# Patient Record
Sex: Male | Born: 1970 | Race: White | Hispanic: No | Marital: Married | Smoking: Never smoker
Health system: Southern US, Community
[De-identification: ages and names within clinical notes are randomized; demographics above are authoritative.]

## PROBLEM LIST (undated history)

## (undated) DIAGNOSIS — M1990 Unspecified osteoarthritis, unspecified site: Secondary | ICD-10-CM

## (undated) DIAGNOSIS — C801 Malignant (primary) neoplasm, unspecified: Secondary | ICD-10-CM

## (undated) DIAGNOSIS — I10 Essential (primary) hypertension: Secondary | ICD-10-CM

## (undated) DIAGNOSIS — E119 Type 2 diabetes mellitus without complications: Secondary | ICD-10-CM

## (undated) DIAGNOSIS — E7800 Pure hypercholesterolemia, unspecified: Secondary | ICD-10-CM

## (undated) DIAGNOSIS — Z809 Family history of malignant neoplasm, unspecified: Secondary | ICD-10-CM

## (undated) HISTORY — PX: HERNIA REPAIR: SHX51

## (undated) HISTORY — PX: TONSILLECTOMY: SUR1361

## (undated) HISTORY — PX: EYE SURGERY: SHX253

## (undated) HISTORY — DX: Malignant (primary) neoplasm, unspecified: C80.1

## (undated) HISTORY — DX: Family history of malignant neoplasm, unspecified: Z80.9

## (undated) HISTORY — PX: SMALL INTESTINE SURGERY: SHX150

## (undated) NOTE — *Deleted (*Deleted)
[facility]   [phone] Fax:([phone]   Clinic Follow up Note   Patient Care Team: Cain Saupe, MD as PCP - General (Family Medicine)  Date of Service:  05/19/2020  CHIEF COMPLAINT: F/u neuroendocrine tumor  SUMMARY OF ONCOLOGIC HISTORY: Oncology History Overview Note  Cancer Staging No matching staging information was found for the patient.    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 There is a solid mass in the right lobe of the liver which based on current measurements may have enlarged since the prior study from 2015. Images from the previous MR at that time cannot be retrieved. Given this circumstance, it may be prudent to correlate with pre and serial post-contrast MR or CT of the liver to further evaluate. There is underlying diffuse increase in liver echogenicity, a finding indicative of hepatic steatosis.   2. Multiple loops of fluid-filled bowel. Question a degree of ileus or enteritis.   3. Study otherwise unremarkable. Note that much of the common bile duct is obscured by gas.    06/11/2019 Imaging   CT AP W Contrast 06/11/19   IMPRESSION: 1. High-grade small bowel obstruction secondary to desmoplastic response in the central right abdominal mesentery centered on a 4 cm calcified irregular/spiculated soft tissue lesion. Abrupt small bowel transition zone is identified immediately lead adjacent to this mesenteric lesion and multiple adjacent bowel loops are tethered into this region with mesenteric edema/congestion. The loop of small bowel immediately proximal to the transition zone shows mild circumferential wall thickening but no pneumatosis. 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 2. Small lymph nodes in the abnormal right mesentery suggest additional  metastatic involvement. 3. Heterogeneous liver parenchyma, likely secondary to geographic fatty deposition. There is a focal 14 mm low-density lesion in the right liver. The patient had an MRI in the [facility] system on 08/09/2013 to evaluate a right liver lesion, but those images are not available. Follow-up MRI without and with contrast recommended to exclude metastatic disease. 4. Tiny sclerotic foci in the T12 vertebral body in both femoral heads are likely benign, but close attention on follow-up recommended. 5.  Aortic Atherosclerois (ICD10-170.0)      06/12/2019 Surgery    EXPLORATORY LAPAROTOMY WITH SMALL BOWEL RESECTION by Dr. 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 Interval resection small bowel and mesenteric mass with no evidence residual mesenteric or small bowel neuroendocrine tumor. 2. Large lesion in RIGHT hepatic lobe with intense radiotracer activity consistent with well differentiated neuroendocrine tumor metastasis. Smaller lesion in the anterior LEFT hepatic lobe is concerning for second hepatic metastasis. 3. Pulmonary nodule measuring 6 mm in LEFT upper lobe is not have radiotracer activity. Smaller RIGHT upper lobe nodule. Recommend close attention on follow-up.   08/01/2019 -  Chemotherapy   Lanreotide injection monthly starting 08/01/19    08/04/2019 Imaging  MRI abdomen  IMPRESSION: 1. The previously noted lesion in  the right lobe of the liver has grown slightly compared to 2015. In addition, today's study demonstrates 2 smaller lesions with similar imaging characteristics. Given the activity on the recent PET Dotatate scan, these lesions are all compatible with small neuroendocrine metastatic lesions. 2. Hepatic steatosis.   02/10/2020 Imaging   MRI abdomen  IMPRESSION: 1. The dominant right hepatic lobe mass has mildly increased in size compared to the prior exam. This currently measures 5.4 by 4.5 cm, previously 4.9 by 4.2 cm. 2. Nine additional small T2 hyperintense foci in the liver are stable, and while the small size makes these less specific, there likely small metastatic lesions. Today's exam has the benefit of less motion artifact compared to the 06/03/2020 MRI, and I was able to pick at each of these tiny lesions on the prior exam in retrospect. It is conceivable that 1 or more of these tiny lesions could represent small hemangiomas, although I do not observe classic delayed enhancement pattern. 3. Diffuse hepatic steatosis.      CURRENT THERAPY:  Lanreotide injection monthly starting 08/01/19  INTERVAL HISTORY: *** Dwayne Sawyer is here for a follow up. He presents to the clinic alone.    REVIEW OF SYSTEMS:  *** Constitutional: Denies fevers, chills or abnormal weight loss Eyes: Denies blurriness of vision Ears, nose, mouth, throat, and face: Denies mucositis or sore throat Respiratory: Denies cough, dyspnea or wheezes Cardiovascular: Denies palpitation, chest discomfort or lower extremity swelling Gastrointestinal:  Denies nausea, heartburn or change in bowel habits Skin: Denies abnormal skin rashes Lymphatics: Denies new lymphadenopathy or easy bruising Neurological:Denies numbness, tingling or new weaknesses Behavioral/Psych: Mood is stable, no new changes  All other systems were reviewed with the patient and are negative.  MEDICAL HISTORY:  Past Medical History:   Diagnosis Date  . Arthritis   . Family history of carcinoid tumor   . High cholesterol   . Hypertension     SURGICAL HISTORY: Past Surgical History:  Procedure Laterality Date  . HERNIA REPAIR    . LAPAROTOMY N/A 06/12/2019   Procedure: EXPLORATORY LAPAROTOMY WITH SMALL BOWEL RESECTION;  Surgeon: Luretha Murphy, MD;  Location: [facility];  Service: General;  Laterality: N/A;  . SHOULDER SURGERY      I have reviewed the social history and family history with the patient and they are unchanged from previous note.  ALLERGIES:  has No Known Allergies.  MEDICATIONS:  Current Outpatient Medications  Medication Sig Dispense Refill  . amLODipine (NORVASC) 10 MG tablet TAKE ONE TABLET BY MOUTH DAILY 90 tablet 1  . atorvastatin (LIPITOR) 10 MG tablet TAKE ONE TABLET BY MOUTH DAILY AT 6:00 IN THE EVENING 30 tablet 3  . carvedilol (COREG) 3.125 MG tablet Take 1 tablet (3.125 mg total) by mouth 2 (two) times daily with a meal. 60 tablet 0  . valsartan-hydrochlorothiazide (DIOVAN-HCT) 160-25 MG tablet TAKE ONE TABLET BY MOUTH DAILY 90 tablet 1   No current facility-administered medications for this visit.    PHYSICAL EXAMINATION: ECOG PERFORMANCE STATUS: {CHL ONC ECOG PS:[id number]}  There were no vitals filed for this visit. There were no vitals filed for this visit. *** GENERAL:alert, no distress and comfortable SKIN: skin color, texture, turgor are normal, no rashes or significant lesions EYES: normal, Conjunctiva are pink and non-injected, sclera clear {OROPHARYNX:no exudate, no erythema and lips, buccal mucosa, and tongue normal}  NECK: supple, thyroid normal size, non-tender, without nodularity LYMPH:  no palpable lymphadenopathy  in the cervical, axillary {or inguinal} LUNGS: clear to auscultation and percussion with normal breathing effort HEART: regular rate & rhythm and no murmurs and no lower extremity edema ABDOMEN:abdomen soft, non-tender and normal bowel sounds  Musculoskeletal:no cyanosis of digits and no clubbing  NEURO: alert & oriented x 3 with fluent speech, no focal motor/sensory deficits  LABORATORY DATA:  I have reviewed the data as listed CBC Latest Ref Rng & Units 05/12/2020 02/06/2020 10/24/2019  WBC 4.0 - 10.5 K/uL 7.2 8.1 7.6  Hemoglobin 13.0 - 17.0 g/dL 40.9 81.1 91.4  Hematocrit 39 - 52 % 42.6 43.9 44.8  Platelets 150 - 400 K/uL 284 339 324     CMP Latest Ref Rng & Units 05/12/2020 02/06/2020 11/14/2019  Glucose 70 - 99 mg/dL 782(N) 562(Z) 98  BUN 6 - 20 mg/dL 20 20 17   Creatinine 0.61 - 1.24 mg/dL 3.08 6.57(Q) 4.69(G)  Sodium 135 - 145 mmol/L 140 138 138  Potassium 3.5 - 5.1 mmol/L 4.3 3.8 4.1  Chloride 98 - 111 mmol/L 105 102 99  CO2 22 - 32 mmol/L 27 25 24   Calcium 8.9 - 10.3 mg/dL 9.3 9.9 9.8  Total Protein 6.5 - 8.1 g/dL 7.6 8.1 -  Total Bilirubin 0.3 - 1.2 mg/dL 0.4 0.6 -  Alkaline Phos 38 - 126 U/L 58 77 -  AST 15 - 41 U/L 16 15 -  ALT 0 - 44 U/L 29 26 -      RADIOGRAPHIC STUDIES: I have personally reviewed the radiological images as listed and agreed with the findings in the report. No results found.   ASSESSMENT & PLAN:  Tamas Suen is a 75 y.o. male with   1. 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 Pathology confirmed well differentiated neuroendocrine tumor, with metastasis to 14 of 32 LNs. Margins clear. -07/16/19 PETshowstworight hepatic lesion which areintensely hypermetabolic consistent with metastatic NET. There is also a smaller less avid left hepatic lesion that is concerning for metastasis which was not seen on CT from 06/11/19.No other evidence of mets -His MRI abdomen from 08/04/19 shows 3 liver lesions (0.8-4.4cm) compatible with neuroendocrine metastatic lesions.  -He was seen by Dr. Donell Beers and she did not recommend surgery at this time due to the multiple liver  mets.  -I started him on monthly Lanreotide injections on 08/01/19. Will continue injections to treat and control his disease. If he responds well he may be able to do liver ablation down the road. I briefly discussed option treatment of Lutathera as a later line of treatment.  -I personally reviewed and discussed the MRI abdomen from 02/10/20 which shows mild progression of right liver lobe lesions and stable 9 additional T2 hyperintense foci in the liver some could be benign lesions. I discussed there is still stable disease per radiographic criteria, although this likely represents mild preogression. I recommend repeating DOTATATE PET for further evaluation as the next scan in 3 months.  -With further disease progression, I will recommend next line treatment with monthly Lutathera treatments X4 then proceeding with observation, or afinitor. I discussed Lutathera is not curative, so with the following disease progression I will discuss oral target therapy options at that time.  -Labs reviewed and adequate to proceed with lanreotide injections today. Continue monthly -f/u in 3 months with scan results.   3. RA -previously on methotrexate and enbrel -not currently on therapy   4.  Mild Diarrhea  -Secondary to lanreotide injections  -Irfan has had loose mouse-like stool since surgery and has occasional diarrhea at least 3 times a day. Overall manageable.  -I recommend increased water intake and imodium if needed.   5. Uncontrolled hypertension, Hyperglycemia and CKD -Cr has been increasing, likely related to hyperglycemia and uncontrolled HTN.  -BG is 236 today and BP at 157/95 (02/13/20). I recommend following up with PCP for medication adjustment and to check A1c and Cholesterol as this can indicate Diabetes  6. Transgender and estrogen use -Trejan has started estrogen with Waldron Session, NP with goal of transgender transitioning.  -I checked his neuroendocrine ER and PR which were negative, so  there is no contraindication for estrogen use. Can continue    PLAN: -MRI scanreviewed -Proceed with injection today  -Lab and injection monthly X3 -f/u in 3 months with lab and DOTATATE PET a few days before  -will copy his PCP about his HTN issue    No problem-specific Assessment & Plan notes found for this encounter.   No orders of the defined types were placed in this encounter.  All questions were answered. The patient knows to call the clinic with any problems, questions or concerns. No barriers to learning was detected. The total time spent in the appointment was {CHL ONC TIME VISIT - [id number]}.     Delphina Cahill 05/19/2020   Rogelia Rohrer, am acting as scribe for Malachy Mood, MD.   {Add scribe attestation statement}

## (undated) NOTE — *Deleted (*Deleted)
[facility]   [phone] [phone]   Clinic Follow up Note   Patient Care Team: Cain Saupe, MD as PCP - General (Family Medicine)  Date of Service:  05/12/2020  CHIEF COMPLAINT: F/u neuroendocrine tumor  SUMMARY OF ONCOLOGIC HISTORY: Oncology History Overview Note  Cancer Staging No matching staging information was found for the patient.    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 There is a solid mass in the right lobe of the liver which based on current measurements may have enlarged since the prior study from 2015. Images from the previous MR at that time cannot be retrieved. Given this circumstance, it may be prudent to correlate with pre and serial post-contrast MR or CT of the liver to further evaluate. There is underlying diffuse increase in liver echogenicity, a finding indicative of hepatic steatosis.   2. Multiple loops of fluid-filled bowel. Question a degree of ileus or enteritis.   3. Study otherwise unremarkable. Note that much of the common bile duct is obscured by gas.    06/11/2019 Imaging   CT AP W Contrast 06/11/19   IMPRESSION: 1. High-grade small bowel obstruction secondary to desmoplastic response in the central right abdominal mesentery centered on a 4 cm calcified irregular/spiculated soft tissue lesion. Abrupt small bowel transition zone is identified immediately lead adjacent to this mesenteric lesion and multiple adjacent bowel loops are tethered into this region with mesenteric edema/congestion. The loop of small bowel immediately proximal to the transition zone shows mild circumferential wall thickening but no pneumatosis. 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 2. Small lymph nodes in the abnormal right mesentery suggest additional  metastatic involvement. 3. Heterogeneous liver parenchyma, likely secondary to geographic fatty deposition. There is a focal 14 mm low-density lesion in the right liver. The patient had an MRI in the [facility] system on 08/09/2013 to evaluate a right liver lesion, but those images are not available. Follow-up MRI without and with contrast recommended to exclude metastatic disease. 4. Tiny sclerotic foci in the T12 vertebral body in both femoral heads are likely benign, but close attention on follow-up recommended. 5.  Aortic Atherosclerois (ICD10-170.0)      06/12/2019 Surgery    EXPLORATORY LAPAROTOMY WITH SMALL BOWEL RESECTION by Dr. 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 Interval resection small bowel and mesenteric mass with no evidence residual mesenteric or small bowel neuroendocrine tumor. 2. Large lesion in RIGHT hepatic lobe with intense radiotracer activity consistent with well differentiated neuroendocrine tumor metastasis. Smaller lesion in the anterior LEFT hepatic lobe is concerning for second hepatic metastasis. 3. Pulmonary nodule measuring 6 mm in LEFT upper lobe is not have radiotracer activity. Smaller RIGHT upper lobe nodule. Recommend close attention on follow-up.   08/01/2019 -  Chemotherapy   Lanreotide injection monthly starting 08/01/19    08/04/2019 Imaging  MRI abdomen  IMPRESSION: 1. The previously noted lesion in  the right lobe of the liver has grown slightly compared to 2015. In addition, today's study demonstrates 2 smaller lesions with similar imaging characteristics. Given the activity on the recent PET Dotatate scan, these lesions are all compatible with small neuroendocrine metastatic lesions. 2. Hepatic steatosis.   02/10/2020 Imaging   MRI abdomen  IMPRESSION: 1. The dominant right hepatic lobe mass has mildly increased in size compared to the prior exam. This currently measures 5.4 by 4.5 cm, previously 4.9 by 4.2 cm. 2. Nine additional small T2 hyperintense foci in the liver are stable, and while the small size makes these less specific, there likely small metastatic lesions. Today's exam has the benefit of less motion artifact compared to the 06/03/2020 MRI, and I was able to pick at each of these tiny lesions on the prior exam in retrospect. It is conceivable that 1 or more of these tiny lesions could represent small hemangiomas, although I do not observe classic delayed enhancement pattern. 3. Diffuse hepatic steatosis.      CURRENT THERAPY:  Lanreotide injection monthly starting 08/01/19  INTERVAL HISTORY: *** [name] is here for a follow up. He presents to the clinic alone.    REVIEW OF SYSTEMS:  *** Constitutional: Denies fevers, chills or abnormal weight loss Eyes: Denies blurriness of vision Ears, nose, mouth, throat, and face: Denies mucositis or sore throat Respiratory: Denies cough, dyspnea or wheezes Cardiovascular: Denies palpitation, chest discomfort or lower extremity swelling Gastrointestinal:  Denies nausea, heartburn or change in bowel habits Skin: Denies abnormal skin rashes Lymphatics: Denies new lymphadenopathy or easy bruising Neurological:Denies numbness, tingling or new weaknesses Behavioral/Psych: Mood is stable, no new changes  All other systems were reviewed with the patient and are negative.  MEDICAL HISTORY:  Past Medical History:   Diagnosis Date  . Arthritis   . Family history of carcinoid tumor   . High cholesterol   . Hypertension     SURGICAL HISTORY: Past Surgical History:  Procedure Laterality Date  . HERNIA REPAIR    . LAPAROTOMY N/A 06/12/2019   Procedure: EXPLORATORY LAPAROTOMY WITH SMALL BOWEL RESECTION;  Surgeon: Luretha Murphy, MD;  Location: [facility];  Service: General;  Laterality: N/A;  . SHOULDER SURGERY      I have reviewed the social history and family history with the patient and they are unchanged from previous note.  ALLERGIES:  has No Known Allergies.  MEDICATIONS:  Current Outpatient Medications  Medication Sig Dispense Refill  . amLODipine (NORVASC) 10 MG tablet TAKE ONE TABLET BY MOUTH DAILY 90 tablet 1  . atorvastatin (LIPITOR) 10 MG tablet TAKE ONE TABLET BY MOUTH DAILY AT 6:00 IN THE EVENING 30 tablet 3  . carvedilol (COREG) 3.125 MG tablet Take 1 tablet (3.125 mg total) by mouth 2 (two) times daily with a meal. 60 tablet 0  . valsartan-hydrochlorothiazide (DIOVAN-HCT) 160-25 MG tablet TAKE ONE TABLET BY MOUTH DAILY 90 tablet 1   No current facility-administered medications for this visit.    PHYSICAL EXAMINATION: ECOG PERFORMANCE STATUS: {CHL ONC ECOG PS:[id number]}  There were no vitals filed for this visit. There were no vitals filed for this visit. *** GENERAL:alert, no distress and comfortable SKIN: skin color, texture, turgor are normal, no rashes or significant lesions EYES: normal, Conjunctiva are pink and non-injected, sclera clear {OROPHARYNX:no exudate, no erythema and lips, buccal mucosa, and tongue normal}  NECK: supple, thyroid normal size, non-tender, without nodularity LYMPH:  no palpable lymphadenopathy  in the cervical, axillary {or inguinal} LUNGS: clear to auscultation and percussion with normal breathing effort HEART: regular rate & rhythm and no murmurs and no lower extremity edema ABDOMEN:abdomen soft, non-tender and normal bowel sounds  Musculoskeletal:no cyanosis of digits and no clubbing  NEURO: alert & oriented x 3 with fluent speech, no focal motor/sensory deficits  LABORATORY DATA:  I have reviewed the data as listed CBC Latest Ref Rng & Units 05/12/2020 02/06/2020 10/24/2019  WBC 4.0 - 10.5 K/uL 7.2 8.1 7.6  Hemoglobin 13.0 - 17.0 g/dL 30.8 65.7 84.6  Hematocrit 39 - 52 % 42.6 43.9 44.8  Platelets 150 - 400 K/uL 284 339 324     CMP Latest Ref Rng & Units 05/12/2020 02/06/2020 11/14/2019  Glucose 70 - 99 mg/dL 962(X) 528(U) 98  BUN 6 - 20 mg/dL 20 20 17   Creatinine 0.61 - 1.24 mg/dL 1.32 4.40(N) 0.27(O)  Sodium 135 - 145 mmol/L 140 138 138  Potassium 3.5 - 5.1 mmol/L 4.3 3.8 4.1  Chloride 98 - 111 mmol/L 105 102 99  CO2 22 - 32 mmol/L 27 25 24   Calcium 8.9 - 10.3 mg/dL 9.3 9.9 9.8  Total Protein 6.5 - 8.1 g/dL 7.6 8.1 -  Total Bilirubin 0.3 - 1.2 mg/dL 0.4 0.6 -  Alkaline Phos 38 - 126 U/L 58 77 -  AST 15 - 41 U/L 16 15 -  ALT 0 - 44 U/L 29 26 -      RADIOGRAPHIC STUDIES: I have personally reviewed the radiological images as listed and agreed with the findings in the report. No results found.   ASSESSMENT & PLAN:  Dwayne Sawyer is a 60 y.o. male with   1. 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 Pathology confirmed well differentiated neuroendocrine tumor, with metastasis to 14 of 32 LNs. Margins clear. -07/16/19 PETshowstworight hepatic lesion which areintensely hypermetabolic consistent with metastatic NET. There is also a smaller less avid left hepatic lesion that is concerning for metastasis which was not seen on CT from 06/11/19.No other evidence of mets -His MRI abdomen from 08/04/19 shows 3 liver lesions (0.8-4.4cm) compatible with neuroendocrine metastatic lesions.  -He was seen by Dr. Donell Beers and she did not recommend surgery at this time due to the multiple liver  mets.  -I started him on monthly Lanreotide injections on 08/01/19. Will continue injections to treat and control his disease. If he responds well he may be able to do liver ablation down the road. I briefly discussed option treatment of Lutathera as a later line of treatment.  -I personally reviewed and discussed the MRI abdomen from 02/10/20 which shows mild progression of right liver lobe lesions and stable 9 additional T2 hyperintense foci in the liver some could be benign lesions. I discussed there is still stable disease per radiographic criteria, although this likely represents mild preogression. I recommend repeating DOTATATE PET for further evaluation as the next scan in 3 months.  -With further disease progression, I will recommend next line treatment with monthly Lutathera treatments X4 then proceeding with observation, or afinitor. I discussed Lutathera is not curative, so with the following disease progression I will discuss oral target therapy options at that time.  -Labs reviewed and adequate to proceed with lanreotide injections today. Continue monthly -f/u in 3 months with scan results.   3. RA -previously on methotrexate and enbrel -not currently on therapy   4.  Mild Diarrhea  -Secondary to lanreotide injections  -Dedric has had loose mouse-like stool since surgery and has occasional diarrhea at least 3 times a day. Overall manageable.  -I recommend increased water intake and imodium if needed.   5. Uncontrolled hypertension, Hyperglycemia and CKD -Cr has been increasing, likely related to hyperglycemia and uncontrolled HTN.  -BG is 236 today and BP at 157/95 (02/13/20). I recommend following up with PCP for medication adjustment and to check A1c and Cholesterol as this can indicate Diabetes  6. Transgender and estrogen use -Altan has started estrogen with Waldron Session, NP with goal of transgender transitioning.  -I checked his neuroendocrine ER and PR which were negative, so  there is no contraindication for estrogen use. Can continue    PLAN: -MRI scanreviewed -Proceed with injection today  -Lab and injection monthly X3 -f/u in 3 months with lab and DOTATATE PET a few days before  -will copy his PCP about his HTN issue     No problem-specific Assessment & Plan notes found for this encounter.   No orders of the defined types were placed in this encounter.  All questions were answered. The patient knows to call the clinic with any problems, questions or concerns. No barriers to learning was detected. The total time spent in the appointment was {CHL ONC TIME VISIT - YNWGN:[id number]}.     Delphina Cahill 05/12/2020   Rogelia Rohrer, am acting as scribe for Malachy Mood, MD.   {Add scribe attestation statement}

---
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
## 1999-07-25 HISTORY — PX: SHOULDER SURGERY: SHX246

## 2002-03-20 ENCOUNTER — Encounter: Payer: Self-pay | Admitting: Emergency Medicine

## 2002-03-20 ENCOUNTER — Emergency Department (HOSPITAL_COMMUNITY): Admission: EM | Admit: 2002-03-20 | Discharge: 2002-03-20 | Payer: Self-pay | Admitting: Emergency Medicine

## 2015-10-13 DIAGNOSIS — M0579 Rheumatoid arthritis with rheumatoid factor of multiple sites without organ or systems involvement: Secondary | ICD-10-CM | POA: Insufficient documentation

## 2015-10-13 DIAGNOSIS — K769 Liver disease, unspecified: Secondary | ICD-10-CM | POA: Insufficient documentation

## 2018-07-24 DIAGNOSIS — C801 Malignant (primary) neoplasm, unspecified: Secondary | ICD-10-CM

## 2018-07-24 HISTORY — DX: Malignant (primary) neoplasm, unspecified: C80.1

## 2019-06-11 ENCOUNTER — Inpatient Hospital Stay (HOSPITAL_COMMUNITY): Payer: Self-pay

## 2019-06-11 ENCOUNTER — Emergency Department (HOSPITAL_BASED_OUTPATIENT_CLINIC_OR_DEPARTMENT_OTHER): Payer: Self-pay

## 2019-06-11 ENCOUNTER — Other Ambulatory Visit: Payer: Self-pay

## 2019-06-11 ENCOUNTER — Encounter (HOSPITAL_BASED_OUTPATIENT_CLINIC_OR_DEPARTMENT_OTHER): Payer: Self-pay | Admitting: Emergency Medicine

## 2019-06-11 ENCOUNTER — Inpatient Hospital Stay (HOSPITAL_BASED_OUTPATIENT_CLINIC_OR_DEPARTMENT_OTHER)
Admission: EM | Admit: 2019-06-11 | Discharge: 2019-06-15 | DRG: 330 | Disposition: A | Discharge status: Home / Self Care | Payer: Self-pay | Attending: Family Medicine | Admitting: Family Medicine

## 2019-06-11 DIAGNOSIS — Z9114 Patient's other noncompliance with medication regimen: Secondary | ICD-10-CM

## 2019-06-11 DIAGNOSIS — K565 Intestinal adhesions [bands], unspecified as to partial versus complete obstruction: Secondary | ICD-10-CM | POA: Diagnosis present

## 2019-06-11 DIAGNOSIS — C800 Disseminated malignant neoplasm, unspecified: Secondary | ICD-10-CM | POA: Diagnosis present

## 2019-06-11 DIAGNOSIS — D72825 Bandemia: Secondary | ICD-10-CM | POA: Diagnosis not present

## 2019-06-11 DIAGNOSIS — Z20828 Contact with and (suspected) exposure to other viral communicable diseases: Secondary | ICD-10-CM | POA: Diagnosis present

## 2019-06-11 DIAGNOSIS — E785 Hyperlipidemia, unspecified: Secondary | ICD-10-CM | POA: Diagnosis present

## 2019-06-11 DIAGNOSIS — M1990 Unspecified osteoarthritis, unspecified site: Secondary | ICD-10-CM | POA: Diagnosis present

## 2019-06-11 DIAGNOSIS — E860 Dehydration: Secondary | ICD-10-CM | POA: Diagnosis present

## 2019-06-11 DIAGNOSIS — K56699 Other intestinal obstruction unspecified as to partial versus complete obstruction: Secondary | ICD-10-CM | POA: Diagnosis present

## 2019-06-11 DIAGNOSIS — Z79899 Other long term (current) drug therapy: Secondary | ICD-10-CM

## 2019-06-11 DIAGNOSIS — K4090 Unilateral inguinal hernia, without obstruction or gangrene, not specified as recurrent: Secondary | ICD-10-CM | POA: Diagnosis present

## 2019-06-11 DIAGNOSIS — M899 Disorder of bone, unspecified: Secondary | ICD-10-CM

## 2019-06-11 DIAGNOSIS — E876 Hypokalemia: Secondary | ICD-10-CM | POA: Diagnosis present

## 2019-06-11 DIAGNOSIS — Z0189 Encounter for other specified special examinations: Secondary | ICD-10-CM

## 2019-06-11 DIAGNOSIS — R7989 Other specified abnormal findings of blood chemistry: Secondary | ICD-10-CM

## 2019-06-11 DIAGNOSIS — M069 Rheumatoid arthritis, unspecified: Secondary | ICD-10-CM | POA: Diagnosis present

## 2019-06-11 DIAGNOSIS — D3A098 Benign carcinoid tumors of other sites: Secondary | ICD-10-CM

## 2019-06-11 DIAGNOSIS — C787 Secondary malignant neoplasm of liver and intrahepatic bile duct: Secondary | ICD-10-CM | POA: Diagnosis present

## 2019-06-11 DIAGNOSIS — Z720 Tobacco use: Secondary | ICD-10-CM

## 2019-06-11 DIAGNOSIS — I10 Essential (primary) hypertension: Secondary | ICD-10-CM | POA: Diagnosis present

## 2019-06-11 DIAGNOSIS — E861 Hypovolemia: Secondary | ICD-10-CM | POA: Diagnosis not present

## 2019-06-11 DIAGNOSIS — E7800 Pure hypercholesterolemia, unspecified: Secondary | ICD-10-CM | POA: Diagnosis present

## 2019-06-11 DIAGNOSIS — K760 Fatty (change of) liver, not elsewhere classified: Secondary | ICD-10-CM | POA: Diagnosis present

## 2019-06-11 DIAGNOSIS — Z8739 Personal history of other diseases of the musculoskeletal system and connective tissue: Secondary | ICD-10-CM

## 2019-06-11 DIAGNOSIS — C772 Secondary and unspecified malignant neoplasm of intra-abdominal lymph nodes: Secondary | ICD-10-CM | POA: Diagnosis present

## 2019-06-11 DIAGNOSIS — K5652 Intestinal adhesions [bands] with complete obstruction: Secondary | ICD-10-CM

## 2019-06-11 DIAGNOSIS — C7A011 Malignant carcinoid tumor of the jejunum: Principal | ICD-10-CM | POA: Diagnosis present

## 2019-06-11 DIAGNOSIS — R824 Acetonuria: Secondary | ICD-10-CM | POA: Diagnosis present

## 2019-06-11 DIAGNOSIS — D751 Secondary polycythemia: Secondary | ICD-10-CM | POA: Diagnosis present

## 2019-06-11 DIAGNOSIS — R609 Edema, unspecified: Secondary | ICD-10-CM | POA: Diagnosis present

## 2019-06-11 DIAGNOSIS — N179 Acute kidney failure, unspecified: Secondary | ICD-10-CM | POA: Diagnosis present

## 2019-06-11 DIAGNOSIS — D3A019 Benign carcinoid tumor of the small intestine, unspecified portion: Secondary | ICD-10-CM

## 2019-06-11 DIAGNOSIS — R402350 Coma scale, best motor response, localizes pain, unspecified time: Secondary | ICD-10-CM | POA: Diagnosis present

## 2019-06-11 DIAGNOSIS — R197 Diarrhea, unspecified: Secondary | ICD-10-CM | POA: Diagnosis present

## 2019-06-11 DIAGNOSIS — K56609 Unspecified intestinal obstruction, unspecified as to partial versus complete obstruction: Secondary | ICD-10-CM | POA: Diagnosis present

## 2019-06-11 DIAGNOSIS — R160 Hepatomegaly, not elsewhere classified: Secondary | ICD-10-CM

## 2019-06-11 DIAGNOSIS — E871 Hypo-osmolality and hyponatremia: Secondary | ICD-10-CM | POA: Diagnosis present

## 2019-06-11 DIAGNOSIS — I160 Hypertensive urgency: Secondary | ICD-10-CM | POA: Diagnosis present

## 2019-06-11 DIAGNOSIS — K6389 Other specified diseases of intestine: Secondary | ICD-10-CM

## 2019-06-11 DIAGNOSIS — R402140 Coma scale, eyes open, spontaneous, unspecified time: Secondary | ICD-10-CM | POA: Diagnosis present

## 2019-06-11 DIAGNOSIS — R402250 Coma scale, best verbal response, oriented, unspecified time: Secondary | ICD-10-CM | POA: Diagnosis present

## 2019-06-11 HISTORY — DX: Unspecified osteoarthritis, unspecified site: M19.90

## 2019-06-11 HISTORY — DX: Pure hypercholesterolemia, unspecified: E78.00

## 2019-06-11 HISTORY — DX: Essential (primary) hypertension: I10

## 2019-06-11 LAB — COMPREHENSIVE METABOLIC PANEL
ALT: 25 U/L (ref 0–44)
AST: 19 U/L (ref 15–41)
Albumin: 4.2 g/dL (ref 3.5–5.0)
Alkaline Phosphatase: 123 U/L (ref 38–126)
Anion gap: 13 (ref 5–15)
BUN: 16 mg/dL (ref 6–20)
CO2: 27 mmol/L (ref 22–32)
Calcium: 9 mg/dL (ref 8.9–10.3)
Chloride: 97 mmol/L — ABNORMAL LOW (ref 98–111)
Creatinine, Ser: 1.62 mg/dL — ABNORMAL HIGH (ref 0.61–1.24)
GFR calc Af Amer: 57 mL/min — ABNORMAL LOW (ref >60)
GFR calc non Af Amer: 49 mL/min — ABNORMAL LOW (ref >60)
Glucose, Bld: 109 mg/dL — ABNORMAL HIGH (ref 70–99)
Potassium: 3 mmol/L — ABNORMAL LOW (ref 3.5–5.1)
Sodium: 137 mmol/L (ref 135–145)
Total Bilirubin: 1.1 mg/dL (ref 0.3–1.2)
Total Protein: 7.9 g/dL (ref 6.5–8.1)

## 2019-06-11 LAB — URINALYSIS, MICROSCOPIC (REFLEX)

## 2019-06-11 LAB — URINALYSIS, ROUTINE W REFLEX MICROSCOPIC
Glucose, UA: NEGATIVE mg/dL
Hgb urine dipstick: NEGATIVE
Ketones, ur: 40 mg/dL — AB
Leukocytes,Ua: NEGATIVE
Nitrite: NEGATIVE
Protein, ur: 30 mg/dL — AB
Specific Gravity, Urine: 1.025 (ref 1.005–1.030)
pH: 6 (ref 5.0–8.0)

## 2019-06-11 LAB — CBC
HCT: 50.7 % (ref 39.0–52.0)
Hemoglobin: 16.4 g/dL (ref 13.0–17.0)
MCH: 27.2 pg (ref 26.0–34.0)
MCHC: 32.3 g/dL (ref 30.0–36.0)
MCV: 84.1 fL (ref 80.0–100.0)
Platelets: 402 10*3/uL — ABNORMAL HIGH (ref 150–400)
RBC: 6.03 MIL/uL — ABNORMAL HIGH (ref 4.22–5.81)
RDW: 13.2 % (ref 11.5–15.5)
WBC: 8 10*3/uL (ref 4.0–10.5)
nRBC: 0 % (ref 0.0–0.2)

## 2019-06-11 LAB — CBC WITH DIFFERENTIAL/PLATELET
Abs Immature Granulocytes: 0.02 10*3/uL (ref 0.00–0.07)
Basophils Absolute: 0.1 10*3/uL (ref 0.0–0.1)
Basophils Relative: 1 %
Eosinophils Absolute: 0.1 10*3/uL (ref 0.0–0.5)
Eosinophils Relative: 1 %
HCT: 52.2 % — ABNORMAL HIGH (ref 39.0–52.0)
Hemoglobin: 17 g/dL (ref 13.0–17.0)
Immature Granulocytes: 0 %
Lymphocytes Relative: 14 %
Lymphs Abs: 1.1 10*3/uL (ref 0.7–4.0)
MCH: 27.3 pg (ref 26.0–34.0)
MCHC: 32.6 g/dL (ref 30.0–36.0)
MCV: 83.8 fL (ref 80.0–100.0)
Monocytes Absolute: 0.7 10*3/uL (ref 0.1–1.0)
Monocytes Relative: 9 %
Neutro Abs: 5.9 10*3/uL (ref 1.7–7.7)
Neutrophils Relative %: 75 %
Platelets: 396 10*3/uL (ref 150–400)
RBC: 6.23 MIL/uL — ABNORMAL HIGH (ref 4.22–5.81)
RDW: 13.2 % (ref 11.5–15.5)
WBC: 7.8 10*3/uL (ref 4.0–10.5)
nRBC: 0 % (ref 0.0–0.2)

## 2019-06-11 LAB — CREATININE, SERUM
Creatinine, Ser: 1.45 mg/dL — ABNORMAL HIGH (ref 0.61–1.24)
GFR calc Af Amer: >60 mL/min (ref >60)
GFR calc non Af Amer: 56 mL/min — ABNORMAL LOW (ref >60)

## 2019-06-11 LAB — SARS CORONAVIRUS 2 (TAT 6-24 HRS): SARS Coronavirus 2: NEGATIVE

## 2019-06-11 LAB — HIV ANTIBODY (ROUTINE TESTING W REFLEX): HIV Screen 4th Generation wRfx: NONREACTIVE

## 2019-06-11 LAB — LIPASE, BLOOD: Lipase: 31 U/L (ref 11–51)

## 2019-06-11 IMAGING — CT CT ABD-PELV W/ CM
2 of 5 series · 14 of 46 positions shown, 16 images · IV contrast (Omnipaque)
Comparison: None.

CLINICAL DATA: Right upper quadrant pain and vomiting.

EXAM:
CT ABDOMEN AND PELVIS WITH CONTRAST
TECHNIQUE: Multidetector CT imaging of the abdomen and pelvis was performed
using the standard protocol following bolus administration of
intravenous contrast.
CONTRAST:  100mL OMNIPAQUE IOHEXOL 300 MG/ML  SOLN

[Series 2: axial st · axial · 0.91mm/px · z∈[+553,+983]mm · 11 of 98 slices shown, 13 images]
[im 6/98  soft-tissue]
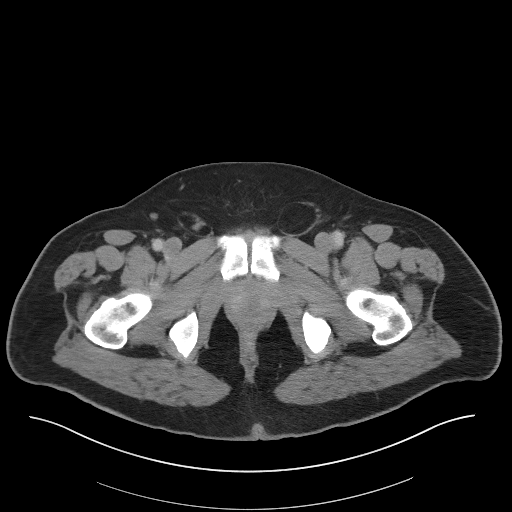
[im 6/98  bone]
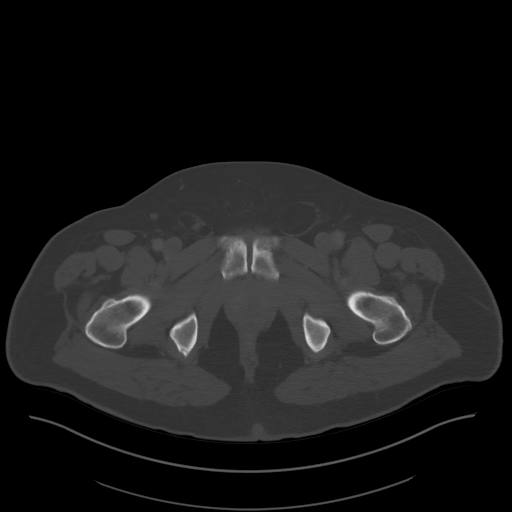
[im 17/98  soft-tissue]
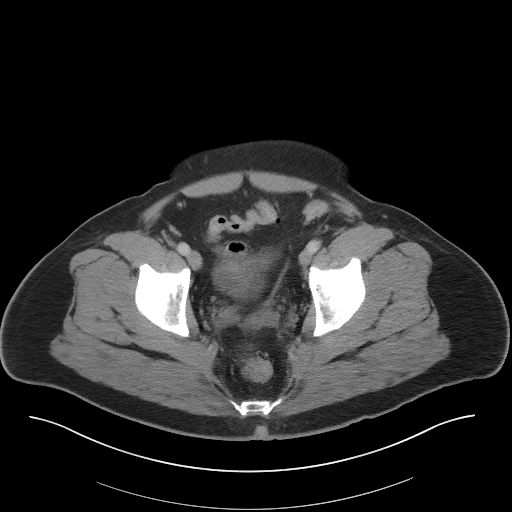
[im 22/98  soft-tissue]
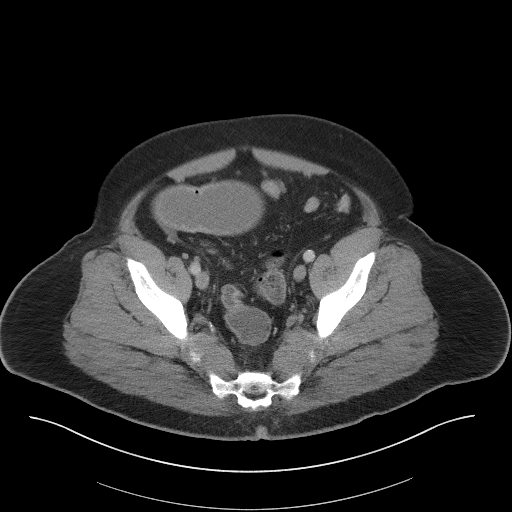
[im 33/98  soft-tissue]
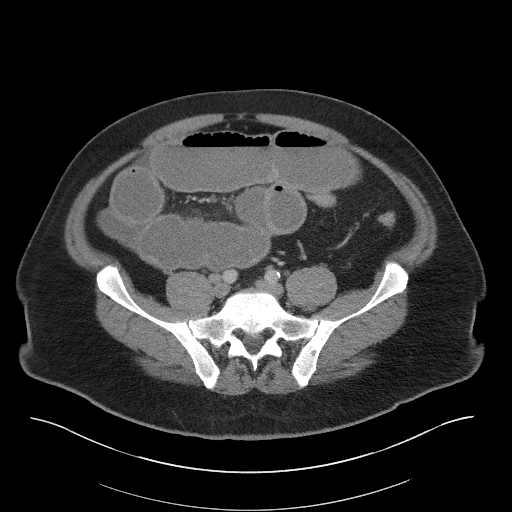
[im 38/98  soft-tissue]
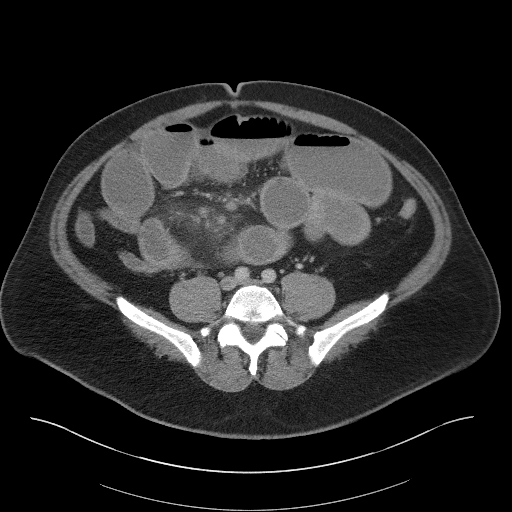
[im 49/98  soft-tissue]
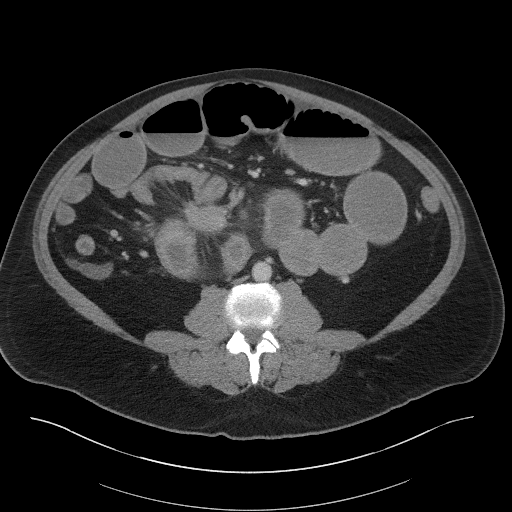
[im 60/98  soft-tissue]
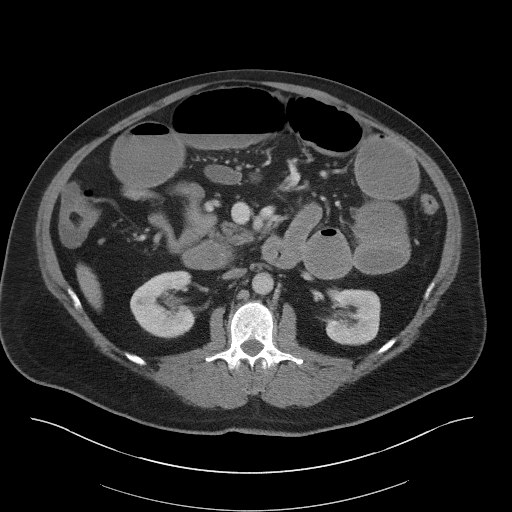
[im 65/98  soft-tissue]
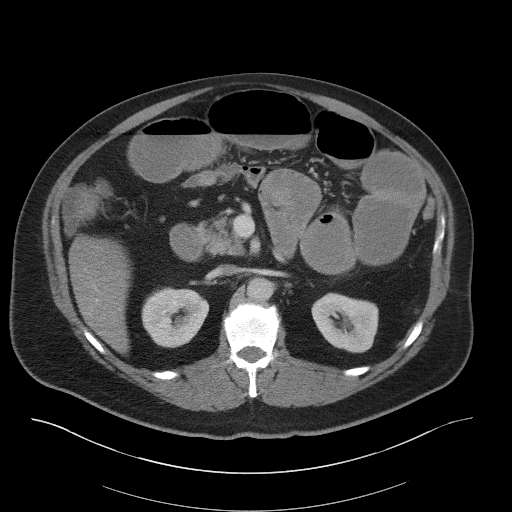
[im 76/98  soft-tissue]
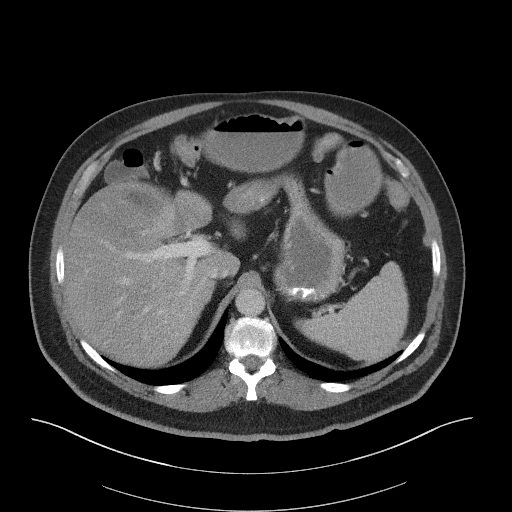
[im 76/98  bone]
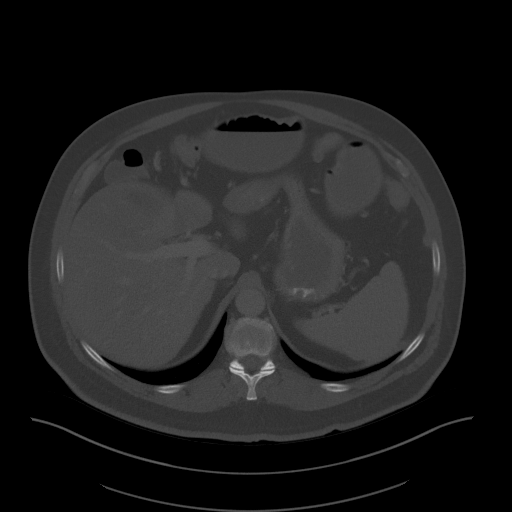
[im 81/98  soft-tissue]
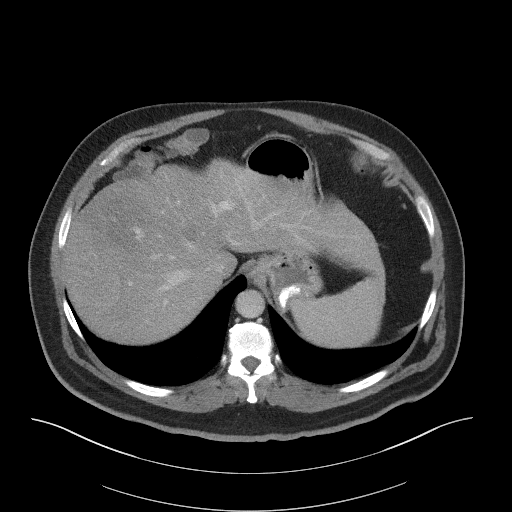
[im 92/98  soft-tissue]
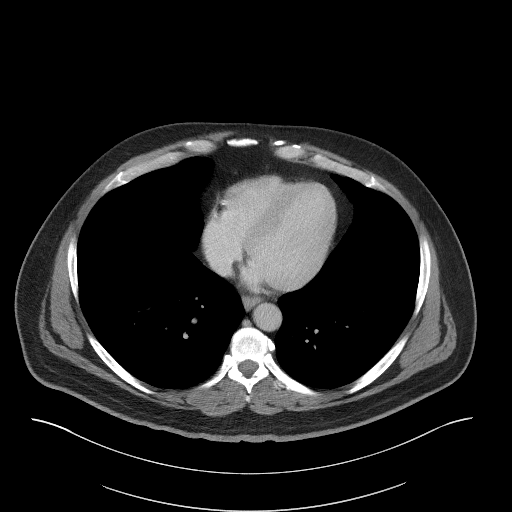

[Series 5: coronal st · coronal · 0.85mm/px · 3 of 115 slices shown]
[im 39/115  soft-tissue]
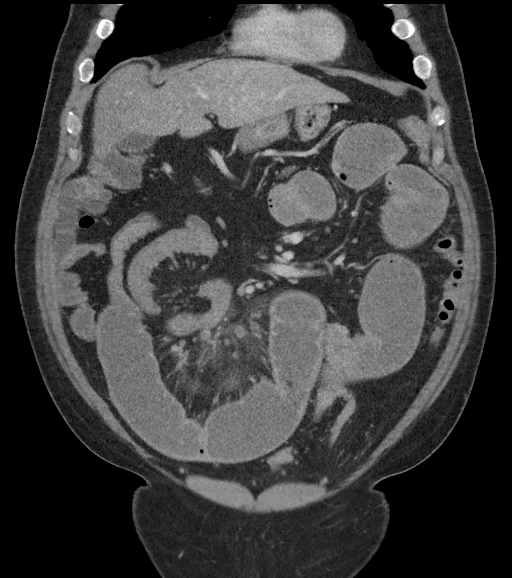
[im 51/115  soft-tissue]
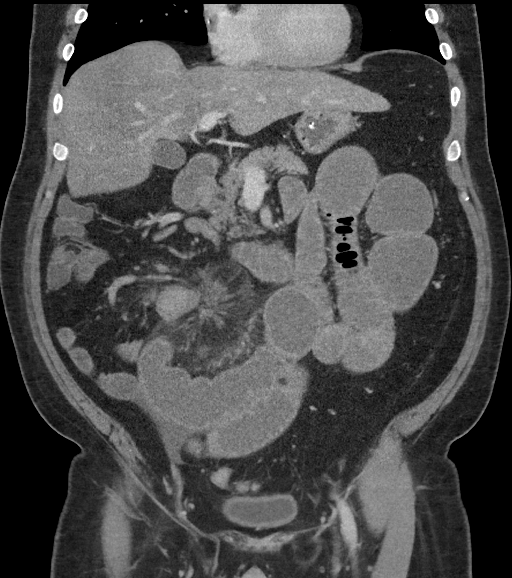
[im 64/115  soft-tissue]
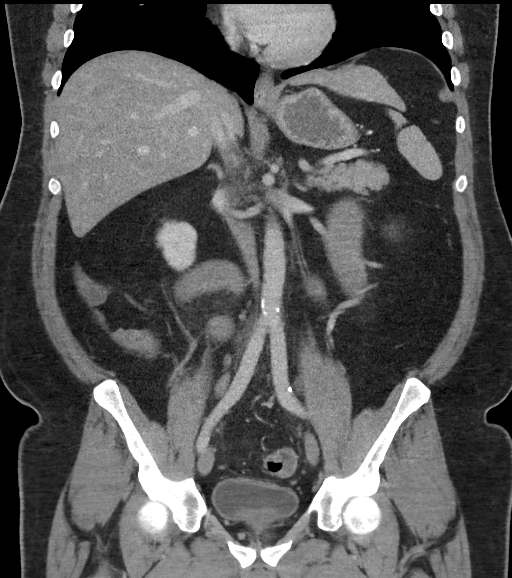

[14 of 46 positions shown; findings below may reference images not displayed]

FINDINGS: Lower chest:

Unremarkable.

Hepatobiliary: Geographic low attenuation within the liver
parenchyma is probably fatty deposition. Focal 14 mm low-density
lesion identified in the right liver on [DATE]. There is no evidence
for gallstones, gallbladder wall thickening, or pericholecystic
fluid. No intrahepatic or extrahepatic biliary dilation.

Pancreas: No focal mass lesion. No dilatation of the main duct. No
intraparenchymal cyst. No peripancreatic edema.

Spleen: No splenomegaly. No focal mass lesion.

Adrenals/Urinary Tract: No adrenal nodule or mass. 4 mm hypodensity
in the interpolar right kidney is too small to characterize 10 mm
low-density cortical lesion noted upper pole left kidney with
another 6 mm hypodensity in the interpolar region of the left
kidney. No evidence for hydroureter. The urinary bladder appears
normal for the degree of distention.

Stomach/Bowel: Stomach is unremarkable. No gastric wall thickening.
No evidence of outlet obstruction. Duodenum is normally positioned
as is the ligament of Treitz.

There is marked small-bowel obstruction with mid to distal small
bowel measuring up to 5.8 cm diameter. These dilated bowel loops are
fluid-filled. In the right abdomen, the abnormal bowel gradually
tapers and begins demonstrating circumferential wall thickening. The
abnormal small bowel demonstrates an abrupt transition (visible on
axial 53/2 and coronal 41/5) immediately adjacent to central
mesenteric desmoplastic response. This desmoplasia is centered on a
3.8 x 2.3 x 2.8 cm mesenteric soft tissue lesion with several
associated punctate calcifications. Small bowel in this region is
tethered to the mesenteric nodule in there are numerous adjacent
small mesenteric lymph nodes.

The distal and terminal ileum is decompressed. Colon is diffusely
decompressed.

Vascular/Lymphatic: There is abdominal aortic atherosclerosis
without aneurysm. No gastrohepatic or hepato duodenal ligament
lymphadenopathy. As above, numerous small nodes are seen in the
mesentery of the right abdomen. No pelvic sidewall lymphadenopathy.

Reproductive: Prostate gland is mildly enlarged.

Other: Small volume interloop mesenteric fluid noted without
substantial free fluid in the pelvis.

Musculoskeletal: 6 mm sclerotic focus noted in the T12 vertebral
body. Tiny sclerotic foci seen in each femoral head. Small bilateral
groin hernias contain only fat.
IMPRESSION: 1. High-grade small bowel obstruction secondary to desmoplastic
response in the central right abdominal mesentery centered on a 4 cm
calcified irregular/spiculated soft tissue lesion. Abrupt small
bowel transition zone is identified immediately lead adjacent to
this mesenteric lesion and multiple adjacent bowel loops are
tethered into this region with mesenteric edema/congestion. The loop
of small bowel immediately proximal to the transition zone shows
mild circumferential wall thickening but no pneumatosis. Imaging
features are highly suggestive of metastatic small-bowel carcinoid
tumor with small bowel obstruction.
2. Small lymph nodes in the abnormal right mesentery suggest
additional metastatic involvement.
3. Heterogeneous liver parenchyma, likely secondary to geographic
fatty deposition. There is a focal 14 mm low-density lesion in the
right liver. The patient had an MRI in the [REDACTED]
system on [DATE] to evaluate a right liver lesion, but those
images are not available. Follow-up MRI without and with contrast
recommended to exclude metastatic disease.
4. Tiny sclerotic foci in the T12 vertebral body in both femoral
heads are likely benign, but close attention on follow-up
recommended.
5.  Aortic Atherosclerois ([NB]-170.0)

## 2019-06-11 IMAGING — US US ABDOMEN LIMITED
1 series · 13 of 25 positions shown · non-contrast
Comparison: Report of prior abdominal MRI [DATE]
available; images from that study cannot be retrieved.

CLINICAL DATA: Upper abdominal pain

EXAM:
ULTRASOUND ABDOMEN LIMITED RIGHT UPPER QUADRANT

[Series 1: us abdomen limited · 13 of 86 slices shown]
[im 1/86]
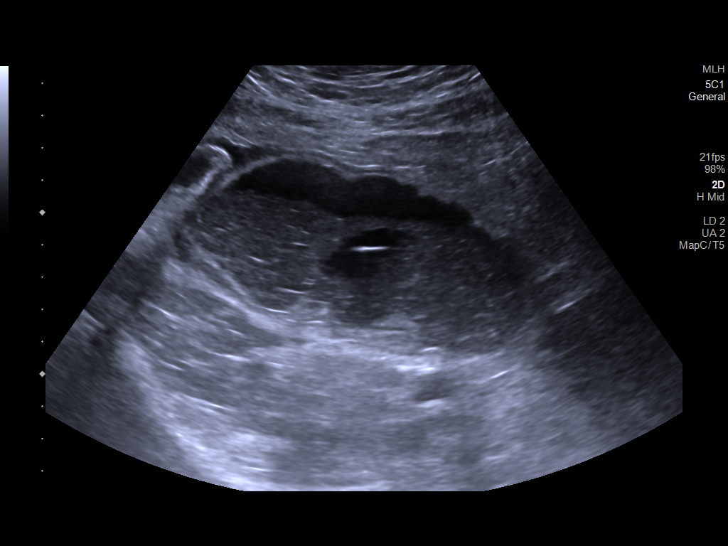
[im 8/86]
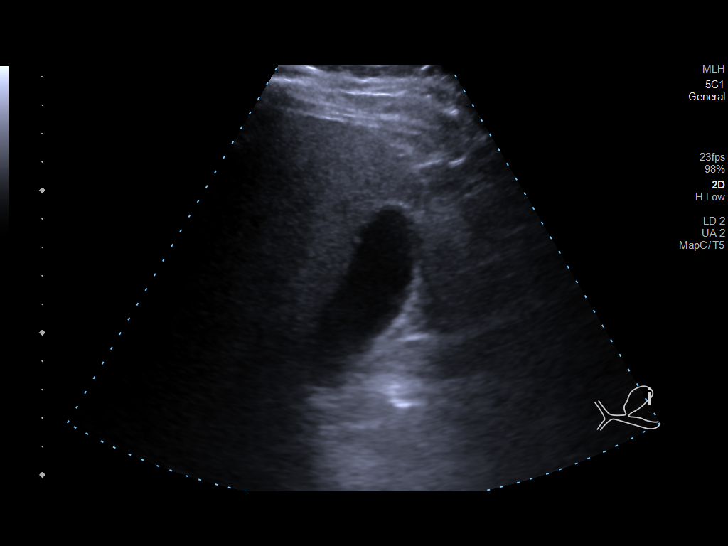
[im 15/86]
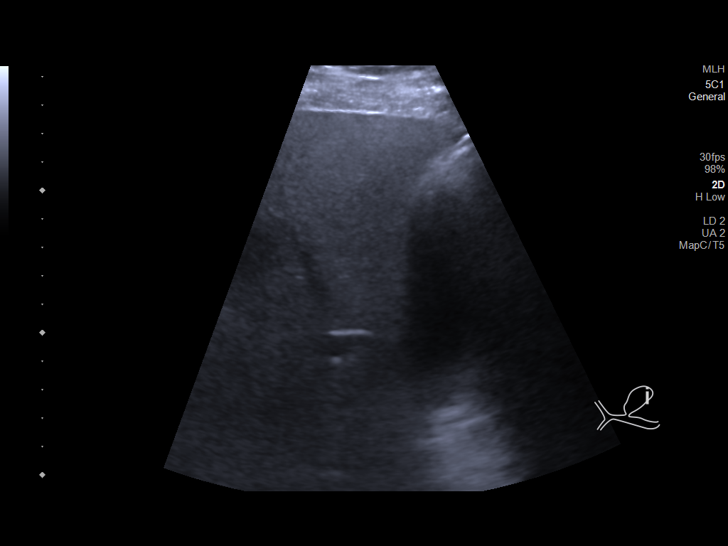
[im 22/86]
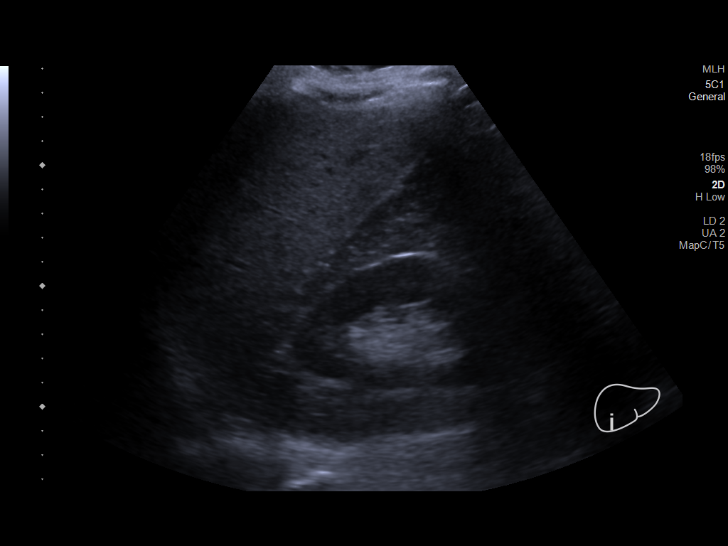
[im 29/86]
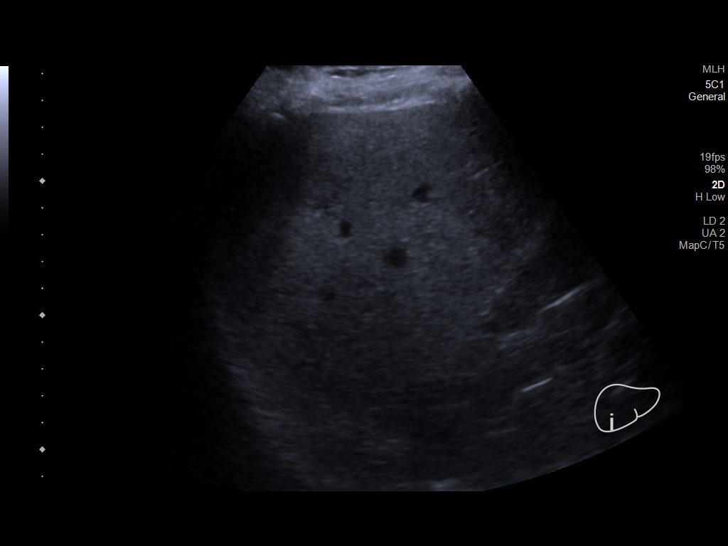
[im 36/86]
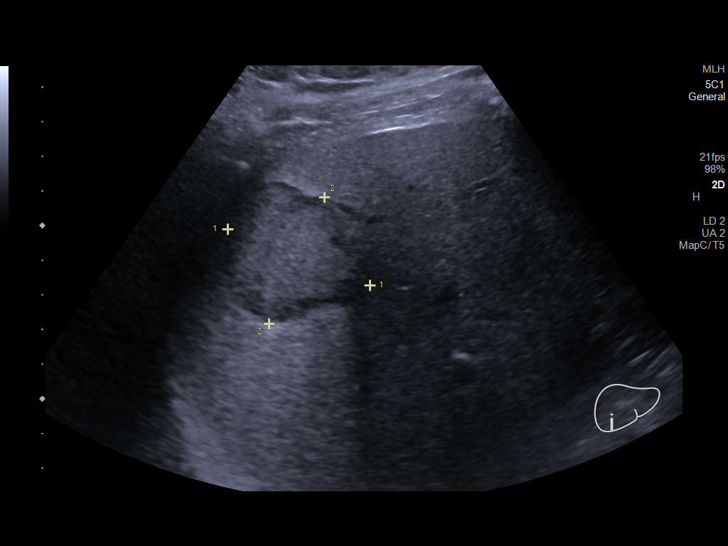
[im 43/86]
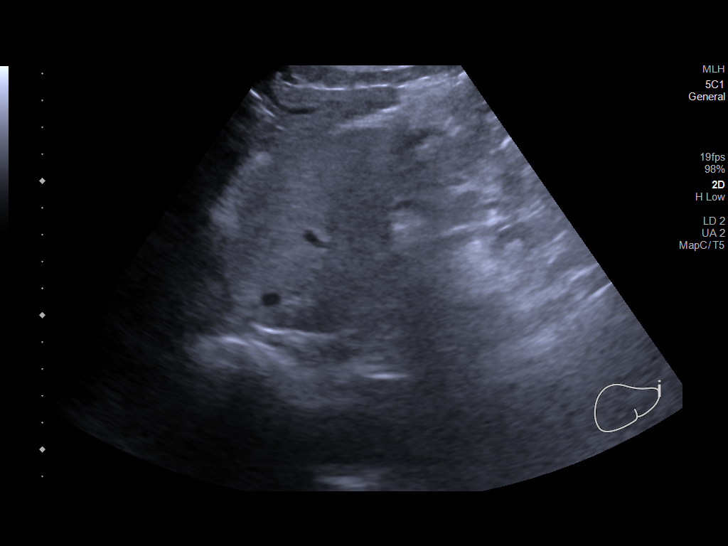
[im 50/86]
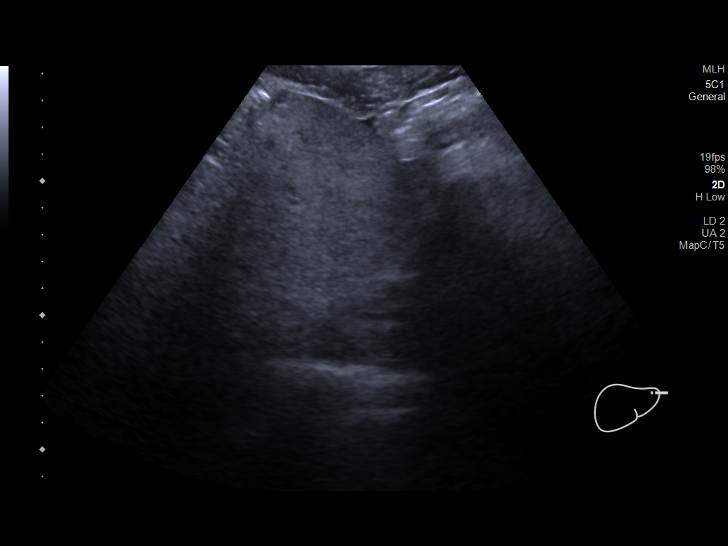
[im 57/86]
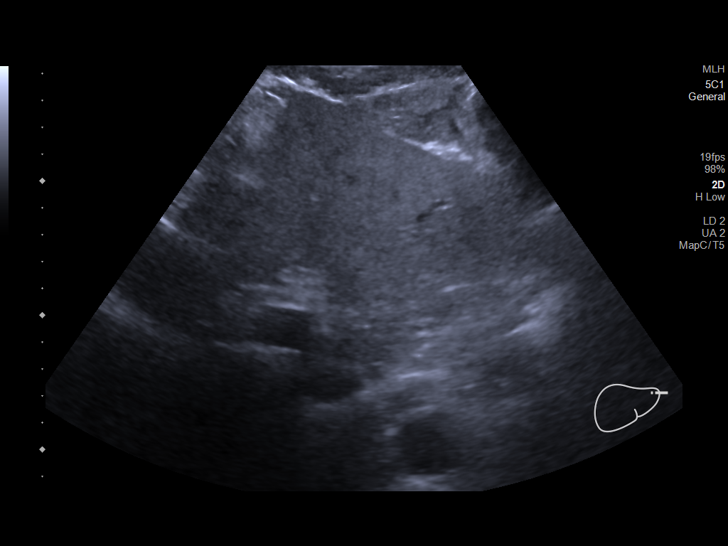
[im 64/86]
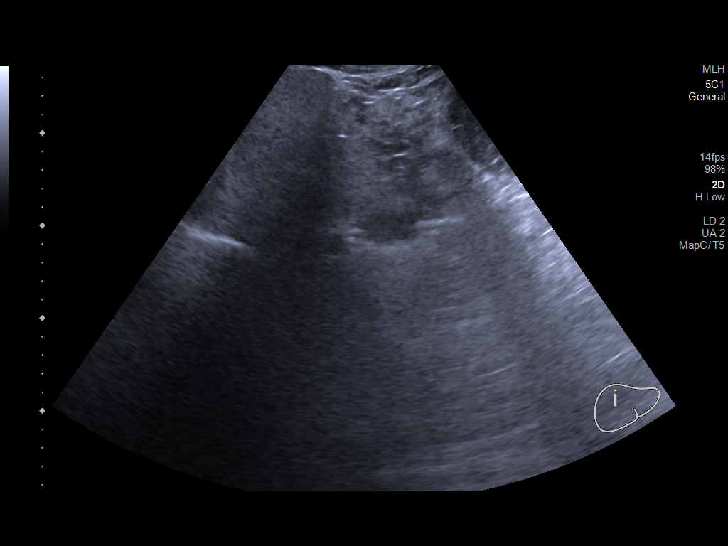
[im 71/86]
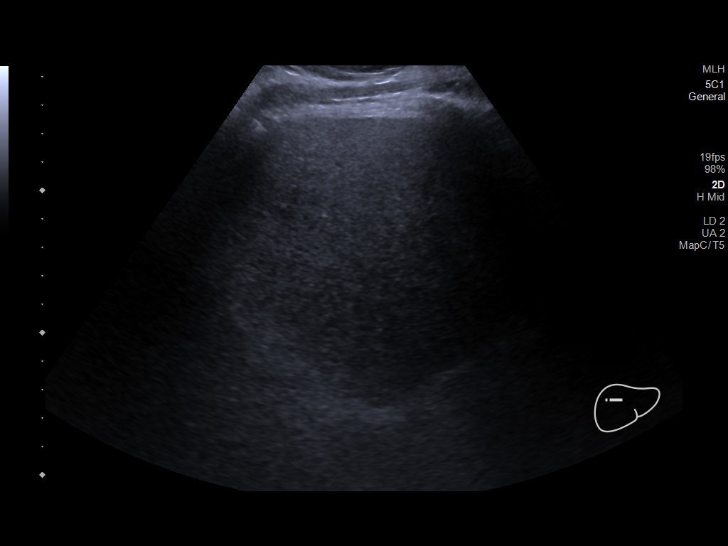
[im 78/86]
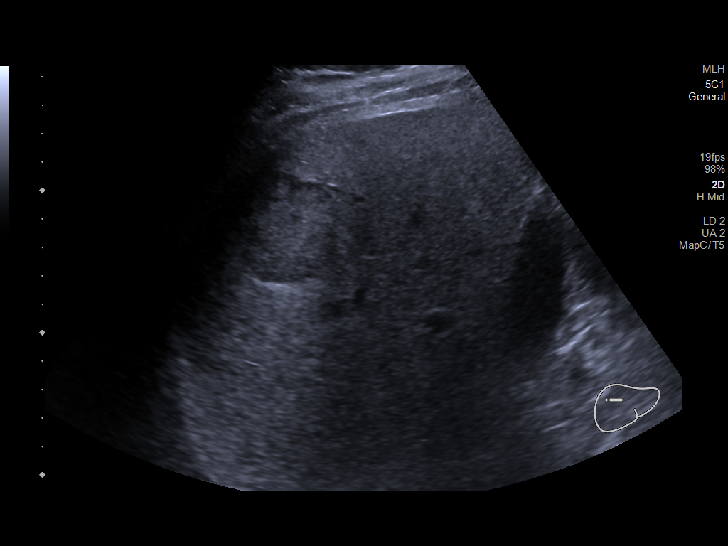
[im 86/86]
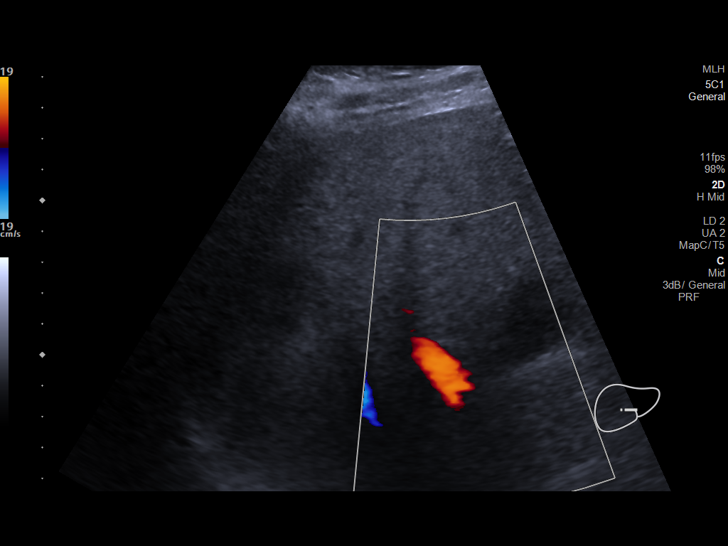

[13 of 25 positions shown; findings below may reference images not displayed]

FINDINGS: Gallbladder:

No gallstones or wall thickening visualized. No pericholecystic
fluid evident. No sonographic Murphy sign noted by sonographer.

Common bile duct:

Diameter: 3 mm. No intrahepatic or extrahepatic biliary duct
dilatation evident. Note that much of the common bile duct is
obscured by gas.

Liver:

There is a solid mass in the right lobe of the liver measuring 4.4 x
4.9 x 4.0 cm which does not have a typical hemangioma appearance. No
other focal liver lesion is evident on this study. There is diffuse
increase in liver echogenicity, a finding indicative of hepatic
steatosis. Portal vein is patent on color Doppler imaging with
normal direction of blood flow towards the liver.

Other: Multiple fluid-filled loops of bowel are noted throughout the
upper abdomen.
IMPRESSION: 1. There is a solid mass in the right lobe of the liver which based
on current measurements may have enlarged since the prior study from
[3Z]. Images from the previous MR at that time cannot be retrieved.
Given this circumstance, it may be prudent to correlate with pre and
serial post-contrast MR or CT of the liver to further evaluate.
There is underlying diffuse increase in liver echogenicity, a
finding indicative of hepatic steatosis.

2. Multiple loops of fluid-filled bowel. Question a degree of ileus
or enteritis.

3. Study otherwise unremarkable. Note that much of the common bile
duct is obscured by gas.

## 2019-06-11 IMAGING — DX DG ABDOMEN 1V
1 series · 1 of 1 positions shown · non-contrast
Comparison: CT earlier today

CLINICAL DATA: NG tube placement

EXAM:
ABDOMEN - 1 VIEW

[abdomen kub]
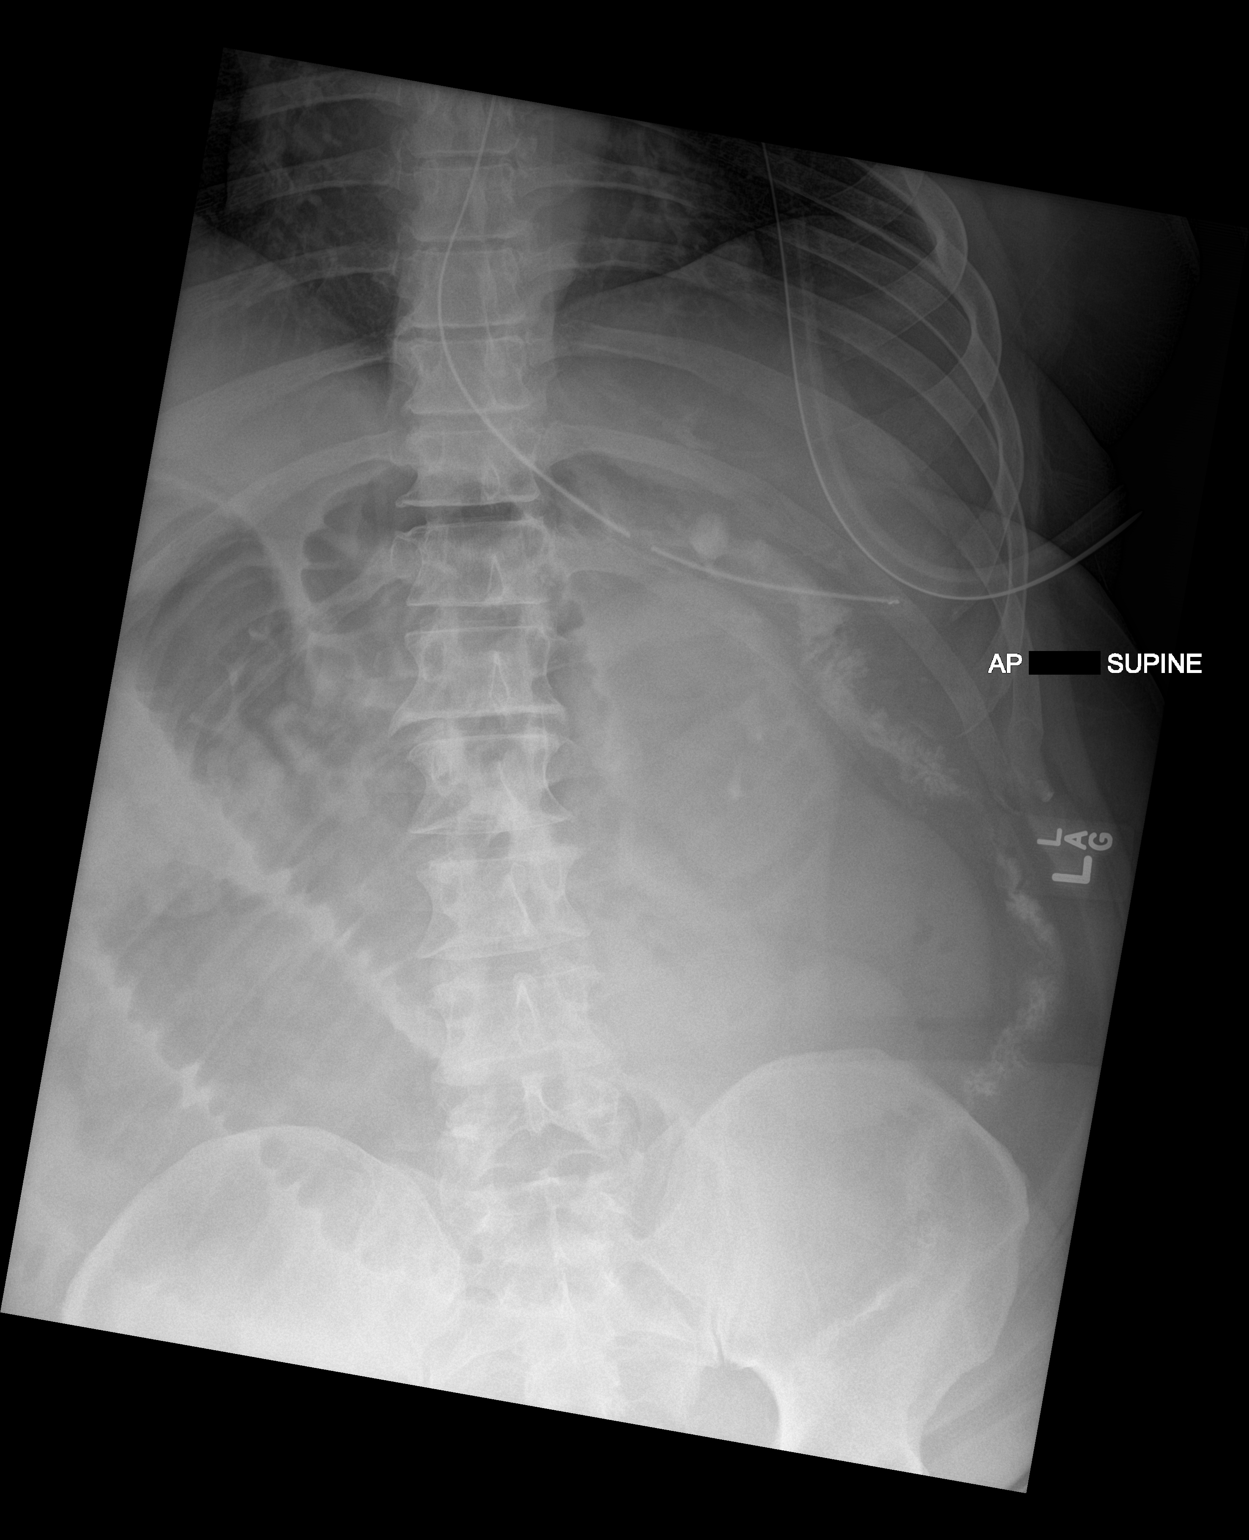

[1 of 1 positions shown; findings below may reference images not displayed]

FINDINGS: NG tube is in the stomach. Dilated small bowel loops compatible with
small bowel obstruction.
IMPRESSION: NG tube tip in the stomach. Continued small bowel obstruction
pattern.

## 2019-06-11 MED ORDER — ONDANSETRON HCL 4 MG PO TABS: 4.0000 mg | ORAL_TABLET | Freq: Four times a day (QID) | ORAL | Status: DC | PRN

## 2019-06-11 MED ORDER — MORPHINE SULFATE (PF) 4 MG/ML IV SOLN
3.0000 mg | INTRAVENOUS | Status: DC | PRN
  Administered 2019-06-11: 3 mg via INTRAVENOUS
  Filled 2019-06-11: qty 1

## 2019-06-11 MED ORDER — SODIUM CHLORIDE 0.9 % IV BOLUS
1000.0000 mL | Freq: Once | INTRAVENOUS | Status: AC
  Administered 2019-06-11: 1000 mL via INTRAVENOUS

## 2019-06-11 MED ORDER — IOHEXOL 300 MG/ML  SOLN
100.0000 mL | Freq: Once | INTRAMUSCULAR | Status: AC | PRN
  Administered 2019-06-11: 100 mL via INTRAVENOUS

## 2019-06-11 MED ORDER — ALBUTEROL SULFATE (2.5 MG/3ML) 0.083% IN NEBU: 2.5000 mg | INHALATION_SOLUTION | RESPIRATORY_TRACT | Status: DC | PRN

## 2019-06-11 MED ORDER — POTASSIUM CHLORIDE CRYS ER 20 MEQ PO TBCR
40.0000 meq | EXTENDED_RELEASE_TABLET | Freq: Once | ORAL | Status: AC
  Administered 2019-06-11: 40 meq via ORAL
  Filled 2019-06-11: qty 2

## 2019-06-11 MED ORDER — HEPARIN SODIUM (PORCINE) 5000 UNIT/ML IJ SOLN
5000.0000 [IU] | Freq: Three times a day (TID) | INTRAMUSCULAR | Status: DC
  Administered 2019-06-11 – 2019-06-12 (×2): 5000 [IU] via SUBCUTANEOUS
  Filled 2019-06-11 (×2): qty 1

## 2019-06-11 MED ORDER — KCL IN DEXTROSE-NACL 20-5-0.45 MEQ/L-%-% IV SOLN
INTRAVENOUS | Status: DC
  Administered 2019-06-11: 18:00:00 via INTRAVENOUS
  Filled 2019-06-11 (×3): qty 1000

## 2019-06-11 MED ORDER — ONDANSETRON HCL 4 MG/2ML IJ SOLN
4.0000 mg | Freq: Four times a day (QID) | INTRAMUSCULAR | Status: DC | PRN
  Administered 2019-06-12: 4 mg via INTRAVENOUS

## 2019-06-11 MED ORDER — LABETALOL HCL 5 MG/ML IV SOLN
10.0000 mg | INTRAVENOUS | Status: DC | PRN
  Administered 2019-06-11 – 2019-06-12 (×5): 10 mg via INTRAVENOUS
  Filled 2019-06-11 (×6): qty 4

## 2019-06-11 MED ORDER — POTASSIUM CHLORIDE 10 MEQ/100ML IV SOLN
10.0000 meq | INTRAVENOUS | Status: AC
  Administered 2019-06-11 – 2019-06-12 (×4): 10 meq via INTRAVENOUS
  Filled 2019-06-11 (×4): qty 100

## 2019-06-11 MED ORDER — HYDRALAZINE HCL 20 MG/ML IJ SOLN
10.0000 mg | Freq: Once | INTRAMUSCULAR | Status: AC
  Administered 2019-06-12: 10 mg via INTRAVENOUS
  Filled 2019-06-11: qty 1

## 2019-06-11 NOTE — ED Notes (Signed)
ED TO INPATIENT HANDOFF REPORT  ED Nurse Name and Phone #: [phone]  S Name/Age/Gender Dwayne Sawyer 48 y.o. male Room/Bed: MH09/MH09  Code Status   Code Status: Not on file  Home/SNF/Other Home Patient oriented to: self, place, time and situation Is this baseline? Yes   Triage Complete: Triage complete  Chief Complaint ABD PAIN  Triage Note RUQ Pain and vomiting x5 weeks.  Pain is worse after eating.   Allergies No Known Allergies  Level of Care/Admitting Diagnosis ED Disposition    ED Disposition Condition [facility]: [place] [[id number]]  Level of Care: Med-Surg [16]  Covid Evaluation: Asymptomatic Screening Protocol (No Symptoms)  Diagnosis: Bowel obstruction [facility]) [id number]  Admitting Physician: [name] [id number]  Attending Physician: [name] [id number]  Estimated length of stay: past midnight tomorrow  Certification:: I certify this patient will need inpatient services for at least 2 midnights  PT Class (Do Not Modify): Inpatient [101]  PT Acc Code (Do Not Modify): Private [1]       B Medical/Surgery History Past Medical History:  Diagnosis Date  . Arthritis   . High cholesterol   . Hypertension    Past Surgical History:  Procedure Laterality Date  . HERNIA REPAIR    . SHOULDER SURGERY       A IV Location/Drains/Wounds Patient Lines/Drains/Airways Status   Active Line/Drains/Airways    Name:   Placement date:   Placement time:   Site:   Days:   Peripheral IV 06/11/19 Left Antecubital   06/11/19    0946    Antecubital   less than 1          Intake/Output Last 24 hours No intake or output data in the 24 hours ending 06/11/19 1458  Labs/Imaging Results for orders placed or performed during the hospital encounter of 06/11/19 (from the past 48 hour(s))  Lipase, blood     Status: None   Collection Time: 06/11/19  9:47 AM  Result Value Ref Range   Lipase 31 11 - 51 U/L    Comment:  Performed at [facility], [address] [place], [name] [address]  CBC with Differential     Status: Abnormal   Collection Time: 06/11/19  9:47 AM  Result Value Ref Range   WBC 7.8 4.0 - 10.5 K/uL   RBC 6.23 (H) 4.22 - 5.81 MIL/uL   Hemoglobin 17.0 13.0 - 17.0 g/dL   HCT 52.2 (H) 39.0 - 52.0 %   MCV 83.8 80.0 - 100.0 fL   MCH 27.3 26.0 - 34.0 pg   MCHC 32.6 30.0 - 36.0 g/dL   RDW 13.2 11.5 - 15.5 %   Platelets 396 150 - 400 K/uL   nRBC 0.0 0.0 - 0.2 %   Neutrophils Relative % 75 %   Neutro Abs 5.9 1.7 - 7.7 K/uL   Lymphocytes Relative 14 %   Lymphs Abs 1.1 0.7 - 4.0 K/uL   Monocytes Relative 9 %   Monocytes Absolute 0.7 0.1 - 1.0 K/uL   Eosinophils Relative 1 %   Eosinophils Absolute 0.1 0.0 - 0.5 K/uL   Basophils Relative 1 %   Basophils Absolute 0.1 0.0 - 0.1 K/uL   Immature Granulocytes 0 %   Abs Immature Granulocytes 0.02 0.00 - 0.07 K/uL    Comment: Performed at [facility], [address] [place], [name] [address]  Comprehensive metabolic panel     Status: Abnormal  Collection Time: 06/11/19  9:47 AM  Result Value Ref Range   Sodium 137 135 - 145 mmol/L   Potassium 3.0 (L) 3.5 - 5.1 mmol/L   Chloride 97 (L) 98 - 111 mmol/L   CO2 27 22 - 32 mmol/L   Glucose, Bld 109 (H) 70 - 99 mg/dL   BUN 16 6 - 20 mg/dL   Creatinine, Ser 1.62 (H) 0.61 - 1.24 mg/dL   Calcium 9.0 8.9 - 10.3 mg/dL   Total Protein 7.9 6.5 - 8.1 g/dL   Albumin 4.2 3.5 - 5.0 g/dL   AST 19 15 - 41 U/L   ALT 25 0 - 44 U/L   Alkaline Phosphatase 123 38 - 126 U/L   Total Bilirubin 1.1 0.3 - 1.2 mg/dL   GFR calc non Af Amer 49 (L) >60 mL/min   GFR calc Af Amer 57 (L) >60 mL/min   Anion gap 13 5 - 15    Comment: Performed at [facility], [address] [place], [name] [address]  Urinalysis, Routine w reflex microscopic     Status: Abnormal   Collection Time: 06/11/19  1:14 PM  Result Value Ref Range   Color, Urine YELLOW YELLOW   APPearance CLEAR CLEAR    Specific Gravity, Urine 1.025 1.005 - 1.030   pH 6.0 5.0 - 8.0   Glucose, UA NEGATIVE NEGATIVE mg/dL   Hgb urine dipstick NEGATIVE NEGATIVE   Bilirubin Urine SMALL (A) NEGATIVE   Ketones, ur 40 (A) NEGATIVE mg/dL   Protein, ur 30 (A) NEGATIVE mg/dL   Nitrite NEGATIVE NEGATIVE   Leukocytes,Ua NEGATIVE NEGATIVE    Comment: Performed at [facility], [address] [place], [name] [address]  Urinalysis, Microscopic (reflex)     Status: Abnormal   Collection Time: 06/11/19  1:14 PM  Result Value Ref Range   RBC / HPF 0-5 0 - 5 RBC/hpf   WBC, UA 0-5 0 - 5 WBC/hpf   Bacteria, UA RARE (A) NONE SEEN   Squamous Epithelial / LPF 0-5 0 - 5   Mucus PRESENT    Hyaline Casts, UA PRESENT     Comment: Performed at [facility], [address] [place], [name] [address]   Ct Abdomen Pelvis W Contrast  Result Date: 06/11/2019 CLINICAL DATA:  Right upper quadrant pain and vomiting. EXAM: CT ABDOMEN AND PELVIS WITH CONTRAST TECHNIQUE: Multidetector CT imaging of the abdomen and pelvis was performed using the standard protocol following bolus administration of intravenous contrast. CONTRAST:  179mL OMNIPAQUE IOHEXOL 300 MG/ML  SOLN COMPARISON:  None. FINDINGS: Lower chest: Unremarkable. Hepatobiliary: Geographic low attenuation within the liver parenchyma is probably fatty deposition. Focal 14 mm low-density lesion identified in the right liver on 16/2. There is no evidence for gallstones, gallbladder wall thickening, or pericholecystic fluid. No intrahepatic or extrahepatic biliary dilation. Pancreas: No focal mass lesion. No dilatation of the main duct. No intraparenchymal cyst. No peripancreatic edema. Spleen: No splenomegaly. No focal mass lesion. Adrenals/Urinary Tract: No adrenal nodule or mass. 4 mm hypodensity in the interpolar right kidney is too small to characterize 10 mm low-density cortical lesion noted upper pole left kidney with another 6 mm hypodensity in the interpolar  region of the left kidney. No evidence for hydroureter. The urinary bladder appears normal for the degree of distention. Stomach/Bowel: Stomach is unremarkable. No gastric wall thickening. No evidence of outlet obstruction. Duodenum is normally positioned as is the ligament of Treitz. There is  marked small-bowel obstruction with mid to distal small bowel measuring up to 5.8 cm diameter. These dilated bowel loops are fluid-filled. In the right abdomen, the abnormal bowel gradually tapers and begins demonstrating circumferential wall thickening. The abnormal small bowel demonstrates an abrupt transition (visible on axial 53/2 and coronal 41/5) immediately adjacent to central mesenteric desmoplastic response. This desmoplasia is centered on a 3.8 x 2.3 x 2.8 cm mesenteric soft tissue lesion with several associated punctate calcifications. Small bowel in this region is tethered to the mesenteric nodule in there are numerous adjacent small mesenteric lymph nodes. The distal and terminal ileum is decompressed. Colon is diffusely decompressed. Vascular/Lymphatic: There is abdominal aortic atherosclerosis without aneurysm. No gastrohepatic or hepato duodenal ligament lymphadenopathy. As above, numerous small nodes are seen in the mesentery of the right abdomen. No pelvic sidewall lymphadenopathy. Reproductive: Prostate gland is mildly enlarged. Other: Small volume interloop mesenteric fluid noted without substantial free fluid in the pelvis. Musculoskeletal: 6 mm sclerotic focus noted in the T12 vertebral body. Tiny sclerotic foci seen in each femoral head. Small bilateral groin hernias contain only fat. IMPRESSION: 1. High-grade small bowel obstruction secondary to desmoplastic response in the central right abdominal mesentery centered on a 4 cm calcified irregular/spiculated soft tissue lesion. Abrupt small bowel transition zone is identified immediately lead adjacent to this mesenteric lesion and multiple adjacent bowel  loops are tethered into this region with mesenteric edema/congestion. The loop of small bowel immediately proximal to the transition zone shows mild circumferential wall thickening but no pneumatosis. 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 2. Small lymph nodes in the abnormal right mesentery suggest additional metastatic involvement. 3. Heterogeneous liver parenchyma, likely secondary to geographic fatty deposition. There is a focal 14 mm low-density lesion in the right liver. The patient had an MRI in the [facility] system on 08/09/2013 to evaluate a right liver lesion, but those images are not available. Follow-up MRI without and with contrast recommended to exclude metastatic disease. 4. Tiny sclerotic foci in the T12 vertebral body in both femoral heads are likely benign, but close attention on follow-up recommended. 5.  Aortic Atherosclerois (ICD10-170.0) Electronically Signed   By: Misty Stanley M.D.   On: 06/11/2019 13:34   US Abdomen Limited  Result Date: 06/11/2019 CLINICAL DATA:  Upper abdominal pain EXAM: ULTRASOUND ABDOMEN LIMITED RIGHT UPPER QUADRANT COMPARISON:  Report of prior abdominal MRI August 09, 2013 available; images from that study cannot be retrieved. FINDINGS: Gallbladder: No gallstones or wall thickening visualized. No pericholecystic fluid evident. No sonographic Murphy sign noted by sonographer. Common bile duct: Diameter: 3 mm. No intrahepatic or extrahepatic biliary duct dilatation evident. Note that much of the common bile duct is obscured by gas. Liver: There is a solid mass in the right lobe of the liver measuring 4.4 x 4.9 x 4.0 cm which does not have a typical hemangioma appearance. No other focal liver lesion is evident on this study. There is diffuse increase in liver echogenicity, a finding indicative of hepatic steatosis. Portal vein is patent on color Doppler imaging with normal direction  of blood flow towards the liver. Other: Multiple fluid-filled loops of bowel are noted throughout the upper abdomen. IMPRESSION: 1. There is a solid mass in the right lobe of the liver which based on current measurements may have enlarged since the prior study from 2015. Images from the previous MR at that time cannot be retrieved. Given this circumstance, it may be prudent  to correlate with pre and serial post-contrast MR or CT of the liver to further evaluate. There is underlying diffuse increase in liver echogenicity, a finding indicative of hepatic steatosis. 2. Multiple loops of fluid-filled bowel. Question a degree of ileus or enteritis. 3. Study otherwise unremarkable. Note that much of the common bile duct is obscured by gas. Electronically Signed   By: Lowella Grip III M.D.   On: 06/11/2019 11:09    Pending Labs Unresulted Labs (From admission, onward)    Start     Ordered   06/11/19 1410  SARS CORONAVIRUS 2 (TAT 6-24 HRS) Nasopharyngeal Nasopharyngeal Swab  (Symptomatic/High Risk of Exposure/Tier 1 Patients Labs with Precautions)  Once,   STAT    Question Answer Comment  Is this test for diagnosis or screening Screening   Symptomatic for COVID-19 as defined by CDC No   Hospitalized for COVID-19 No   Admitted to ICU for COVID-19 No   Previously tested for COVID-19 No   Resident in a congregate (group) care setting No   Employed in healthcare setting No      06/11/19 1410          Vitals/Pain Today's Vitals   06/11/19 0927 06/11/19 0928 06/11/19 0929  BP: (!) 222/143 (!) 205/145 (S) (!) 194/140  Pulse: (!) 114    Resp: 16    Temp: 98.7 F (37.1 C)    TempSrc: Oral    SpO2: 100%    Weight: 105.8 kg    Height: 5\' 9"  (1.753 m)    PainSc:  7      Isolation Precautions No active isolations  Medications Medications  sodium chloride 0.9 % bolus 1,000 mL (0 mLs Intravenous Stopped 06/11/19 1314)  potassium chloride SA (KLOR-CON) CR tablet 40 mEq (40 mEq Oral Given  06/11/19 1139)  iohexol (OMNIPAQUE) 300 MG/ML solution 100 mL (100 mLs Intravenous Contrast Given 06/11/19 1302)    Mobility manual wheelchair Low fall risk   Focused Assessments .   R Recommendations: See Admitting Provider Note  Report given to:   Additional Notes:

## 2019-06-11 NOTE — Progress Notes (Signed)
Pt arrived from [facility] med center with a blood pressure of 168/132. MD paged new orders placed.

## 2019-06-11 NOTE — Consult Note (Signed)
[facility] Consult/Admission Note --11 June 2019  Dwayne Sawyer Nov 26, 1970  [id number].    Requesting MD: [name] Chief Complaint:RUQ pain worse with eating x 5 weeks Reason for Consult:    HPI: Pt is a 48 y/o  male with a hx of hypertension, rheumatoid arthritis, and hyperlipidemia who presents to [facility] with the above complaints.  He has lost his insurance and has not been on his medicines for some time.  His RA however is not bothering him.  He previously worked for a [employer] resources [employer].  His PCP is [name] in [place].    He states that the symptoms have appeared gradually over the last month and his abdomen has swollen considerably.  He mainly has watery BMs and doesn't eat.  He can watch peristalsis on his abdomen and there is audible borborygumus.      Work up in the ED:  He is afebrile,and tachycardic.  K+ 3.0, creatinine 1.62 LFT's are normal, GFR 49.  WBC 7.8, H/H 17/52, platelets, 396K. UA is unremarkable.    Abdominal ultrasound shows a solid mass in the right lobe of the liver which based on current  measurements  May have enlarged since study 2015.  Multiple loops of fluid filled bowel, with possible ileus vs enteritis.  CT scan:no evidence for gallstones, gallbladder wall thickening, or pericholecystic fluid. No intrahepatic or extrahepatic biliary dilation.  High-grade small bowel obstruction secondary to desmoplastic response in the central right abdominal mesentery centered on a 4 cm calcified irregular/spiculated soft tissue lesion. Abrupt small bowel transition zone is identified immediately lead adjacent to this mesenteric lesion and multiple adjacent bowel loops are tethered into this region with mesenteric edema/congestion. The loop of small bowel immediately proximal to the transition zone shows mild circumferential wall thickening but no pneumatosis. 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 2. Small lymph nodes in the abnormal right mesentery suggest additional metastatic involvement. 3. Heterogeneous liver parenchyma, likely secondary to geographic fatty deposition. There is a focal 14 mm low-density lesion in the right liver. Tiny sclerotic foci in the T12 vertebral body in both femoral heads are likely benign, but close attention on follow-up recommended.  ROS:  Hypertension and nontreated RA secondary to losing his job and insurance ROS  No family history on file.  Past Medical History:  Diagnosis Date  . Arthritis   . High cholesterol   . Hypertension     Past Surgical History:  Procedure Laterality Date  . HERNIA REPAIR    . SHOULDER [facility]      Social History:  reports that he has never smoked. His smokeless tobacco use includes chew. He reports current alcohol use. He reports that he does not use drugs.  Allergies: No Known Allergies  Prior to Admission medications   Medication Sig Start Date End Date Taking? Authorizing Provider  amLODipine (NORVASC) 10 MG tablet TAKE 1 TABLET DAILY AS DIRECTED. 01/21/16  Yes [provider]  atorvastatin (LIPITOR) 10 MG tablet TAKE 1 TABLET AT BEDTIME 01/21/16  Yes [provider]  etanercept (ENBREL) 50 MG/ML injection INJECT 50 MG SUBCUTANEOUSLY EVERY WEEK 02/28/16  Yes [provider]  lisinopril-hydrochlorothiazide (ZESTORETIC) 20-25 MG tablet TAKE ONE TABLET BY MOUTH ONE TIME DAILY 01/21/16  Yes [provider]  methotrexate (RHEUMATREX) 2.5 MG tablet TAKE 7 TABLETS BY MOUTH ONCE A WEEK 02/28/16  Yes [provider]     Blood pressure (S) (!) 194/140, pulse (!) 114, temperature  98.7 F (37.1 C), temperature source Oral, resp. rate 16, height 5\' 9"  (1.753 m), weight 105.8 kg, SpO2 100 %. Physical Exam: WM NAD but with markedly distended abdomen.    Abdomen is soft and nontender to palpation.  Audible bowel sound occasionally at the bedside.   Physical  Exam  Results for orders placed or performed during the hospital encounter of 06/11/19 (from the past 48 hour(s))  Lipase, blood     Status: None   Collection Time: 06/11/19  9:47 AM  Result Value Ref Range   Lipase 31 11 - 51 U/L    Comment: Performed at [facility], [address] [place], [name] [address]  CBC with Differential     Status: Abnormal   Collection Time: 06/11/19  9:47 AM  Result Value Ref Range   WBC 7.8 4.0 - 10.5 K/uL   RBC 6.23 (H) 4.22 - 5.81 MIL/uL   Hemoglobin 17.0 13.0 - 17.0 g/dL   HCT 52.2 (H) 39.0 - 52.0 %   MCV 83.8 80.0 - 100.0 fL   MCH 27.3 26.0 - 34.0 pg   MCHC 32.6 30.0 - 36.0 g/dL   RDW 13.2 11.5 - 15.5 %   Platelets 396 150 - 400 K/uL   nRBC 0.0 0.0 - 0.2 %   Neutrophils Relative % 75 %   Neutro Abs 5.9 1.7 - 7.7 K/uL   Lymphocytes Relative 14 %   Lymphs Abs 1.1 0.7 - 4.0 K/uL   Monocytes Relative 9 %   Monocytes Absolute 0.7 0.1 - 1.0 K/uL   Eosinophils Relative 1 %   Eosinophils Absolute 0.1 0.0 - 0.5 K/uL   Basophils Relative 1 %   Basophils Absolute 0.1 0.0 - 0.1 K/uL   Immature Granulocytes 0 %   Abs Immature Granulocytes 0.02 0.00 - 0.07 K/uL    Comment: Performed at [facility], [address] [place], [name] [address]  Comprehensive metabolic panel     Status: Abnormal   Collection Time: 06/11/19  9:47 AM  Result Value Ref Range   Sodium 137 135 - 145 mmol/L   Potassium 3.0 (L) 3.5 - 5.1 mmol/L   Chloride 97 (L) 98 - 111 mmol/L   CO2 27 22 - 32 mmol/L   Glucose, Bld 109 (H) 70 - 99 mg/dL   BUN 16 6 - 20 mg/dL   Creatinine, Ser 1.62 (H) 0.61 - 1.24 mg/dL   Calcium 9.0 8.9 - 10.3 mg/dL   Total Protein 7.9 6.5 - 8.1 g/dL   Albumin 4.2 3.5 - 5.0 g/dL   AST 19 15 - 41 U/L   ALT 25 0 - 44 U/L   Alkaline Phosphatase 123 38 - 126 U/L   Total Bilirubin 1.1 0.3 - 1.2 mg/dL   GFR calc non Af Amer 49 (L) >60 mL/min   GFR calc Af Amer 57 (L) >60 mL/min   Anion gap 13 5 - 15    Comment: Performed at [facility], [address] [place], [name] [address]  Urinalysis, Routine w reflex microscopic     Status: Abnormal   Collection Time: 06/11/19  1:14 PM  Result Value Ref Range   Color, Urine YELLOW YELLOW   APPearance CLEAR CLEAR   Specific Gravity, Urine 1.025 1.005 - 1.030   pH 6.0 5.0 - 8.0   Glucose, UA NEGATIVE NEGATIVE mg/dL   Hgb urine dipstick NEGATIVE NEGATIVE   Bilirubin Urine SMALL (A) NEGATIVE  Ketones, ur 40 (A) NEGATIVE mg/dL   Protein, ur 30 (A) NEGATIVE mg/dL   Nitrite NEGATIVE NEGATIVE   Leukocytes,Ua NEGATIVE NEGATIVE    Comment: Performed at [facility], [address] [place], [name] [address]  Urinalysis, Microscopic (reflex)     Status: Abnormal   Collection Time: 06/11/19  1:14 PM  Result Value Ref Range   RBC / HPF 0-5 0 - 5 RBC/hpf   WBC, UA 0-5 0 - 5 WBC/hpf   Bacteria, UA RARE (A) NONE SEEN   Squamous Epithelial / LPF 0-5 0 - 5   Mucus PRESENT    Hyaline Casts, UA PRESENT     Comment: Performed at [facility], [address] [place], [name] [address]   Ct Abdomen Pelvis W Contrast  Result Date: 06/11/2019 CLINICAL DATA:  Right upper quadrant pain and vomiting. EXAM: CT ABDOMEN AND PELVIS WITH CONTRAST TECHNIQUE: Multidetector CT imaging of the abdomen and pelvis was performed using the standard protocol following bolus administration of intravenous contrast. CONTRAST:  18mL OMNIPAQUE IOHEXOL 300 MG/ML  SOLN COMPARISON:  None. FINDINGS: Lower chest: Unremarkable. Hepatobiliary: Geographic low attenuation within the liver parenchyma is probably fatty deposition. Focal 14 mm low-density lesion identified in the right liver on 16/2. There is no evidence for gallstones, gallbladder wall thickening, or pericholecystic fluid. No intrahepatic or extrahepatic biliary dilation. Pancreas: No focal mass lesion. No dilatation of the main duct. No intraparenchymal cyst. No peripancreatic edema. Spleen: No splenomegaly. No focal mass  lesion. Adrenals/Urinary Tract: No adrenal nodule or mass. 4 mm hypodensity in the interpolar right kidney is too small to characterize 10 mm low-density cortical lesion noted upper pole left kidney with another 6 mm hypodensity in the interpolar region of the left kidney. No evidence for hydroureter. The urinary bladder appears normal for the degree of distention. Stomach/Bowel: Stomach is unremarkable. No gastric wall thickening. No evidence of outlet obstruction. Duodenum is normally positioned as is the ligament of Treitz. There is marked small-bowel obstruction with mid to distal small bowel measuring up to 5.8 cm diameter. These dilated bowel loops are fluid-filled. In the right abdomen, the abnormal bowel gradually tapers and begins demonstrating circumferential wall thickening. The abnormal small bowel demonstrates an abrupt transition (visible on axial 53/2 and coronal 41/5) immediately adjacent to central mesenteric desmoplastic response. This desmoplasia is centered on a 3.8 x 2.3 x 2.8 cm mesenteric soft tissue lesion with several associated punctate calcifications. Small bowel in this region is tethered to the mesenteric nodule in there are numerous adjacent small mesenteric lymph nodes. The distal and terminal ileum is decompressed. Colon is diffusely decompressed. Vascular/Lymphatic: There is abdominal aortic atherosclerosis without aneurysm. No gastrohepatic or hepato duodenal ligament lymphadenopathy. As above, numerous small nodes are seen in the mesentery of the right abdomen. No pelvic sidewall lymphadenopathy. Reproductive: Prostate gland is mildly enlarged. Other: Small volume interloop mesenteric fluid noted without substantial free fluid in the pelvis. Musculoskeletal: 6 mm sclerotic focus noted in the T12 vertebral body. Tiny sclerotic foci seen in each femoral head. Small bilateral groin hernias contain only fat. IMPRESSION: 1. High-grade small bowel obstruction secondary to desmoplastic  response in the central right abdominal mesentery centered on a 4 cm calcified irregular/spiculated soft tissue lesion. Abrupt small bowel transition zone is identified immediately lead adjacent to this mesenteric lesion and multiple adjacent bowel loops are tethered into this region with mesenteric edema/congestion. The loop of small bowel immediately proximal to the transition zone shows  mild circumferential wall thickening but no pneumatosis. 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 2. Small lymph nodes in the abnormal right mesentery suggest additional metastatic involvement. 3. Heterogeneous liver parenchyma, likely secondary to geographic fatty deposition. There is a focal 14 mm low-density lesion in the right liver. The patient had an MRI in the [facility] system on 08/09/2013 to evaluate a right liver lesion, but those images are not available. Follow-up MRI without and with contrast recommended to exclude metastatic disease. 4. Tiny sclerotic foci in the T12 vertebral body in both femoral heads are likely benign, but close attention on follow-up recommended. 5.  Aortic Atherosclerois (ICD10-170.0) Electronically Signed   By: Misty Stanley M.D.   On: 06/11/2019 13:34   US Abdomen Limited  Result Date: 06/11/2019 CLINICAL DATA:  Upper abdominal pain EXAM: ULTRASOUND ABDOMEN LIMITED RIGHT UPPER QUADRANT COMPARISON:  Report of prior abdominal MRI August 09, 2013 available; images from that study cannot be retrieved. FINDINGS: Gallbladder: No gallstones or wall thickening visualized. No pericholecystic fluid evident. No sonographic Murphy sign noted by sonographer. Common bile duct: Diameter: 3 mm. No intrahepatic or extrahepatic biliary duct dilatation evident. Note that much of the common bile duct is obscured by gas. Liver: There is a solid mass in the right lobe of the liver measuring 4.4 x 4.9 x 4.0 cm which does not  have a typical hemangioma appearance. No other focal liver lesion is evident on this study. There is diffuse increase in liver echogenicity, a finding indicative of hepatic steatosis. Portal vein is patent on color Doppler imaging with normal direction of blood flow towards the liver. Other: Multiple fluid-filled loops of bowel are noted throughout the upper abdomen. IMPRESSION: 1. There is a solid mass in the right lobe of the liver which based on current measurements may have enlarged since the prior study from 2015. Images from the previous MR at that time cannot be retrieved. Given this circumstance, it may be prudent to correlate with pre and serial post-contrast MR or CT of the liver to further evaluate. There is underlying diffuse increase in liver echogenicity, a finding indicative of hepatic steatosis. 2. Multiple loops of fluid-filled bowel. Question a degree of ileus or enteritis. 3. Study otherwise unremarkable. Note that much of the common bile duct is obscured by gas. Electronically Signed   By: Lowella Grip III M.D.   On: 06/11/2019 11:09      Assessment/Plan  Bowel obstruction probably due to neoplasm with adhesions;  This obstruction has occurred over the last month.  Will place NG tube and assess for laparotomy and resection/bypass.     Matt B. Hassell Done, MD, [facility].     Earnstine [facility] 06/11/2019, 2:41 PM Please see Amion for pager number during day hours 7:00am-4:30pm

## 2019-06-11 NOTE — H&P (View-Only) (Signed)
[facility] Consult/Admission Note --11 June 2019  Dwayne Sawyer [birth year]  TA:[id number].    Requesting MD: [name] Chief Complaint:RUQ pain worse with eating x 5 weeks Reason for Consult:    HPI: Pt is a 48 y/o  male with a hx of hypertension, rheumatoid arthritis, and hyperlipidemia who presents to [facility] with the above complaints.  He has lost his insurance and has not been on his medicines for some time.  His RA however is not bothering him.  He previously worked for a [employer] resources [employer].  His PCP is [name] in [place].    He states that the symptoms have appeared gradually over the last month and his abdomen has swollen considerably.  He mainly has watery BMs and doesn't eat.  He can watch peristalsis on his abdomen and there is audible borborygumus.      Work up in the ED:  He is afebrile,and tachycardic.  K+ 3.0, creatinine 1.62 LFT's are normal, GFR 49.  WBC 7.8, H/H 17/52, platelets, 396K. UA is unremarkable.    Abdominal ultrasound shows a solid mass in the right lobe of the liver which based on current  measurements  May have enlarged since study 2015.  Multiple loops of fluid filled bowel, with possible ileus vs enteritis.  CT scan:no evidence for gallstones, gallbladder wall thickening, or pericholecystic fluid. No intrahepatic or extrahepatic biliary dilation.  High-grade small bowel obstruction secondary to desmoplastic response in the central right abdominal mesentery centered on a 4 cm calcified irregular/spiculated soft tissue lesion. Abrupt small bowel transition zone is identified immediately lead adjacent to this mesenteric lesion and multiple adjacent bowel loops are tethered into this region with mesenteric edema/congestion. The loop of small bowel immediately proximal to the transition zone shows mild circumferential wall thickening but no pneumatosis. 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 2. Small lymph nodes in the abnormal right mesentery suggest additional metastatic involvement. 3. Heterogeneous liver parenchyma, likely secondary to geographic fatty deposition. There is a focal 14 mm low-density lesion in the right liver. Tiny sclerotic foci in the T12 vertebral body in both femoral heads are likely benign, but close attention on follow-up recommended.  ROS:  Hypertension and nontreated RA secondary to losing his job and insurance ROS  No family history on file.  Past Medical History:  Diagnosis Date  . Arthritis   . High cholesterol   . Hypertension     Past Surgical History:  Procedure Laterality Date  . HERNIA REPAIR    . SHOULDER SURGERY      Social History:  reports that he has never smoked. His smokeless tobacco use includes chew. He reports current alcohol use. He reports that he does not use drugs.  Allergies: No Known Allergies  Prior to Admission medications   Medication Sig Start Date End Date Taking? Authorizing Provider  amLODipine (NORVASC) 10 MG tablet TAKE 1 TABLET DAILY AS DIRECTED. 01/21/16  Yes [provider]  atorvastatin (LIPITOR) 10 MG tablet TAKE 1 TABLET AT BEDTIME 01/21/16  Yes [provider]  etanercept (ENBREL) 50 MG/ML injection INJECT 50 MG SUBCUTANEOUSLY EVERY WEEK 02/28/16  Yes [provider]  lisinopril-hydrochlorothiazide (ZESTORETIC) 20-25 MG tablet TAKE ONE TABLET BY MOUTH ONE TIME DAILY 01/21/16  Yes [provider]  methotrexate (RHEUMATREX) 2.5 MG tablet TAKE 7 TABLETS BY MOUTH ONCE A WEEK 02/28/16  Yes [provider]     Blood pressure (S) (!) 194/140, pulse (!) 114, temperature  98.7 F (37.1 C), temperature source Oral, resp. rate 16, height 5\' 9"  (1.753 m), weight 105.8 kg, SpO2 100 %. Physical Exam: WM NAD but with markedly distended abdomen.    Abdomen is soft and nontender to palpation.  Audible bowel sound occasionally at the bedside.   Physical  Exam  Results for orders placed or performed during the hospital encounter of 06/11/19 (from the past 48 hour(s))  Lipase, blood     Status: None   Collection Time: 06/11/19  9:47 AM  Result Value Ref Range   Lipase 31 11 - 51 U/L    Comment: Performed at [facility], [address] [place], [name] [address]  CBC with Differential     Status: Abnormal   Collection Time: 06/11/19  9:47 AM  Result Value Ref Range   WBC 7.8 4.0 - 10.5 K/uL   RBC 6.23 (H) 4.22 - 5.81 MIL/uL   Hemoglobin 17.0 13.0 - 17.0 g/dL   HCT 52.2 (H) 39.0 - 52.0 %   MCV 83.8 80.0 - 100.0 fL   MCH 27.3 26.0 - 34.0 pg   MCHC 32.6 30.0 - 36.0 g/dL   RDW 13.2 11.5 - 15.5 %   Platelets 396 150 - 400 K/uL   nRBC 0.0 0.0 - 0.2 %   Neutrophils Relative % 75 %   Neutro Abs 5.9 1.7 - 7.7 K/uL   Lymphocytes Relative 14 %   Lymphs Abs 1.1 0.7 - 4.0 K/uL   Monocytes Relative 9 %   Monocytes Absolute 0.7 0.1 - 1.0 K/uL   Eosinophils Relative 1 %   Eosinophils Absolute 0.1 0.0 - 0.5 K/uL   Basophils Relative 1 %   Basophils Absolute 0.1 0.0 - 0.1 K/uL   Immature Granulocytes 0 %   Abs Immature Granulocytes 0.02 0.00 - 0.07 K/uL    Comment: Performed at [facility], [address] [place], [name] [address]  Comprehensive metabolic panel     Status: Abnormal   Collection Time: 06/11/19  9:47 AM  Result Value Ref Range   Sodium 137 135 - 145 mmol/L   Potassium 3.0 (L) 3.5 - 5.1 mmol/L   Chloride 97 (L) 98 - 111 mmol/L   CO2 27 22 - 32 mmol/L   Glucose, Bld 109 (H) 70 - 99 mg/dL   BUN 16 6 - 20 mg/dL   Creatinine, Ser 1.62 (H) 0.61 - 1.24 mg/dL   Calcium 9.0 8.9 - 10.3 mg/dL   Total Protein 7.9 6.5 - 8.1 g/dL   Albumin 4.2 3.5 - 5.0 g/dL   AST 19 15 - 41 U/L   ALT 25 0 - 44 U/L   Alkaline Phosphatase 123 38 - 126 U/L   Total Bilirubin 1.1 0.3 - 1.2 mg/dL   GFR calc non Af Amer 49 (L) >60 mL/min   GFR calc Af Amer 57 (L) >60 mL/min   Anion gap 13 5 - 15    Comment: Performed at [facility], [address] [place], [name] [address]  Urinalysis, Routine w reflex microscopic     Status: Abnormal   Collection Time: 06/11/19  1:14 PM  Result Value Ref Range   Color, Urine YELLOW YELLOW   APPearance CLEAR CLEAR   Specific Gravity, Urine 1.025 1.005 - 1.030   pH 6.0 5.0 - 8.0   Glucose, UA NEGATIVE NEGATIVE mg/dL   Hgb urine dipstick NEGATIVE NEGATIVE   Bilirubin Urine SMALL (A) NEGATIVE  Ketones, ur 40 (A) NEGATIVE mg/dL   Protein, ur 30 (A) NEGATIVE mg/dL   Nitrite NEGATIVE NEGATIVE   Leukocytes,Ua NEGATIVE NEGATIVE    Comment: Performed at [facility], [address] [place], [name] [address]  Urinalysis, Microscopic (reflex)     Status: Abnormal   Collection Time: 06/11/19  1:14 PM  Result Value Ref Range   RBC / HPF 0-5 0 - 5 RBC/hpf   WBC, UA 0-5 0 - 5 WBC/hpf   Bacteria, UA RARE (A) NONE SEEN   Squamous Epithelial / LPF 0-5 0 - 5   Mucus PRESENT    Hyaline Casts, UA PRESENT     Comment: Performed at [facility], [address] [place], [name] [address]   Ct Abdomen Pelvis W Contrast  Result Date: 06/11/2019 CLINICAL DATA:  Right upper quadrant pain and vomiting. EXAM: CT ABDOMEN AND PELVIS WITH CONTRAST TECHNIQUE: Multidetector CT imaging of the abdomen and pelvis was performed using the standard protocol following bolus administration of intravenous contrast. CONTRAST:  153mL OMNIPAQUE IOHEXOL 300 MG/ML  SOLN COMPARISON:  None. FINDINGS: Lower chest: Unremarkable. Hepatobiliary: Geographic low attenuation within the liver parenchyma is probably fatty deposition. Focal 14 mm low-density lesion identified in the right liver on 16/2. There is no evidence for gallstones, gallbladder wall thickening, or pericholecystic fluid. No intrahepatic or extrahepatic biliary dilation. Pancreas: No focal mass lesion. No dilatation of the main duct. No intraparenchymal cyst. No peripancreatic edema. Spleen: No splenomegaly. No focal mass  lesion. Adrenals/Urinary Tract: No adrenal nodule or mass. 4 mm hypodensity in the interpolar right kidney is too small to characterize 10 mm low-density cortical lesion noted upper pole left kidney with another 6 mm hypodensity in the interpolar region of the left kidney. No evidence for hydroureter. The urinary bladder appears normal for the degree of distention. Stomach/Bowel: Stomach is unremarkable. No gastric wall thickening. No evidence of outlet obstruction. Duodenum is normally positioned as is the ligament of Treitz. There is marked small-bowel obstruction with mid to distal small bowel measuring up to 5.8 cm diameter. These dilated bowel loops are fluid-filled. In the right abdomen, the abnormal bowel gradually tapers and begins demonstrating circumferential wall thickening. The abnormal small bowel demonstrates an abrupt transition (visible on axial 53/2 and coronal 41/5) immediately adjacent to central mesenteric desmoplastic response. This desmoplasia is centered on a 3.8 x 2.3 x 2.8 cm mesenteric soft tissue lesion with several associated punctate calcifications. Small bowel in this region is tethered to the mesenteric nodule in there are numerous adjacent small mesenteric lymph nodes. The distal and terminal ileum is decompressed. Colon is diffusely decompressed. Vascular/Lymphatic: There is abdominal aortic atherosclerosis without aneurysm. No gastrohepatic or hepato duodenal ligament lymphadenopathy. As above, numerous small nodes are seen in the mesentery of the right abdomen. No pelvic sidewall lymphadenopathy. Reproductive: Prostate gland is mildly enlarged. Other: Small volume interloop mesenteric fluid noted without substantial free fluid in the pelvis. Musculoskeletal: 6 mm sclerotic focus noted in the T12 vertebral body. Tiny sclerotic foci seen in each femoral head. Small bilateral groin hernias contain only fat. IMPRESSION: 1. High-grade small bowel obstruction secondary to desmoplastic  response in the central right abdominal mesentery centered on a 4 cm calcified irregular/spiculated soft tissue lesion. Abrupt small bowel transition zone is identified immediately lead adjacent to this mesenteric lesion and multiple adjacent bowel loops are tethered into this region with mesenteric edema/congestion. The loop of small bowel immediately proximal to the transition zone shows  mild circumferential wall thickening but no pneumatosis. 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 2. Small lymph nodes in the abnormal right mesentery suggest additional metastatic involvement. 3. Heterogeneous liver parenchyma, likely secondary to geographic fatty deposition. There is a focal 14 mm low-density lesion in the right liver. The patient had an MRI in the [facility] system on 08/09/2013 to evaluate a right liver lesion, but those images are not available. Follow-up MRI without and with contrast recommended to exclude metastatic disease. 4. Tiny sclerotic foci in the T12 vertebral body in both femoral heads are likely benign, but close attention on follow-up recommended. 5.  Aortic Atherosclerois (ICD10-170.0) Electronically Signed   By: Misty Stanley M.D.   On: 06/11/2019 13:34   US Abdomen Limited  Result Date: 06/11/2019 CLINICAL DATA:  Upper abdominal pain EXAM: ULTRASOUND ABDOMEN LIMITED RIGHT UPPER QUADRANT COMPARISON:  Report of prior abdominal MRI August 09, 2013 available; images from that study cannot be retrieved. FINDINGS: Gallbladder: No gallstones or wall thickening visualized. No pericholecystic fluid evident. No sonographic Murphy sign noted by sonographer. Common bile duct: Diameter: 3 mm. No intrahepatic or extrahepatic biliary duct dilatation evident. Note that much of the common bile duct is obscured by gas. Liver: There is a solid mass in the right lobe of the liver measuring 4.4 x 4.9 x 4.0 cm which does not  have a typical hemangioma appearance. No other focal liver lesion is evident on this study. There is diffuse increase in liver echogenicity, a finding indicative of hepatic steatosis. Portal vein is patent on color Doppler imaging with normal direction of blood flow towards the liver. Other: Multiple fluid-filled loops of bowel are noted throughout the upper abdomen. IMPRESSION: 1. There is a solid mass in the right lobe of the liver which based on current measurements may have enlarged since the prior study from 2015. Images from the previous MR at that time cannot be retrieved. Given this circumstance, it may be prudent to correlate with pre and serial post-contrast MR or CT of the liver to further evaluate. There is underlying diffuse increase in liver echogenicity, a finding indicative of hepatic steatosis. 2. Multiple loops of fluid-filled bowel. Question a degree of ileus or enteritis. 3. Study otherwise unremarkable. Note that much of the common bile duct is obscured by gas. Electronically Signed   By: Lowella Grip III M.D.   On: 06/11/2019 11:09      Assessment/Plan  Bowel obstruction probably due to neoplasm with adhesions;  This obstruction has occurred over the last month.  Will place NG tube and assess for laparotomy and resection/bypass.     Matt B. Hassell Done, MD, [facility].     Earnstine Regal [facility] 06/11/2019, 2:41 PM Please see Amion for pager number during day hours 7:00am-4:30pm

## 2019-06-11 NOTE — ED Notes (Signed)
Pt unable to urinate at this time.  

## 2019-06-11 NOTE — ED Provider Notes (Signed)
[place] EMERGENCY DEPARTMENT Provider Note   CSN: [id number] Arrival date & time: 06/11/19  0913     History   Chief Complaint Chief Complaint  Patient presents with   Abdominal Pain    HPI Dwayne Sawyer is a 48 y.o. male with PMH significant for HTN, HLD, and RA who presents to the ED with a 5-week history of RUQ abdominal pain with nausea and loose stools.  Patient reports that he first noticed his abdominal discomfort after eating out at a steak house and that has since persisted.  Aggravating factors include eating meals and lying back flat.  He has had insurance issues and for that reason is no longer taking his medications for his HTN, HLD, and RA.  He denies any fevers or chills, headache or dizziness, shortness of breath, cough, or urinary symptoms.     HPI  Past Medical History:  Diagnosis Date   Arthritis    High cholesterol    Hypertension     Patient Active Problem List   Diagnosis Date Noted   Bowel obstruction ([place]) 06/11/2019    Past Surgical History:  Procedure Laterality Date   HERNIA REPAIR     SHOULDER SURGERY          Home Medications    Prior to Admission medications   Medication Sig Start Date End Date Taking? Authorizing Provider  amLODipine (NORVASC) 10 MG tablet TAKE 1 TABLET DAILY AS DIRECTED. 01/21/16  Yes [provider]  atorvastatin (LIPITOR) 10 MG tablet TAKE 1 TABLET AT BEDTIME 01/21/16  Yes [provider]  etanercept (ENBREL) 50 MG/ML injection INJECT 50 MG SUBCUTANEOUSLY EVERY WEEK 02/28/16  Yes [provider]  lisinopril-hydrochlorothiazide (ZESTORETIC) 20-25 MG tablet TAKE ONE TABLET BY MOUTH ONE TIME DAILY 01/21/16  Yes [provider]  methotrexate (RHEUMATREX) 2.5 MG tablet TAKE 7 TABLETS BY MOUTH ONCE A WEEK 02/28/16  Yes [provider]    Family History No family history on file.  Social History Social History   Tobacco Use   Smoking status: Never  Smoker   Smokeless tobacco: Current User    Types: Chew  Substance Use Topics   Alcohol use: Yes    Comment: occ   Drug use: Never     Allergies   Patient has no known allergies.   Review of Systems Review of Systems  All other systems reviewed and are negative.    Physical Exam Updated Vital Signs BP (S) (!) 194/140 (BP Location: Right Arm) Comment: HAS NOT HAD BP MED IN MONTHS   Pulse (!) 114    Temp 98.7 F (37.1 C) (Oral)    Resp 16    Ht 5\' 9"  (1.753 m)    Wt 105.8 kg    SpO2 100%    BMI 34.44 kg/m   Physical Exam Vitals signs and nursing note reviewed. Exam conducted with a chaperone present.  Constitutional:      Appearance: Normal appearance.  HENT:     Head: Normocephalic and atraumatic.  Eyes:     General: No scleral icterus.    Conjunctiva/sclera: Conjunctivae normal.  Cardiovascular:     Rate and Rhythm: Normal rate and regular rhythm.  Pulmonary:     Effort: Pulmonary effort is normal.     Breath sounds: Normal breath sounds.  Abdominal:     Comments: Soft, nondistended.  RUQ and epigastric TTP.  No significant TTP elsewhere.  No overlying skin changes.  No guarding.  Questionable Murphy sign.  Skin:    General: Skin is dry.  Neurological:     Mental Status: He is alert.     GCS: GCS eye subscore is 4. GCS verbal subscore is 5. GCS motor subscore is 6.  Psychiatric:        Mood and Affect: Mood normal.        Behavior: Behavior normal.        Thought Content: Thought content normal.      ED Treatments / Results  Labs (all labs ordered are listed, but only abnormal results are displayed) Labs Reviewed  CBC WITH DIFFERENTIAL/PLATELET - Abnormal; Notable for the following components:      Result Value   RBC 6.23 (*)    HCT 52.2 (*)    All other components within normal limits  COMPREHENSIVE METABOLIC PANEL - Abnormal; Notable for the following components:   Potassium 3.0 (*)    Chloride 97 (*)    Glucose, Bld 109 (*)    Creatinine, Ser  1.62 (*)    GFR calc non Af Amer 49 (*)    GFR calc Af Amer 57 (*)    All other components within normal limits  URINALYSIS, ROUTINE W REFLEX MICROSCOPIC - Abnormal; Notable for the following components:   Bilirubin Urine SMALL (*)    Ketones, ur 40 (*)    Protein, ur 30 (*)    All other components within normal limits  URINALYSIS, MICROSCOPIC (REFLEX) - Abnormal; Notable for the following components:   Bacteria, UA RARE (*)    All other components within normal limits  SARS CORONAVIRUS 2 (TAT 6-24 HRS)  LIPASE, BLOOD    EKG None  Radiology Ct Abdomen Pelvis W Contrast  Result Date: 06/11/2019 CLINICAL DATA:  Right upper quadrant pain and vomiting. EXAM: CT ABDOMEN AND PELVIS WITH CONTRAST TECHNIQUE: Multidetector CT imaging of the abdomen and pelvis was performed using the standard protocol following bolus administration of intravenous contrast. CONTRAST:  179mL OMNIPAQUE IOHEXOL 300 MG/ML  SOLN COMPARISON:  None. FINDINGS: Lower chest: Unremarkable. Hepatobiliary: Geographic low attenuation within the liver parenchyma is probably fatty deposition. Focal 14 mm low-density lesion identified in the right liver on 16/2. There is no evidence for gallstones, gallbladder wall thickening, or pericholecystic fluid. No intrahepatic or extrahepatic biliary dilation. Pancreas: No focal mass lesion. No dilatation of the main duct. No intraparenchymal cyst. No peripancreatic edema. Spleen: No splenomegaly. No focal mass lesion. Adrenals/Urinary Tract: No adrenal nodule or mass. 4 mm hypodensity in the interpolar right kidney is too small to characterize 10 mm low-density cortical lesion noted upper pole left kidney with another 6 mm hypodensity in the interpolar region of the left kidney. No evidence for hydroureter. The urinary bladder appears normal for the degree of distention. Stomach/Bowel: Stomach is unremarkable. No gastric wall thickening. No evidence of outlet obstruction. Duodenum is normally  positioned as is the ligament of Treitz. There is marked small-bowel obstruction with mid to distal small bowel measuring up to 5.8 cm diameter. These dilated bowel loops are fluid-filled. In the right abdomen, the abnormal bowel gradually tapers and begins demonstrating circumferential wall thickening. The abnormal small bowel demonstrates an abrupt transition (visible on axial 53/2 and coronal 41/5) immediately adjacent to central mesenteric desmoplastic response. This desmoplasia is centered on a 3.8 x 2.3 x 2.8 cm mesenteric soft tissue lesion with several associated punctate calcifications. Small bowel in this region is tethered to the mesenteric nodule in there are numerous adjacent small mesenteric lymph nodes. The distal and  terminal ileum is decompressed. Colon is diffusely decompressed. Vascular/Lymphatic: There is abdominal aortic atherosclerosis without aneurysm. No gastrohepatic or hepato duodenal ligament lymphadenopathy. As above, numerous small nodes are seen in the mesentery of the right abdomen. No pelvic sidewall lymphadenopathy. Reproductive: Prostate gland is mildly enlarged. Other: Small volume interloop mesenteric fluid noted without substantial free fluid in the pelvis. Musculoskeletal: 6 mm sclerotic focus noted in the T12 vertebral body. Tiny sclerotic foci seen in each femoral head. Small bilateral groin hernias contain only fat. IMPRESSION: 1. High-grade small bowel obstruction secondary to desmoplastic response in the central right abdominal mesentery centered on a 4 cm calcified irregular/spiculated soft tissue lesion. Abrupt small bowel transition zone is identified immediately lead adjacent to this mesenteric lesion and multiple adjacent bowel loops are tethered into this region with mesenteric edema/congestion. The loop of small bowel immediately proximal to the transition zone shows mild circumferential wall thickening but no pneumatosis. 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 2. Small lymph nodes in the abnormal right mesentery suggest additional metastatic involvement. 3. Heterogeneous liver parenchyma, likely secondary to geographic fatty deposition. There is a focal 14 mm low-density lesion in the right liver. The patient had an MRI in the [facility] on 08/09/2013 to evaluate a right liver lesion, but those images are not available. Follow-up MRI without and with contrast recommended to exclude metastatic disease. 4. Tiny sclerotic foci in the T12 vertebral body in both femoral heads are likely benign, but close attention on follow-up recommended. 5.  Aortic Atherosclerois (ICD10-170.0) Electronically Signed   By: Misty Stanley M.D.   On: 06/11/2019 13:34   US Abdomen Limited  Result Date: 06/11/2019 CLINICAL DATA:  Upper abdominal pain EXAM: ULTRASOUND ABDOMEN LIMITED RIGHT UPPER QUADRANT COMPARISON:  Report of prior abdominal MRI August 09, 2013 available; images from that study cannot be retrieved. FINDINGS: Gallbladder: No gallstones or wall thickening visualized. No pericholecystic fluid evident. No sonographic Murphy sign noted by sonographer. Common bile duct: Diameter: 3 mm. No intrahepatic or extrahepatic biliary duct dilatation evident. Note that much of the common bile duct is obscured by gas. Liver: There is a solid mass in the right lobe of the liver measuring 4.4 x 4.9 x 4.0 cm which does not have a typical hemangioma appearance. No other focal liver lesion is evident on this study. There is diffuse increase in liver echogenicity, a finding indicative of hepatic steatosis. Portal vein is patent on color Doppler imaging with normal direction of blood flow towards the liver. Other: Multiple fluid-filled loops of bowel are noted throughout the upper abdomen. IMPRESSION: 1. There is a solid mass in the right lobe of the liver which based on current measurements may have enlarged  since the prior study from 2015. Images from the previous MR at that time cannot be retrieved. Given this circumstance, it may be prudent to correlate with pre and serial post-contrast MR or CT of the liver to further evaluate. There is underlying diffuse increase in liver echogenicity, a finding indicative of hepatic steatosis. 2. Multiple loops of fluid-filled bowel. Question a degree of ileus or enteritis. 3. Study otherwise unremarkable. Note that much of the common bile duct is obscured by gas. Electronically Signed   By: Lowella Grip III M.D.   On: 06/11/2019 11:09    Procedures .Critical Care Performed by: Corena Herter, PA-C Authorized by: Corena Herter, PA-C   Critical care provider statement:    Critical  care time (minutes):  45   Critical care was necessary to treat or prevent imminent or life-threatening deterioration of the following conditions: High grade small bowel obstruction requiring emergent intervention.   Critical care was time spent personally by me on the following activities:  Discussions with consultants, evaluation of patient's response to treatment, examination of patient, ordering and performing treatments and interventions, ordering and review of laboratory studies, ordering and review of radiographic studies, re-evaluation of patient's condition, obtaining history from patient or surrogate and review of old charts   (including critical care time)  Medications Ordered in ED Medications  sodium chloride 0.9 % bolus 1,000 mL (0 mLs Intravenous Stopped 06/11/19 1314)  potassium chloride SA (KLOR-CON) CR tablet 40 mEq (40 mEq Oral Given 06/11/19 1139)  iohexol (OMNIPAQUE) 300 MG/ML solution 100 mL (100 mLs Intravenous Contrast Given 06/11/19 1302)     Initial Impression / Assessment and Plan / ED Course  I have reviewed the triage vital signs and the nursing notes.  Pertinent labs & imaging results that were available during my care of the patient were  reviewed by me and considered in my medical decision making (see chart for details).        CMP was interpreted and is notable for hypokalemia at 3.0 as well as impaired renal function.  Reviewed patient's past medical record and he had a creatinine of 1.19 and normal GFR > 60 on 02/28/2016.  Given the significant length of time between chemistry panels, unsure as to whether or not this is an acute kidney injury.  Right upper quadrant ultrasound demonstrated no evidence of gallstones or cholecystitis, but noted a 4 x 5 cm mass in the right lobe of the liver as well as evidence of hepatic steatosis.  Will obtain CT abdomen and pelvis with contrast for further evaluation.     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  Will consult general surgery and then have patient admitted.  Patient denies any current nausea or pain.   2:40 PM Spoke with Dr. Barbee Cough and he advised that we get him admitted to [facility] for further work-up and evaluation.  Will consult hospitalist group.   2:40 PM Spoke with [facility] hospitalists who will have patient admitted.    Final Clinical Impressions(s) / ED Diagnoses   Final diagnoses:  Small bowel obstruction [facility])    ED Discharge Orders    None       Corena Herter, PA-C 06/11/19 1511    Maudie Flakes, MD 06/12/19 [phone]

## 2019-06-11 NOTE — H&P (Addendum)
History and Physical    Dwayne Sawyer [id number] DOB: Mar 16, 1971 DOA: 06/11/2019  PCP: [facility] [place] [facility] Patient coming from: Home.  Very functional and independent  Chief Complaint: Abdominal pain  HPI: Dwayne Sawyer is a 48 y.o. male with history of RA on MTX and Enbrel in the past, HTN and HLD not on medication, and inguinal hernia repair presenting with abdominal pain for 6 weeks.  History provided by patient and patient's wife at bedside.  Patient first noticed abdominal pain after eating out at a steak house about 6 weeks ago.  Pain persisted since that time and gotten worse that prompted him to come to ED.  Pain is mainly on the right side of his abdomen but radiates across upper abdomen to the left.  He describes the pain as cramping, sometimes sharp and burning.  Pain is intermittent.  Severity 9/10 at its worst.  Currently about 4-5/10.  Pain is aggravated by eating and lying flat.  No alleviating factor.  He reports associated nausea but no emesis.  He also reports watery bowel movements almost every day.  He said his stool looked pale.  But denies melena or hematochezia.  Last bowel movement this morning which was watery.  He said the color of his stool improved this morning.  He denies recent illness, fever, chills, excessive night sweats, chest pain, dyspnea, cough, dysuria or hematuria.  He denies headache, vision change, focal numbness, tingling or weakness.  He reports about 70 pound weight loss in the last one and half year but intentionally up until 6 weeks ago.  Reports history of GI bleed about 10 years ago.  Had colonoscopy at that time that was negative.  He said he had repeat colonoscopy in [place] about 5 years ago and was told to be normal.   He has history of rheumatoid arthritis but has not taken medications in the last 1 year.  He also have history of HTN and HLD but has not taken medication in almost 8 months.  He denies history of heart  disease, stroke, COPD, diabetes or kidney disease.  Denies family history of colon cancer but reports carcinoid tumor in his father about mid 5s.  He says he does not know much about maternal history.  He lives with his wife.  Denies smoking cigarettes but uses dips.  Denies recreational drug use.  Admits to occasional alcohol.  In [facility] ED, hypertensive to 212/143 but improved to 168/132 without medication.  Tachycardic to 110s.  K3.0.  Creatinine 1.62.  Hgb 17.  HCT 52.  UA with 40 ketones and rare bacteria.  RUQ ultrasound with right lobe liver mass measuring 4.4 x 4.9 x 4.0 cm, multiple loops of fluid-filled bowels but not able to visualize common bile duct obscured by gas.  CT abdomen and pelvis revealed high-grade SBO secondary to desmoplastic response in the central right abdominal mesentery centered on a 4 cm calcified irregular/spiculated soft tissue lesion with abrupt transition zone, small lymph nodes in the abnormal right mesentery suggesting additional metastatic involvement and heterogeneous liver parenchyma likely due to geographic fatty deposition and a focal 14 mm low-density lesion in the right liver, tiny sclerotic foci in T12 vertebral body and both femoral heads.  General surgery consulted and hospitalist service called for admission.  ROS All review of system negative except for pertinent positives and negatives as history of present illness above. PMH Past Medical History:  Diagnosis Date  . Rheumatoid arthritis   . High cholesterol   .  Hypertension    PSH Past Surgical History:  Procedure Laterality Date  . HERNIA REPAIR    . SHOULDER SURGERY     Fam HX Carcinoid in his father in mid 47s  Social Hx As in HPI  Allergy No Known Allergies to medications  Home Meds Prior to Admission medications   Medication Sig Start Date End Date Taking? Authorizing Provider  amLODipine (NORVASC) 10 MG tablet TAKE 1 TABLET DAILY AS DIRECTED. 01/21/16  Yes [provider]  atorvastatin (LIPITOR) 10 MG tablet TAKE 1 TABLET AT BEDTIME 01/21/16  Yes [provider]  etanercept (ENBREL) 50 MG/ML injection INJECT 50 MG SUBCUTANEOUSLY EVERY WEEK 02/28/16  Yes [provider]  lisinopril-hydrochlorothiazide (ZESTORETIC) 20-25 MG tablet TAKE ONE TABLET BY MOUTH ONE TIME DAILY 01/21/16  Yes [provider]  methotrexate (RHEUMATREX) 2.5 MG tablet TAKE 7 TABLETS BY MOUTH ONCE A WEEK 02/28/16  Yes [provider]    Physical Exam: Vitals:   06/11/19 0928 06/11/19 0929 06/11/19 1500 06/11/19 1647  BP: (!) 205/145 (S) (!) 194/140 (!) 178/134 (!) 168/132  Pulse:   (!) 103 (!) 101  Resp:   14 16  Temp:    99.2 F (37.3 C)  TempSrc:    Oral  SpO2:   97% 100%  Weight:      Height:        GENERAL: No acute distress.  Appears well.  HEENT: MMM.  Vision and hearing grossly intact.  NECK: Supple.  No apparent JVD.  RESP:  No IWOB. Good air movement bilaterally. CVS: Tachycardic. Heart sounds normal.  ABD/GI/GU: Hyperactive bowel sounds.  Some distention and diffuse tenderness MSK/EXT:  Moves extremities. No apparent deformity or edema.  SKIN: no apparent skin lesion or wound NEURO: Awake, alert and oriented appropriately.  No gross deficit.  PSYCH: Calm. Normal affect.   Personally Reviewed Radiological Exams Ct Abdomen Pelvis W Contrast  Result Date: 06/11/2019 CLINICAL DATA:  Right upper quadrant pain and vomiting. EXAM: CT ABDOMEN AND PELVIS WITH CONTRAST TECHNIQUE: Multidetector CT imaging of the abdomen and pelvis was performed using the standard protocol following bolus administration of intravenous contrast. CONTRAST:  131mL OMNIPAQUE IOHEXOL 300 MG/ML  SOLN COMPARISON:  None. FINDINGS: Lower chest: Unremarkable. Hepatobiliary: Geographic low attenuation within the liver parenchyma is probably fatty deposition. Focal 14 mm low-density lesion identified in the right liver on 16/2. There is no evidence for gallstones, gallbladder  wall thickening, or pericholecystic fluid. No intrahepatic or extrahepatic biliary dilation. Pancreas: No focal mass lesion. No dilatation of the main duct. No intraparenchymal cyst. No peripancreatic edema. Spleen: No splenomegaly. No focal mass lesion. Adrenals/Urinary Tract: No adrenal nodule or mass. 4 mm hypodensity in the interpolar right kidney is too small to characterize 10 mm low-density cortical lesion noted upper pole left kidney with another 6 mm hypodensity in the interpolar region of the left kidney. No evidence for hydroureter. The urinary bladder appears normal for the degree of distention. Stomach/Bowel: Stomach is unremarkable. No gastric wall thickening. No evidence of outlet obstruction. Duodenum is normally positioned as is the ligament of Treitz. There is marked small-bowel obstruction with mid to distal small bowel measuring up to 5.8 cm diameter. These dilated bowel loops are fluid-filled. In the right abdomen, the abnormal bowel gradually tapers and begins demonstrating circumferential wall thickening. The abnormal small bowel demonstrates an abrupt transition (visible on axial 53/2 and coronal 41/5) immediately adjacent to central mesenteric desmoplastic response. This desmoplasia is centered on a 3.8 x  2.3 x 2.8 cm mesenteric soft tissue lesion with several associated punctate calcifications. Small bowel in this region is tethered to the mesenteric nodule in there are numerous adjacent small mesenteric lymph nodes. The distal and terminal ileum is decompressed. Colon is diffusely decompressed. Vascular/Lymphatic: There is abdominal aortic atherosclerosis without aneurysm. No gastrohepatic or hepato duodenal ligament lymphadenopathy. As above, numerous small nodes are seen in the mesentery of the right abdomen. No pelvic sidewall lymphadenopathy. Reproductive: Prostate gland is mildly enlarged. Other: Small volume interloop mesenteric fluid noted without substantial free fluid in the  pelvis. Musculoskeletal: 6 mm sclerotic focus noted in the T12 vertebral body. Tiny sclerotic foci seen in each femoral head. Small bilateral groin hernias contain only fat. IMPRESSION: 1. High-grade small bowel obstruction secondary to desmoplastic response in the central right abdominal mesentery centered on a 4 cm calcified irregular/spiculated soft tissue lesion. Abrupt small bowel transition zone is identified immediately lead adjacent to this mesenteric lesion and multiple adjacent bowel loops are tethered into this region with mesenteric edema/congestion. The loop of small bowel immediately proximal to the transition zone shows mild circumferential wall thickening but no pneumatosis. 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 2. Small lymph nodes in the abnormal right mesentery suggest additional metastatic involvement. 3. Heterogeneous liver parenchyma, likely secondary to geographic fatty deposition. There is a focal 14 mm low-density lesion in the right liver. The patient had an MRI in the [facility] system on 08/09/2013 to evaluate a right liver lesion, but those images are not available. Follow-up MRI without and with contrast recommended to exclude metastatic disease. 4. Tiny sclerotic foci in the T12 vertebral body in both femoral heads are likely benign, but close attention on follow-up recommended. 5.  Aortic Atherosclerois (ICD10-170.0) Electronically Signed   By: Misty Stanley M.D.   On: 06/11/2019 13:34   US Abdomen Limited  Result Date: 06/11/2019 CLINICAL DATA:  Upper abdominal pain EXAM: ULTRASOUND ABDOMEN LIMITED RIGHT UPPER QUADRANT COMPARISON:  Report of prior abdominal MRI August 09, 2013 available; images from that study cannot be retrieved. FINDINGS: Gallbladder: No gallstones or wall thickening visualized. No pericholecystic fluid evident. No sonographic Murphy sign noted by sonographer. Common bile  duct: Diameter: 3 mm. No intrahepatic or extrahepatic biliary duct dilatation evident. Note that much of the common bile duct is obscured by gas. Liver: There is a solid mass in the right lobe of the liver measuring 4.4 x 4.9 x 4.0 cm which does not have a typical hemangioma appearance. No other focal liver lesion is evident on this study. There is diffuse increase in liver echogenicity, a finding indicative of hepatic steatosis. Portal vein is patent on color Doppler imaging with normal direction of blood flow towards the liver. Other: Multiple fluid-filled loops of bowel are noted throughout the upper abdomen. IMPRESSION: 1. There is a solid mass in the right lobe of the liver which based on current measurements may have enlarged since the prior study from 2015. Images from the previous MR at that time cannot be retrieved. Given this circumstance, it may be prudent to correlate with pre and serial post-contrast MR or CT of the liver to further evaluate. There is underlying diffuse increase in liver echogenicity, a finding indicative of hepatic steatosis. 2. Multiple loops of fluid-filled bowel. Question a degree of ileus or enteritis. 3. Study otherwise unremarkable. Note that much of the common bile duct is obscured by gas. Electronically Signed   By: Gwyndolyn Saxon  Jasmine December III M.D.   On: 06/11/2019 11:09     Personally Reviewed Labs: CBC: Recent Labs  Lab 06/11/19 0947  WBC 7.8  NEUTROABS 5.9  HGB 17.0  HCT 52.2*  MCV 83.8  PLT [id number]   Basic Metabolic Panel: Recent Labs  Lab 06/11/19 0947  NA 137  K 3.0*  CL 97*  CO2 27  GLUCOSE 109*  BUN 16  CREATININE 1.62*  CALCIUM 9.0   GFR: Estimated Creatinine Clearance: 65.4 mL/min (A) (by C-G formula based on SCr of 1.62 mg/dL (H)). Liver Function Tests: Recent Labs  Lab 06/11/19 0947  AST 19  ALT 25  ALKPHOS 123  BILITOT 1.1  PROT 7.9  ALBUMIN 4.2   Recent Labs  Lab 06/11/19 0947  LIPASE 31   No results for input(s): AMMONIA in  the last 168 hours. Coagulation Profile: No results for input(s): INR, PROTIME in the last 168 hours. Cardiac Enzymes: No results for input(s): CKTOTAL, CKMB, CKMBINDEX, TROPONINI in the last 168 hours. BNP (last 3 results) No results for input(s): PROBNP in the last 8760 hours. HbA1C: No results for input(s): HGBA1C in the last 72 hours. CBG: No results for input(s): GLUCAP in the last 168 hours. Lipid Profile: No results for input(s): CHOL, HDL, LDLCALC, TRIG, CHOLHDL, LDLDIRECT in the last 72 hours. Thyroid Function Tests: No results for input(s): TSH, T4TOTAL, FREET4, T3FREE, THYROIDAB in the last 72 hours. Anemia Panel: No results for input(s): VITAMINB12, FOLATE, FERRITIN, TIBC, IRON, RETICCTPCT in the last 72 hours. Urine analysis:    Component Value Date/Time   COLORURINE YELLOW 06/11/2019 [place] 06/11/2019 1314   LABSPEC 1.025 06/11/2019 1314   PHURINE 6.0 06/11/2019 1314   GLUCOSEU NEGATIVE 06/11/2019 1314   HGBUR NEGATIVE 06/11/2019 1314   BILIRUBINUR SMALL (A) 06/11/2019 1314   KETONESUR 40 (A) 06/11/2019 1314   PROTEINUR 30 (A) 06/11/2019 1314   NITRITE NEGATIVE 06/11/2019 1314   LEUKOCYTESUR NEGATIVE 06/11/2019 1314    Sepsis Labs:  None  Personally Reviewed EKG:  EKG not obtained.  Imaging RUQ ultrasound 06/11/2019 Right lobe liver mass measuring 4.4 x 4.9 x 4.0 cm, multiple loops of fluid-filled bowels but not able to visualize common bile duct obscured by gas.  Previously reported as  stable 3.2 x 2.5 cm indeterminate lesion on OSH MRI in 2015 thought to be benign due to lack of interval growth from MRI in 2014.  CT abdomen and pelvis 06/11/2019 Revealed high-grade SBO secondary to desmoplastic response in the central right abdominal mesentery centered on a 4 cm calcified irregular/spiculated soft tissue lesion with abrupt transition zone, small lymph nodes in the abnormal right mesentery suggesting additional metastatic involvement and  heterogeneous liver parenchyma likely due to geographic fatty deposition and a focal 14 mm low-density lesion in the right liver, tiny sclerotic foci in T12 vertebral body and both femoral heads.  MRI abdomen on 07/18/2016 [facility] Stable 3.2 x 2.5 cm indeterminate lesion on OSH MRI in 2015 thought to be benign due to lack of interval growth from MRI in 2014  Assessment/Plan High-grade SBO/nausea/abdominal pain: noted on CT abdomen and pelvis as above.  Reportedly had watery diarrhea up until earlier this morning. -General surgery consulted-started with conservative care-NGT, pain control, IV fluid and antiemetics -Monitor electrolytes and replenish as appropriate -Patient has history of rheumatoid arthritis-caution should be taken during intubation in case of surgery.  Mesenteric mass/mesenteric LAD/liver mass:  CT A/P and RUQ ultrasound as above.  Reportedly had normal  colonoscopy 5 years ago in [facility].  No constitutional symptoms other than intentional weight loss.  No personal or family history other than carcinoid tumor in his father in 55s. -Check CEA and CA 19-19 -Will need MRI for better characterization. -We will consult GI in the morning  Hypertensive urgency/tachycardia: BP elevated to 222/143 but improved to 116/132 without medication.  Has not taken antihypertensive medications in 8 months -As needed labetalol for SBP above 160 or DBP above 110  Elevated creatinine: Cr 1.19 (in 02/2016)> 1.62 (admit)-could be AKI or progression of hypertensive kidney disease.  Normal BUN favors progression of CKD. -IV fluid as above -Recheck in the morning  Polycythemia: Likely from hemoconcentration.  History of OSA? -Recheck CBC in the morning after hydration  Hypokalemia: Likely due to diarrhea -Replenish and recheck  Ketonuria: Likely due to dehydration -IV fluid as above  Sclerotic T12 and a lateral femoral head lesions -Malignancy work-up as above  History of rheumatoid  arthritis: Off medication for about a year.  No flareup. -Outpatient follow-up  DVT prophylaxis: Subcu heparin  Code Status: Full code-confirmed on admission. Family Communication: Updated patient's wife at bedside.  Disposition Plan: Admit to MedSurg Consults called: General surgery called by EDP Admission status: Inpatient   Mercy Riding MD [facility]  If 7PM-7AM, please contact night-coverage [email or web address] Password [facility]  06/11/2019, 5:45 PM

## 2019-06-11 NOTE — ED Triage Notes (Signed)
RUQ Pain and vomiting x5 weeks.  Pain is worse after eating.

## 2019-06-11 NOTE — Progress Notes (Signed)
MD paged regarding BP of 165/111 despite attempts to lower BP with PRN labetalol. Awaiting new orders.

## 2019-06-11 NOTE — Progress Notes (Signed)
Attempted to place NG tube pt ripped it out of nose. RN attempted to place again but pt refused and stated he would do it later. Pt educated on the importance of having NG tube pt continued to state he would do it later.

## 2019-06-11 NOTE — ED Notes (Signed)
Report given [facility]link by Ubaldo Glassing RN

## 2019-06-12 ENCOUNTER — Inpatient Hospital Stay (HOSPITAL_COMMUNITY): Payer: Self-pay | Admitting: Anesthesiology

## 2019-06-12 ENCOUNTER — Other Ambulatory Visit: Payer: Self-pay

## 2019-06-12 ENCOUNTER — Inpatient Hospital Stay (HOSPITAL_COMMUNITY): Payer: Self-pay

## 2019-06-12 ENCOUNTER — Encounter (HOSPITAL_COMMUNITY): Payer: Self-pay | Admitting: *Deleted

## 2019-06-12 ENCOUNTER — Encounter (HOSPITAL_COMMUNITY)
Admission: EM | Disposition: A | Discharge status: Home / Self Care | Payer: Self-pay | Source: Home / Self Care | Attending: Student

## 2019-06-12 DIAGNOSIS — E871 Hypo-osmolality and hyponatremia: Secondary | ICD-10-CM

## 2019-06-12 DIAGNOSIS — N179 Acute kidney failure, unspecified: Secondary | ICD-10-CM

## 2019-06-12 DIAGNOSIS — R000 Tachycardia, unspecified: Secondary | ICD-10-CM

## 2019-06-12 HISTORY — PX: SMALL INTESTINE SURGERY: SHX150

## 2019-06-12 HISTORY — PX: LAPAROTOMY: SHX154

## 2019-06-12 LAB — COMPREHENSIVE METABOLIC PANEL
ALT: 21 U/L (ref 0–44)
AST: 14 U/L — ABNORMAL LOW (ref 15–41)
Albumin: 3.4 g/dL — ABNORMAL LOW (ref 3.5–5.0)
Alkaline Phosphatase: 100 U/L (ref 38–126)
Anion gap: 10 (ref 5–15)
BUN: 15 mg/dL (ref 6–20)
CO2: 24 mmol/L (ref 22–32)
Calcium: 8.3 mg/dL — ABNORMAL LOW (ref 8.9–10.3)
Chloride: 99 mmol/L (ref 98–111)
Creatinine, Ser: 1.27 mg/dL — ABNORMAL HIGH (ref 0.61–1.24)
GFR calc Af Amer: >60 mL/min (ref >60)
GFR calc non Af Amer: >60 mL/min (ref >60)
Glucose, Bld: 111 mg/dL — ABNORMAL HIGH (ref 70–99)
Potassium: 3.7 mmol/L (ref 3.5–5.1)
Sodium: 133 mmol/L — ABNORMAL LOW (ref 135–145)
Total Bilirubin: 1.3 mg/dL — ABNORMAL HIGH (ref 0.3–1.2)
Total Protein: 6.3 g/dL — ABNORMAL LOW (ref 6.5–8.1)

## 2019-06-12 LAB — CBC
HCT: 47.1 % (ref 39.0–52.0)
Hemoglobin: 15.1 g/dL (ref 13.0–17.0)
MCH: 27.3 pg (ref 26.0–34.0)
MCHC: 32.1 g/dL (ref 30.0–36.0)
MCV: 85.2 fL (ref 80.0–100.0)
Platelets: 349 10*3/uL (ref 150–400)
RBC: 5.53 MIL/uL (ref 4.22–5.81)
RDW: 13.4 % (ref 11.5–15.5)
WBC: 7.3 10*3/uL (ref 4.0–10.5)
nRBC: 0 % (ref 0.0–0.2)

## 2019-06-12 LAB — CANCER ANTIGEN 19-9: CA 19-9: 38 U/mL — ABNORMAL HIGH (ref 0–35)

## 2019-06-12 LAB — HEMOGLOBIN A1C
Hgb A1c MFr Bld: 5.8 % — ABNORMAL HIGH (ref 4.8–5.6)
Mean Plasma Glucose: 119.76 mg/dL

## 2019-06-12 LAB — SURGICAL PCR SCREEN
MRSA, PCR: NEGATIVE
Staphylococcus aureus: NEGATIVE

## 2019-06-12 LAB — CEA: CEA: 2 ng/mL (ref 0.0–4.7)

## 2019-06-12 IMAGING — DX DG ABDOMEN 1V
3 series · 3 of 3 positions shown · non-contrast
Comparison: [DATE].

CLINICAL DATA: Abdominal pain.

EXAM:
ABDOMEN - 1 VIEW

[abdomen kub (1 of 3)]
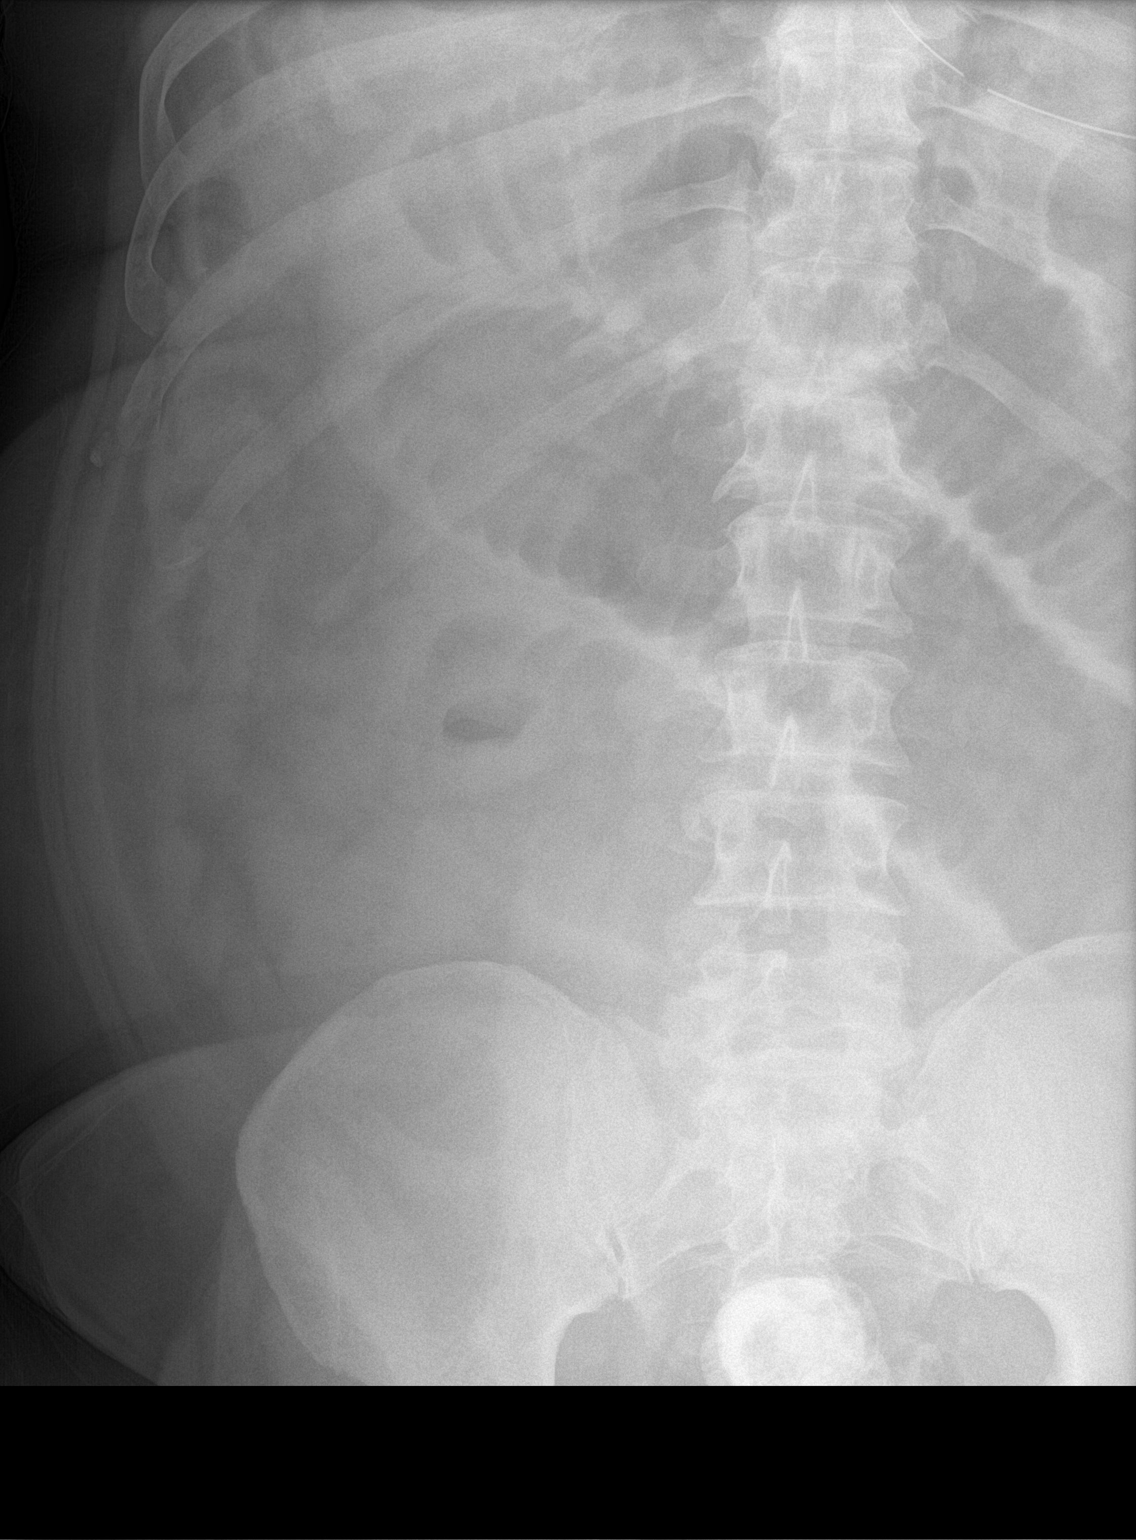

[abdomen kub (2 of 3)]
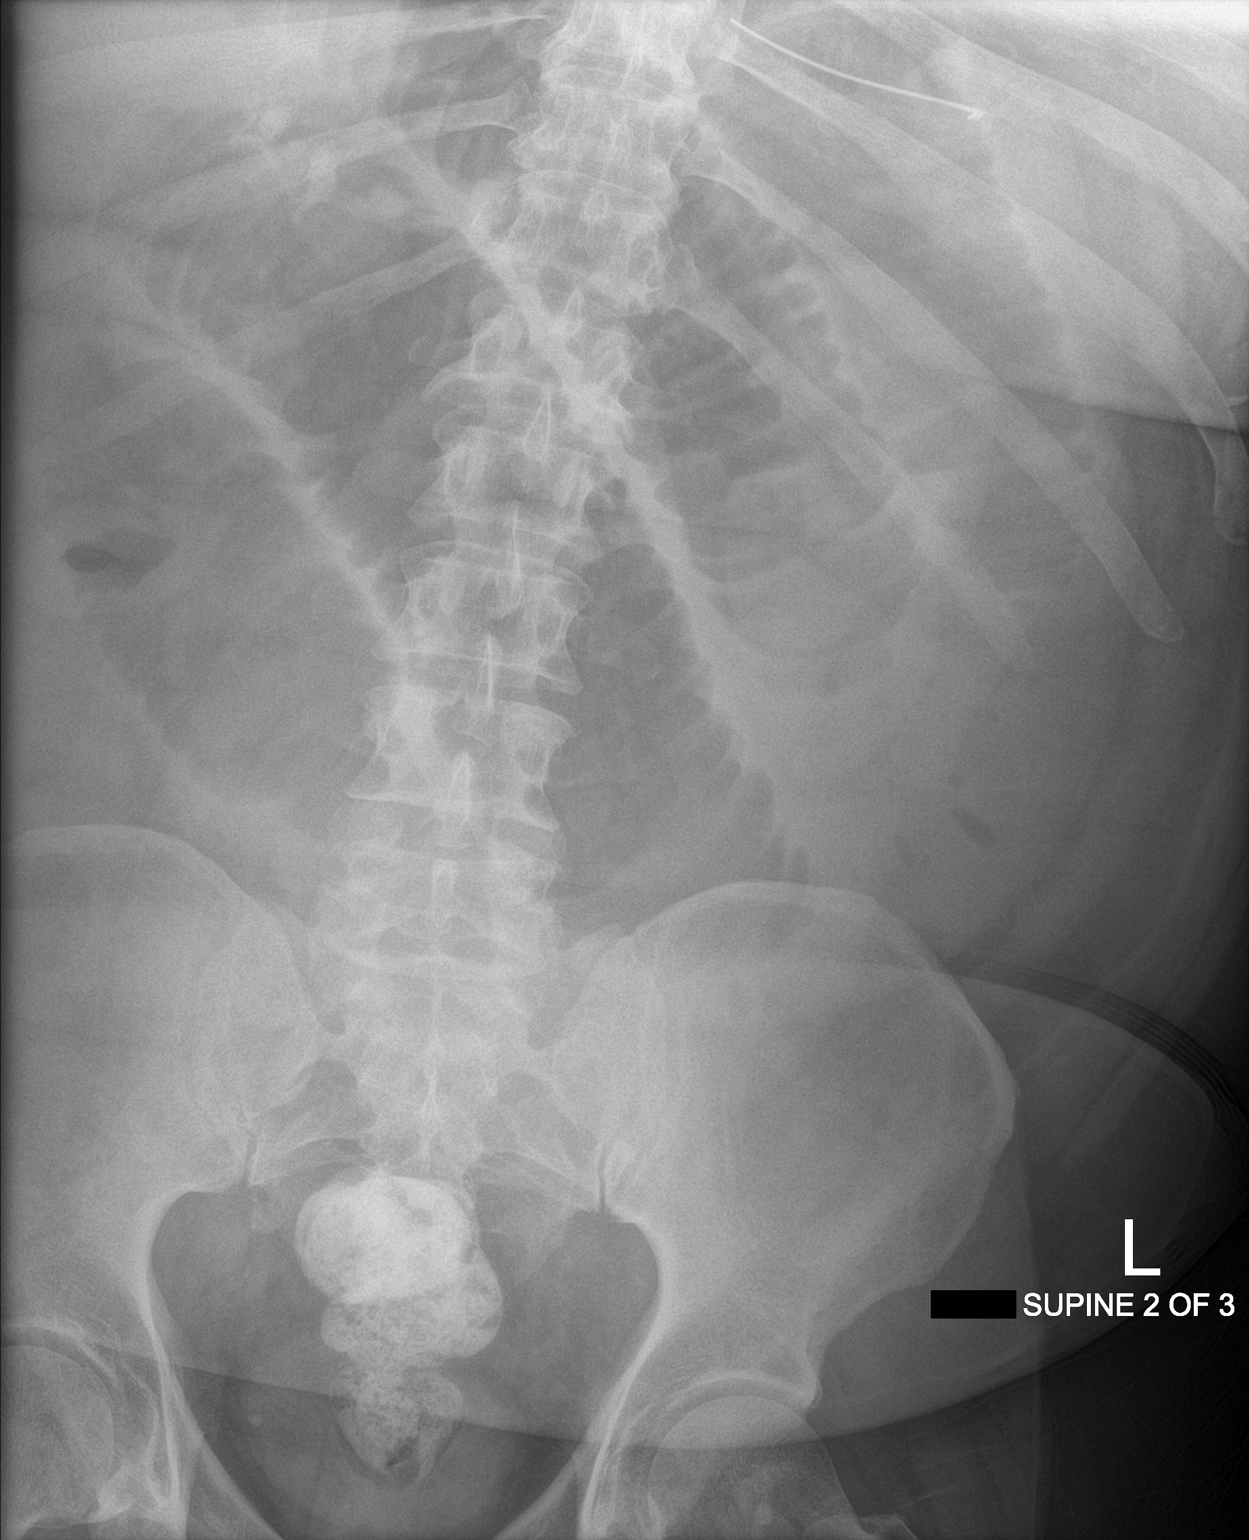

[abdomen kub (3 of 3)]
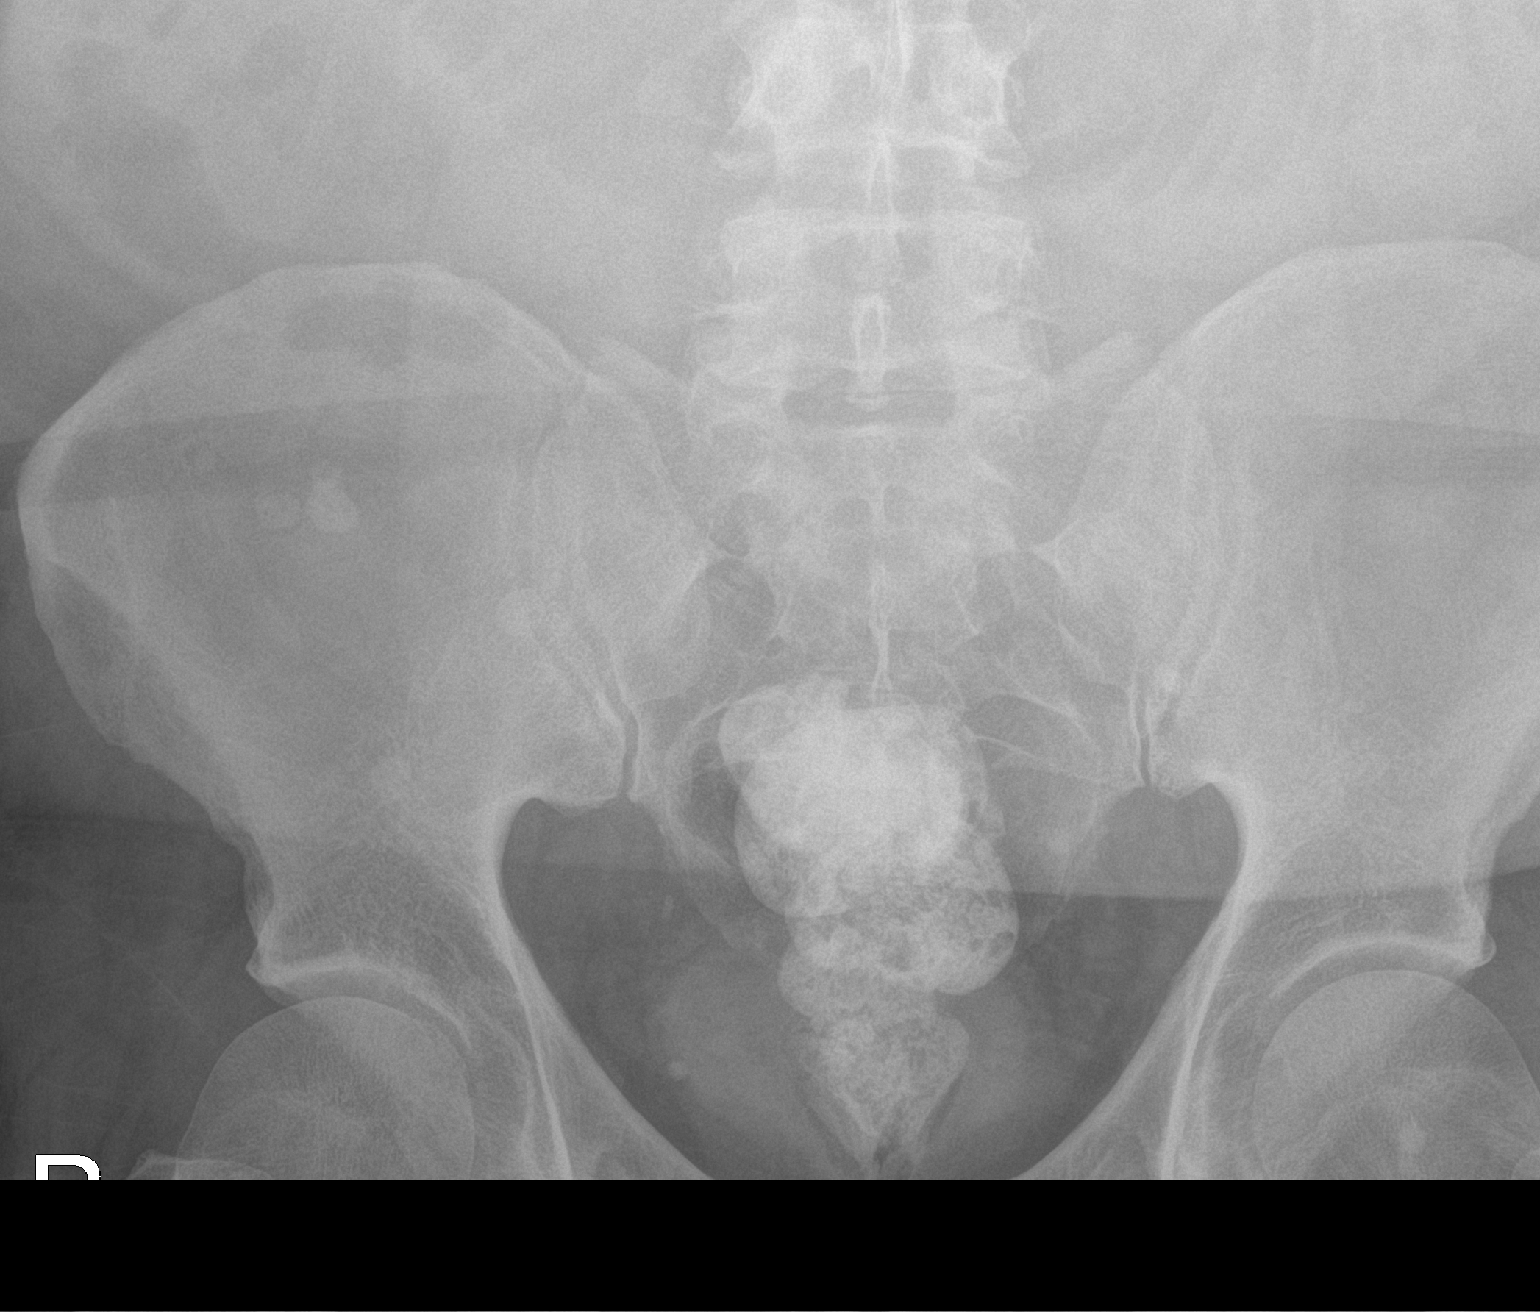

[3 of 3 positions shown; findings below may reference images not displayed]

FINDINGS: Stable dilated small bowel loops are noted concerning for distal
small bowel obstruction. Residual contrast and stool is noted in the
rectum. Nasogastric tube tip is seen in proximal stomach.
IMPRESSION: Stable dilated small bowel loops concerning for distal small bowel
obstruction.

## 2019-06-12 SURGERY — LAPAROTOMY, EXPLORATORY
Anesthesia: General | Site: Abdomen

## 2019-06-12 MED ORDER — FENTANYL CITRATE (PF) 250 MCG/5ML IJ SOLN
INTRAMUSCULAR | Status: DC | PRN
  Administered 2019-06-12 (×2): 50 ug via INTRAVENOUS
  Administered 2019-06-12: 100 ug via INTRAVENOUS

## 2019-06-12 MED ORDER — DEXAMETHASONE SODIUM PHOSPHATE 10 MG/ML IJ SOLN
INTRAMUSCULAR | Status: AC
  Filled 2019-06-12: qty 1

## 2019-06-12 MED ORDER — MIDAZOLAM HCL 5 MG/5ML IJ SOLN
INTRAMUSCULAR | Status: DC | PRN
  Administered 2019-06-12: 2 mg via INTRAVENOUS

## 2019-06-12 MED ORDER — SUGAMMADEX SODIUM 500 MG/5ML IV SOLN
INTRAVENOUS | Status: DC | PRN
  Administered 2019-06-12: 250 mg via INTRAVENOUS

## 2019-06-12 MED ORDER — LABETALOL HCL 5 MG/ML IV SOLN: 5.0000 mg | INTRAVENOUS | Status: DC | PRN

## 2019-06-12 MED ORDER — SODIUM CHLORIDE 0.9 % IV SOLN
2.0000 g | INTRAVENOUS | Status: AC
  Administered 2019-06-12: 2 g via INTRAVENOUS
  Filled 2019-06-12: qty 2

## 2019-06-12 MED ORDER — ONDANSETRON HCL 4 MG/2ML IJ SOLN: 4.0000 mg | Freq: Four times a day (QID) | INTRAMUSCULAR | Status: DC | PRN

## 2019-06-12 MED ORDER — SODIUM CHLORIDE 0.9 % IV SOLN
2.0000 g | Freq: Two times a day (BID) | INTRAVENOUS | Status: AC
  Administered 2019-06-13: 2 g via INTRAVENOUS
  Filled 2019-06-12: qty 2

## 2019-06-12 MED ORDER — HYDROMORPHONE HCL 1 MG/ML IJ SOLN
INTRAMUSCULAR | Status: AC
  Administered 2019-06-12: 0.5 mg via INTRAVENOUS
  Filled 2019-06-12: qty 1

## 2019-06-12 MED ORDER — HEPARIN SODIUM (PORCINE) 5000 UNIT/ML IJ SOLN
5000.0000 [IU] | Freq: Three times a day (TID) | INTRAMUSCULAR | Status: DC
  Administered 2019-06-12 – 2019-06-15 (×8): 5000 [IU] via SUBCUTANEOUS
  Filled 2019-06-12 (×9): qty 1

## 2019-06-12 MED ORDER — MIDAZOLAM HCL 2 MG/2ML IJ SOLN
INTRAMUSCULAR | Status: AC
  Filled 2019-06-12: qty 2

## 2019-06-12 MED ORDER — HYDROMORPHONE HCL 1 MG/ML IJ SOLN
0.2500 mg | INTRAMUSCULAR | Status: DC | PRN
  Administered 2019-06-12 (×3): 0.5 mg via INTRAVENOUS

## 2019-06-12 MED ORDER — MORPHINE SULFATE (PF) 2 MG/ML IV SOLN
1.0000 mg | INTRAVENOUS | Status: DC | PRN
  Administered 2019-06-12 – 2019-06-13 (×2): 1 mg via INTRAVENOUS
  Filled 2019-06-12 (×3): qty 1

## 2019-06-12 MED ORDER — LACTATED RINGERS IV SOLN
INTRAVENOUS | Status: DC
  Administered 2019-06-12 (×3): via INTRAVENOUS

## 2019-06-12 MED ORDER — LABETALOL HCL 5 MG/ML IV SOLN
INTRAVENOUS | Status: DC | PRN
  Administered 2019-06-12 (×2): 5 mg via INTRAVENOUS
  Administered 2019-06-12: 10 mg via INTRAVENOUS

## 2019-06-12 MED ORDER — 0.9 % SODIUM CHLORIDE (POUR BTL) OPTIME
TOPICAL | Status: DC | PRN
  Administered 2019-06-12: 2000 mL

## 2019-06-12 MED ORDER — SUCCINYLCHOLINE CHLORIDE 200 MG/10ML IV SOSY
PREFILLED_SYRINGE | INTRAVENOUS | Status: AC
  Filled 2019-06-12: qty 10

## 2019-06-12 MED ORDER — LIDOCAINE 2% (20 MG/ML) 5 ML SYRINGE
INTRAMUSCULAR | Status: AC
  Filled 2019-06-12: qty 5

## 2019-06-12 MED ORDER — ROCURONIUM BROMIDE 100 MG/10ML IV SOLN
INTRAVENOUS | Status: DC | PRN
  Administered 2019-06-12: 50 mg via INTRAVENOUS
  Administered 2019-06-12: 20 mg via INTRAVENOUS

## 2019-06-12 MED ORDER — SUCCINYLCHOLINE CHLORIDE 20 MG/ML IJ SOLN
INTRAMUSCULAR | Status: DC | PRN
  Administered 2019-06-12: 120 mg via INTRAVENOUS

## 2019-06-12 MED ORDER — KCL IN DEXTROSE-NACL 20-5-0.45 MEQ/L-%-% IV SOLN
INTRAVENOUS | Status: DC
  Administered 2019-06-12 – 2019-06-14 (×6): via INTRAVENOUS
  Filled 2019-06-12 (×5): qty 1000

## 2019-06-12 MED ORDER — ROCURONIUM BROMIDE 10 MG/ML (PF) SYRINGE
PREFILLED_SYRINGE | INTRAVENOUS | Status: AC
  Filled 2019-06-12: qty 10

## 2019-06-12 MED ORDER — ONDANSETRON 4 MG PO TBDP: 4.0000 mg | ORAL_TABLET | Freq: Four times a day (QID) | ORAL | Status: DC | PRN

## 2019-06-12 MED ORDER — HYDROMORPHONE HCL 1 MG/ML IJ SOLN
INTRAMUSCULAR | Status: AC
  Filled 2019-06-12: qty 1

## 2019-06-12 MED ORDER — LABETALOL HCL 5 MG/ML IV SOLN
5.0000 mg | INTRAVENOUS | Status: AC | PRN
  Administered 2019-06-12 (×3): 5 mg via INTRAVENOUS

## 2019-06-12 MED ORDER — PROPOFOL 10 MG/ML IV BOLUS
INTRAVENOUS | Status: AC
  Filled 2019-06-12: qty 20

## 2019-06-12 MED ORDER — PANTOPRAZOLE SODIUM 40 MG IV SOLR
40.0000 mg | Freq: Every day | INTRAVENOUS | Status: DC
  Administered 2019-06-12 – 2019-06-14 (×3): 40 mg via INTRAVENOUS
  Filled 2019-06-12 (×3): qty 40

## 2019-06-12 MED ORDER — EPHEDRINE SULFATE 50 MG/ML IJ SOLN
INTRAMUSCULAR | Status: DC | PRN
  Administered 2019-06-12: 10 mg via INTRAVENOUS

## 2019-06-12 MED ORDER — DEXTROSE 5 % IV SOLN
INTRAVENOUS | Status: DC | PRN
  Administered 2019-06-12 (×2): via INTRAVENOUS

## 2019-06-12 MED ORDER — ACETAMINOPHEN 10 MG/ML IV SOLN: 1000.0000 mg | Freq: Once | INTRAVENOUS | Status: DC | PRN

## 2019-06-12 MED ORDER — PROMETHAZINE HCL 25 MG/ML IJ SOLN: 6.2500 mg | INTRAMUSCULAR | Status: DC | PRN

## 2019-06-12 MED ORDER — DEXAMETHASONE SODIUM PHOSPHATE 10 MG/ML IJ SOLN
INTRAMUSCULAR | Status: DC | PRN
  Administered 2019-06-12: 10 mg via INTRAVENOUS

## 2019-06-12 MED ORDER — PANTOPRAZOLE SODIUM 40 MG IV SOLR
40.0000 mg | Freq: Two times a day (BID) | INTRAVENOUS | Status: DC
  Administered 2019-06-12: 40 mg via INTRAVENOUS
  Filled 2019-06-12: qty 40

## 2019-06-12 MED ORDER — LIDOCAINE HCL (CARDIAC) PF 100 MG/5ML IV SOSY
PREFILLED_SYRINGE | INTRAVENOUS | Status: DC | PRN
  Administered 2019-06-12: 60 mg via INTRAVENOUS
  Administered 2019-06-12: 40 mg via INTRAVENOUS

## 2019-06-12 MED ORDER — PHENOL 1.4 % MT LIQD
1.0000 | OROMUCOSAL | Status: DC | PRN
  Filled 2019-06-12: qty 177

## 2019-06-12 MED ORDER — FENTANYL CITRATE (PF) 250 MCG/5ML IJ SOLN
INTRAMUSCULAR | Status: AC
  Filled 2019-06-12: qty 5

## 2019-06-12 MED ORDER — ONDANSETRON HCL 4 MG/2ML IJ SOLN
INTRAMUSCULAR | Status: AC
  Filled 2019-06-12: qty 2

## 2019-06-12 MED ORDER — PHENYLEPHRINE HCL (PRESSORS) 10 MG/ML IV SOLN
INTRAVENOUS | Status: DC | PRN
  Administered 2019-06-12: 120 ug via INTRAVENOUS
  Administered 2019-06-12 (×3): 80 ug via INTRAVENOUS

## 2019-06-12 MED ORDER — LABETALOL HCL 5 MG/ML IV SOLN
INTRAVENOUS | Status: AC
  Administered 2019-06-12: 5 mg via INTRAVENOUS
  Filled 2019-06-12: qty 4

## 2019-06-12 MED ORDER — METOPROLOL TARTRATE 5 MG/5ML IV SOLN
2.5000 mg | Freq: Four times a day (QID) | INTRAVENOUS | Status: DC
  Administered 2019-06-12: 2.5 mg via INTRAVENOUS
  Filled 2019-06-12: qty 5

## 2019-06-12 MED ORDER — METOPROLOL TARTRATE 5 MG/5ML IV SOLN
5.0000 mg | Freq: Four times a day (QID) | INTRAVENOUS | Status: DC | PRN
  Administered 2019-06-12: 5 mg via INTRAVENOUS
  Filled 2019-06-12: qty 5

## 2019-06-12 MED ORDER — PROPOFOL 10 MG/ML IV BOLUS
INTRAVENOUS | Status: DC | PRN
  Administered 2019-06-12: 200 mg via INTRAVENOUS

## 2019-06-12 SURGICAL SUPPLY — 49 items
APPLICATOR COTTON TIP 6 STRL (MISCELLANEOUS) ×1 IMPLANT
APPLICATOR COTTON TIP 6IN STRL (MISCELLANEOUS) ×3 IMPLANT
BLADE EXTENDED COATED 6.5IN (ELECTRODE) IMPLANT
BLADE HEX COATED 2.75 (ELECTRODE) ×3 IMPLANT
COVER MAYO STAND STRL (DRAPES) ×3 IMPLANT
COVER SURGICAL LIGHT HANDLE (MISCELLANEOUS) ×3 IMPLANT
COVER WAND RF STERILE (DRAPES) IMPLANT
DERMABOND ADVANCED (GAUZE/BANDAGES/DRESSINGS) ×2
DERMABOND ADVANCED .7 DNX12 (GAUZE/BANDAGES/DRESSINGS) IMPLANT
DRAIN CHANNEL 19F RND (DRAIN) IMPLANT
DRAPE LAPAROSCOPIC ABDOMINAL (DRAPES) ×3 IMPLANT
DRAPE WARM FLUID 44X44 (DRAPES) ×3 IMPLANT
DRSG OPSITE POSTOP 4X10 (GAUZE/BANDAGES/DRESSINGS) ×2 IMPLANT
ELECT REM PT RETURN 15FT ADLT (MISCELLANEOUS) ×3 IMPLANT
EVACUATOR SILICONE 100CC (DRAIN) IMPLANT
GAUZE SPONGE 4X4 12PLY STRL (GAUZE/BANDAGES/DRESSINGS) ×3 IMPLANT
GLOVE BIOGEL M 8.0 STRL (GLOVE) ×3 IMPLANT
GOWN SPEC L4 XLG W/TWL (GOWN DISPOSABLE) ×3 IMPLANT
GOWN STRL REUS W/TWL XL LVL3 (GOWN DISPOSABLE) ×9 IMPLANT
HANDLE STAPLE EGIA 4 XL (STAPLE) ×2 IMPLANT
HANDLE SUCTION POOLE (INSTRUMENTS) IMPLANT
KIT BASIN OR (CUSTOM PROCEDURE TRAY) ×3 IMPLANT
KIT TURNOVER KIT A (KITS) IMPLANT
LIGASURE IMPACT 36 18CM CVD LR (INSTRUMENTS) ×2 IMPLANT
NS IRRIG 1000ML POUR BTL (IV SOLUTION) ×6 IMPLANT
PACK GENERAL/GYN (CUSTOM PROCEDURE TRAY) ×3 IMPLANT
PENCIL SMOKE EVACUATOR (MISCELLANEOUS) ×2 IMPLANT
RELOAD EGIA 60 MED/THCK PURPLE (STAPLE) ×9 IMPLANT
RELOAD STAPLE 60 MED/THCK ART (STAPLE) IMPLANT
SPONGE LAP 18X18 RF (DISPOSABLE) ×6 IMPLANT
STAPLER VISISTAT 35W (STAPLE) ×3 IMPLANT
SUCTION POOLE HANDLE (INSTRUMENTS) ×3
SUT PDS AB 1 CTX 36 (SUTURE) ×2 IMPLANT
SUT PDS AB 1 TP1 96 (SUTURE) ×4 IMPLANT
SUT PDS AB 4-0 SH 27 (SUTURE) ×4 IMPLANT
SUT SILK 2 0 (SUTURE) ×2
SUT SILK 2 0 SH CR/8 (SUTURE) ×3 IMPLANT
SUT SILK 2-0 18XBRD TIE 12 (SUTURE) ×1 IMPLANT
SUT SILK 3 0 (SUTURE) ×2
SUT SILK 3 0 SH CR/8 (SUTURE) ×5 IMPLANT
SUT SILK 3-0 18XBRD TIE 12 (SUTURE) ×1 IMPLANT
SUT VIC AB 2-0 SH 27 (SUTURE) ×2
SUT VIC AB 2-0 SH 27X BRD (SUTURE) IMPLANT
SUT VIC AB 3-0 SH 18 (SUTURE) IMPLANT
SUT VICRYL 2 0 18  UND BR (SUTURE)
SUT VICRYL 2 0 18 UND BR (SUTURE) IMPLANT
TOWEL OR 17X26 10 PK STRL BLUE (TOWEL DISPOSABLE) ×3 IMPLANT
TOWEL OR NON WOVEN STRL DISP B (DISPOSABLE) ×3 IMPLANT
TRAY FOLEY MTR SLVR 16FR STAT (SET/KITS/TRAYS/PACK) ×3 IMPLANT

## 2019-06-12 NOTE — Progress Notes (Signed)
PROGRESS NOTE  Dwayne Sawyer [id number] DOB: 1971-06-28   PCP: [facility]  Patient is from: Home  DOA: 06/11/2019 LOS: 1  Brief Narrative / Interim history: 48 year old male with history of RA, HTN, HLD, inguinal hernia and noncompliance with medication reportedly due to lack of his insurance presenting with 6 weeks of gradually worsening abdominal pain, nausea and diarrhea, and admitted 06/11/19 with high-grade SBO as noted on CT abdomen and pelvis, hypertensive urgency and AKI.  General surgery consulted.  NG tube placed.  Subjective: No major events overnight of this morning.  About 600 cc output from NG tube.  Continues to endorse abdominal pain.  Very angry and uncomfortable with NGT.  He says he would rather have surgery done..  Reports small watery bowel movements last night.  Denies chest pain, dyspnea, UTI or focal neuro symptoms.  Objective: Vitals:   06/12/19 0242 06/12/19 0534 06/12/19 0729 06/12/19 0949  BP: (!) 168/102 (!) 146/112 (!) 180/116 (!) 178/119  Pulse: 79 83 82 90  Resp: 15 18    Temp: 98.4 F (36.9 C) 97.7 F (36.5 C)    TempSrc: Oral Oral    SpO2: 95% 96%    Weight:      Height:        Intake/Output Summary (Last 24 hours) at 06/12/2019 1110 Last data filed at 06/12/2019 1019 Gross per 24 hour  Intake 1580.34 ml  Output 1225 ml  Net 355.34 ml   Filed Weights   06/11/19 0927  Weight: 105.8 kg    Examination:  GENERAL: No acute distress.  Appears well.  HEENT: MMM.  NG tube with greenish output NECK: Supple.  No apparent JVD.  RESP:  No IWOB. Good air movement bilaterally. CVS:  RRR. Heart sounds normal.  ABD/GI/GU: Bowel sounds present.  Distended but nontender. MSK/EXT:  No apparent deformity or edema. Moves extremities. SKIN: no apparent skin lesion or wound NEURO: Awake, alert and oriented appropriately.  No gross deficit.  PSYCH: Calm. Normal affect.   Imaging RUQ ultrasound 06/11/2019 Right lobe  liver mass measuring 4.4 x 4.9 x 4.0 cm, multiple loops of fluid-filled bowels but not able to visualize common bile duct obscured by gas.  Previously reported as  stable 3.2 x 2.5 cm indeterminate lesion on OSH MRI in 2015 thought to be benign due to lack of interval growth from MRI in 2014.  CT abdomen and pelvis 06/11/2019 Revealed high-grade SBO secondary to desmoplastic response in the central right abdominal mesentery centered on a 4 cm calcified irregular/spiculated soft tissue lesion with abrupt transition zone, small lymph nodes in the abnormal right mesentery suggesting additional metastatic involvement and heterogeneous liver parenchyma likely due to geographic fatty deposition and a focal 14 mm low-density lesion in the right liver, tiny sclerotic foci in T12 vertebral body and both femoral heads.  MRI abdomen on 07/18/2016 at [facility] Stable 3.2 x 2.5 cm indeterminate lesion on OSH MRI in 2015 thought to be benign due to lack of interval growth from MRI in 2014  Assessment & Plan: High-grade SBO/nausea/abdominal pain: noted on CT abdomen and pelvis as above.  Reportedly had watery diarrhea up until the morning of admission.  Repeat KUB this morning with stable SBO. -General surgery following. -NG tube placed 06/11/2019-about 650 cc output so far.  Very angry and uncomfortable with NG tube. -Continue pain control, IV fluid and antiemetics -Monitor electrolytes and replenish as appropriate  Mesenteric mass/mesenteric LAD/liver mass:  CT A/P and RUQ ultrasound as  above.  Reportedly had normal colonoscopy 5 years ago in [facility]. No personal or family history other than carcinoid tumor in his father in 62s.  CEA within normal range.  CA 19-9 on upper side of normal at 38. -Outpatient follow-up with GI-unless he undergoes surgery for SBO in which case it would be reasonable to obtain biopsy for pathology  Hypertensive urgency/tachycardia: BP 222/143 on presentation.  Has not taken  meds for 8 months.  Improved.  -We will add IV metoprolol 2.5 mg every 6 hours -Continue as needed labetalol  AKI: Cr 1.19 (in 02/2016)> 1.62 (admit)> 1.27.  Likely prerenal from GI loss -IV fluid as above -Recheck in the morning  Polycythemia: Likely from hemoconcentration.   Resolving -Monitor as needed  Hypokalemia: Resolved. -Continue NS-KCl.  Hyponatremia: Likely hypovolemic from GI loss -Continue NS-KCl.  Ketonuria: Likely due to dehydration -IV fluid as above  Sclerotic T12 and a lateral femoral head lesions -Malignancy work-up as above  History of rheumatoid arthritis: Off medication for about a year.  No flareup. -Outpatient follow-up  Hyperbilirubinemia: LFT and ALP within normal.  RUQ ultrasound without significant finding but poorly visualized bile duct. -We will continue monitoring               DVT prophylaxis: Subcu heparin Code Status: Full code Family Communication: Patient and/or RN. Available if any question.  Disposition Plan: Remains inpatient for SBO Consultants: General surgery  Procedures:  11/18-NG tube placed.  Microbiology summarized: 11/18-COVID-19 screen negative.  Sch Meds:  Scheduled Meds:  heparin  5,000 Units Subcutaneous Q8H   metoprolol tartrate  2.5 mg Intravenous Q6H   pantoprazole (PROTONIX) IV  40 mg Intravenous Q12H   Continuous Infusions:  dextrose 5 % and 0.45 % NaCl with KCl 20 mEq/L 100 mL/hr at 06/11/19 1817   PRN Meds:.albuterol, labetalol, morphine injection, ondansetron **OR** ondansetron (ZOFRAN) IV, phenol  Antimicrobials: Anti-infectives (From admission, onward)   None       I have personally reviewed the following labs and images: CBC: Recent Labs  Lab 06/11/19 0947 06/11/19 1814 06/12/19 0407  WBC 7.8 8.0 7.3  NEUTROABS 5.9  --   --   HGB 17.0 16.4 15.1  HCT 52.2* 50.7 47.1  MCV 83.8 84.1 85.2  PLT 396 402* 349   BMP &GFR Recent Labs  Lab 06/11/19 0947 06/11/19 1814  06/12/19 0407  NA 137  --  133*  K 3.0*  --  3.7  CL 97*  --  99  CO2 27  --  24  GLUCOSE 109*  --  111*  BUN 16  --  15  CREATININE 1.62* 1.45* 1.27*  CALCIUM 9.0  --  8.3*   Estimated Creatinine Clearance: 83.4 mL/min (A) (by C-G formula based on SCr of 1.27 mg/dL (H)). Liver & Pancreas: Recent Labs  Lab 06/11/19 0947 06/12/19 0407  AST 19 14*  ALT 25 21  ALKPHOS 123 100  BILITOT 1.1 1.3*  PROT 7.9 6.3*  ALBUMIN 4.2 3.4*   Recent Labs  Lab 06/11/19 0947  LIPASE 31   No results for input(s): AMMONIA in the last 168 hours. Diabetic: Recent Labs    06/12/19 0407  HGBA1C 5.8*   No results for input(s): GLUCAP in the last 168 hours. Cardiac Enzymes: No results for input(s): CKTOTAL, CKMB, CKMBINDEX, TROPONINI in the last 168 hours. No results for input(s): PROBNP in the last 8760 hours. Coagulation Profile: No results for input(s): INR, PROTIME in the last 168 hours. Thyroid Function  Tests: No results for input(s): TSH, T4TOTAL, FREET4, T3FREE, THYROIDAB in the last 72 hours. Lipid Profile: No results for input(s): CHOL, HDL, LDLCALC, TRIG, CHOLHDL, LDLDIRECT in the last 72 hours. Anemia Panel: No results for input(s): VITAMINB12, FOLATE, FERRITIN, TIBC, IRON, RETICCTPCT in the last 72 hours. Urine analysis:    Component Value Date/Time   COLORURINE YELLOW 06/11/2019 [place] 06/11/2019 1314   LABSPEC 1.025 06/11/2019 1314   PHURINE 6.0 06/11/2019 1314   GLUCOSEU NEGATIVE 06/11/2019 1314   HGBUR NEGATIVE 06/11/2019 1314   BILIRUBINUR SMALL (A) 06/11/2019 1314   KETONESUR 40 (A) 06/11/2019 1314   PROTEINUR 30 (A) 06/11/2019 1314   NITRITE NEGATIVE 06/11/2019 1314   LEUKOCYTESUR NEGATIVE 06/11/2019 1314   Sepsis Labs: Invalid input(s): PROCALCITONIN, [place]  Microbiology: Recent Results (from the past 240 hour(s))  SARS CORONAVIRUS 2 (TAT 6-24 HRS) Nasopharyngeal Nasopharyngeal Swab     Status: None   Collection Time: 06/11/19   2:18 PM   Specimen: Nasopharyngeal Swab  Result Value Ref Range Status   SARS Coronavirus 2 NEGATIVE NEGATIVE Final    Comment: (NOTE) SARS-CoV-2 target nucleic acids are NOT DETECTED. The SARS-CoV-2 RNA is generally detectable in upper and lower respiratory specimens during the acute phase of infection. Negative results do not preclude SARS-CoV-2 infection, do not rule out co-infections with other pathogens, and should not be used as the sole basis for treatment or other patient management decisions. Negative results must be combined with clinical observations, patient history, and epidemiological information. The expected result is Negative. Fact Sheet for Patients: SugarRoll.be Fact Sheet for Healthcare Providers: https://www.woods-mathews.com/ This test is not yet approved or cleared by the [place] FDA and  has been authorized for detection and/or diagnosis of SARS-CoV-2 by FDA under an Emergency Use Authorization (EUA). This EUA will remain  in effect (meaning this test can be used) for the duration of the COVID-19 declaration under Section 56 4(b)(1) of the Act, 21 U.S.C. section 360bbb-3(b)(1), unless the authorization is terminated or revoked sooner. Performed at [facility], [place] [address], [place], [place] [address]     Radiology Studies: Dg Abd 1 View  Result Date: 06/12/2019 CLINICAL DATA:  Abdominal pain. EXAM: ABDOMEN - 1 VIEW COMPARISON:  June 11, 2019. FINDINGS: Stable dilated small bowel loops are noted concerning for distal small bowel obstruction. Residual contrast and stool is noted in the rectum. Nasogastric tube tip is seen in proximal stomach. IMPRESSION: Stable dilated small bowel loops concerning for distal small bowel obstruction. Electronically Signed   By: Marijo Conception M.D.   On: 06/12/2019 10:17   Dg Abd 1 View  Result Date: 06/11/2019 CLINICAL DATA:  NG tube placement EXAM: ABDOMEN - 1  VIEW COMPARISON:  CT earlier today FINDINGS: NG tube is in the stomach. Dilated small bowel loops compatible with small bowel obstruction. IMPRESSION: NG tube tip in the stomach. Continued small bowel obstruction pattern. Electronically Signed   By: Rolm Baptise M.D.   On: 06/11/2019 23:59   Ct Abdomen Pelvis W Contrast  Result Date: 06/11/2019 CLINICAL DATA:  Right upper quadrant pain and vomiting. EXAM: CT ABDOMEN AND PELVIS WITH CONTRAST TECHNIQUE: Multidetector CT imaging of the abdomen and pelvis was performed using the standard protocol following bolus administration of intravenous contrast. CONTRAST:  159mL OMNIPAQUE IOHEXOL 300 MG/ML  SOLN COMPARISON:  None. FINDINGS: Lower chest: Unremarkable. Hepatobiliary: Geographic low attenuation within the liver parenchyma is probably fatty deposition. Focal 14 mm low-density lesion identified in  the right liver on 16/2. There is no evidence for gallstones, gallbladder wall thickening, or pericholecystic fluid. No intrahepatic or extrahepatic biliary dilation. Pancreas: No focal mass lesion. No dilatation of the main duct. No intraparenchymal cyst. No peripancreatic edema. Spleen: No splenomegaly. No focal mass lesion. Adrenals/Urinary Tract: No adrenal nodule or mass. 4 mm hypodensity in the interpolar right kidney is too small to characterize 10 mm low-density cortical lesion noted upper pole left kidney with another 6 mm hypodensity in the interpolar region of the left kidney. No evidence for hydroureter. The urinary bladder appears normal for the degree of distention. Stomach/Bowel: Stomach is unremarkable. No gastric wall thickening. No evidence of outlet obstruction. Duodenum is normally positioned as is the ligament of Treitz. There is marked small-bowel obstruction with mid to distal small bowel measuring up to 5.8 cm diameter. These dilated bowel loops are fluid-filled. In the right abdomen, the abnormal bowel gradually tapers and begins demonstrating  circumferential wall thickening. The abnormal small bowel demonstrates an abrupt transition (visible on axial 53/2 and coronal 41/5) immediately adjacent to central mesenteric desmoplastic response. This desmoplasia is centered on a 3.8 x 2.3 x 2.8 cm mesenteric soft tissue lesion with several associated punctate calcifications. Small bowel in this region is tethered to the mesenteric nodule in there are numerous adjacent small mesenteric lymph nodes. The distal and terminal ileum is decompressed. Colon is diffusely decompressed. Vascular/Lymphatic: There is abdominal aortic atherosclerosis without aneurysm. No gastrohepatic or hepato duodenal ligament lymphadenopathy. As above, numerous small nodes are seen in the mesentery of the right abdomen. No pelvic sidewall lymphadenopathy. Reproductive: Prostate gland is mildly enlarged. Other: Small volume interloop mesenteric fluid noted without substantial free fluid in the pelvis. Musculoskeletal: 6 mm sclerotic focus noted in the T12 vertebral body. Tiny sclerotic foci seen in each femoral head. Small bilateral groin hernias contain only fat. IMPRESSION: 1. High-grade small bowel obstruction secondary to desmoplastic response in the central right abdominal mesentery centered on a 4 cm calcified irregular/spiculated soft tissue lesion. Abrupt small bowel transition zone is identified immediately lead adjacent to this mesenteric lesion and multiple adjacent bowel loops are tethered into this region with mesenteric edema/congestion. The loop of small bowel immediately proximal to the transition zone shows mild circumferential wall thickening but no pneumatosis. 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 2. Small lymph nodes in the abnormal right mesentery suggest additional metastatic involvement. 3. Heterogeneous liver parenchyma, likely secondary to geographic fatty deposition. There is a focal 14 mm  low-density lesion in the right liver. The patient had an MRI in the [facility] system on 08/09/2013 to evaluate a right liver lesion, but those images are not available. Follow-up MRI without and with contrast recommended to exclude metastatic disease. 4. Tiny sclerotic foci in the T12 vertebral body in both femoral heads are likely benign, but close attention on follow-up recommended. 5.  Aortic Atherosclerois (ICD10-170.0) Electronically Signed   By: Misty Stanley M.D.   On: 06/11/2019 13:34     Nioka Thorington T. [place]  If 7PM-7AM, please contact night-coverage [email or web address] Password [email or web address] 06/12/2019, 11:10 AM

## 2019-06-12 NOTE — Progress Notes (Signed)
NGT has been placed.

## 2019-06-12 NOTE — Anesthesia Preprocedure Evaluation (Signed)
Anesthesia Evaluation  Patient identified by MRN, date of birth, ID band Patient awake    Reviewed: Allergy & Precautions, NPO status , Patient's Chart, lab work & pertinent test results  History of Anesthesia Complications Negative for: history of anesthetic complications  Airway Mallampati: II  TM Distance: >3 FB Neck ROM: Full    Dental  (+) Chipped,    Pulmonary neg pulmonary ROS,    Pulmonary exam normal        Cardiovascular hypertension, Pt. on medications Normal cardiovascular exam     Neuro/Psych negative neurological ROS  negative psych ROS   GI/Hepatic Neg liver ROS, Small bowel obstruction   Endo/Other  negative endocrine ROS  Renal/GU ARFRenal disease (Cr 1.27)  negative genitourinary   Musculoskeletal  (+) Arthritis , Rheumatoid disorders,    Abdominal   Peds  Hematology negative hematology ROS (+)   Anesthesia Other Findings Day of surgery medications reviewed with patient.  Reproductive/Obstetrics negative OB ROS                             Anesthesia Physical Anesthesia Plan  ASA: III  Anesthesia Plan: General   Post-op Pain Management:    Induction: Intravenous, Rapid sequence and Cricoid pressure planned  PONV Risk Score and Plan: 4 or greater and Treatment may vary due to age or medical condition, Ondansetron, Midazolam and Dexamethasone  Airway Management Planned: Oral ETT and Video Laryngoscope Planned  Additional Equipment: None  Intra-op Plan:   Post-operative Plan: Extubation in OR  Informed Consent: I have reviewed the patients History and Physical, chart, labs and discussed the procedure including the risks, benefits and alternatives for the proposed anesthesia with the patient or authorized representative who has indicated his/her understanding and acceptance.     Dental advisory given  Plan Discussed with: CRNA  Anesthesia Plan Comments:          Anesthesia Quick Evaluation

## 2019-06-12 NOTE — Progress Notes (Signed)
CC: Abdominal pain  Subjective: Dwayne Sawyer is angry and distressed over the NG tube.  Dwayne Sawyer does not feel any better, and the NG tube is extremely uncomfortable.  The canister is full of dark brown saliva looks coffee-ground in color. Dwayne Sawyer remains very distended but his abdomen is soft.  No flatus.  His last BM was yesterday but has been liquidy.  Dwayne Sawyer has not been able to eat a regular meal in some time.  The NG is in; its draining; it is in the stomach but I do not think it is working properly.  I cannot flush the sump port.  When I irrigated with water Dwayne Sawyer can taste in the back of his throat.  Objective: Vital signs in last 24 hours: Temp:  [97.7 F (36.5 C)-99.2 F (37.3 C)] 97.7 F (36.5 C) (11/19 0534) Pulse Rate:  [78-114] 82 (11/19 0729) Resp:  [14-20] 18 (11/19 0534) BP: (146-222)/(102-145) 180/116 (11/19 0729) SpO2:  [94 %-100 %] 96 % (11/19 0534) Weight:  [105.8 kg] 105.8 kg (11/18 0927) Last BM Date: 06/12/19 50 p.o. 1500 IV No urine recorded 575 NG Afebrile, hypertensive, last blood pressure 180/116 Dwayne Sawyer is not tachycardic. Sodium 133 glucose 111 Creatinine improving 1.27 WBC 7.3 H/H 15/47 Platelets 349K CT scan 11/18: High-grade small bowel obstruction secondary to 3.8 x 2.3 x 2.8 cm mesenteric soft tissue desmoplastic lesion.  Small bowel measured up to 5.8 cm.  Numerous adjacent small mesenteric lymph nodes  Intake/Output from previous day: 11/18 0701 - 11/19 0700 In: 1580.3 [P.O.:50; I.V.:1107.3; IV Piggyback:423] Out: 575 [Emesis/NG output:575] Intake/Output this shift: No intake/output data recorded.  General appearance: alert, cooperative and mild distress Resp: clear to auscultation bilaterally GI: Dwayne Sawyer is fairly distended.  Bowel sounds are hypoactive.  NG is in place with dark-colored drainage coming from it.  No flatus.  Dwayne Sawyer does not have peritonitis.  Last BM was yesterday.  Lab Results:  Recent Labs    06/11/19 1814 06/12/19 0407  WBC 8.0 7.3  HGB 16.4  15.1  HCT 50.7 47.1  PLT 402* 349    BMET Recent Labs    06/11/19 0947 06/11/19 1814 06/12/19 0407  NA 137  --  133*  K 3.0*  --  3.7  CL 97*  --  99  CO2 27  --  24  GLUCOSE 109*  --  111*  BUN 16  --  15  CREATININE 1.62* 1.45* 1.27*  CALCIUM 9.0  --  8.3*   PT/INR No results for input(s): LABPROT, INR in the last 72 hours.  Recent Labs  Lab 06/11/19 0947 06/12/19 0407  AST 19 14*  ALT 25 21  ALKPHOS 123 100  BILITOT 1.1 1.3*  PROT 7.9 6.3*  ALBUMIN 4.2 3.4*     Lipase     Component Value Date/Time   LIPASE 31 06/11/2019 0947     Medications: . heparin  5,000 Units Subcutaneous Q8H  . metoprolol tartrate  2.5 mg Intravenous Q6H   . dextrose 5 % and 0.45 % NaCl with KCl 20 mEq/L 100 mL/hr at 06/11/19 1817   Assessment/Plan Hx rheumatoid arthritis Hypertension Hypercholesterolemia Dehydration AKI -1.62>> 1.45>> 1.27(11/19)   Small bowel obstruction secondary to a neoplasm with adhesions  -NG placed  -CA 19-9   38  - CEA 2.0  FEN: IV fluids/n.p.o. ID: None DVT: Heparin Follow-up: To be determined  Plan: Continue NG.  I have ordered a plain film.  Continue to rehydrate.  I will update his labs  in the morning, type and screen.  Dr. Hassell Done will discuss options later today, including surgery tomorrow.  Change IV fluids to D5 NS +40 KCl, I do not know how much of the oral potassium supplement Dwayne Sawyer is going to absorb.  Add PPI.      LOS: 1 day    Dwayne Sawyer 06/12/2019 Please see Amion

## 2019-06-12 NOTE — Op Note (Signed)
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  After anesthesia was administered and the patient was prepped  with chloroprep and a timeout was performed.  A midline laparotomy was used to enter the abdomen.  The small bowel was obstructed by a mass involving the small intestine and mesentery.  There were dilated vessels indicating chronic obstruction.  Proximal and distal the bowel was transected with the Covidien 6 cm purple load Tristar.  The mesentery was divided with the LigaSure.  The specimen was sent for frozen section which showed carcinoid.  The mesentery was closed with running 2-0 vicryl.  The anastomosis was created as a side to side purple Covidien tristar and the common defect was closed with two layers of PDS and 3-0 silk.  No spillage.    Fascia was closed with running #1 PDS;  Skin closed with stapler.     The patient tolerated the procedure well and was taken to the PACU in stable condition.     Matt B. Hassell Done, [place], [facility], [place]

## 2019-06-12 NOTE — Anesthesia Procedure Notes (Signed)
Procedure Name: Intubation Date/Time: 06/12/2019 2:25 PM Performed by: Glory Buff, CRNA Pre-anesthesia Checklist: Patient identified, Emergency Drugs available, Suction available and Patient being monitored Patient Re-evaluated:Patient Re-evaluated prior to induction Oxygen Delivery Method: Circle system utilized Preoxygenation: Pre-oxygenation with 100% oxygen Induction Type: IV induction Ventilation: Mask ventilation without difficulty Laryngoscope Size: Glidescope and 4 Grade View: Grade I Tube type: Oral Tube size: 7.5 mm Number of attempts: 1 Airway Equipment and Method: Stylet and Oral airway Placement Confirmation: ETT inserted through vocal cords under direct vision,  positive ETCO2 and breath sounds checked- equal and bilateral Secured at: 21 cm Tube secured with: Tape Dental Injury: Teeth and Oropharynx as per pre-operative assessment

## 2019-06-12 NOTE — Interval H&P Note (Signed)
History and Physical Interval Note:  06/12/2019 2:09 PM  Dwayne Sawyer  has presented today for surgery, with the diagnosis of small bowel obstruction.  The various methods of treatment have been discussed with the patient and family. After consideration of risks, benefits and other options for treatment, the patient has consented to  Procedure(s): EXPLORATORY LAPAROTOMY (N/A) as a surgical intervention.  The patient's history has been reviewed, patient examined, no change in status, stable for surgery.  I have reviewed the patient's chart and labs.  Questions were answered to the patient's satisfaction.     [name]

## 2019-06-12 NOTE — Anesthesia Postprocedure Evaluation (Signed)
Anesthesia Post Note  Patient: Dwayne Sawyer  Procedure(s) Performed: EXPLORATORY LAPAROTOMY WITH SMALL BOWEL RESECTION (N/A Abdomen)     Patient location during evaluation: PACU Anesthesia Type: General Level of consciousness: awake and alert Pain management: pain level controlled Vital Signs Assessment: post-procedure vital signs reviewed and stable Respiratory status: spontaneous breathing, nonlabored ventilation, respiratory function stable and patient connected to nasal cannula oxygen Cardiovascular status: blood pressure returned to baseline and stable Postop Assessment: no apparent nausea or vomiting Anesthetic complications: no    Last Vitals:  Vitals:   06/12/19 1749 06/12/19 1841  BP: (!) 142/111 (!) 167/111  Pulse: 76 84  Resp: 12 16  Temp: 36.4 C 36.8 C  SpO2: 96% 96%               Effie Berkshire

## 2019-06-12 NOTE — Transfer of Care (Signed)
Immediate Anesthesia Transfer of Care Note  Patient: Dwayne Sawyer  Procedure(s) Performed: EXPLORATORY LAPAROTOMY WITH SMALL BOWEL RESECTION (N/A Abdomen)  Patient Location: PACU  Anesthesia Type:General  Level of Consciousness: drowsy, patient cooperative and responds to stimulation  Airway & Oxygen Therapy: Patient Spontanous Breathing and Patient connected to face mask oxygen  Post-op Assessment: Report given to RN and Post -op Vital signs reviewed and stable  Post vital signs: Reviewed and stable  Last Vitals:  Vitals Value Taken Time  BP 187/119 06/12/19 1621  Temp    Pulse 83 06/12/19 1624  Resp 22 06/12/19 1624  SpO2 91 % 06/12/19 1624  Vitals shown include unvalidated device data.  Last Pain:  Vitals:   06/12/19 1347  TempSrc: Oral  PainSc: 3       Patients Stated Pain Goal: 2 ([id number])  Complications: No apparent anesthesia complications

## 2019-06-13 ENCOUNTER — Encounter (HOSPITAL_COMMUNITY): Payer: Self-pay | Admitting: Surgery

## 2019-06-13 DIAGNOSIS — D3A019 Benign carcinoid tumor of the small intestine, unspecified portion: Secondary | ICD-10-CM

## 2019-06-13 DIAGNOSIS — D3A011 Benign carcinoid tumor of the jejunum: Secondary | ICD-10-CM

## 2019-06-13 DIAGNOSIS — D3A098 Benign carcinoid tumors of other sites: Secondary | ICD-10-CM

## 2019-06-13 LAB — CBC
HCT: 46.6 % (ref 39.0–52.0)
Hemoglobin: 14.6 g/dL (ref 13.0–17.0)
MCH: 27.4 pg (ref 26.0–34.0)
MCHC: 31.3 g/dL (ref 30.0–36.0)
MCV: 87.6 fL (ref 80.0–100.0)
Platelets: 342 10*3/uL (ref 150–400)
RBC: 5.32 MIL/uL (ref 4.22–5.81)
RDW: 13.6 % (ref 11.5–15.5)
WBC: 11.5 10*3/uL — ABNORMAL HIGH (ref 4.0–10.5)
nRBC: 0 % (ref 0.0–0.2)

## 2019-06-13 LAB — BASIC METABOLIC PANEL
Anion gap: 10 (ref 5–15)
BUN: 12 mg/dL (ref 6–20)
CO2: 24 mmol/L (ref 22–32)
Calcium: 8.3 mg/dL — ABNORMAL LOW (ref 8.9–10.3)
Chloride: 103 mmol/L (ref 98–111)
Creatinine, Ser: 1.23 mg/dL (ref 0.61–1.24)
GFR calc Af Amer: >60 mL/min (ref >60)
GFR calc non Af Amer: >60 mL/min (ref >60)
Glucose, Bld: 145 mg/dL — ABNORMAL HIGH (ref 70–99)
Potassium: 3.9 mmol/L (ref 3.5–5.1)
Sodium: 137 mmol/L (ref 135–145)

## 2019-06-13 LAB — MAGNESIUM: Magnesium: 2.1 mg/dL (ref 1.7–2.4)

## 2019-06-13 MED ORDER — LABETALOL HCL 5 MG/ML IV SOLN
10.0000 mg | INTRAVENOUS | Status: DC | PRN
  Administered 2019-06-13 – 2019-06-15 (×3): 10 mg via INTRAVENOUS
  Filled 2019-06-13 (×4): qty 4

## 2019-06-13 MED ORDER — AMLODIPINE BESYLATE 10 MG PO TABS
10.0000 mg | ORAL_TABLET | Freq: Every day | ORAL | Status: DC
  Administered 2019-06-13 – 2019-06-15 (×3): 10 mg via ORAL
  Filled 2019-06-13 (×3): qty 1

## 2019-06-13 NOTE — Progress Notes (Addendum)
1 Day Post-Op    CC: Small bowel obstruction  Subjective: Patient is feeling much better this a.m.  His NG is not working.  I tried flushing both ports and neither one worked.  I removed the tube and found there is a kink in it.  He is not passing any flatus that he is aware of so far.  Midline incision looks fine.  Objective: Vital signs in last 24 hours: Temp:  [97.6 F (36.4 C)-99.1 F (37.3 C)] 99.1 F (37.3 C) (11/20 0635) Pulse Rate:  [75-90] 80 (11/20 0635) Resp:  [12-22] 20 (11/20 0635) BP: (142-187)/(95-127) 173/107 (11/20 0635) SpO2:  [93 %-99 %] 99 % (11/20 0635) Last BM Date: 06/12/19 N.p.o. 5000 IV Urine 1370 NG 775 Afebrile blood pressure still markedly elevated Electrolytes are normal.  WBC 11.1 H/H 14.6/46.6 Intake/Output from previous day: 11/19 0701 - 11/20 0700 In: 5082.3 [I.V.:4982.3; IV Piggyback:100] Out: [id number] [Urine:1370; Emesis/NG output:775; Blood:25] Intake/Output this shift: No intake/output data recorded.  General appearance: alert, cooperative and no distress Resp: clear to auscultation bilaterally GI: Soft feels much better he is not taking much at all for pain.  He does have bowel sounds no flatus so far.  Lab Results:  Recent Labs    06/12/19 0407 06/13/19 0308  WBC 7.3 11.5*  HGB 15.1 14.6  HCT 47.1 46.6  PLT 349 342    BMET Recent Labs    06/12/19 0407 06/13/19 0308  NA 133* 137  K 3.7 3.9  CL 99 103  CO2 24 24  GLUCOSE 111* 145*  BUN 15 12  CREATININE 1.27* 1.23  CALCIUM 8.3* 8.3*   PT/INR No results for input(s): LABPROT, INR in the last 72 hours.  Recent Labs  Lab 06/11/19 0947 06/12/19 0407  AST 19 14*  ALT 25 21  ALKPHOS 123 100  BILITOT 1.1 1.3*  PROT 7.9 6.3*  ALBUMIN 4.2 3.4*     Lipase     Component Value Date/Time   LIPASE 31 06/11/2019 0947     Medications: . heparin  5,000 Units Subcutaneous Q8H  . pantoprazole (PROTONIX) IV  40 mg Intravenous QHS   . 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 Johnathan Hausen  FEN: IV fluids/n.p.o. ID: Cefotetan pre op DVT: Heparin Follow-up: Dr. Hassell Done  Plan: I remove the NG tube because it was not working.  We will keep him on ice chips and sips for now.  Mobilize.  Advance diet as tolerated and bowel function returns.  Defer BP to Dr. Cyndia Skeeters,  I am going to give him some ice chips and sips, you can start an oral BP med and we can see how he does.    LOS: 2 days    Tyrae Alcoser 06/13/2019 Please see Amion

## 2019-06-13 NOTE — Progress Notes (Signed)
A resistance was felt while trying to flush patients NG tube, Patient was encouraged to ambulate in the hallway, instructed to sit at the bedside after walking to flush NG tube again but was unsuccessful resistant still felt and was un bale to flush. We will continue to monitor.

## 2019-06-13 NOTE — Progress Notes (Signed)
PROGRESS NOTE  Dwayne Sawyer [id number] DOB: 04-Apr-1971   PCP: [facility]  Patient is from: Home  DOA: 06/11/2019 LOS: 2  Brief Narrative / Interim history: 48 year old male with history of RA, HTN, HLD, inguinal hernia and noncompliance with medication reportedly due to lack of his insurance presenting with 6 weeks of gradually worsening abdominal pain, nausea and diarrhea, and admitted 06/11/19 with high-grade SBO as noted on CT abdomen and pelvis, hypertensive urgency and AKI.  General surgery consulted.  NG tube placed 11/18.  Patient underwent laparotomy with small bowel resection on 06/12/2019.  Biopsy obtained and revealed carcinoid.   Subjective: No major events overnight of this morning.  Reports improvement in his pain.  Denies chest pain, dyspnea and nausea.  Has not passed gas yet.  Objective: Vitals:   06/12/19 2112 06/13/19 0132 06/13/19 0635 06/13/19 0938  BP: (!) 179/105 (!) 169/115 (!) 173/107 (!) 171/104  Pulse: 75 84 80 85  Resp: 17 (!) 22 20 16   Temp: 98 F (36.7 C) 98.4 F (36.9 C) 99.1 F (37.3 C) 98.2 F (36.8 C)  TempSrc: Oral Oral Oral Oral  SpO2: 97% 99% 99% 98%  Weight:      Height:        Intake/Output Summary (Last 24 hours) at 06/13/2019 1217 Last data filed at 06/13/2019 1041 Gross per 24 hour  Intake 5312.83 ml  Output 3145 ml  Net 2167.83 ml   Filed Weights   06/11/19 0927  Weight: 105.8 kg    Examination:  GENERAL: No acute distress.  Appears well.  HEENT: MMM.  Vision and hearing grossly intact.  NECK: Supple.  No apparent JVD.  RESP:  No IWOB. Good air movement bilaterally. CVS:  RRR. Heart sounds normal.  ABD/GI/GU: Bowel sounds present. Soft. Non tender.  Honeycomb dressing over abdomen DCI. MSK/EXT:  Moves extremities. No apparent deformity or edema.  SKIN: Honeycomb dressing over laparotomy wound DCI. NEURO: Awake, alert and oriented appropriately.  No apparent focal neuro deficit. PSYCH:  Calm. Normal affect.  Imaging RUQ ultrasound 06/11/2019 Right lobe liver mass measuring 4.4 x 4.9 x 4.0 cm, multiple loops of fluid-filled bowels but not able to visualize common bile duct obscured by gas.  Previously reported as  stable 3.2 x 2.5 cm indeterminate lesion on OSH MRI in 2015 thought to be benign due to lack of interval growth from MRI in 2014.  CT abdomen and pelvis 06/11/2019 Revealed high-grade SBO secondary to desmoplastic response in the central right abdominal mesentery centered on a 4 cm calcified irregular/spiculated soft tissue lesion with abrupt transition zone, small lymph nodes in the abnormal right mesentery suggesting additional metastatic involvement and heterogeneous liver parenchyma likely due to geographic fatty deposition and a focal 14 mm low-density lesion in the right liver, tiny sclerotic foci in T12 vertebral body and both femoral heads.  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 Johnathan Hausen -Pathology showed carcinoid -NG tube in place this morning but removed by surgery later -Continue pain control, IV fluid and antiemetics -Surgery started ice chips and sips  Mesenteric carcinoid/mesenteric LAD/liver mass:  CT A/P and RUQ ultrasound as above.  -CEA within normal range.  CA 19-9  on upper side of normal at 38. -Surgical pathology confirmed this. -Check serum serotonin and urinary 5-HIAA  Hypertensive urgency/tachycardia: BP 222/143 on presentation.  Has not taken meds for 8 months.  Improved.  -Start amlodipine 10 mg daily -As needed labetalol -Decrease IV fluid -Serum serotonin and urinary 5 HIAA as above.  AKI: Cr 1.19 (in 02/2016)> 1.62  (admit)> 1.23.  Likely prerenal from GI loss -IV fluid as above -Recheck in the morning  Polycythemia: Likely from hemoconcentration.   Resolving -Monitor as needed  Hypokalemia: Resolved. -Continue NS-KCl.  Hyponatremia: Likely hypovolemic from GI loss.  Resolved. -Continue NS-KCl.  Ketonuria: Likely due to dehydration -IV fluid as above  Sclerotic T12 and a lateral femoral head lesions -Malignancy work-up as above  History of rheumatoid arthritis: Off medication for about a year.  No flareup. -Outpatient follow-up  Hyperbilirubinemia: LFT and ALP within normal.  RUQ ultrasound without significant finding but poorly visualized bile duct. -We will continue monitoring  Leukocytosis/bandemia: Likely demargination.               DVT prophylaxis: Subcu heparin Code Status: Full code Family Communication: Patient and/or RN. Available if any question.  Disposition Plan: Remains inpatient for SBO Consultants: General surgery  Procedures:  11/18-NG tube placed.  Microbiology summarized: 11/18-COVID-19 screen negative.  Sch Meds:  Scheduled Meds: . heparin  5,000 Units Subcutaneous Q8H  . pantoprazole (PROTONIX) IV  40 mg Intravenous QHS   Continuous Infusions: . dextrose 5 % and 0.45 % NaCl with KCl 20 mEq/L 125 mL/hr at 06/13/19 1038   PRN Meds:.labetalol, morphine injection, ondansetron **OR** ondansetron (ZOFRAN) IV  Antimicrobials: Anti-infectives (From admission, onward)   Start     Dose/Rate Route Frequency Ordered Stop   06/13/19 0600  cefoTEtan (CEFOTAN) 2 g in sodium chloride 0.9 % 100 mL IVPB     2 g 200 mL/hr over 30 Minutes Intravenous On call to O.R. 06/12/19 1204 06/12/19 1458   06/13/19 0230  cefoTEtan (CEFOTAN) 2 g in sodium chloride 0.9 % 100 mL IVPB     2 g 200 mL/hr over 30 Minutes Intravenous Every 12 hours 06/12/19 1752 06/13/19 0237       I have personally reviewed the following labs and images: CBC: Recent Labs  Lab 06/11/19  0947 06/11/19 1814 06/12/19 0407 06/13/19 0308  WBC 7.8 8.0 7.3 11.5*  NEUTROABS 5.9  --   --   --   HGB 17.0 16.4 15.1 14.6  HCT 52.2* 50.7 47.1 46.6  MCV 83.8 84.1 85.2 87.6  PLT 396 402* 349 342   BMP &GFR Recent Labs  Lab 06/11/19 0947 06/11/19 1814 06/12/19 0407 06/13/19 0308  NA 137  --  133* 137  K 3.0*  --  3.7 3.9  CL 97*  --  99 103  CO2 27  --  24 24  GLUCOSE 109*  --  111* 145*  BUN 16  --  15 12  CREATININE 1.62* 1.45* 1.27* 1.23  CALCIUM 9.0  --  8.3* 8.3*  MG  --   --   --  2.1   Estimated Creatinine Clearance: 86.1 mL/min (by C-G formula based on SCr of 1.23 mg/dL). Liver & Pancreas: Recent Labs  Lab 06/11/19 0947 06/12/19 0407  AST 19 14*  ALT 25 21  ALKPHOS 123 100  BILITOT 1.1 1.3*  PROT 7.9 6.3*  ALBUMIN 4.2 3.4*   Recent Labs  Lab 06/11/19 0947  LIPASE 31   No results for input(s): AMMONIA in  the last 168 hours. Diabetic: Recent Labs    06/12/19 0407  HGBA1C 5.8*   No results for input(s): GLUCAP in the last 168 hours. Cardiac Enzymes: No results for input(s): CKTOTAL, CKMB, CKMBINDEX, TROPONINI in the last 168 hours. No results for input(s): PROBNP in the last 8760 hours. Coagulation Profile: No results for input(s): INR, PROTIME in the last 168 hours. Thyroid Function Tests: No results for input(s): TSH, T4TOTAL, FREET4, T3FREE, THYROIDAB in the last 72 hours. Lipid Profile: No results for input(s): CHOL, HDL, LDLCALC, TRIG, CHOLHDL, LDLDIRECT in the last 72 hours. Anemia Panel: No results for input(s): VITAMINB12, FOLATE, FERRITIN, TIBC, IRON, RETICCTPCT in the last 72 hours. Urine analysis:    Component Value Date/Time   COLORURINE YELLOW 06/11/2019 [place] 06/11/2019 1314   LABSPEC 1.025 06/11/2019 1314   PHURINE 6.0 06/11/2019 1314   GLUCOSEU NEGATIVE 06/11/2019 1314   HGBUR NEGATIVE 06/11/2019 1314   BILIRUBINUR SMALL (A) 06/11/2019 1314   KETONESUR 40 (A) 06/11/2019 1314   PROTEINUR 30 (A)  06/11/2019 1314   NITRITE NEGATIVE 06/11/2019 1314   LEUKOCYTESUR NEGATIVE 06/11/2019 1314   Sepsis Labs: Invalid input(s): PROCALCITONIN, [place]  Microbiology: Recent Results (from the past 240 hour(s))  SARS CORONAVIRUS 2 (TAT 6-24 HRS) Nasopharyngeal Nasopharyngeal Swab     Status: None   Collection Time: 06/11/19  2:18 PM   Specimen: Nasopharyngeal Swab  Result Value Ref Range Status   SARS Coronavirus 2 NEGATIVE NEGATIVE Final    Comment: (NOTE) SARS-CoV-2 target nucleic acids are NOT DETECTED. The SARS-CoV-2 RNA is generally detectable in upper and lower respiratory specimens during the acute phase of infection. Negative results do not preclude SARS-CoV-2 infection, do not rule out co-infections with other pathogens, and should not be used as the sole basis for treatment or other patient management decisions. Negative results must be combined with clinical observations, patient history, and epidemiological information. The expected result is Negative. Fact Sheet for Patients: SugarRoll.be Fact Sheet for Healthcare Providers: https://www.woods-mathews.com/ This test is not yet approved or cleared by the [place] FDA and  has been authorized for detection and/or diagnosis of SARS-CoV-2 by FDA under an Emergency Use Authorization (EUA). This EUA will remain  in effect (meaning this test can be used) for the duration of the COVID-19 declaration under Section 56 4(b)(1) of the Act, 21 U.S.C. section 360bbb-3(b)(1), unless the authorization is terminated or revoked sooner. Performed at [facility], [place] [address], [place], [place] [address]   Surgical pcr screen     Status: None   Collection Time: 06/12/19 12:36 PM   Specimen: Nasal Mucosa; Nasal Swab  Result Value Ref Range Status   MRSA, PCR NEGATIVE NEGATIVE Final   Staphylococcus aureus NEGATIVE NEGATIVE Final    Comment: (NOTE) The Xpert SA Assay (FDA approved  for NASAL specimens in patients 41 years of age and older), is one component of a comprehensive surveillance program. It is not intended to diagnose infection nor to guide or monitor treatment. Performed at [facility], [place] [address] [place], [place] [address]     Radiology Studies: No results found.   Taye T. [place]  If 7PM-7AM, please contact night-coverage [email or web address] Password [facility] 06/13/2019, 12:17 PM

## 2019-06-14 DIAGNOSIS — D3A098 Benign carcinoid tumors of other sites: Secondary | ICD-10-CM

## 2019-06-14 DIAGNOSIS — D3A019 Benign carcinoid tumor of the small intestine, unspecified portion: Secondary | ICD-10-CM

## 2019-06-14 LAB — MAGNESIUM: Magnesium: 2.1 mg/dL (ref 1.7–2.4)

## 2019-06-14 LAB — BASIC METABOLIC PANEL
Anion gap: 11 (ref 5–15)
BUN: 7 mg/dL (ref 6–20)
CO2: 24 mmol/L (ref 22–32)
Calcium: 8.8 mg/dL — ABNORMAL LOW (ref 8.9–10.3)
Chloride: 106 mmol/L (ref 98–111)
Creatinine, Ser: 1.02 mg/dL (ref 0.61–1.24)
GFR calc Af Amer: >60 mL/min (ref >60)
GFR calc non Af Amer: >60 mL/min (ref >60)
Glucose, Bld: 101 mg/dL — ABNORMAL HIGH (ref 70–99)
Potassium: 4 mmol/L (ref 3.5–5.1)
Sodium: 141 mmol/L (ref 135–145)

## 2019-06-14 LAB — CBC
HCT: 49.4 % (ref 39.0–52.0)
Hemoglobin: 15.1 g/dL (ref 13.0–17.0)
MCH: 27.1 pg (ref 26.0–34.0)
MCHC: 30.6 g/dL (ref 30.0–36.0)
MCV: 88.7 fL (ref 80.0–100.0)
Platelets: 280 10*3/uL (ref 150–400)
RBC: 5.57 MIL/uL (ref 4.22–5.81)
RDW: 13.8 % (ref 11.5–15.5)
WBC: 9.6 10*3/uL (ref 4.0–10.5)
nRBC: 0 % (ref 0.0–0.2)

## 2019-06-14 MED ORDER — LOSARTAN POTASSIUM 50 MG PO TABS
50.0000 mg | ORAL_TABLET | Freq: Every day | ORAL | Status: DC
  Administered 2019-06-14 – 2019-06-15 (×2): 50 mg via ORAL
  Filled 2019-06-14 (×2): qty 1

## 2019-06-14 NOTE — Progress Notes (Signed)
2 Days Post-Op    CC: Small bowel obstruction  Subjective: Patient is feeling much better this a.m.  His NG is out. He is passing flatus that he is aware of so far and tolerating clears.  Min pain  Objective: Vital signs in last 24 hours: Temp:  [98.2 F (36.8 C)-99.2 F (37.3 C)] 98.9 F (37.2 C) (11/21 0552) Pulse Rate:  [83-85] 83 (11/21 0552) Resp:  [16-18] 18 (11/21 0552) BP: (152-179)/(99-115) 173/115 (11/21 0552) SpO2:  [94 %-98 %] 94 % (11/21 0552) Last BM Date: 06/12/19  Intake/Output from previous day: 11/20 0701 - 11/21 0700 In: 2215 [P.O.:540; I.V.:1675] Out: 3050 [Urine:3050] Intake/Output this shift: Total I/O In: 240 [P.O.:240] Out: 300 [Urine:300]  General appearance: alert, cooperative and no distress Resp: no distress GI: Soft  Inc: clean, mild drainage noted.  Lab Results:  Recent Labs    06/13/19 0308 06/14/19 0353  WBC 11.5* 9.6  HGB 14.6 15.1  HCT 46.6 49.4  PLT 342 280    BMET Recent Labs    06/13/19 0308 06/14/19 0353  NA 137 141  K 3.9 4.0  CL 103 106  CO2 24 24  GLUCOSE 145* 101*  BUN 12 7  CREATININE 1.23 1.02  CALCIUM 8.3* 8.8*   PT/INR No results for input(s): LABPROT, INR in the last 72 hours.  Recent Labs  Lab 06/11/19 0947 06/12/19 0407  AST 19 14*  ALT 25 21  ALKPHOS 123 100  BILITOT 1.1 1.3*  PROT 7.9 6.3*  ALBUMIN 4.2 3.4*     Lipase     Component Value Date/Time   LIPASE 31 06/11/2019 0947     Medications: . amLODipine  10 mg Oral Daily  . heparin  5,000 Units Subcutaneous Q8H  . losartan  50 mg Oral Daily  . pantoprazole (PROTONIX) IV  40 mg Intravenous QHS   . 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 Johnathan Hausen  FEN: fulls ID: Cefotetan pre op DVT: Heparin Follow-up: Dr. Hassell Done  Plan: Advance diet as tolerated.  Will start fulls.      LOS: 3 days    Rosario Adie [id number] Please see Amion

## 2019-06-14 NOTE — Progress Notes (Signed)
PROGRESS NOTE  Dwayne Sawyer [id number] DOB: 12-Dec-1970   PCP: [facility]  Patient is from: Home  DOA: 06/11/2019 LOS: 3  Brief Narrative / Interim history: 48 year old male with history of RA, HTN, HLD, inguinal hernia and noncompliance with medication reportedly due to lack of his insurance presenting with 6 weeks of gradually worsening abdominal pain, nausea and diarrhea, and admitted 06/11/19 with high-grade SBO as noted on CT abdomen and pelvis, hypertensive urgency and AKI.  General surgery consulted.  NG tube placed 11/18.  Patient underwent laparotomy with small bowel resection on 06/12/2019.  Biopsy revealed carcinoid tumor.   Subjective: No major events overnight of this morning.  Pain almost resolved.  Tolerating clear liquid diet.  No complaints.  Objective: Vitals:   06/13/19 1350 06/13/19 1602 06/13/19 2129 06/14/19 0552  BP: (!) 179/107 (!) 158/106 (!) 152/99 (!) 173/115  Pulse: 83  85 83  Resp: 18  16 18   Temp: 99.2 F (37.3 C)  98.9 F (37.2 C) 98.9 F (37.2 C)  TempSrc: Oral  Oral Oral  SpO2: 96%  94% 94%  Weight:      Height:        Intake/Output Summary (Last 24 hours) at 06/14/2019 1155 Last data filed at 06/14/2019 1024 Gross per 24 hour  Intake 2809.42 ml  Output 3100 ml  Net -290.58 ml   Filed Weights   06/11/19 0927  Weight: 105.8 kg    Examination:  GENERAL: No acute distress.  Appears well.  HEENT: MMM.  Vision and hearing grossly intact.  NECK: Supple.  No apparent JVD.  RESP:  No IWOB. Good air movement bilaterally. CVS:  RRR. Heart sounds normal.  ABD/GI/GU: Bowel sounds present. Soft. Non tender.  Honeycomb dressing over laparotomy MSK/EXT:  Moves extremities. No apparent deformity or edema.  SKIN: Honeycomb dressing over laparotomy with small dry blood staining over lower end.  NEURO: Awake, alert and oriented appropriately.  No apparent focal neuro deficit. PSYCH: Calm. Normal affect.  Imaging  RUQ ultrasound 06/11/2019 Right lobe liver mass measuring 4.4 x 4.9 x 4.0 cm, multiple loops of fluid-filled bowels but not able to visualize common bile duct obscured by gas.  Previously reported as  stable 3.2 x 2.5 cm indeterminate lesion on OSH MRI in 2015 thought to be benign due to lack of interval growth from MRI in 2014.  CT abdomen and pelvis 06/11/2019 Revealed high-grade SBO secondary to desmoplastic response in the central right abdominal mesentery centered on a 4 cm calcified irregular/spiculated soft tissue lesion with abrupt transition zone, small lymph nodes in the abnormal right mesentery suggesting additional metastatic involvement and heterogeneous liver parenchyma likely due to geographic fatty deposition and a focal 14 mm low-density lesion in the right liver, tiny sclerotic foci in T12 vertebral body and both femoral heads.  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 Johnathan Hausen -Pathology showed carcinoid tumor.  -Advance diet as tolerated per surgery  Mesenteric carcinoid/mesenteric LAD/liver mass:  CT A/P and RUQ ultrasound as above.  -CEA within normal range.  CA 19-9 on upper side of normal at 38. -Surgical pathology of the mesentery carcinoid tumor confirmed this. -Follow serum serotonin and urinary 5-HIAA -  Discussed with oncology, Dr. Burr Medico who will arrange outpatient follow-up which is patient preference as well  Hypertensive urgency/tachycardia: BP 222/143 on presentation.  BP improved but still elevated.  Tachycardia resolved. -Continue amlodipine 10 mg daily -Added losartan at 50 mg daily -Continue labetalol as needed -Stop IV fluid -Serum serotonin and  urinary 5 HIAA as above.  AKI: Cr 1.19 (in 02/2016)> 1.62 (admit)> 1.23> 1.02.  Likely prerenal from GI loss.  Resolved. -Discontinue IV fluid -Recheck in the morning  Polycythemia: Likely from hemoconcentration.   Resolving -Monitor as needed  Hypokalemia/hyponatremia: Resolved. -Discontinue IV fluid.  Ketonuria: Likely due to dehydration  Sclerotic T12 and a lateral femoral head lesions -Malignancy work-up as above  History of rheumatoid arthritis: Off medication for about a year.  No flareup. -Outpatient follow-up  Hyperbilirubinemia: LFT and ALP within normal.  RUQ ultrasound without significant finding but poorly visualized bile duct. -We will recheck in the morning to ensure resolution  Leukocytosis/bandemia: Likely demargination.  Resolved.               DVT prophylaxis: Subcu heparin Code Status: Full code Family Communication: Patient and/or RN. Available if any question.  Disposition Plan: Anticipate discharge home once blood pressure under fair control Consultants: General surgery, oncology  Procedures:  11/18-11/20-NGT 06/12/2019-ex lap with small bowel resection and primary anastomosis by Dr. Johnathan Hausen  Microbiology summarized: 11/18-COVID-19 screen negative.  Sch Meds:  Scheduled Meds: . amLODipine  10 mg Oral Daily  . heparin  5,000 Units Subcutaneous Q8H  . losartan  50 mg Oral Daily  . pantoprazole (PROTONIX) IV  40 mg Intravenous QHS   Continuous Infusions: . dextrose 5 % and 0.45 % NaCl with KCl 20 mEq/L 75 mL/hr at 06/14/19 1125   PRN Meds:.labetalol, morphine injection, ondansetron **OR** ondansetron (ZOFRAN) IV  Antimicrobials: Anti-infectives (From admission, onward)   Start     Dose/Rate Route Frequency Ordered Stop   06/13/19 0600  cefoTEtan (CEFOTAN) 2 g in sodium chloride 0.9 % 100 mL IVPB     2 g 200 mL/hr over 30 Minutes Intravenous On call to O.R. 06/12/19 1204 06/12/19 1458   06/13/19 0230  cefoTEtan (CEFOTAN) 2  g in sodium chloride 0.9 % 100 mL IVPB     2 g 200 mL/hr over 30 Minutes Intravenous Every 12 hours 06/12/19 1752 06/13/19 0237       I have personally reviewed the following labs and images: CBC: Recent Labs  Lab 06/11/19 0947 06/11/19 1814 06/12/19 0407 06/13/19 0308 06/14/19 0353  WBC 7.8 8.0 7.3 11.5* 9.6  NEUTROABS 5.9  --   --   --   --   HGB 17.0 16.4 15.1 14.6 15.1  HCT 52.2* 50.7 47.1 46.6 49.4  MCV 83.8 84.1 85.2 87.6 88.7  PLT 396 402* 349 342 280   BMP &GFR Recent Labs  Lab 06/11/19 0947 06/11/19 1814 06/12/19 0407 06/13/19 0308 06/14/19 0353  NA 137  --  133* 137 141  K 3.0*  --  3.7 3.9 4.0  CL 97*  --  99 103 106  CO2 27  --  24 24 24   GLUCOSE 109*  --  111* 145* 101*  BUN 16  --  15 12 7   CREATININE 1.62* 1.45* 1.27* 1.23 1.02  CALCIUM 9.0  --  8.3* 8.3* 8.8*  MG  --   --   --  2.1 2.1   Estimated Creatinine Clearance: 103.8 mL/min (by C-G formula based on SCr of 1.02 mg/dL). Liver &  Pancreas: Recent Labs  Lab 06/11/19 0947 06/12/19 0407  AST 19 14*  ALT 25 21  ALKPHOS 123 100  BILITOT 1.1 1.3*  PROT 7.9 6.3*  ALBUMIN 4.2 3.4*   Recent Labs  Lab 06/11/19 0947  LIPASE 31   No results for input(s): AMMONIA in the last 168 hours. Diabetic: Recent Labs    06/12/19 0407  HGBA1C 5.8*   No results for input(s): GLUCAP in the last 168 hours. Cardiac Enzymes: No results for input(s): CKTOTAL, CKMB, CKMBINDEX, TROPONINI in the last 168 hours. No results for input(s): PROBNP in the last 8760 hours. Coagulation Profile: No results for input(s): INR, PROTIME in the last 168 hours. Thyroid Function Tests: No results for input(s): TSH, T4TOTAL, FREET4, T3FREE, THYROIDAB in the last 72 hours. Lipid Profile: No results for input(s): CHOL, HDL, LDLCALC, TRIG, CHOLHDL, LDLDIRECT in the last 72 hours. Anemia Panel: No results for input(s): VITAMINB12, FOLATE, FERRITIN, TIBC, IRON, RETICCTPCT in the last 72 hours. Urine analysis:    Component  Value Date/Time   COLORURINE YELLOW 06/11/2019 [place] 06/11/2019 1314   LABSPEC 1.025 06/11/2019 1314   PHURINE 6.0 06/11/2019 1314   GLUCOSEU NEGATIVE 06/11/2019 1314   HGBUR NEGATIVE 06/11/2019 1314   BILIRUBINUR SMALL (A) 06/11/2019 1314   KETONESUR 40 (A) 06/11/2019 1314   PROTEINUR 30 (A) 06/11/2019 1314   NITRITE NEGATIVE 06/11/2019 1314   LEUKOCYTESUR NEGATIVE 06/11/2019 1314   Sepsis Labs: Invalid input(s): PROCALCITONIN, [place]  Microbiology: Recent Results (from the past 240 hour(s))  SARS CORONAVIRUS 2 (TAT 6-24 HRS) Nasopharyngeal Nasopharyngeal Swab     Status: None   Collection Time: 06/11/19  2:18 PM   Specimen: Nasopharyngeal Swab  Result Value Ref Range Status   SARS Coronavirus 2 NEGATIVE NEGATIVE Final    Comment: (NOTE) SARS-CoV-2 target nucleic acids are NOT DETECTED. The SARS-CoV-2 RNA is generally detectable in upper and lower respiratory specimens during the acute phase of infection. Negative results do not preclude SARS-CoV-2 infection, do not rule out co-infections with other pathogens, and should not be used as the sole basis for treatment or other patient management decisions. Negative results must be combined with clinical observations, patient history, and epidemiological information. The expected result is Negative. Fact Sheet for Patients: SugarRoll.be Fact Sheet for Healthcare Providers: https://www.woods-mathews.com/ This test is not yet approved or cleared by the [place] FDA and  has been authorized for detection and/or diagnosis of SARS-CoV-2 by FDA under an Emergency Use Authorization (EUA). This EUA will remain  in effect (meaning this test can be used) for the duration of the COVID-19 declaration under Section 56 4(b)(1) of the Act, 21 U.S.C. section 360bbb-3(b)(1), unless the authorization is terminated or revoked sooner. Performed at [facility], [place] [address] [place], [place] [address]   Surgical pcr screen     Status: None   Collection Time: 06/12/19 12:36 PM   Specimen: Nasal Mucosa; Nasal Swab  Result Value Ref Range Status   MRSA, PCR NEGATIVE NEGATIVE Final   Staphylococcus aureus NEGATIVE NEGATIVE Final    Comment: (NOTE) The Xpert SA Assay (FDA approved for NASAL specimens in patients 65 years of age and older), is one component of a comprehensive surveillance program. It is not intended to diagnose infection nor to guide or monitor treatment. Performed at [facility], [place] [address], [place], [place] [address]     Radiology Studies: No results found.   Taye T. [place]  If  7PM-7AM, please contact night-coverage [email or web address] Password [facility] 06/14/2019, 11:55 AM

## 2019-06-15 LAB — BASIC METABOLIC PANEL
Anion gap: 15 (ref 5–15)
BUN: 5 mg/dL — ABNORMAL LOW (ref 6–20)
CO2: 22 mmol/L (ref 22–32)
Calcium: 9.1 mg/dL (ref 8.9–10.3)
Chloride: 104 mmol/L (ref 98–111)
Creatinine, Ser: 1.18 mg/dL (ref 0.61–1.24)
GFR calc Af Amer: >60 mL/min (ref >60)
GFR calc non Af Amer: >60 mL/min (ref >60)
Glucose, Bld: 86 mg/dL (ref 70–99)
Potassium: 3.5 mmol/L (ref 3.5–5.1)
Sodium: 141 mmol/L (ref 135–145)

## 2019-06-15 LAB — CBC
HCT: 45.8 % (ref 39.0–52.0)
Hemoglobin: 14.7 g/dL (ref 13.0–17.0)
MCH: 27.4 pg (ref 26.0–34.0)
MCHC: 32.1 g/dL (ref 30.0–36.0)
MCV: 85.4 fL (ref 80.0–100.0)
Platelets: 147 10*3/uL — ABNORMAL LOW (ref 150–400)
RBC: 5.36 MIL/uL (ref 4.22–5.81)
RDW: 13.9 % (ref 11.5–15.5)
WBC: 4.5 10*3/uL (ref 4.0–10.5)
nRBC: 0 % (ref 0.0–0.2)

## 2019-06-15 LAB — MAGNESIUM: Magnesium: 1.8 mg/dL (ref 1.7–2.4)

## 2019-06-15 MED ORDER — LISINOPRIL-HYDROCHLOROTHIAZIDE 20-12.5 MG PO TABS: 2.0000 | ORAL_TABLET | Freq: Every day | ORAL | 0 refills | Status: DC

## 2019-06-15 MED ORDER — LISINOPRIL 20 MG PO TABS: 40.0000 mg | ORAL_TABLET | Freq: Every day | ORAL | Status: DC

## 2019-06-15 MED ORDER — LISINOPRIL 20 MG PO TABS: 20.0000 mg | ORAL_TABLET | Freq: Every day | ORAL | Status: DC

## 2019-06-15 NOTE — Progress Notes (Addendum)
Dr Doristine Bosworth aware via phone Losartan and Amilodipine given prior to his new orders adjusting doses. See MD orders.

## 2019-06-15 NOTE — Discharge Summary (Signed)
Physician Discharge Summary  Dwayne Sawyer [id number] DOB: Nov 20, 1970 DOA: 06/11/2019  PCP: [facility] [place] date: 06/11/2019 Discharge date: 06/15/2019  Admitted From: Home Disposition: Home  Recommendations for Outpatient Follow-up:  1. Follow up with PCP in 1-2 weeks 2. Follow with general surgery as a scheduled by them 3. Please obtain BMP/CBC in one week 4. Please follow up on the following pending results:  Home Health: None Equipment/Devices: None  Discharge Condition: Stable CODE STATUS: Full code Diet recommendation: Regular  Subjective: Seen and examined.  No complaints.  No abdominal pain.  Tolerating regular diet.  Brief/Interim Summary: 48 year old male with history of RA, HTN, HLD, inguinal hernia and noncompliance with medication reportedly due to lack of his insurance presenting with 6 weeks of gradually worsening abdominal pain, nausea and diarrhea, and admitted 06/11/19 with high-grade SBO as noted on CT abdomen and pelvis, hypertensive urgency and AKI.  General surgery consulted.  NG tube placed 11/18 which did not help so subsequently Patient underwent laparotomy with small bowel resection on 06/12/2019.  Biopsy revealed carcinoid tumor.  Postoperatively, he was started on clear liquids which was advanced to regular diet which he has been tolerating without any symptoms. CEA within normal range.  CA 19-9 on upper side of normal at 38. Discussed with oncology, Dr. Burr Medico who will arrange outpatient follow-up which is patient preference as well.  They are able to see him on 06/17/2019 or 06/25/19/2020.  Patient's hypertensive urgency was treated by resuming his home medications that he stopped taking for several months due to lack of insurance.  He was on 10 mg amlodipine and 20 mg of lisinopril and 25 mg of hydrochlorothiazide.  Despite of this, his blood pressure still slightly elevated so he is going to be discharged on amlodipine 10 mg,  lisinopril/hydrochlorothiazide 12.5 mg combination to take 2 tablets daily.  He is a stable and comfortable going home so he is going to be discharged today.  He understands the instructions that I discussed with him in the room today.  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 Active Problems:   Bowel obstruction [facility])    Discharge Instructions  Discharge Instructions    Discharge patient   Complete by: As directed    Discharge disposition: 01-Home or Self Care   Discharge patient date: 06/15/2019     Allergies as of 06/15/2019   No Known Allergies     Medication List    STOP taking these medications   lisinopril-hydrochlorothiazide 20-25 MG tablet Commonly known as: ZESTORETIC Replaced by: lisinopril-hydrochlorothiazide 20-12.5 MG tablet     TAKE these medications   amLODipine 10 MG tablet Commonly known as: NORVASC Take 10 mg by mouth daily.   atorvastatin 10 MG tablet Commonly known as: LIPITOR Take 10 mg by mouth daily at 6 PM.   Enbrel 50 MG/ML injection Generic drug: etanercept Inject 50 mg into the skin once a week.   lisinopril-hydrochlorothiazide 20-12.5 MG tablet Commonly known as: Zestoretic Take 2 tablets by mouth daily. Replaces: lisinopril-hydrochlorothiazide 20-25 MG tablet   methotrexate 2.5 MG tablet Commonly known as: RHEUMATREX Take 17.5 mg by mouth once a week.      Follow-up Information    Johnathan Hausen, MD. Schedule an appointment as soon as possible for a visit in 2 week(s).   Specialty: General Surgery Contact information: [place] [address] [phone]        [facility] Follow up on 06/23/2019.   Specialty: General Surgery Why: for staple removal and wound check.  Our office will call you with a time Contact information: [address] [place]  [place], [place] Follow up in 1 week(s).   Specialty: Family Medicine Contact information: [place] [address] [phone]          No Known Allergies  Consultations: General surgery and oncology   Procedures/Studies: Dg Abd 1 View  Result Date: 06/12/2019 CLINICAL DATA:  Abdominal pain. EXAM: ABDOMEN - 1 VIEW COMPARISON:  June 11, 2019. FINDINGS: Stable dilated small bowel loops are noted concerning for distal small bowel obstruction. Residual contrast and stool is noted in the rectum. Nasogastric tube tip is seen in proximal stomach. IMPRESSION: Stable dilated small bowel loops concerning for distal small bowel obstruction. Electronically Signed   By: Marijo Conception M.D.   On: 06/12/2019 10:17   Dg Abd 1 View  Result Date: 06/11/2019 CLINICAL DATA:  NG tube placement EXAM: ABDOMEN - 1 VIEW COMPARISON:  CT earlier today FINDINGS: NG tube is in the stomach. Dilated small bowel loops compatible with small bowel obstruction. IMPRESSION: NG tube tip in the stomach. Continued small bowel obstruction pattern. Electronically Signed   By: Rolm Baptise M.D.   On: 06/11/2019 23:59   Ct Abdomen Pelvis W Contrast  Result Date: 06/11/2019 CLINICAL DATA:  Right upper quadrant pain and vomiting. EXAM: CT ABDOMEN AND PELVIS WITH CONTRAST TECHNIQUE: Multidetector CT imaging of the abdomen and pelvis was performed using the standard protocol following bolus administration of intravenous contrast. CONTRAST:  119mL OMNIPAQUE IOHEXOL 300 MG/ML  SOLN COMPARISON:  None. FINDINGS: Lower chest: Unremarkable. Hepatobiliary: Geographic low attenuation within the liver parenchyma is probably fatty deposition. Focal 14 mm low-density lesion identified in the right liver on 16/2. There is no evidence for gallstones, gallbladder wall thickening, or pericholecystic fluid. No intrahepatic or extrahepatic biliary dilation. Pancreas:  No focal mass lesion. No dilatation of the main duct. No intraparenchymal cyst. No peripancreatic edema. Spleen: No splenomegaly. No focal mass lesion. Adrenals/Urinary Tract: No adrenal nodule or mass. 4 mm hypodensity in the interpolar right kidney is too small to characterize 10 mm low-density cortical lesion noted upper pole left kidney with another 6 mm hypodensity in the interpolar region of the left kidney. No evidence for hydroureter. The urinary bladder appears normal for the degree of distention. Stomach/Bowel: Stomach is unremarkable. No gastric wall thickening. No evidence of outlet obstruction. Duodenum is normally positioned as is the ligament of Treitz. There is marked small-bowel obstruction with mid to distal small bowel measuring up to 5.8 cm diameter. These dilated bowel loops are fluid-filled. In the right abdomen, the abnormal bowel gradually tapers and begins demonstrating circumferential wall thickening. The abnormal small bowel demonstrates an abrupt transition (visible on axial 53/2 and coronal 41/5) immediately adjacent to central mesenteric desmoplastic response. This desmoplasia is centered on a 3.8 x 2.3 x 2.8 cm mesenteric soft tissue lesion with several associated punctate calcifications. Small bowel in this region is tethered to the mesenteric nodule in there are numerous adjacent small mesenteric lymph nodes. The distal and terminal ileum is decompressed. Colon is diffusely decompressed. Vascular/Lymphatic: There is abdominal aortic atherosclerosis without aneurysm. No gastrohepatic or hepato duodenal ligament lymphadenopathy. As above, numerous small nodes are seen in the mesentery of the right abdomen. No pelvic sidewall  lymphadenopathy. Reproductive: Prostate gland is mildly enlarged. Other: Small volume interloop mesenteric fluid noted without substantial free fluid in the pelvis. Musculoskeletal: 6 mm sclerotic focus noted in the T12 vertebral body. Tiny sclerotic foci seen in  each femoral head. Small bilateral groin hernias contain only fat. IMPRESSION: 1. High-grade small bowel obstruction secondary to desmoplastic response in the central right abdominal mesentery centered on a 4 cm calcified irregular/spiculated soft tissue lesion. Abrupt small bowel transition zone is identified immediately lead adjacent to this mesenteric lesion and multiple adjacent bowel loops are tethered into this region with mesenteric edema/congestion. The loop of small bowel immediately proximal to the transition zone shows mild circumferential wall thickening but no pneumatosis. 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 2. Small lymph nodes in the abnormal right mesentery suggest additional metastatic involvement. 3. Heterogeneous liver parenchyma, likely secondary to geographic fatty deposition. There is a focal 14 mm low-density lesion in the right liver. The patient had an MRI in the [facility] system on 08/09/2013 to evaluate a right liver lesion, but those images are not available. Follow-up MRI without and with contrast recommended to exclude metastatic disease. 4. Tiny sclerotic foci in the T12 vertebral body in both femoral heads are likely benign, but close attention on follow-up recommended. 5.  Aortic Atherosclerois (ICD10-170.0) Electronically Signed   By: Misty Stanley M.D.   On: 06/11/2019 13:34   US Abdomen Limited  Result Date: 06/11/2019 CLINICAL DATA:  Upper abdominal pain EXAM: ULTRASOUND ABDOMEN LIMITED RIGHT UPPER QUADRANT COMPARISON:  Report of prior abdominal MRI August 09, 2013 available; images from that study cannot be retrieved. FINDINGS: Gallbladder: No gallstones or wall thickening visualized. No pericholecystic fluid evident. No sonographic Murphy sign noted by sonographer. Common bile duct: Diameter: 3 mm. No intrahepatic or extrahepatic biliary duct dilatation evident. Note that much of the  common bile duct is obscured by gas. Liver: There is a solid mass in the right lobe of the liver measuring 4.4 x 4.9 x 4.0 cm which does not have a typical hemangioma appearance. No other focal liver lesion is evident on this study. There is diffuse increase in liver echogenicity, a finding indicative of hepatic steatosis. Portal vein is patent on color Doppler imaging with normal direction of blood flow towards the liver. Other: Multiple fluid-filled loops of bowel are noted throughout the upper abdomen. IMPRESSION: 1. There is a solid mass in the right lobe of the liver which based on current measurements may have enlarged since the prior study from 2015. Images from the previous MR at that time cannot be retrieved. Given this circumstance, it may be prudent to correlate with pre and serial post-contrast MR or CT of the liver to further evaluate. There is underlying diffuse increase in liver echogenicity, a finding indicative of hepatic steatosis. 2. Multiple loops of fluid-filled bowel. Question a degree of ileus or enteritis. 3. Study otherwise unremarkable. Note that much of the common bile duct is obscured by gas. Electronically Signed   By: Lowella Grip III M.D.   On: 06/11/2019 11:09     Discharge Exam: Vitals:   06/15/19 0919 06/15/19 1024  BP: (!) 155/105 (!) 157/92  Pulse: 82 98  Resp:    Temp:    SpO2:     Vitals:   06/15/19 0650 06/15/19 0902 06/15/19 0919 06/15/19 1024  BP: (!) 156/107 (!) 179/108 (!) 155/105 (!) 157/92  Pulse: 79 86 82 98  Resp: 20  Temp: 99.4 F (37.4 C)     TempSrc: Oral     SpO2: 95%     Weight:      Height:        General: Pt is alert, awake, not in acute distress Cardiovascular: RRR, S1/S2 +, no rubs, no gallops Respiratory: CTA bilaterally, no wheezing, no rhonchi Abdominal: Soft, NT, ND, bowel sounds + Staples noted. Extremities: no edema, no cyanosis    The results of significant diagnostics from this hospitalization (including  imaging, microbiology, ancillary and laboratory) are listed below for reference.     Microbiology: Recent Results (from the past 240 hour(s))  SARS CORONAVIRUS 2 (TAT 6-24 HRS) Nasopharyngeal Nasopharyngeal Swab     Status: None   Collection Time: 06/11/19  2:18 PM   Specimen: Nasopharyngeal Swab  Result Value Ref Range Status   SARS Coronavirus 2 NEGATIVE NEGATIVE Final    Comment: (NOTE) SARS-CoV-2 target nucleic acids are NOT DETECTED. The SARS-CoV-2 RNA is generally detectable in upper and lower respiratory specimens during the acute phase of infection. Negative results do not preclude SARS-CoV-2 infection, do not rule out co-infections with other pathogens, and should not be used as the sole basis for treatment or other patient management decisions. Negative results must be combined with clinical observations, patient history, and epidemiological information. The expected result is Negative. Fact Sheet for Patients: SugarRoll.be Fact Sheet for Healthcare Providers: https://www.woods-mathews.com/ This test is not yet approved or cleared by the [place] FDA and  has been authorized for detection and/or diagnosis of SARS-CoV-2 by FDA under an Emergency Use Authorization (EUA). This EUA will remain  in effect (meaning this test can be used) for the duration of the COVID-19 declaration under Section 56 4(b)(1) of the Act, 21 U.S.C. section 360bbb-3(b)(1), unless the authorization is terminated or revoked sooner. Performed at [facility], [place] [address], [place], [place] [address]   Surgical pcr screen     Status: None   Collection Time: 06/12/19 12:36 PM   Specimen: Nasal Mucosa; Nasal Swab  Result Value Ref Range Status   MRSA, PCR NEGATIVE NEGATIVE Final   Staphylococcus aureus NEGATIVE NEGATIVE Final    Comment: (NOTE) The Xpert SA Assay (FDA approved for NASAL specimens in patients 33 years of age and older), is one  component of a comprehensive surveillance program. It is not intended to diagnose infection nor to guide or monitor treatment. Performed at [facility], [place] [address], [place], [place] [address]      Labs: BNP (last 3 results) No results for input(s): BNP in the last 8760 hours. Basic Metabolic Panel: Recent Labs  Lab 06/11/19 0947 06/11/19 1814 06/12/19 0407 06/13/19 0308 06/14/19 0353  NA 137  --  133* 137 141  K 3.0*  --  3.7 3.9 4.0  CL 97*  --  99 103 106  CO2 27  --  24 24 24   GLUCOSE 109*  --  111* 145* 101*  BUN 16  --  15 12 7   CREATININE 1.62* 1.45* 1.27* 1.23 1.02  CALCIUM 9.0  --  8.3* 8.3* 8.8*  MG  --   --   --  2.1 2.1   Liver Function Tests: Recent Labs  Lab 06/11/19 0947 06/12/19 0407  AST 19 14*  ALT 25 21  ALKPHOS 123 100  BILITOT 1.1 1.3*  PROT 7.9 6.3*  ALBUMIN 4.2 3.4*   Recent Labs  Lab 06/11/19 0947  LIPASE 31   No results for  input(s): AMMONIA in the last 168 hours. CBC: Recent Labs  Lab 06/11/19 0947 06/11/19 1814 06/12/19 0407 06/13/19 0308 06/14/19 0353 06/15/19 0504  WBC 7.8 8.0 7.3 11.5* 9.6 4.5  NEUTROABS 5.9  --   --   --   --   --   HGB 17.0 16.4 15.1 14.6 15.1 14.7  HCT 52.2* 50.7 47.1 46.6 49.4 45.8  MCV 83.8 84.1 85.2 87.6 88.7 85.4  PLT 396 402* 349 342 280 147*   Cardiac Enzymes: No results for input(s): CKTOTAL, CKMB, CKMBINDEX, TROPONINI in the last 168 hours. BNP: Invalid input(s): POCBNP CBG: No results for input(s): GLUCAP in the last 168 hours. D-Dimer No results for input(s): DDIMER in the last 72 hours. Hgb A1c No results for input(s): HGBA1C in the last 72 hours. Lipid Profile No results for input(s): CHOL, HDL, LDLCALC, TRIG, CHOLHDL, LDLDIRECT in the last 72 hours. Thyroid function studies No results for input(s): TSH, T4TOTAL, T3FREE, THYROIDAB in the last 72 hours.  Invalid input(s): FREET3 Anemia work up No results for input(s): VITAMINB12, FOLATE, FERRITIN, TIBC,  IRON, RETICCTPCT in the last 72 hours. Urinalysis    Component Value Date/Time   COLORURINE YELLOW 06/11/2019 1314   APPEARANCEUR CLEAR 06/11/2019 1314   LABSPEC 1.025 06/11/2019 1314   PHURINE 6.0 06/11/2019 1314   GLUCOSEU NEGATIVE 06/11/2019 1314   HGBUR NEGATIVE 06/11/2019 1314   BILIRUBINUR SMALL (A) 06/11/2019 1314   KETONESUR 40 (A) 06/11/2019 1314   PROTEINUR 30 (A) 06/11/2019 1314   NITRITE NEGATIVE 06/11/2019 1314   LEUKOCYTESUR NEGATIVE 06/11/2019 1314   Sepsis Labs Invalid input(s): PROCALCITONIN,  WBC,  LACTICIDVEN Microbiology Recent Results (from the past 240 hour(s))  SARS CORONAVIRUS 2 (TAT 6-24 HRS) Nasopharyngeal Nasopharyngeal Swab     Status: None   Collection Time: 06/11/19  2:18 PM   Specimen: Nasopharyngeal Swab  Result Value Ref Range Status   SARS Coronavirus 2 NEGATIVE NEGATIVE Final    Comment: (NOTE) SARS-CoV-2 target nucleic acids are NOT DETECTED. The SARS-CoV-2 RNA is generally detectable in upper and lower respiratory specimens during the acute phase of infection. Negative results do not preclude SARS-CoV-2 infection, do not rule out co-infections with other pathogens, and should not be used as the sole basis for treatment or other patient management decisions. Negative results must be combined with clinical observations, patient history, and epidemiological information. The expected result is Negative. Fact Sheet for Patients: SugarRoll.be Fact Sheet for Healthcare Providers: https://www.woods-mathews.com/ This test is not yet approved or cleared by the [place] FDA and  has been authorized for detection and/or diagnosis of SARS-CoV-2 by FDA under an Emergency Use Authorization (EUA). This EUA will remain  in effect (meaning this test can be used) for the duration of the COVID-19 declaration under Section 56 4(b)(1) of the Act, 21 U.S.C. section 360bbb-3(b)(1), unless the authorization is  terminated or revoked sooner. Performed at [facility], [place] [address], [place], [place] [address]   Surgical pcr screen     Status: None   Collection Time: 06/12/19 12:36 PM   Specimen: Nasal Mucosa; Nasal Swab  Result Value Ref Range Status   MRSA, PCR NEGATIVE NEGATIVE Final   Staphylococcus aureus NEGATIVE NEGATIVE Final    Comment: (NOTE) The Xpert SA Assay (FDA approved for NASAL specimens in patients 56 years of age and older), is one component of a comprehensive surveillance program. It is not intended to diagnose infection nor to guide or monitor treatment. Performed at [facility],  [place] [address], [place], [place] [address]      Time coordinating discharge: Over 30 minutes  SIGNED:   Darliss Cheney, MD  [facility] 06/15/2019, 11:14 AM  If 7PM-7AM, please contact night-coverage [email or web address] Password [email or web address]

## 2019-06-15 NOTE — Discharge Instructions (Signed)

## 2019-06-15 NOTE — Progress Notes (Signed)
Assessment unchanged. Pt verbalized understanding of dc instructions through teach back. IV's removed. Discharged via wc to front entrance accompanied by nurse.

## 2019-06-15 NOTE — Progress Notes (Signed)
3 Days Post-Op    CC: Small bowel obstruction  Subjective: Patient is feeling much better this a.m. Min pain.  Having bowel function.  Tolerating a diet  Objective: Vital signs in last 24 hours: Temp:  [98.5 F (36.9 C)-99.4 F (37.4 C)] 99.4 F (37.4 C) (11/22 0650) Pulse Rate:  [79-91] 79 (11/22 0650) Resp:  [16-20] 20 (11/22 0650) BP: (155-168)/(93-107) 156/107 (11/22 0650) SpO2:  [95 %-98 %] 95 % (11/22 0650) Last BM Date: 06/14/19  Intake/Output from previous day: 11/21 0701 - 11/22 0700 In: 2008.8 [P.O.:720; I.V.:1288.8] Out: 2525 [Urine:2525] Intake/Output this shift: No intake/output data recorded.  General appearance: alert, cooperative and no distress Resp: no distress GI: Soft  Inc: clean, no drainage noted.  Lab Results:  Recent Labs    06/14/19 0353 06/15/19 0504  WBC 9.6 4.5  HGB 15.1 14.7  HCT 49.4 45.8  PLT 280 147*    BMET Recent Labs    06/13/19 0308 06/14/19 0353  NA 137 141  K 3.9 4.0  CL 103 106  CO2 24 24  GLUCOSE 145* 101*  BUN 12 7  CREATININE 1.23 1.02  CALCIUM 8.3* 8.8*   PT/INR No results for input(s): LABPROT, INR in the last 72 hours.  Recent Labs  Lab 06/11/19 0947 06/12/19 0407  AST 19 14*  ALT 25 21  ALKPHOS 123 100  BILITOT 1.1 1.3*  PROT 7.9 6.3*  ALBUMIN 4.2 3.4*     Lipase     Component Value Date/Time   LIPASE 31 06/11/2019 0947     Medications: . amLODipine  10 mg Oral Daily  . heparin  5,000 Units Subcutaneous Q8H  . losartan  50 mg Oral Daily  . 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 Johnathan Hausen  FEN: reg diet ID: [email or web address] pre  op DVT: Heparin Follow-up: Dr. Hassell Done  Plan: Ok to d/c from our standpoint    LOS: 4 days    Rosario Adie [id number] Please see Amion

## 2019-06-22 LAB — 5 HIAA, QUANTITATIVE, URINE, 24 HOUR
5-HIAA, Ur: 4.1 mg/L
5-HIAA,Quant.,24 Hr Urine: 19.7 mg/24 hr — ABNORMAL HIGH (ref 0.0–14.9)
Total Volume: 4800

## 2019-06-23 LAB — SEROTONIN SERUM: Serotonin, Serum: 1565 ng/mL — ABNORMAL HIGH (ref 21–321)

## 2019-06-24 NOTE — Progress Notes (Addendum)
[name]  [phone] [phone]  Clinic New Consult Note   Patient Care Team: [place] as PCP - General (Family Medicine) Date of service: 06/25/2019   CHIEF COMPLAINTS/PURPOSE OF CONSULTATION:  Neuroendocrine tumor, referred by hospitalist Dr. Wendee Beavers   HISTORY OF PRESENTING ILLNESS:  Dwayne Sawyer 48 y.o. male is here because of newly diagnosed neuroendocrine tumor of the small bowel. He presented to the ED on 06/11/19 with 5 week h/o RUQ pain, diarrhea, and 50 lbs weight loss.  Pain worsened by eating and lying flat. He had previous endoscopies in 2010 for GI bleeding that patient reports showed only a polyp. Work up in the ED showed K 3.0 and Cr. 1.62. Abdominal US showed a 4.4 x4.9 x4.0 cm solid mass in the right lobe liver as well as multiple loops of fluid-filled bowel, possible ileus versus enteritis.  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  Additionally there were small lymph nodes in the right mesentery concerning for metastasis.  There was tiny sclerotic foci in the T12 vertebral body and both femoral heads, which were felt likely benign. Notably a historical MRI in 08/09/2013 at [facility] showed a 3.2 x2.5 cm liver lesion in the right hepatic lobe (segment 7). Baseline CEA is normal, CA 19-9 mildly elevated at 38. SBO did not resolve with NG tube placement. He was taken to the OR on 06/12/2019 by Dr. Johnathan Hausen and underwent laparotomy with small bowel resection and primary anastomosis.  Pathology confirmed a well-differentiated neuroendocrine tumor in the jejunum measuring 5.0 cm, tumor invades adjacent loops of small bowel.   There is perineural and extensive lymphovascular space invasion.  There is metastatic neuroendocrine tumor involving 14 of 32 lymph nodes, margins are clear. Staged pT4pN2. Mitotic rate <2 mitoses/mm2 and KI-67 < 3%.  The neoplastic cells are positive for synaptophysin and chromogranin.  AKI was felt to be related to hypertensive crisis that resolved after restarting home meds which had expired. He is in the process of changing PCP. Postop course was uneventful and he was discharged on 06/15/2019.  PMH is significant for RA previously on methotrexate and Enbrel, HTN, HLD.  Socially, he is married with 3 children. He works in Sales executive at [employer]. He drives and does all ADLs independently. Drinks alcohol rarely, uses smokeless tobacco "dip" for 40 years. Denies other drug use. Family history is significant for metastatic neuroendocrine tumor in his father who ultimately died of a blood clot.   Today, Mr. Word presents to clinic alone. He feels well. He feels mostly recovered from surgery, denies abdominal pain. Bowels normal now, diarrhea resolved. No major flushing. Denies fever, chills, cough, chest pain, dyspnea, leg swelling, or other concerns.   MEDICAL HISTORY:  Past Medical History:  Diagnosis Date   Arthritis    High cholesterol    Hypertension     SURGICAL HISTORY: Past Surgical History:  Procedure Laterality Date   HERNIA REPAIR     LAPAROTOMY N/A 06/12/2019   Procedure: EXPLORATORY LAPAROTOMY WITH SMALL BOWEL RESECTION;  Surgeon: Johnathan Hausen, MD;  Location: [facility];  Service: General;  Laterality: N/A;   SHOULDER SURGERY      SOCIAL HISTORY: Social History   Socioeconomic History   Marital status:  Married    Spouse name: Not on file   Number of children: Not on file   Years of education: Not on file   Highest education level: Not on file  Occupational History   Not on file  Social Needs   Financial resource strain: Not on file   Food  insecurity    Worry: Not on file    Inability: Not on file   Transportation needs    Medical: Not on file    Non-medical: Not on file  Tobacco Use   Smoking status: Never Smoker   Smokeless tobacco: Current User    Types: Chew  Substance and Sexual Activity   Alcohol use: Yes    Comment: occ   Drug use: Never   Sexual activity: Not on file  Lifestyle   Physical activity    Days per week: Not on file    Minutes per session: Not on file   Stress: Not on file  Relationships   Social connections    Talks on phone: Not on file    Gets together: Not on file    Attends religious service: Not on file    Active member of club or organization: Not on file    Attends meetings of clubs or organizations: Not on file    Relationship status: Not on file   Intimate partner violence    Fear of current or ex partner: Not on file    Emotionally abused: Not on file    Physically abused: Not on file    Forced sexual activity: Not on file  Other Topics Concern   Not on file  Social History Narrative   Not on file    FAMILY HISTORY: No family history on file.  ALLERGIES:  has No Known Allergies.  MEDICATIONS:  Current Outpatient Medications  Medication Sig Dispense Refill   amLODipine (NORVASC) 10 MG tablet Take 1 tablet (10 mg total) by mouth daily. 30 tablet 0   atorvastatin (LIPITOR) 10 MG tablet Take 10 mg by mouth daily at 6 PM.      lisinopril-hydrochlorothiazide (ZESTORETIC) 20-12.5 MG tablet Take 2 tablets by mouth daily. 60 tablet 0   etanercept (ENBREL) 50 MG/ML injection Inject 50 mg into the skin once a week.      methotrexate (RHEUMATREX) 2.5 MG tablet Take 17.5 mg by mouth once a week.      No current facility-administered medications for this visit.     REVIEW OF SYSTEMS:   Constitutional: Denies fevers, chills or abnormal night sweats (+) mild fatigue (+) weight loss 50 lbs   Eyes: Denies blurriness of vision, double vision or watery eyes Ears,  nose, mouth, throat, and face: Denies mucositis or sore throat Respiratory: Denies cough, dyspnea or wheezes Cardiovascular: Denies palpitation, chest discomfort or lower extremity swelling Gastrointestinal:  Denies nausea, heartburn  (+) diarrhea at presentation (+) RUQ pain at presentation Skin: Denies abnormal skin rashes (+) mild flushing  Lymphatics: Denies new lymphadenopathy or easy bruising Neurological:Denies numbness, tingling or new weaknesses MSK: denies back or bone pain  Behavioral/Psych: Mood is stable, no new changes  All other systems were reviewed with the patient and are negative.  PHYSICAL EXAMINATION: ECOG PERFORMANCE STATUS: 0 - Asymptomatic  Vitals:   06/25/19 1355  BP: (!) 165/112  Pulse: 100  Resp: 20  Temp: 98 F (36.7 C)  SpO2: 100%   Filed Weights   06/25/19 1355  Weight: 225 lb 6.4 oz (102.2 kg)    GENERAL:alert, no  distress and comfortable SKIN: no rash. Mild facial flushing behind mask  EYES: sclera clear NECK: without mass LUNGS: clear with normal breathing effort HEART: regular rate & rhythm, no lower extremity edema ABDOMEN: abdomen soft and round, non-tender and normal bowel sounds. Midline incision healing well, no erythema or drainage  Musculoskeletal: no cyanosis of digits and no clubbing  PSYCH: alert & oriented x 3 with fluent speech NEURO: no focal motor/sensory deficits  LABORATORY DATA:  I have reviewed the data as listed CBC Latest Ref Rng & Units 06/15/2019 06/14/2019 06/13/2019  WBC 4.0 - 10.5 K/uL 4.5 9.6 11.5(H)  Hemoglobin 13.0 - 17.0 g/dL 14.7 15.1 14.6  Hematocrit 39.0 - 52.0 % 45.8 49.4 46.6  Platelets 150 - 400 K/uL 147(L) 280 342   CMP Latest Ref Rng & Units 06/15/2019 06/14/2019 06/13/2019  Glucose 70 - 99 mg/dL 86 101(H) 145(H)  BUN 6 - 20 mg/dL 5(L) 7 12  Creatinine 0.61 - 1.24 mg/dL 1.18 1.02 1.23  Sodium 135 - 145 mmol/L 141 141 137  Potassium 3.5 - 5.1 mmol/L 3.5 4.0 3.9  Chloride 98 - 111 mmol/L 104 106  103  CO2 22 - 32 mmol/L '22 24 24  ' Calcium 8.9 - 10.3 mg/dL 9.1 8.8(L) 8.3(L)  Total Protein 6.5 - 8.1 g/dL - - -  Total Bilirubin 0.3 - 1.2 mg/dL - - -  Alkaline Phos 38 - 126 U/L - - -  AST 15 - 41 U/L - - -  ALT 0 - 44 U/L - - -    RADIOGRAPHIC STUDIES: I have personally reviewed the radiological images as listed and agreed with the findings in the report. Dg Abd 1 View  Result Date: 06/12/2019 CLINICAL DATA:  Abdominal pain. EXAM: ABDOMEN - 1 VIEW COMPARISON:  June 11, 2019. FINDINGS: Stable dilated small bowel loops are noted concerning for distal small bowel obstruction. Residual contrast and stool is noted in the rectum. Nasogastric tube tip is seen in proximal stomach. IMPRESSION: Stable dilated small bowel loops concerning for distal small bowel obstruction. Electronically Signed   By: Marijo Conception M.D.   On: 06/12/2019 10:17   Dg Abd 1 View  Result Date: 06/11/2019 CLINICAL DATA:  NG tube placement EXAM: ABDOMEN - 1 VIEW COMPARISON:  CT earlier today FINDINGS: NG tube is in the stomach. Dilated small bowel loops compatible with small bowel obstruction. IMPRESSION: NG tube tip in the stomach. Continued small bowel obstruction pattern. Electronically Signed   By: Rolm Baptise M.D.   On: 06/11/2019 23:59   Ct Abdomen Pelvis W Contrast  Result Date: 06/11/2019 CLINICAL DATA:  Right upper quadrant pain and vomiting. EXAM: CT ABDOMEN AND PELVIS WITH CONTRAST TECHNIQUE: Multidetector CT imaging of the abdomen and pelvis was performed using the standard protocol following bolus administration of intravenous contrast. CONTRAST:  112m OMNIPAQUE IOHEXOL 300 MG/ML  SOLN COMPARISON:  None. FINDINGS: Lower chest: Unremarkable. Hepatobiliary: Geographic low attenuation within the liver parenchyma is probably fatty deposition. Focal 14 mm low-density lesion identified in the right liver on 16/2. There is no evidence for gallstones, gallbladder wall thickening, or pericholecystic fluid. No  intrahepatic or extrahepatic biliary dilation. Pancreas: No focal mass lesion. No dilatation of the main duct. No intraparenchymal cyst. No peripancreatic edema. Spleen: No splenomegaly. No focal mass lesion. Adrenals/Urinary Tract: No adrenal nodule or mass. 4 mm hypodensity in the interpolar right kidney is too small to characterize 10 mm low-density cortical lesion noted upper pole left kidney with another 6 mm hypodensity  in the interpolar region of the left kidney. No evidence for hydroureter. The urinary bladder appears normal for the degree of distention. Stomach/Bowel: Stomach is unremarkable. No gastric wall thickening. No evidence of outlet obstruction. Duodenum is normally positioned as is the ligament of Treitz. There is marked small-bowel obstruction with mid to distal small bowel measuring up to 5.8 cm diameter. These dilated bowel loops are fluid-filled. In the right abdomen, the abnormal bowel gradually tapers and begins demonstrating circumferential wall thickening. The abnormal small bowel demonstrates an abrupt transition (visible on axial 53/2 and coronal 41/5) immediately adjacent to central mesenteric desmoplastic response. This desmoplasia is centered on a 3.8 x 2.3 x 2.8 cm mesenteric soft tissue lesion with several associated punctate calcifications. Small bowel in this region is tethered to the mesenteric nodule in there are numerous adjacent small mesenteric lymph nodes. The distal and terminal ileum is decompressed. Colon is diffusely decompressed. Vascular/Lymphatic: There is abdominal aortic atherosclerosis without aneurysm. No gastrohepatic or hepato duodenal ligament lymphadenopathy. As above, numerous small nodes are seen in the mesentery of the right abdomen. No pelvic sidewall lymphadenopathy. Reproductive: Prostate gland is mildly enlarged. Other: Small volume interloop mesenteric fluid noted without substantial free fluid in the pelvis. Musculoskeletal: 6 mm sclerotic focus noted  in the T12 vertebral body. Tiny sclerotic foci seen in each femoral head. Small bilateral groin hernias contain only fat. IMPRESSION: 1. High-grade small bowel obstruction secondary to desmoplastic response in the central right abdominal mesentery centered on a 4 cm calcified irregular/spiculated soft tissue lesion. Abrupt small bowel transition zone is identified immediately lead adjacent to this mesenteric lesion and multiple adjacent bowel loops are tethered into this region with mesenteric edema/congestion. The loop of small bowel immediately proximal to the transition zone shows mild circumferential wall thickening but no pneumatosis. 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 2. Small lymph nodes in the abnormal right mesentery suggest additional metastatic involvement. 3. Heterogeneous liver parenchyma, likely secondary to geographic fatty deposition. There is a focal 14 mm low-density lesion in the right liver. The patient had an MRI in the [facility] system on 08/09/2013 to evaluate a right liver lesion, but those images are not available. Follow-up MRI without and with contrast recommended to exclude metastatic disease. 4. Tiny sclerotic foci in the T12 vertebral body in both femoral heads are likely benign, but close attention on follow-up recommended. 5.  Aortic Atherosclerois (ICD10-170.0) Electronically Signed   By: Misty Stanley M.D.   On: 06/11/2019 13:34   US Abdomen Limited  Result Date: 06/11/2019 CLINICAL DATA:  Upper abdominal pain EXAM: ULTRASOUND ABDOMEN LIMITED RIGHT UPPER QUADRANT COMPARISON:  Report of prior abdominal MRI August 09, 2013 available; images from that study cannot be retrieved. FINDINGS: Gallbladder: No gallstones or wall thickening visualized. No pericholecystic fluid evident. No sonographic Murphy sign noted by sonographer. Common bile duct: Diameter: 3 mm. No intrahepatic or extrahepatic  biliary duct dilatation evident. Note that much of the common bile duct is obscured by gas. Liver: There is a solid mass in the right lobe of the liver measuring 4.4 x 4.9 x 4.0 cm which does not have a typical hemangioma appearance. No other focal liver lesion is evident on this study. There is diffuse increase in liver echogenicity, a finding indicative of hepatic steatosis. Portal vein is patent on color Doppler imaging with normal direction of blood flow towards the liver. Other: Multiple fluid-filled loops of bowel are noted throughout the upper  abdomen. IMPRESSION: 1. There is a solid mass in the right lobe of the liver which based on current measurements may have enlarged since the prior study from 2015. Images from the previous MR at that time cannot be retrieved. Given this circumstance, it may be prudent to correlate with pre and serial post-contrast MR or CT of the liver to further evaluate. There is underlying diffuse increase in liver echogenicity, a finding indicative of hepatic steatosis. 2. Multiple loops of fluid-filled bowel. Question a degree of ileus or enteritis. 3. Study otherwise unremarkable. Note that much of the common bile duct is obscured by gas. Electronically Signed   By: Lowella Grip III M.D.   On: 06/11/2019 11:09    ASSESSMENT & PLAN: 47 yo male with  1. 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 He presented with abdominal pain, diarrhea, and weight loss and was found to have SBO -He underwent resection on 11/19, pathology confirmed well differentiated neuroendocrine tumor, measuring 5 cm with metastasis to 14 of 32 LNs. Margins clear. It was completely resected based on path findings. Mitotic rate <2, however he does have certain high risk features such as PN and LV space invasion.  -We reviewed that his cancer was at least locally advanced,  and has high risk for recurrence. However Dr. Burr Medico discussed there is no role for adjuvant therapy.  -We discussed the liver lesion in the right hepatic lobe, which was seen on MRI from [facility] in 2015 and was larger then. We are not able to see the images for direct comparison. If this is the same lesion, and smaller now, it may be benign.  -Dr. Burr Medico recommends to proceed with PET dotatate to evaluate for residual or metastatic disease.  -If PET is negative, then he will begin cancer surveillance; if liver lesion is positive we would recommend biopsy. If no other metastasis he might be a candidate for surgical resection. The patient agrees with the plan.  -Mr. Perkey has recovered well from surgery, no pain. Diarrhea resolved after surgery. Labs stable  -Ur 24 hour 5HIAA was elevated on 11/20 to 19.4, will repeat and check chromogranin A at next visit  -f/u after PET scan   2. HTN and recent AKI -likely related to being off meds for several months  -he is in transition from PCP, needs new provider. I referred him to [facility] community health [facility] wellness  -refilled amlodipine and lisinopril-HCTZ today  3. RA -previously on methotrexate and enbrel -not currently on therapy   PLAN: -Medical record reviewed -Proceed with PET dotatate and possibly liver biopsy if PET positive -F/u after PET to review recommendations  -Refilled anti-hypertensive meds -Referral to internal med to establish with PCP  Orders Placed This Encounter  Procedures   NM PET (NETSPOT GA 77 DOTATATE) SKULL BASE TO MID THIGH    Standing Status:   Future    Standing Expiration Date:   08/25/2020    Order Specific Question:   If indicated for the ordered procedure, I authorize the administration of a radiopharmaceutical per Radiology protocol    Answer:   Yes    Order Specific Question:   Preferred imaging location?    Answer:   [facility]    Order Specific Question:   Radiology Contrast Protocol - do NOT remove file  path    Answer:   [email or web address]   Chromogranin A  Standing Status:   Standing    Number of Occurrences:   20    Standing Expiration Date:   06/24/2020   24 Hr Ur 5 HIAA Qnt    Standing Status:   Standing    Number of Occurrences:   20    Standing Expiration Date:   06/24/2020   CBC with Differential ([facility] Only)    Standing Status:   Standing    Number of Occurrences:   20    Standing Expiration Date:   06/24/2020   CMP ([facility] only)    Standing Status:   Standing    Number of Occurrences:   20    Standing Expiration Date:   06/24/2020   Ambulatory referral to Internal Medicine    Referral Priority:   Routine    Referral Type:   Consultation    Referral Reason:   Specialty Services Required    Requested Specialty:   Internal Medicine    Number of Visits Requested:   1    All questions were answered. The patient knows to call the clinic with any problems, questions or concerns. I spent 50 minutes counseling the patient face to face. The total time spent in the appointment was 60 minutes and more than 50% was on review of test results and counseling.     Alla Feeling, NP 06/27/2019 10:12 AM  Addendum  I have seen the patient, examined him. I agree with the assessment and and plan and have edited the notes.   I have reviewed his images in person and discussed the surgical path and images findings with pt in detail. He had a 3.2X2.5cm liver lesion in segment 7 of right lobe in 07/2017 MRI which was done at [facility], so the 1.4cm lesion in right lobe of liver on recent CT 06/11/2019 may be benign. I recommend a Dotatate-PET scan for further evaluation. If PET is negative for residual or metastatic disease, we will watch him.  Although he had at least locally advanced NET of small bowel, and high risk for recurrence, there is no role for adjuvant therapy. If the liver lesion is positive on PET, will obtain liver biopsy and may refer him for  surgical resection if no other metastatic disease.   Will call him or see him back after PET scan.   All questions were answered, he knows to call us if he has any further questions.  Truitt Merle  06/25/2019

## 2019-06-25 ENCOUNTER — Other Ambulatory Visit: Payer: Self-pay

## 2019-06-25 ENCOUNTER — Inpatient Hospital Stay: Payer: Self-pay | Attending: Nurse Practitioner | Admitting: Nurse Practitioner

## 2019-06-25 VITALS — BP 165/112 | HR 100 | Temp 98.0°F | Resp 20 | Ht 69.0 in | Wt 225.4 lb

## 2019-06-25 DIAGNOSIS — C7B01 Secondary carcinoid tumors of distant lymph nodes: Secondary | ICD-10-CM | POA: Insufficient documentation

## 2019-06-25 DIAGNOSIS — C7A8 Other malignant neuroendocrine tumors: Secondary | ICD-10-CM | POA: Insufficient documentation

## 2019-06-25 DIAGNOSIS — Z79899 Other long term (current) drug therapy: Secondary | ICD-10-CM | POA: Insufficient documentation

## 2019-06-25 DIAGNOSIS — D3A098 Benign carcinoid tumors of other sites: Secondary | ICD-10-CM

## 2019-06-25 DIAGNOSIS — D3A011 Benign carcinoid tumor of the jejunum: Secondary | ICD-10-CM | POA: Insufficient documentation

## 2019-06-25 DIAGNOSIS — I10 Essential (primary) hypertension: Secondary | ICD-10-CM | POA: Insufficient documentation

## 2019-06-25 DIAGNOSIS — R911 Solitary pulmonary nodule: Secondary | ICD-10-CM | POA: Insufficient documentation

## 2019-06-25 DIAGNOSIS — M069 Rheumatoid arthritis, unspecified: Secondary | ICD-10-CM | POA: Insufficient documentation

## 2019-06-25 MED ORDER — LISINOPRIL-HYDROCHLOROTHIAZIDE 20-12.5 MG PO TABS: 2.0000 | ORAL_TABLET | Freq: Every day | ORAL | 0 refills | Status: DC

## 2019-06-25 MED ORDER — AMLODIPINE BESYLATE 10 MG PO TABS: 10.0000 mg | ORAL_TABLET | Freq: Every day | ORAL | 0 refills | Status: DC

## 2019-06-26 ENCOUNTER — Telehealth: Payer: Self-pay | Admitting: Nurse Practitioner

## 2019-06-26 NOTE — Telephone Encounter (Signed)
Scheduled appt per 12/2 los.  Spoke with pt and they are aware of the appt date and time.

## 2019-06-27 ENCOUNTER — Encounter: Payer: Self-pay | Admitting: Nurse Practitioner

## 2019-07-01 ENCOUNTER — Telehealth: Payer: Self-pay | Admitting: Hematology

## 2019-07-01 NOTE — Telephone Encounter (Signed)
R/s apt per 12/8 sch message - pt is aware of new appt date and time

## 2019-07-02 ENCOUNTER — Inpatient Hospital Stay: Payer: Self-pay | Admitting: Hematology

## 2019-07-14 ENCOUNTER — Other Ambulatory Visit: Payer: Self-pay

## 2019-07-14 ENCOUNTER — Telehealth: Payer: Self-pay

## 2019-07-14 DIAGNOSIS — I10 Essential (primary) hypertension: Secondary | ICD-10-CM

## 2019-07-14 MED ORDER — LISINOPRIL-HYDROCHLOROTHIAZIDE 20-12.5 MG PO TABS: 2.0000 | ORAL_TABLET | Freq: Every day | ORAL | 0 refills | Status: DC

## 2019-07-14 MED ORDER — AMLODIPINE BESYLATE 10 MG PO TABS: 10.0000 mg | ORAL_TABLET | Freq: Every day | ORAL | 0 refills | Status: DC

## 2019-07-14 MED ORDER — ATORVASTATIN CALCIUM 10 MG PO TABS: 10.0000 mg | ORAL_TABLET | Freq: Every day | ORAL | 0 refills | Status: DC

## 2019-07-14 NOTE — Telephone Encounter (Signed)
Patient calls stating he has an appointment with new PCP but it is not until 08/15/2019.  He is requesting refills on Atorvastatin, Lisinopril and Amlodipine because he is almost out.  These were sent into [facility]	[place].  Patient was notified this was done.

## 2019-07-15 NOTE — Progress Notes (Addendum)
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 There is a solid mass in the right lobe of the liver which based on current measurements may have enlarged since the prior study from 2015. Images from the previous MR at that time cannot be retrieved. Given this circumstance, it may be prudent to correlate with pre and serial post-contrast MR or CT of the liver to further evaluate. There is underlying diffuse increase in liver echogenicity, a finding indicative of hepatic steatosis.   2. Multiple loops of fluid-filled bowel. Question a degree of ileus or enteritis.   3. Study otherwise unremarkable. Note that much of the common bile duct is obscured by gas.    06/11/2019 Imaging   CT AP W Contrast 06/11/19   IMPRESSION: 1. High-grade small bowel obstruction secondary to desmoplastic response in the central right abdominal mesentery centered on a 4 cm calcified irregular/spiculated soft tissue lesion. Abrupt small bowel transition zone is identified immediately lead adjacent to this mesenteric lesion and multiple adjacent bowel loops are tethered into this region with mesenteric edema/congestion. The loop of small bowel immediately proximal to the transition zone shows mild circumferential wall thickening but no pneumatosis. 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 2. Small lymph nodes in the abnormal right mesentery suggest additional metastatic involvement. 3. Heterogeneous liver parenchyma, likely secondary to geographic fatty deposition. There is a  focal 14 mm low-density lesion in the right liver. The patient had an MRI in the [facility] system on 08/09/2013 to evaluate a right liver lesion, but those images are not available. Follow-up MRI without and with contrast recommended to exclude metastatic disease. 4. Tiny sclerotic foci in the T12 vertebral body in both femoral heads are likely benign, but close attention on follow-up recommended. 5.  Aortic Atherosclerois (ICD10-170.0)      06/12/2019 Surgery    EXPLORATORY LAPAROTOMY WITH SMALL BOWEL RESECTION by Dr. 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 Interval resection small bowel and mesenteric mass with no evidence residual mesenteric or small bowel neuroendocrine tumor. 2. Large lesion in RIGHT hepatic lobe with intense radiotracer activity consistent with well differentiated neuroendocrine tumor metastasis. Smaller lesion in the anterior LEFT hepatic lobe is concerning for second hepatic metastasis. 3. Pulmonary nodule measuring 6 mm in LEFT upper lobe is not have radiotracer activity. Smaller RIGHT upper lobe nodule. Recommend close attention on follow-up.     INTERVAL HISTORY: Mr. Winkeler returns for f/u as scheduled following PET scan. He feels well. Appetite and energy level are back to normal. He had a recent follow up with Dr. Hassell Done,  next f/u 1/15. Denies pain, diarrhea, n/v. He has periodic flushing, worse in stressful  situations such as difficulty wtih IV access during with PET scan. Denies fever, chills, cough, chest pain, dyspnea, or leg swelling.    MEDICAL HISTORY:  Past Medical History:  Diagnosis Date  . Arthritis   . High cholesterol   . Hypertension     SURGICAL HISTORY: Past Surgical History:  Procedure Laterality Date  . HERNIA REPAIR    . LAPAROTOMY N/A 06/12/2019   Procedure: EXPLORATORY LAPAROTOMY WITH SMALL BOWEL RESECTION;  Surgeon: Johnathan Hausen, MD;  Location: [facility];  Service: General;  Laterality: N/A;  . SHOULDER SURGERY      I have reviewed the social history and family history with the patient and they are unchanged from previous note.  ALLERGIES:  has No Known Allergies.  MEDICATIONS:  Current Outpatient Medications  Medication Sig Dispense Refill  . amLODipine (NORVASC) 10 MG tablet Take 1 tablet (10 mg total) by mouth daily. 30 tablet 0  . atorvastatin (LIPITOR) 10 MG tablet Take 1 tablet (10 mg total) by mouth daily at 6 PM. 30 tablet 0  . lisinopril-hydrochlorothiazide (ZESTORETIC) 20-12.5 MG tablet Take 2 tablets by mouth daily. 60 tablet 0   No current facility-administered medications for this visit.    PHYSICAL EXAMINATION: ECOG PERFORMANCE STATUS: 0 - Asymptomatic  Vitals:   07/17/19 0829 07/17/19 0830  BP: (!) 165/116 (!) 163/102  Pulse: 94   Resp: 19   Temp: 97.8 F (36.6 C)   SpO2: 100%    Filed Weights   07/17/19 0829  Weight: 234 lb 9.6 oz (106.4 kg)    GENERAL:alert, no distress and comfortable SKIN: no rash  EYES: sclera clear NECK: without mass LUNGS: clear with normal breathing effort HEART: regular rate & rhythm, no lower extremity edema ABDOMEN: abdomen soft, non-tender and normal bowel sounds. No hepatomegaly or RUQ tenderness. Surgical incision healed  NEURO: alert & oriented x 3 with fluent speech, no focal motor/sensory deficits  LABORATORY DATA:  I have reviewed the data as listed CBC Latest Ref Rng & Units 06/15/2019  06/14/2019 06/13/2019  WBC 4.0 - 10.5 K/uL 4.5 9.6 11.5(H)  Hemoglobin 13.0 - 17.0 g/dL 14.7 15.1 14.6  Hematocrit 39.0 - 52.0 % 45.8 49.4 46.6  Platelets 150 - 400 K/uL 147(L) 280 342     CMP Latest Ref Rng & Units 06/15/2019 06/14/2019 06/13/2019  Glucose 70 - 99 mg/dL 86 101(H) 145(H)  BUN 6 - 20 mg/dL 5(L) 7 12  Creatinine 0.61 - 1.24 mg/dL 1.18 1.02 1.23  Sodium 135 - 145 mmol/L 141 141 137  Potassium 3.5 - 5.1 mmol/L 3.5 4.0 3.9  Chloride 98 - 111 mmol/L 104 106 103  CO2 22 - 32 mmol/L 22 24 24   Calcium 8.9 - 10.3 mg/dL 9.1 8.8(L) 8.3(L)  Total Protein 6.5 - 8.1 g/dL - - -  Total Bilirubin 0.3 - 1.2 mg/dL - - -  Alkaline Phos 38 - 126 U/L - - -  AST 15 - 41 U/L - - -  ALT 0 - 44 U/L - - -      RADIOGRAPHIC STUDIES: I have personally reviewed the radiological images as listed and agreed with the findings in the report. No results found.   ASSESSMENT & PLAN:  48 yo male with  1. 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  CT scan showed mesenteric mass and right hepatic lesion concerning for metastasis. He underwent resection on 11/19, pathology confirmed well differentiated neuroendocrine tumor, measuring 5 cm with metastasis to 14 of 32 LNs. Margins clear. It was completely resected based on path findings. Mitotic rate <2, however he does have certain high risk features such as PN and LV space invasion. -Mr. Muellner appears well today, he has completely recovered from surgery. Denies abdominal pain. 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 The right hepatic lesion is intensely hypermetabolic consistent with metastatic NET.  There is also a smaller less avid left hepatic lesion that is concerning for metastasis which was not seen on CT from 06/11/19.  -The patient was seen with Dr. Benay Spice who reviewed PET scan with the patient. Given the findings, a biopsy would confirm metastasis but is not being recommended at this point -We discussed findings are consistent with metastatic disease which is incurable at this stage, but treatable. There is no role or survival benefit of surgical resection -Given the usually indolent nature of carcinoid tumors in general, Dr. Benay Spice discussed reasonable options include observation vs starting first line treatment with somatostatin analogue such as lanreotide or octreotide to potentially slow tumor growth.  -The patient is interested in treatment. Dr. Benay Spice recommends lanreotide injection once monthly  -Chemo consent: We reviewed potential side effects including but not limited to headache, chest pain, HTN, constipation/diarrhea. He agrees to proceed   -he will return for lab including chromogranin A with f/u and first injection in 2-3 weeks after he gets insurance starting 07/25/19  -The above plan will be reviewed with Dr. Burr Medico upon her return and potentially discussed in our GI tumor board as well  2. HTN and recent AKI -likely related to being off meds for several months, these were refilled at his initial visit -has first appointment with new PCP later in 07/2019  -continue amlodipine and lisinopril-HCTZ  3. RA -previously on methotrexate and enbrel -not currently on therapy   PLAN: -PET reviewed -Proceed with getting insurance approval and starting lanreotide inj monthly in 2-3 weeks  -lab and f/u with first injection  No problem-specific Assessment & Plan notes found for this encounter.   No orders of the defined types were placed in this encounter.  All questions were answered. The patient knows to call the clinic with any problems, questions or concerns. No  barriers to learning was detected. I spent 20 counseling the patient face to face. The total time spent in the appointment was 30 minutes and more than 50% was on counseling and review of test results.     Alla Feeling, NP 07/18/19  This was a shared visit with Cira Rue.  We reviewed the dotatate images with him.  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  We discussed the prognosis and treatment options.  He understands no therapy will be curative.  We discussed observation versus treatment with a somatostatin analog.  The goal of treatment is to delay disease progression.  He would like to proceed with treatment.  He will begin lanreotide.  We reviewed potential toxicities associated with lanreotide.  We also discussed the option of Lutathera treatment at the time of disease progression.  Julieanne Manson, MD

## 2019-07-16 ENCOUNTER — Ambulatory Visit (HOSPITAL_COMMUNITY)
Admission: RE | Admit: 2019-07-16 | Discharge: 2019-07-16 | Disposition: A | Discharge status: Home / Self Care | Payer: Self-pay | Source: Ambulatory Visit | Attending: Nurse Practitioner | Admitting: Nurse Practitioner

## 2019-07-16 ENCOUNTER — Other Ambulatory Visit: Payer: Self-pay

## 2019-07-16 DIAGNOSIS — D3A098 Benign carcinoid tumors of other sites: Secondary | ICD-10-CM | POA: Insufficient documentation

## 2019-07-16 IMAGING — PT NM PET SKULL BASE TO THIGH
8 series · 25 of 25 positions shown · non-contrast
Comparison: CT 11 18 [P7]

CLINICAL DATA: Initial staging of neuroendocrine tumor of the small
bowel.

EXAM:
NUCLEAR MEDICINE PET SKULL BASE TO THIGH
TECHNIQUE: 3.83 mCi Ga 68 DOTATATE was injected intravenously. Full-ring PET
imaging was performed from the skull base to thigh after the
radiotracer. CT data was obtained and used for attenuation
correction and anatomic localization.

[Series 3: pet sk_thigh ac · axial · 5.0mm · 4.07mm/px · z∈[-1034,-106]mm · 4 of 233 slices shown]
[im 1/233]
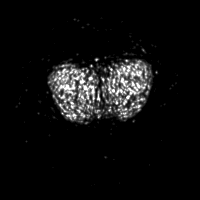
[im 78/233]
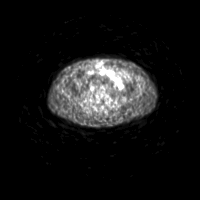
[im 155/233]
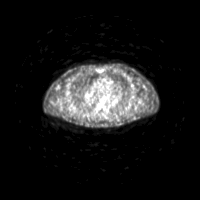
[im 233/233]
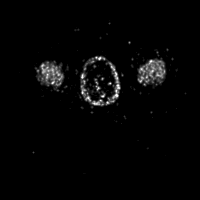

[Series 4: ct sk_thigh 5.0 hd_fov · axial · 5.0mm · 1.17mm/px · z∈[-1034,-106]mm · 5 of 233 slices shown]
[im 1/233]
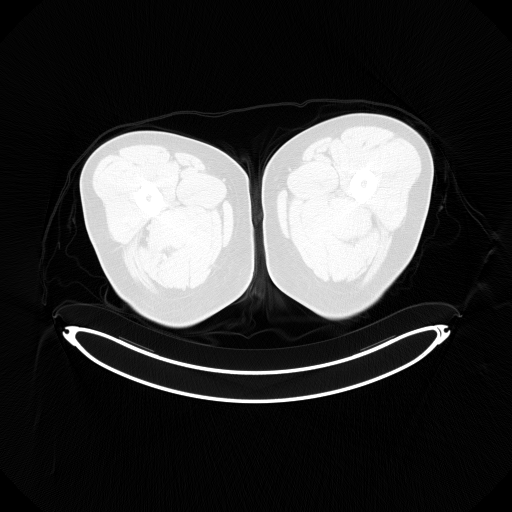
[im 59/233]
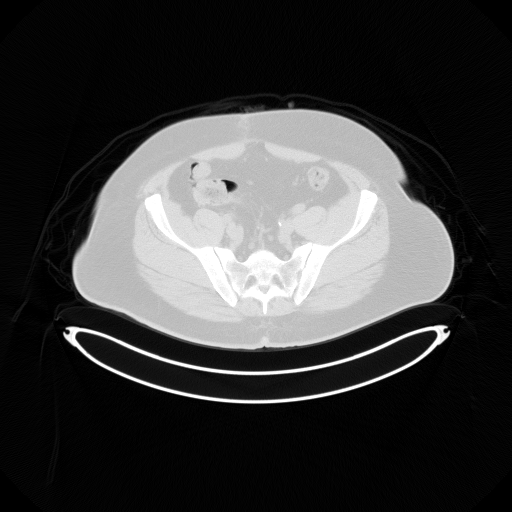
[im 117/233  brain]
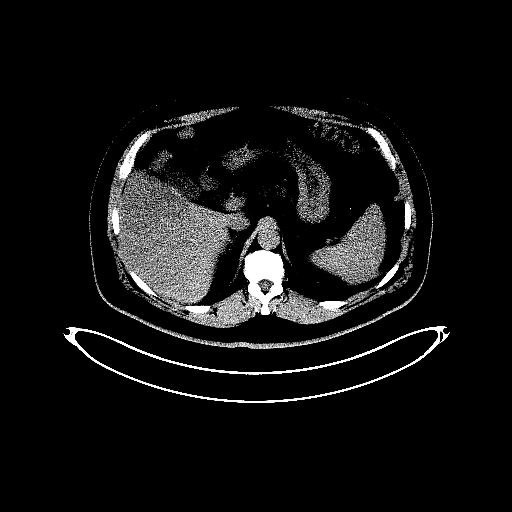
[im 175/233]
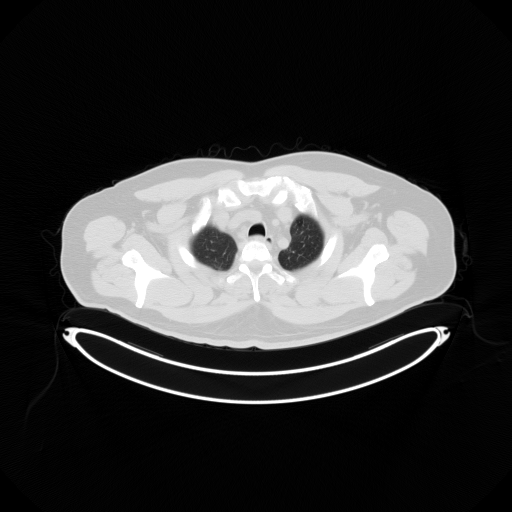
[im 233/233  brain]
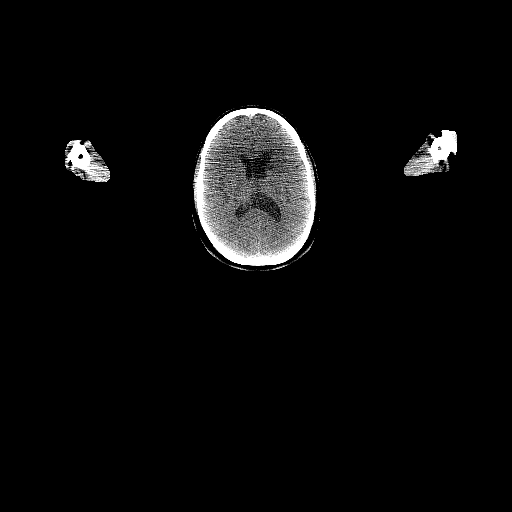

[Series 5: pet sk_thigh nac · axial · 5.0mm · 4.07mm/px · z∈[-1034,-106]mm · 5 of 233 slices shown]
[im 1/233]
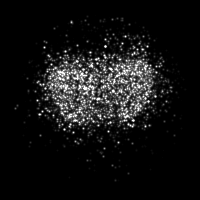
[im 59/233]
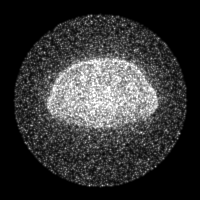
[im 117/233]
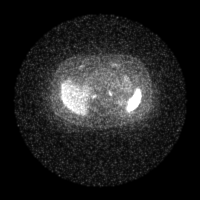
[im 175/233]
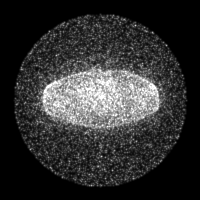
[im 233/233]
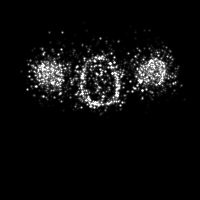

[Series 8: ct sk_thigh 5.0 b70f lung_bone · axial · 5.0mm · 0.68mm/px · 1 of 68 slices shown]
[im 1/68  bone]
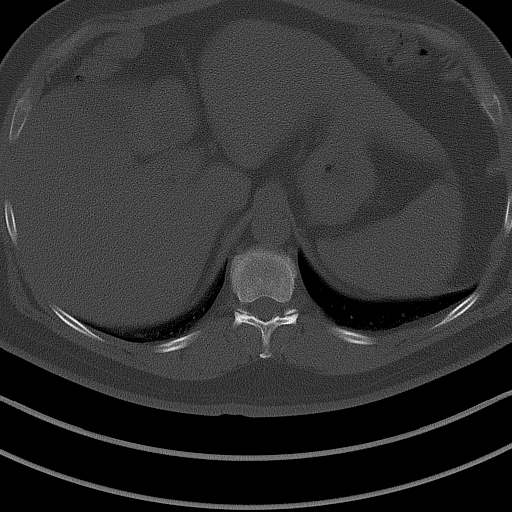

[Series 603: range-ct sk_thigh 5.0 hd_fov-cor-<alpha range> · 2 of 74 slices shown]
[im 1/74]
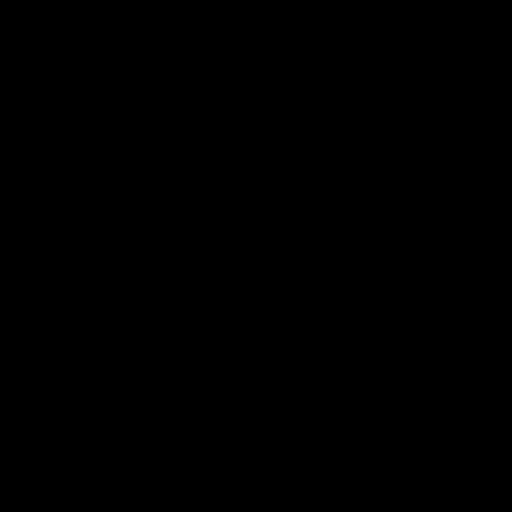
[im 74/74]
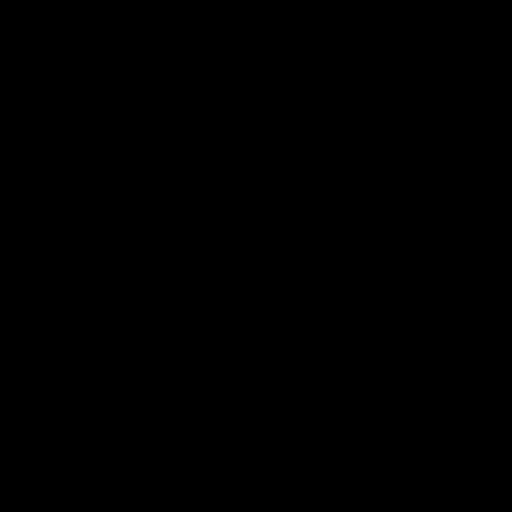

[Series 604: mip range 2 · coronal · 1.93mm/px · 1 of 32 slices shown]
[im 1/32]
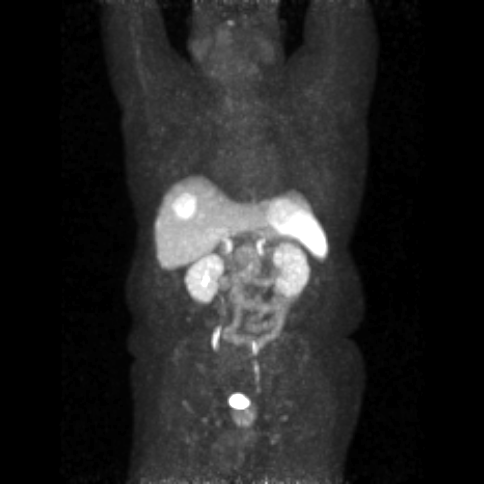

[Series 605: range-ct sk_thigh 5.0 hd_fov-tra-<alpha range> · 6 of 297 slices shown]
[im 1/297]
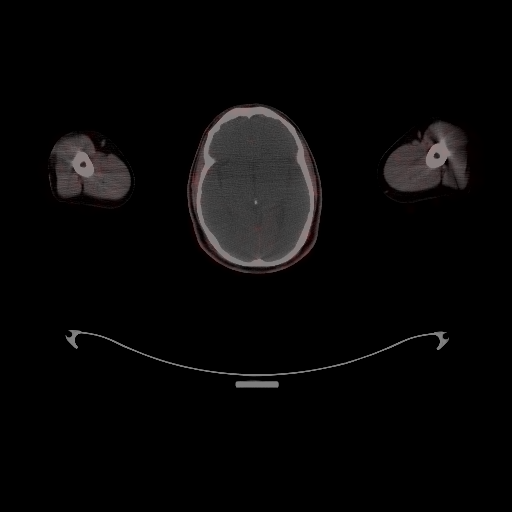
[im 60/297]
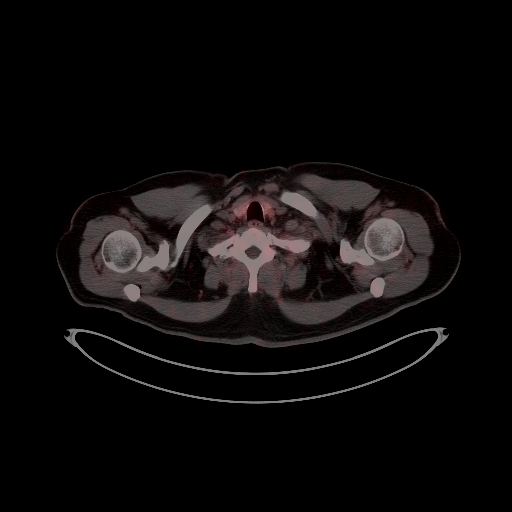
[im 119/297]
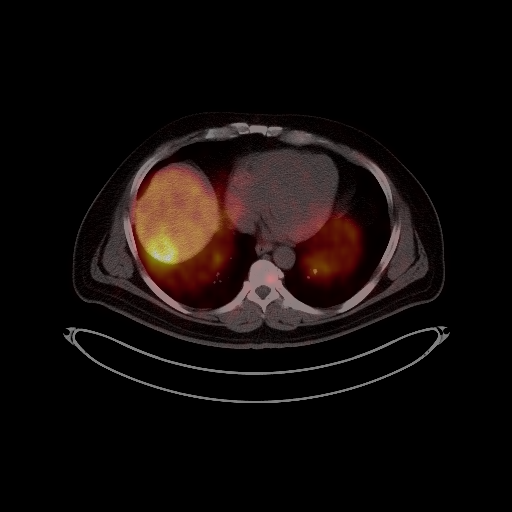
[im 178/297]
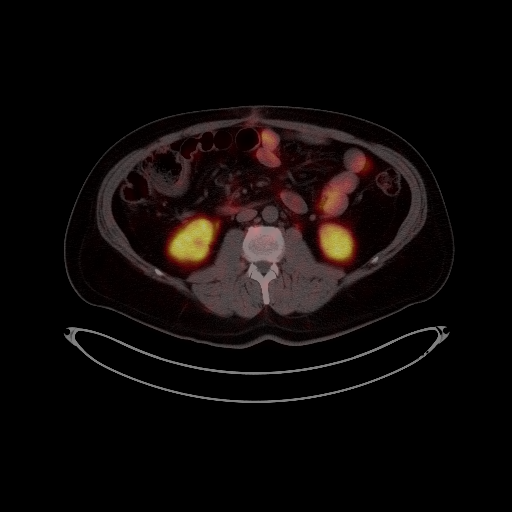
[im 237/297]
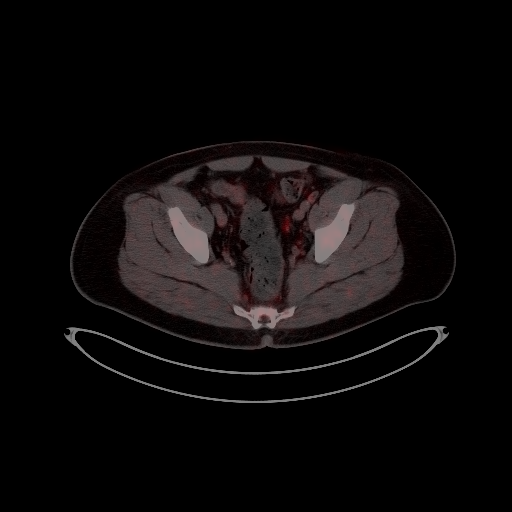
[im 297/297]
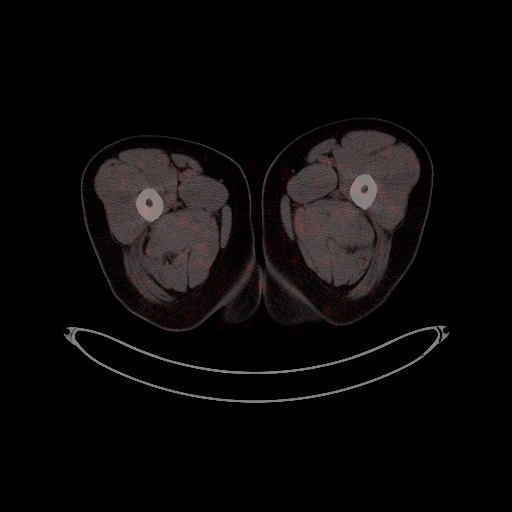

[Series 1105: results mm oncology reading · 1.2mm · 1.20mm/px · 1 of 3 slices shown]
[im 1/3]
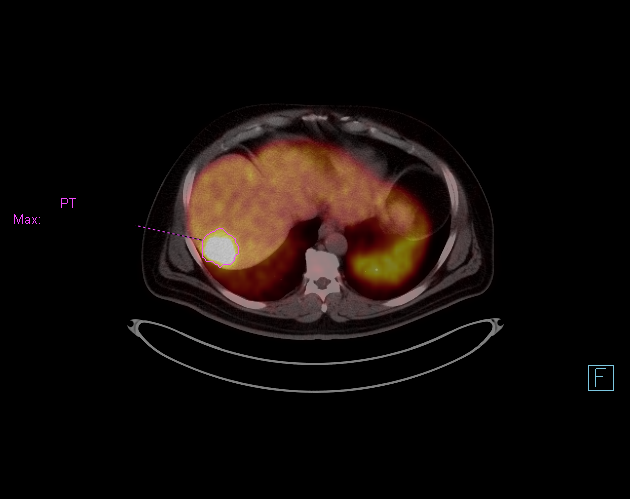

[25 of 25 positions shown; findings below may reference images not displayed]

FINDINGS: NECK

No radiotracer activity in neck lymph nodes.

Incidental CT findings: None

CHEST

No radiotracer accumulation within mediastinal or hilar lymph nodes.
Small pulmonary nodule in the RIGHT upper lobe measures 3 mm (image
81/4). Pulmonary nodule in the LEFT upper lobe measures 6 mm (image
6 7/40 without radiotracer activity.

Incidental CT finding:None

ABDOMEN/PELVIS

Within the RIGHT hepatic lobe, 4.7 cm well-circumscribed round
lesion adjacent to the liver capsule has intense radiotracer
activity SUV max equal 21.8. Second focus of radiotracer activity is
much smaller and less intense with SUV max equal 11.4 on image 104
of the fused data set.

There is misregistration between the PET data set and CT involving
the upper abdomen as noted by splenic misregistration. This is
likely due to breathing during the CT scan.

Interval resection of the central mesentery mass. There is an
enteric enteric anastomosis in the RIGHT abdomen. No abnormal
activity associated with the small bowel or mesentery. No peritoneal
nodules. No lymphadenopathy.

Physiologic activity noted in the liver, spleen, adrenal glands and
kidneys.

Incidental CT findings:None

SKELETON

No focal activity to suggest skeletal metastasis.

Incidental CT findings:None
IMPRESSION: 1. Interval resection small bowel and mesenteric mass with no
evidence residual mesenteric or small bowel neuroendocrine tumor.
2. Large lesion in RIGHT hepatic lobe with intense radiotracer
activity consistent with well differentiated neuroendocrine tumor
metastasis. Smaller lesion in the anterior LEFT hepatic lobe is
concerning for second hepatic metastasis.
3. Pulmonary nodule measuring 6 mm in LEFT upper lobe is not have
radiotracer activity. Smaller RIGHT upper lobe nodule. Recommend
close attention on follow-up.

## 2019-07-16 MED ORDER — GALLIUM GA 68 DOTATATE IV KIT
3.8300 | PACK | Freq: Once | INTRAVENOUS | Status: AC | PRN
  Administered 2019-07-16: 08:00:00 3.83 via INTRAVENOUS

## 2019-07-17 ENCOUNTER — Inpatient Hospital Stay (HOSPITAL_BASED_OUTPATIENT_CLINIC_OR_DEPARTMENT_OTHER): Payer: Self-pay | Admitting: Nurse Practitioner

## 2019-07-17 ENCOUNTER — Other Ambulatory Visit: Payer: Self-pay

## 2019-07-17 VITALS — BP 163/102 | HR 94 | Temp 97.8°F | Resp 19 | Ht 69.0 in | Wt 234.6 lb

## 2019-07-17 DIAGNOSIS — D3A098 Benign carcinoid tumors of other sites: Secondary | ICD-10-CM

## 2019-07-18 ENCOUNTER — Encounter: Payer: Self-pay | Admitting: Nurse Practitioner

## 2019-07-21 ENCOUNTER — Telehealth: Payer: Self-pay | Admitting: Nurse Practitioner

## 2019-07-21 ENCOUNTER — Ambulatory Visit (HOSPITAL_COMMUNITY): Payer: Self-pay

## 2019-07-21 NOTE — Telephone Encounter (Signed)
Scheduled appt per 12/24 los.  Spoke with pt and he is aware of the appt date and time

## 2019-07-23 ENCOUNTER — Ambulatory Visit: Payer: Self-pay | Admitting: Hematology

## 2019-07-29 ENCOUNTER — Telehealth: Payer: Self-pay

## 2019-07-29 ENCOUNTER — Other Ambulatory Visit: Payer: Self-pay | Admitting: Hematology

## 2019-07-29 DIAGNOSIS — D3A098 Benign carcinoid tumors of other sites: Secondary | ICD-10-CM

## 2019-07-29 NOTE — Telephone Encounter (Signed)
I spoke with Dwayne Sawyer and told him an MRI has been order per Dr. Marlowe Aschoff request.  Pt verbalized understanding

## 2019-07-29 NOTE — Telephone Encounter (Signed)
I spoke with Dwayne Sawyer this am.  I told him Dr. Burr Medico is referring him to Dr. Barry Dienes for surgical evaluation of his liver metastasis.  I told him Dr. Marlowe Aschoff office will be calling to set up an appointment.  Her verbalized understanding.

## 2019-07-30 ENCOUNTER — Other Ambulatory Visit: Payer: Self-pay | Admitting: Hematology

## 2019-07-30 DIAGNOSIS — D3A098 Benign carcinoid tumors of other sites: Secondary | ICD-10-CM

## 2019-07-31 NOTE — Progress Notes (Signed)
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 There is a solid mass in the right lobe of the liver which based on current measurements may have enlarged since the prior study from 2015. Images from the previous MR at that time cannot be retrieved. Given this circumstance, it may be prudent to correlate with pre and serial post-contrast MR or CT of the liver to further evaluate. There is underlying diffuse increase in liver echogenicity, a finding indicative of hepatic steatosis.   2. Multiple loops of fluid-filled bowel. Question a degree of ileus or enteritis.   3. Study otherwise unremarkable. Note that much of the common bile duct is obscured by gas.    06/11/2019 Imaging   CT AP W Contrast 06/11/19   IMPRESSION: 1. High-grade small bowel obstruction secondary to desmoplastic response in the central right abdominal mesentery centered on a 4 cm calcified irregular/spiculated soft tissue lesion. Abrupt small bowel transition zone is identified immediately lead adjacent to this mesenteric lesion and multiple adjacent bowel loops are tethered into this region with mesenteric edema/congestion. The loop of small bowel immediately proximal to the transition zone shows mild circumferential wall thickening but no pneumatosis. 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 2. Small lymph nodes in the abnormal right mesentery suggest additional metastatic involvement. 3. Heterogeneous liver parenchyma, likely secondary to geographic fatty deposition. There is a  focal 14 mm low-density lesion in the right liver. The patient had an MRI in the [facility] system on 08/09/2013 to evaluate a right liver lesion, but those images are not available. Follow-up MRI without and with contrast recommended to exclude metastatic disease. 4. Tiny sclerotic foci in the T12 vertebral body in both femoral heads are likely benign, but close attention on follow-up recommended. 5.  Aortic Atherosclerois (ICD10-170.0)      06/12/2019 Surgery    EXPLORATORY LAPAROTOMY WITH SMALL BOWEL RESECTION by Dr. 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 Interval resection small bowel and mesenteric mass with no evidence residual mesenteric or small bowel neuroendocrine tumor. 2. Large lesion in RIGHT hepatic lobe with intense radiotracer activity consistent with well differentiated neuroendocrine tumor metastasis. Smaller lesion in the anterior LEFT hepatic lobe is concerning for second hepatic metastasis. 3. Pulmonary nodule measuring 6 mm in LEFT upper lobe is not have radiotracer activity. Smaller RIGHT upper lobe nodule. Recommend close attention on follow-up.      CURRENT THERAPY:  Sandostatin injection monthly starting 08/01/19  INTERVAL HISTORY:  Dwayne Sawyer is here for a follow up. He presents to the clinic alone. 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 He denies any pain. He notes he has trend of 2 BM daily with normal morning stool and mid  day loose stool. He denies flushing of feeling hot that he can remember. He notes he has gained weight back after surgery and eating well. He notes he has MRI on 1/11 and will see Dr Barry Dienes that week to consult for liver surgery. He does not drink alcohol regularly.     REVIEW OF SYSTEMS:   Constitutional: Denies fevers, chills or abnormal weight loss Eyes: Denies blurriness of vision Ears, nose, mouth, throat, and face: Denies mucositis or sore throat Respiratory: Denies cough, dyspnea or wheezes Cardiovascular: Denies palpitation, chest discomfort or lower extremity swelling Gastrointestinal:  Denies nausea, heartburn or change in bowel habits Skin: Denies abnormal skin rashes Lymphatics: Denies new lymphadenopathy or easy bruising Neurological:Denies numbness, tingling or new weaknesses Behavioral/Psych: Mood is stable, no new changes  All other systems were reviewed with the patient and are negative.  MEDICAL HISTORY:  Past Medical History:  Diagnosis Date  . Arthritis   . High cholesterol   . Hypertension     SURGICAL HISTORY: Past Surgical History:  Procedure Laterality Date  . HERNIA REPAIR    . LAPAROTOMY N/A 06/12/2019   Procedure: EXPLORATORY LAPAROTOMY WITH SMALL BOWEL RESECTION;  Surgeon: Johnathan Hausen, MD;  Location: [facility];  Service: General;  Laterality: N/A;  . SHOULDER SURGERY      I have reviewed the social history and family history with the patient and they are unchanged from previous note.  ALLERGIES:  has No Known Allergies.  MEDICATIONS:  Current Outpatient Medications  Medication Sig Dispense Refill  . amLODipine (NORVASC) 10 MG tablet Take 1 tablet (10 mg total) by mouth daily. 30 tablet 0  . atorvastatin (LIPITOR) 10 MG tablet Take 1 tablet (10 mg total) by mouth daily at 6 PM. 30 tablet 0  . lisinopril-hydrochlorothiazide (ZESTORETIC) 20-12.5 MG tablet Take 2 tablets by mouth daily. 60 tablet 0   No current facility-administered medications  for this visit.   Facility-Administered Medications Ordered in Other Visits  Medication Dose Route Frequency Provider Last Rate Last Admin  . lanreotide acetate (SOMATULINE DEPOT) injection 120 mg  120 mg Subcutaneous Once Ladell Pier, MD        PHYSICAL EXAMINATION: ECOG PERFORMANCE STATUS: 0 - Asymptomatic  Vitals:   08/01/19 0938 08/01/19 0940  BP: (!) 169/98 (!) 151/96  Pulse: 93   Resp: 17   Temp: 98.2 F (36.8 C)   SpO2: 100%    Filed Weights   08/01/19 0938  Weight: 236 lb 12.8 oz (107.4 kg)    Due to COVID19 we will limit examination to appearance. Patient had no complaints.  GENERAL:alert, no distress and comfortable SKIN: skin color normal, no rashes or significant lesions EYES: normal, Conjunctiva are pink and non-injected, sclera clear  NEURO: alert & oriented x 3 with fluent speech   LABORATORY DATA:  I have reviewed the data as listed CBC Latest Ref Rng & Units 08/01/2019 06/15/2019 06/14/2019  WBC 4.0 - 10.5 K/uL 7.7 4.5 9.6  Hemoglobin 13.0 - 17.0 g/dL 14.7 14.7 15.1  Hematocrit 39.0 - 52.0 % 45.1 45.8 49.4  Platelets 150 - 400 K/uL 353 147(L) 280     CMP Latest Ref Rng & Units 08/01/2019 06/15/2019 06/14/2019  Glucose 70 - 99 mg/dL 91 86 101(H)  BUN 6 - 20 mg/dL 25(H) 5(L) 7  Creatinine 0.61 - 1.24 mg/dL 1.54(H) 1.18 1.02  Sodium 135 -  145 mmol/L 142 141 141  Potassium 3.5 - 5.1 mmol/L 3.8 3.5 4.0  Chloride 98 - 111 mmol/L 105 104 106  CO2 22 - 32 mmol/L 26 22 24   Calcium 8.9 - 10.3 mg/dL 9.2 9.1 8.8(L)  Total Protein 6.5 - 8.1 g/dL 8.1 - -  Total Bilirubin 0.3 - 1.2 mg/dL 0.6 - -  Alkaline Phos 38 - 126 U/L 105 - -  AST 15 - 41 U/L 13(L) - -  ALT 0 - 44 U/L 23 - -      RADIOGRAPHIC STUDIES: I have personally reviewed the radiological images as listed and agreed with the findings in the report. No results found.   ASSESSMENT & PLAN:  Dwayne Sawyer is a 49 y.o. male with    1. 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 Pathology confirmed well differentiated neuroendocrine tumor, with metastasis to 14 of 32 LNs. Margins clear.  -07/16/19 PET shows two right hepatic lesion which are intensely hypermetabolic consistent with metastatic NET. There is also a smaller less avid left hepatic lesion that is concerning for metastasis which was not seen on CT from 06/11/19. No other evidence of mets -Given the specificity of the dotatate PET findings, a biopsy is not absolutely needed. We discussed findings are consistent with metastatic disease.  -His case in GI tumor board earlier this week, to be limited liver metastasis, may consider surgery.  I have referred to IR.  Will obtain an abdominal MRI with Dr. Barry Dienes. -He will proceed with abdominal MRI on 1/11 before consulting with Dr Barry Dienes about possible liver resection. If he proceeds with surgery will proceed with surveillance only after surgery.  -Labs reviewed and adequate to proceed with Lanreotide injection today and continue monthly until surgery.  -He currently is doing clinically well with no pain, diarrhea or flushing symptoms. He has mildly loose stool daily. His Chromogranin A is still pending and he will turn in 24 hr urine next visit.  -F/u in 4 weeks    2. HTN  -He is still trying to schedule first appointment with new PCP  -continue amlodipine and lisinopril-HCTZ. I encouraged him to lower salt in diet.   3. RA -previously on methotrexate and enbrel -not currently on therapy   PLAN: -MRI abdomen on 08/04/19 and see Dr. Barry Dienes on 1/12 to discuss liver resection for mets  -Lab, f/u and injection in 4 weeks    No problem-specific Assessment & Plan notes found for this encounter.   No orders of the defined types were placed in this encounter.  All questions were answered. The patient knows to call the clinic with any problems,  questions or concerns. No barriers to learning was detected. The total time spent in the appointment was 30 minutes.     Truitt Merle, MD 08/01/2019   I, Joslyn Devon, am acting as scribe for Truitt Merle, MD.   I have reviewed the above documentation for accuracy and completeness, and I agree with the above.

## 2019-08-01 ENCOUNTER — Encounter: Payer: Self-pay | Admitting: Hematology

## 2019-08-01 ENCOUNTER — Inpatient Hospital Stay: Payer: Self-pay | Attending: Nurse Practitioner

## 2019-08-01 ENCOUNTER — Other Ambulatory Visit: Payer: Self-pay

## 2019-08-01 ENCOUNTER — Inpatient Hospital Stay: Payer: Self-pay

## 2019-08-01 ENCOUNTER — Inpatient Hospital Stay (HOSPITAL_BASED_OUTPATIENT_CLINIC_OR_DEPARTMENT_OTHER): Payer: Self-pay | Admitting: Hematology

## 2019-08-01 ENCOUNTER — Telehealth: Payer: Self-pay

## 2019-08-01 VITALS — BP 151/96 | HR 93 | Temp 98.2°F | Resp 17 | Ht 69.0 in | Wt 236.8 lb

## 2019-08-01 DIAGNOSIS — D3A098 Benign carcinoid tumors of other sites: Secondary | ICD-10-CM

## 2019-08-01 DIAGNOSIS — C7B8 Other secondary neuroendocrine tumors: Secondary | ICD-10-CM | POA: Insufficient documentation

## 2019-08-01 DIAGNOSIS — C7A8 Other malignant neuroendocrine tumors: Secondary | ICD-10-CM | POA: Insufficient documentation

## 2019-08-01 LAB — CMP (CANCER CENTER ONLY)
ALT: 23 U/L (ref 0–44)
AST: 13 U/L — ABNORMAL LOW (ref 15–41)
Albumin: 4.4 g/dL (ref 3.5–5.0)
Alkaline Phosphatase: 105 U/L (ref 38–126)
Anion gap: 11 (ref 5–15)
BUN: 25 mg/dL — ABNORMAL HIGH (ref 6–20)
CO2: 26 mmol/L (ref 22–32)
Calcium: 9.2 mg/dL (ref 8.9–10.3)
Chloride: 105 mmol/L (ref 98–111)
Creatinine: 1.54 mg/dL — ABNORMAL HIGH (ref 0.61–1.24)
GFR, Est AFR Am: 60 mL/min — ABNORMAL LOW (ref >60)
GFR, Estimated: 51 mL/min — ABNORMAL LOW (ref >60)
Glucose, Bld: 91 mg/dL (ref 70–99)
Potassium: 3.8 mmol/L (ref 3.5–5.1)
Sodium: 142 mmol/L (ref 135–145)
Total Bilirubin: 0.6 mg/dL (ref 0.3–1.2)
Total Protein: 8.1 g/dL (ref 6.5–8.1)

## 2019-08-01 LAB — CBC WITH DIFFERENTIAL (CANCER CENTER ONLY)
Abs Immature Granulocytes: 0.01 10*3/uL (ref 0.00–0.07)
Basophils Absolute: 0.1 10*3/uL (ref 0.0–0.1)
Basophils Relative: 1 %
Eosinophils Absolute: 0.2 10*3/uL (ref 0.0–0.5)
Eosinophils Relative: 3 %
HCT: 45.1 % (ref 39.0–52.0)
Hemoglobin: 14.7 g/dL (ref 13.0–17.0)
Immature Granulocytes: 0 %
Lymphocytes Relative: 17 %
Lymphs Abs: 1.3 10*3/uL (ref 0.7–4.0)
MCH: 27.6 pg (ref 26.0–34.0)
MCHC: 32.6 g/dL (ref 30.0–36.0)
MCV: 84.6 fL (ref 80.0–100.0)
Monocytes Absolute: 0.6 10*3/uL (ref 0.1–1.0)
Monocytes Relative: 8 %
Neutro Abs: 5.5 10*3/uL (ref 1.7–7.7)
Neutrophils Relative %: 71 %
Platelet Count: 353 10*3/uL (ref 150–400)
RBC: 5.33 MIL/uL (ref 4.22–5.81)
RDW: 14.6 % (ref 11.5–15.5)
WBC Count: 7.7 10*3/uL (ref 4.0–10.5)
nRBC: 0 % (ref 0.0–0.2)

## 2019-08-01 MED ORDER — LANREOTIDE ACETATE 120 MG/0.5ML ~~LOC~~ SOLN
120.0000 mg | Freq: Once | SUBCUTANEOUS | Status: AC
  Administered 2019-08-01: 120 mg via SUBCUTANEOUS
  Filled 2019-08-01: qty 120

## 2019-08-01 NOTE — Patient Instructions (Signed)
Lanreotide injection What is this medicine? LANREOTIDE (lan REE oh tide) is used to reduce blood levels of growth hormone in patients with a condition called acromegaly. It also works to slow or stop tumor growth in patients with neuroendocrine tumors and treat carcinoid syndrome. This medicine may be used for other purposes; ask your health care provider or pharmacist if you have questions. COMMON BRAND NAME(S): Somatuline Depot What should I tell my health care provider before I take this medicine? They need to know if you have any of these conditions:  diabetes  gallbladder disease  heart disease  kidney disease  liver disease  thyroid disease  an unusual or allergic reaction to lanreotide, other medicines, foods, dyes, or preservatives  pregnant or trying to get pregnant  breast-feeding How should I use this medicine? This medicine is for injection under the skin. It is given by a health care professional in a hospital or clinic setting. Contact your pediatrician or health care professional regarding the use of this medicine in children. Special care may be needed. Overdosage: If you think you have taken too much of this medicine contact a poison control center or emergency room at once. NOTE: This medicine is only for you. Do not share this medicine with others. What if I miss a dose? It is important not to miss your dose. Call your doctor or health care professional if you are unable to keep an appointment. What may interact with this medicine? This medicine may interact with the following medications:  bromocriptine  cyclosporine  certain medicines for blood pressure, heart disease, irregular heart beat  certain medicines for diabetes  quinidine  terfenadine This list may not describe all possible interactions. Give your health care provider a list of all the medicines, herbs, non-prescription drugs, or dietary supplements you use. Also tell them if you smoke,  drink alcohol, or use illegal drugs. Some items may interact with your medicine. What should I watch for while using this medicine? Tell your doctor or healthcare professional if your symptoms do not start to get better or if they get worse. Visit your doctor or health care professional for regular checks on your progress. Your condition will be monitored carefully while you are receiving this medicine. This medicine may increase blood sugar. Ask your healthcare provider if changes in diet or medicines are needed if you have diabetes. You may need blood work done while you are taking this medicine. Women should inform their doctor if they wish to become pregnant or think they might be pregnant. There is a potential for serious side effects to an unborn child. Talk to your health care professional or pharmacist for more information. Do not breast-feed an infant while taking this medicine or for 6 months after stopping it. This medicine has caused ovarian failure in some women. This medicine may interfere with the ability to have a child. Talk with your doctor or health care professional if you are concerned about your fertility. What side effects may I notice from receiving this medicine? Side effects that you should report to your doctor or health care professional as soon as possible:  allergic reactions like skin rash, itching or hives, swelling of the face, lips, or tongue  increased blood pressure  severe stomach pain  signs and symptoms of hgh blood sugar such as being more thirsty or hungry or having to urinate more than normal. You may also feel very tired or have blurry vision.  signs and symptoms of low blood   sugar such as feeling anxious; confusion; dizziness; increased hunger; unusually weak or tired; sweating; shakiness; cold; irritable; headache; blurred vision; fast heartbeat; loss of consciousness  unusually slow heartbeat Side effects that usually do not require medical  attention (report to your doctor or health care professional if they continue or are bothersome):  constipation  diarrhea  dizziness  headache  muscle pain  muscle spasms  nausea  pain, redness, or irritation at site where injected This list may not describe all possible side effects. Call your doctor for medical advice about side effects. You may report side effects to FDA at 1-800-FDA-1088. Where should I keep my medicine? This drug is given in a hospital or clinic and will not be stored at home. NOTE: This sheet is a summary. It may not cover all possible information. If you have questions about this medicine, talk to your doctor, pharmacist, or health care provider.  2020 Elsevier/Gold Standard (2018-04-18 09:13:08)  

## 2019-08-01 NOTE — Telephone Encounter (Signed)
I spoke with Dwayne Sawyer and let him know his creatinine was slightly elevated.  I told him Dr. Burr Medico wants him to increase his water intact.  He verbalized understanding.

## 2019-08-04 ENCOUNTER — Telehealth: Payer: Self-pay | Admitting: Hematology

## 2019-08-04 ENCOUNTER — Other Ambulatory Visit: Payer: Self-pay

## 2019-08-04 ENCOUNTER — Ambulatory Visit (HOSPITAL_COMMUNITY)
Admission: RE | Admit: 2019-08-04 | Discharge: 2019-08-04 | Disposition: A | Discharge status: Home / Self Care | Payer: Self-pay | Source: Ambulatory Visit | Attending: Hematology | Admitting: Hematology

## 2019-08-04 DIAGNOSIS — D3A098 Benign carcinoid tumors of other sites: Secondary | ICD-10-CM | POA: Insufficient documentation

## 2019-08-04 LAB — POCT I-STAT CREATININE: Creatinine, Ser: 1.6 mg/dL — ABNORMAL HIGH (ref 0.61–1.24)

## 2019-08-04 IMAGING — MR MR ABDOMEN WO/W CM
9 of 18 series · 20 of 48 positions shown · IV contrast (gadavist)
Comparison: Abdominal MRI [DATE].

CLINICAL DATA: 49-year-old male with history of carcinoid tumor of
the intestines. Possible liver metastatic disease noted on recent
PET-CT.

EXAM:
MRI ABDOMEN WITHOUT AND WITH CONTRAST
TECHNIQUE: Multiplanar multisequence MR imaging of the abdomen was performed
both before and after the administration of intravenous contrast.
CONTRAST:  10mL GADAVIST GADOBUTROL 1 MMOL/ML IV SOLN

[Series 3: T2 fat-sat · axial · 5.0mm · 0.90mm/px · 1 of 62 slices shown]
[im 1/62]
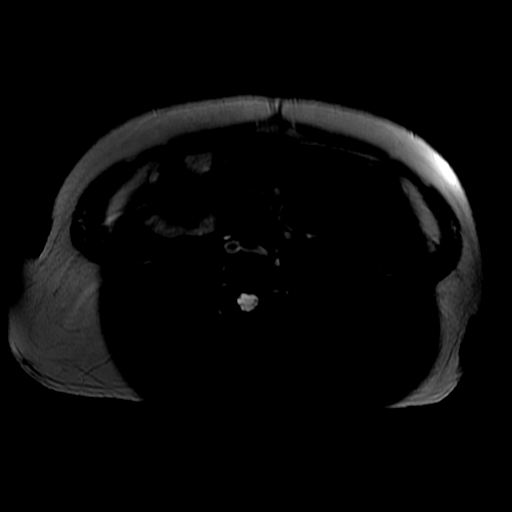

[Series 4: DWI b500 · axial · 6.0mm · 1.72mm/px · z∈[-73,+223]mm · 3 of 78 slices shown]
[im 1/78]
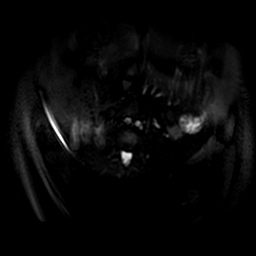
[im 39/78]
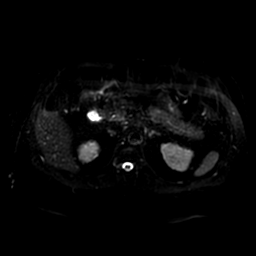
[im 78/78]
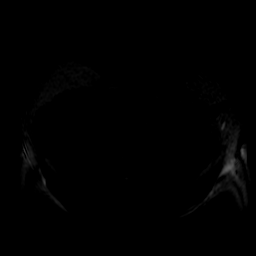

[Series 5: T2 · axial · 5.0mm · 0.90mm/px · z∈[-91,+214]mm · 2 of 62 slices shown (1 of 2)]
[im 1/62]
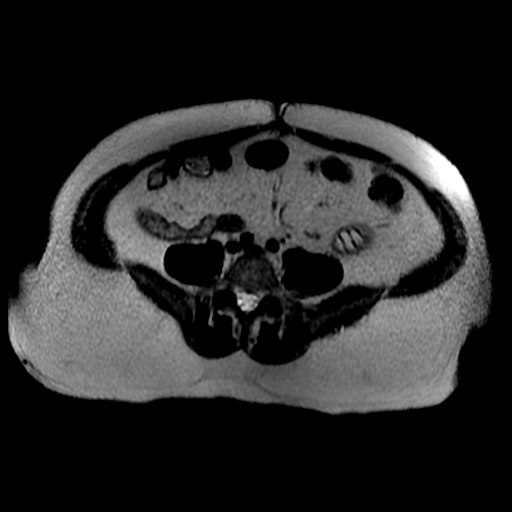
[im 62/62]
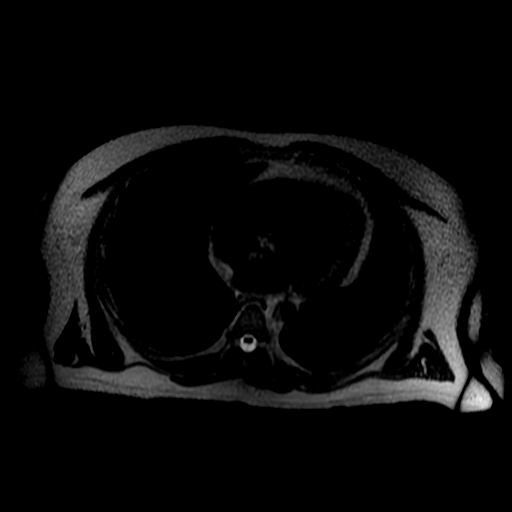

[Series 6: T2 · coronal · 5.0mm · 0.78mm/px · 2 of 50 slices shown (2 of 2)]
[im 1/50]
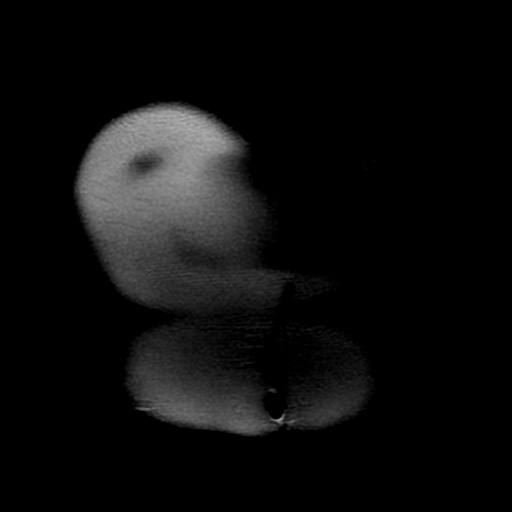
[im 50/50]
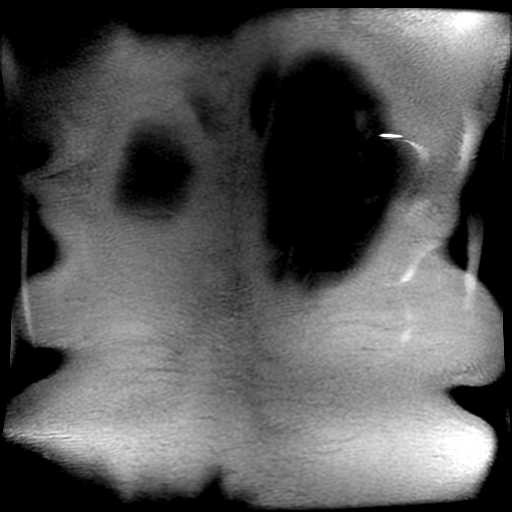

[Series 7: bSSFP · axial · 5.0mm · 0.90mm/px · z∈[-91,+214]mm · 2 of 62 slices shown]
[im 1/62]
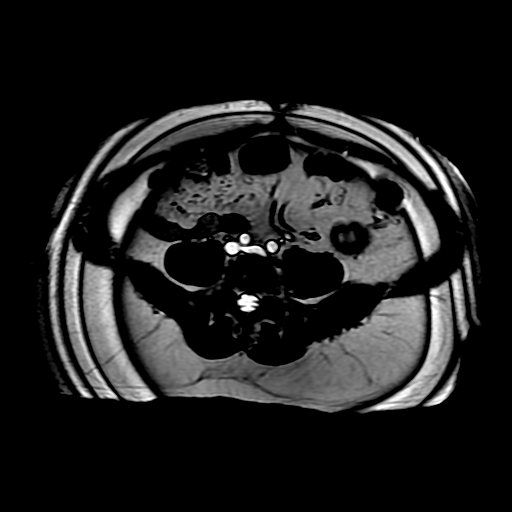
[im 62/62]
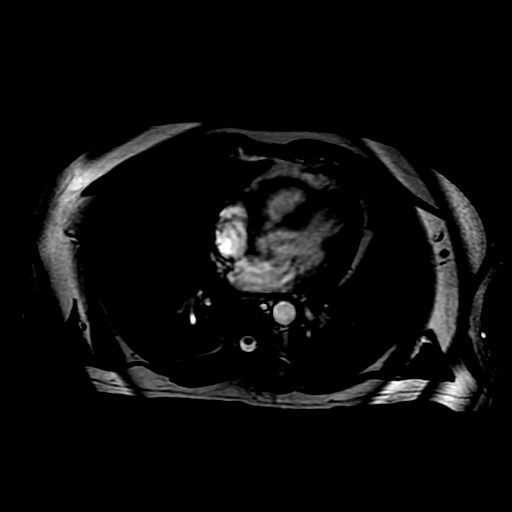

[Series 8: ax dualecho bh · axial · 5.0mm · 0.90mm/px · z∈[-91,+214]mm · 4 of 124 slices shown]
[im 1/124]
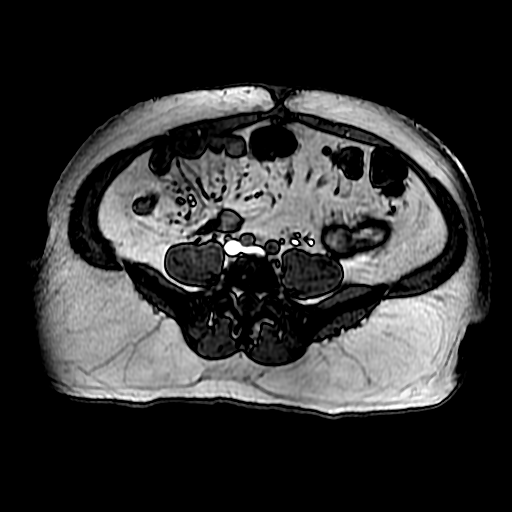
[im 42/124]
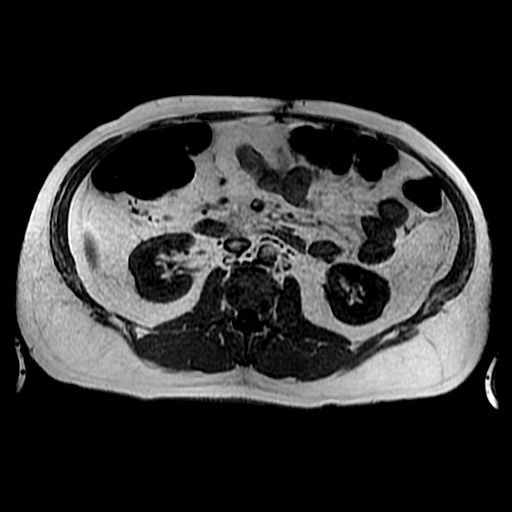
[im 83/124]
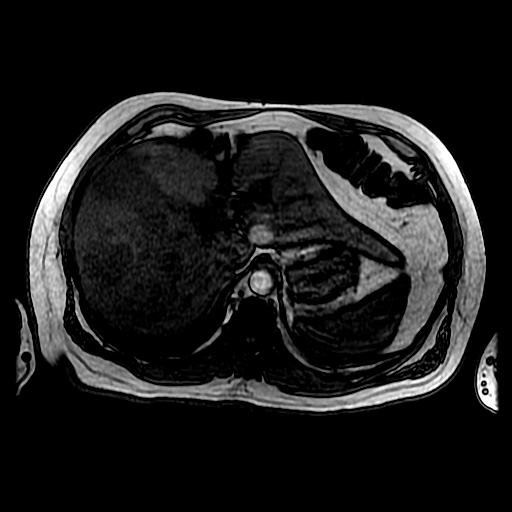
[im 124/124]
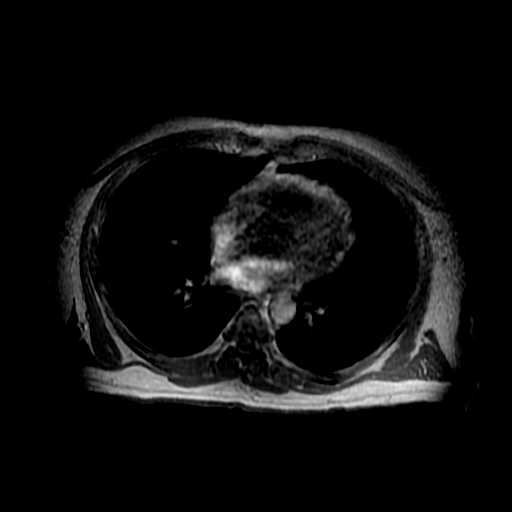

[Series 400: DWI · axial · 6.0mm · 1.72mm/px · 1 of 39 slices shown]
[im 1/39]
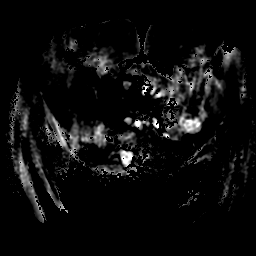

[Series 900: T1 dynamic · axial · 6.0mm · 0.86mm/px · z∈[-47,+214]mm · 3 of 88 slices shown (1 of 2)]
[im 1/88]
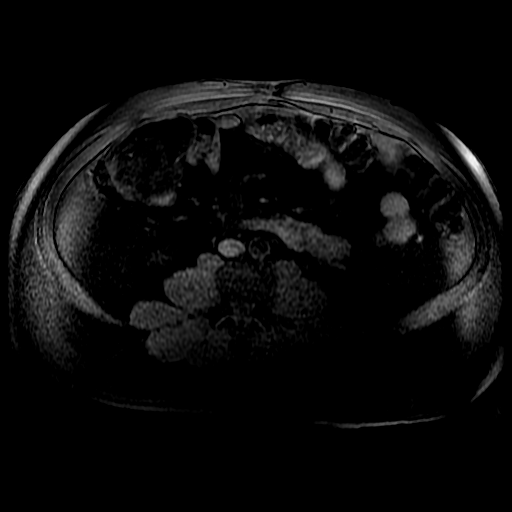
[im 44/88]
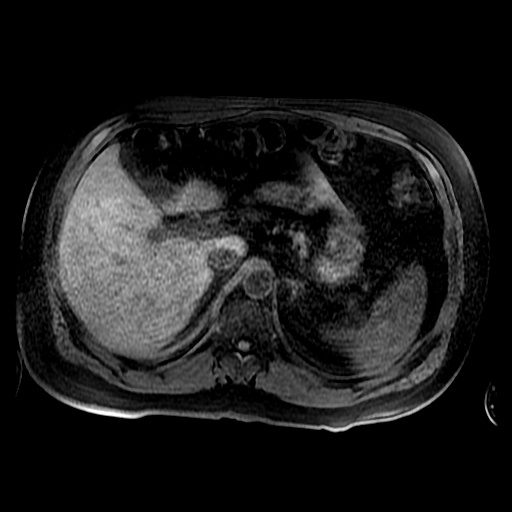
[im 88/88]
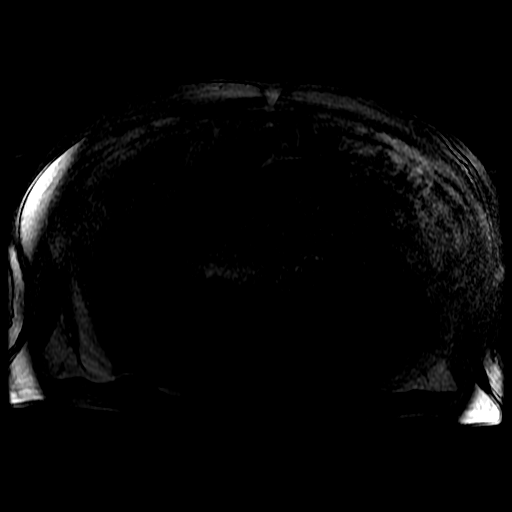

[Series 901: T1 dynamic · axial · 6.0mm · 0.86mm/px · z∈[-47,+82]mm · 2 of 88 slices shown (2 of 2)]
[im 1/88]
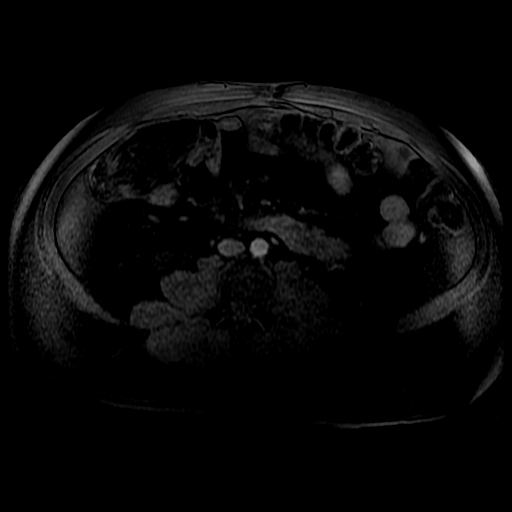
[im 44/88]
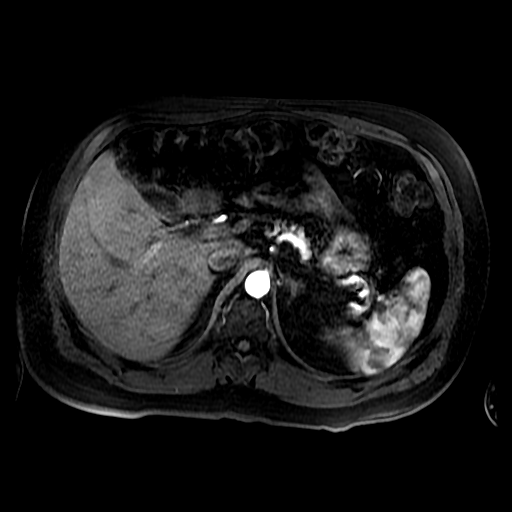

[20 of 48 positions shown; findings below may reference images not displayed]

FINDINGS: Lower chest: Unremarkable.

Hepatobiliary: Diffuse but heterogeneous loss of signal intensity on
out of phase dual echo images, indicative of a background of hepatic
steatosis. There is again a lesion in the right lobe of the liver
located between segments 7 and 8 (axial image 35 of series 905 and
coronal image 40 of series 10) which measures 4.4 x 3.9 x 4.3 cm
which correlates to the area of intense uptake on recent Dotatate
scan. This lesion is slightly T1 hypointense, slightly T2
hyperintense, restricts diffusion, and demonstrates low-level
arterial phase enhancement with heterogeneous persistent enhancement
on more delayed post gadolinium phases, as well as potential thin
capsule or pseudocapsule. A smaller 8 mm focus of hyperenhancement
on arterial phase imaging is appreciated in segment 4A (axial image
33 of series 901), corresponding to the area of increased uptake on
recent Dotatate scan; unfortunately, diffusion imaging is
compromised by image artifact in this region. A third hypervascular
lesion is noted in segment 4 B (axial image 47 of series 901)
measuring 11 mm, and this lesion also demonstrates diffusion
restriction. No intra or extrahepatic biliary ductal dilatation.
Gallbladder is unremarkable in appearance.

Pancreas: No pancreatic mass. No pancreatic ductal dilatation. No
pancreatic or peripancreatic fluid collections or inflammatory
changes.

Spleen:  Unremarkable.

Adrenals/Urinary Tract: Subcentimeter visualized portions T1
hypointense, T2 hyperintense, nonenhancing lesions in both kidneys,
compatible with tiny simple cysts. No hydroureteronephrosis in the
visualized portions of the abdomen. Bilateral adrenal glands are
normal in appearance.

Stomach/Bowel: Visualized portions are unremarkable.

Vascular/Lymphatic: No aneurysm identified in the visualized
abdominal vasculature. No lymphadenopathy noted in the abdomen.

Other: No significant volume of ascites noted in the visualized
portions of the peritoneal cavity.

Musculoskeletal: No aggressive appearing osseous lesions are noted
in the visualized portions of the skeleton.
IMPRESSION: 1. The previously noted lesion in the right lobe of the liver has
grown slightly compared to [TI]. In addition, today's study
demonstrates 2 smaller lesions with similar imaging characteristics.
Given the activity on the recent PET Dotatate scan, these lesions
are all compatible with small neuroendocrine metastatic lesions.
2. Hepatic steatosis.

## 2019-08-04 MED ORDER — GADOBUTROL 1 MMOL/ML IV SOLN
10.0000 mL | Freq: Once | INTRAVENOUS | Status: AC | PRN
  Administered 2019-08-04: 08:00:00 10 mL via INTRAVENOUS

## 2019-08-04 NOTE — Telephone Encounter (Signed)
Scheduled appt per 1/8 los.  Sent a message to HIM pool to get a calendar mailed out. 

## 2019-08-05 LAB — CHROMOGRANIN A: Chromogranin A (ng/mL): 82.9 ng/mL (ref 0.0–101.8)

## 2019-08-08 ENCOUNTER — Encounter: Payer: Self-pay | Admitting: Genetic Counselor

## 2019-08-08 ENCOUNTER — Telehealth: Payer: Self-pay

## 2019-08-08 NOTE — Telephone Encounter (Signed)
I spoke with Mr Nim and told him Dr. Burr Medico recommends genetic testing.  He is willing to speak with the genetic counselor.  Scheduling message sent.

## 2019-08-09 LAB — 5 HIAA, QUANTITATIVE, URINE, 24 HOUR
5-HIAA, Ur: 9.7 mg/L
5-HIAA,Quant.,24 Hr Urine: 11.2 mg/24 hr (ref 0.0–14.9)
Total Volume: 1150

## 2019-08-11 ENCOUNTER — Telehealth: Payer: Self-pay | Admitting: Licensed Clinical Social Worker

## 2019-08-11 NOTE — Telephone Encounter (Signed)
Scheduled per 1/15 sch msg. Called and spoke with pt, confirmed 1/21 appt

## 2019-08-14 ENCOUNTER — Inpatient Hospital Stay (HOSPITAL_BASED_OUTPATIENT_CLINIC_OR_DEPARTMENT_OTHER): Payer: Self-pay | Admitting: Licensed Clinical Social Worker

## 2019-08-14 ENCOUNTER — Encounter: Payer: Self-pay | Admitting: Licensed Clinical Social Worker

## 2019-08-14 DIAGNOSIS — D3A098 Benign carcinoid tumors of other sites: Secondary | ICD-10-CM

## 2019-08-14 DIAGNOSIS — Z809 Family history of malignant neoplasm, unspecified: Secondary | ICD-10-CM | POA: Insufficient documentation

## 2019-08-14 NOTE — Progress Notes (Signed)
REFERRING PROVIDER: Truitt Merle, MD [place],  [place] [address]  PRIMARY PROVIDER:  System, Pcp Not In  PRIMARY REASON FOR VISIT:  1. 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 Family history of carcinoid tumor     I connected with Dwayne Sawyer on 08/14/2019 at 9:00 AM EDT by MyChart video conference and verified that I am speaking with the correct person using two identifiers.    Patient location: home Provider location: home  HISTORY OF PRESENT ILLNESS:   Dwayne Sawyer, a 49 y.o. male, was seen for a [facility] cancer genetics consultation at the request of Dr. Burr Medico due to a personal and family history of carcinoid tumor.  Dwayne Sawyer presents to clinic today to discuss the possibility of a hereditary predisposition to cancer, genetic testing, and to further clarify his future cancer risks, as well as potential cancer risks for family members.   In 2020, at the age of 54, Dwayne Sawyer was diagnosed with carcinoid tumor of the intestine. This was treated with resection.   Patient has had colonoscopies with polyps, he believes <5 total. Patient has not had a parathyroid tumor, pituitary tumor, hyperparathyroidism. He has not had cafe-au-lait spots, neurofibromas, lipomas. He is unaware of family history of any of these.  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 There is a solid mass in the right lobe of the liver which based on current measurements may have enlarged since the prior study from 2015. Images from the previous MR at that time cannot be retrieved. Given this circumstance, it may be prudent to correlate with pre and serial post-contrast MR or CT of the liver to further evaluate. There is underlying diffuse increase in liver echogenicity, a finding indicative of hepatic steatosis.   2. Multiple loops of  fluid-filled bowel. Question a degree of ileus or enteritis.   3. Study otherwise unremarkable. Note that much of the common bile duct is obscured by gas.    06/11/2019 Imaging   CT AP W Contrast 06/11/19   IMPRESSION: 1. High-grade small bowel obstruction secondary to desmoplastic response in the central right abdominal mesentery centered on a 4 cm calcified irregular/spiculated soft tissue lesion. Abrupt small bowel transition zone is identified immediately lead adjacent to this mesenteric lesion and multiple adjacent bowel loops are tethered into this region with mesenteric edema/congestion. The loop of small bowel immediately proximal to the transition zone shows mild circumferential wall thickening but no pneumatosis. 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 2. Small lymph nodes in the abnormal right mesentery suggest additional metastatic involvement. 3. Heterogeneous liver parenchyma, likely secondary to geographic fatty deposition. There is a focal 14 mm low-density lesion in the right liver. The patient had an MRI in the [facility] on 08/09/2013 to evaluate a right liver lesion, but those images are not available. Follow-up MRI without and with contrast recommended to exclude metastatic disease. 4. Tiny sclerotic foci in the T12 vertebral body in both femoral heads are likely benign, but close attention on follow-up recommended. 5.  Aortic Atherosclerois (ICD10-170.0)      06/12/2019 Surgery    EXPLORATORY LAPAROTOMY WITH SMALL BOWEL RESECTION by Dr. 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 Interval resection small bowel and mesenteric mass with no evidence residual mesenteric or small bowel neuroendocrine tumor. 2. Large lesion in RIGHT hepatic lobe with intense radiotracer activity consistent with well differentiated neuroendocrine tumor metastasis. Smaller lesion in the anterior LEFT hepatic lobe is concerning for second hepatic metastasis. 3. Pulmonary nodule measuring 6 mm in LEFT upper lobe is not have radiotracer activity. Smaller RIGHT upper lobe nodule. Recommend close attention on follow-up.       Past Medical History:  Diagnosis Date  . Arthritis   . Family history of carcinoid tumor   . High cholesterol   . Hypertension     Past Surgical History:  Procedure Laterality Date  . HERNIA REPAIR    . LAPAROTOMY N/A 06/12/2019   Procedure: EXPLORATORY LAPAROTOMY WITH SMALL BOWEL RESECTION;  Surgeon: Johnathan Hausen, MD;  Location: [facility] ORS;  Service: General;  Laterality: N/A;  . SHOULDER SURGERY      Social History   Socioeconomic History  . Marital status: Married    Spouse name: Not on file  . Number of children: Not on file  . Years of education: Not on file  . Highest education level: Not on file  Occupational History  . Not on file  Tobacco Use  . Smoking status: Never Smoker  . Smokeless tobacco: Current User    Types: Chew  Substance and Sexual Activity  . Alcohol use: Yes    Comment: occ  . Drug use: Never  . Sexual activity: Not on file  Other Topics Concern  . Not on file  Social History Narrative  . Not on file   Social Determinants of Health   Financial Resource Strain:   . Difficulty of Paying Living Expenses: Not on file  Food Insecurity:   . Worried About Charity fundraiser in the Last  Year: Not on file  . Ran Out of Food in the Last Year: Not on file  Transportation Needs:   . Lack of Transportation (Medical): Not on file  . Lack of Transportation (Non-Medical): Not on file  Physical Activity:   . Days of Exercise per Week: Not on file  . Minutes of Exercise per Session: Not on file  Stress:   . Feeling of Stress : Not on file  Social Connections:   . Frequency of Communication with Friends and Family: Not on file  . Frequency of Social Gatherings with Friends and Family: Not on file  . Attends Religious Services: Not on file  . Active Member of Clubs or Organizations: Not on file  . Attends Archivist Meetings: Not on file  . Marital Status: Not on file     FAMILY HISTORY:  We obtained a detailed, 4-generation family history.  Significant diagnoses are listed below: Family History  Problem Relation Age of Onset  . Cancer Father        widespread carcinoid cancer   Dwayne Sawyer has 2 sons and 1 daughter, no history of cancer. He had one sister who died at 87, no history of cancer.  Dwayne Sawyer mother is living  at 73 and has never had cancer. Patient has about 3 maternal uncles and 2 maternal aunts, no cancers he is aware of. No known cancers in maternal cousins. Maternal grandparents both passed under age 48 due to heart issues.   Dwayne Sawyer father was diagnosed with a carcinoid tumor of the liver at 82 and it was metastatic. Patient had 2 paternal aunts, 1 paternal uncle. He believes one aunt had cancer but is unsure the type. No known cancers for paternal cousins, limited information on this side of the family as they do not live in the area. Paternal grandparents passed in their 27s.  Dwayne Sawyer is unaware of previous family history of genetic testing for hereditary cancer risks. Patient's maternal ancestors are of [place] descent, and paternal ancestors are of [place] descent. There is no reported Ashkenazi Jewish ancestry. There is no known  consanguinity.  GENETIC COUNSELING ASSESSMENT: Dwayne Sawyer is a 49 y.o. male with a personal and family history which is not particularly suggestive of a hereditary cancer syndrome and predisposition to cancer. We, therefore, discussed and recommended the following at today's visit.   DISCUSSION: We discussed that 5 - 10% of cancer in general is hereditary, and that most carcinoid tumors are not hereditary.  There are genes that can be associated with hereditary predisposition to carcinoid tumors.  These include MEN1 and NF1, we discussed these conditions briefly and he denies parathyroid tumors, hyperparathyroidism, pituitary tumor, neurofibromas, cafe-au-lait spots, and no family history of these that he is aware of. With MEN1, carcinoid tumors are typically bronchial or thymic. We discussed that his family history is not particularly suggestive of a hereditary cancer syndrome, but that it would not be unreasonable to pursue testing should he be interested. We discussed that testing is beneficial for several reasons including knowing how to follow individuals after completing their treatment, and understand if other family members could be at risk for cancer and allow them to undergo genetic testing.   We reviewed the characteristics, features and inheritance patterns of hereditary cancer syndromes. We also discussed genetic testing, including the appropriate family members to test, the process of testing, insurance coverage and turn-around-time for results. We discussed the implications of a negative, positive and/or variant of uncertain significant result. Dwayne Sawyer does not meet criteria for genetic testing. However, we offered Dwayne Sawyer the [facility] Multi-Cancer Panel if he is interested. Dwayne Sawyer is not interested in testing at this time, but reports he may consider this in the future. He is currently waiting for Medicaid and may consider testing once this is in place.   PLAN: Dwayne Sawyer did  not wish to pursue genetic testing at today's visit. We understand this decision and remain available to coordinate genetic testing at any time in the future. We, therefore, recommend Dwayne Sawyer continue to follow the cancer screening guidelines given by his primary healthcare provider.  Dwayne Sawyer questions were answered to his satisfaction today. Our contact information was provided should additional questions or concerns arise. Thank you for the referral and allowing [place] to share in the care of your patient.   Faith Rogue, MS, [facility] Genetic Counselor [email or web address] Phone: [phone]  The patient was seen for a total of 25 minutes in virtual genetic counseling.  Drs. Magrinat, Lindi Adie and/or Burr Medico were available for discussion regarding this case.   _______________________________________________________________________ For Office Staff:  Number of people involved in session: 1 Was an Intern/ student involved with case: no

## 2019-08-15 ENCOUNTER — Other Ambulatory Visit: Payer: Self-pay

## 2019-08-15 ENCOUNTER — Encounter: Payer: Self-pay | Admitting: Family Medicine

## 2019-08-15 ENCOUNTER — Ambulatory Visit: Payer: Self-pay | Attending: Family Medicine | Admitting: Family Medicine

## 2019-08-15 VITALS — BP 147/95 | HR 90 | Ht 69.0 in | Wt 239.0 lb

## 2019-08-15 DIAGNOSIS — I10 Essential (primary) hypertension: Secondary | ICD-10-CM

## 2019-08-15 DIAGNOSIS — E785 Hyperlipidemia, unspecified: Secondary | ICD-10-CM

## 2019-08-15 DIAGNOSIS — N183 Chronic kidney disease, stage 3 unspecified: Secondary | ICD-10-CM

## 2019-08-15 DIAGNOSIS — C7B02 Secondary carcinoid tumors of liver: Secondary | ICD-10-CM

## 2019-08-15 DIAGNOSIS — C7A019 Malignant carcinoid tumor of the small intestine, unspecified portion: Secondary | ICD-10-CM

## 2019-08-15 MED ORDER — VALSARTAN-HYDROCHLOROTHIAZIDE 160-25 MG PO TABS: 1.0000 | ORAL_TABLET | Freq: Every day | ORAL | 4 refills | Status: DC

## 2019-08-15 MED ORDER — ATORVASTATIN CALCIUM 10 MG PO TABS: 10.0000 mg | ORAL_TABLET | Freq: Every day | ORAL | 4 refills | Status: DC

## 2019-08-15 MED ORDER — AMLODIPINE BESYLATE 10 MG PO TABS: 10.0000 mg | ORAL_TABLET | Freq: Every day | ORAL | 4 refills | Status: DC

## 2019-08-15 NOTE — Patient Instructions (Signed)
Chronic Kidney Disease, Adult Chronic kidney disease (CKD) happens when the kidneys are damaged over a long period of time. The kidneys are two organs that help with:  Getting rid of waste and extra fluid from the blood.  Making hormones that maintain the amount of fluid in your tissues and blood vessels.  Making sure that the body has the right amount of fluids and chemicals. Most of the time, CKD does not go away, but it can usually be controlled. Steps must be taken to slow down the kidney damage or to stop it from getting worse. If this is not done, the kidneys may stop working. Follow these instructions at home: Medicines  Take over-the-counter and prescription medicines only as told by your doctor. You may need to change the amount of medicines you take.  Do not take any new medicines unless your doctor says it is okay. Many medicines can make your kidney damage worse.  Do not take any vitamin and supplements unless your doctor says it is okay. Many vitamins and supplements can make your kidney damage worse. General instructions  Follow a diet as told by your doctor. You may need to stay away from: ? Alcohol. ? Salty foods. ? Foods that are high in:  Potassium.  Calcium.  Protein.  Do not use any products that contain nicotine or tobacco, such as cigarettes and e-cigarettes. If you need help quitting, ask your doctor.  Keep track of your blood pressure at home. Tell your doctor about any changes.  If you have diabetes, keep track of your blood sugar as told by your doctor.  Try to stay at a healthy weight. If you need help, ask your doctor.  Exercise at least 30 minutes a day, 5 days a week.  Stay up-to-date with your shots (immunizations) as told by your doctor.  Keep all follow-up visits as told by your doctor. This is important. Contact a doctor if:  Your symptoms get worse.  You have new symptoms. Get help right away if:  You have symptoms of end-stage  kidney disease. These may include: ? Headaches. ? Numbness in your hands or feet. ? Easy bruising. ? Having hiccups often. ? Chest pain. ? Shortness of breath. ? Stopping of menstrual periods in women.  You have a fever.  You have very little pee (urine).  You have pain or bleeding when you pee. Summary  Chronic kidney disease (CKD) happens when the kidneys are damaged over a long period of time.  Most of the time, this condition does not go away, but it can usually be controlled. Steps must be taken to slow down the kidney damage or to stop it from getting worse.  Treatment may include a combination of medicines and lifestyle changes. This information is not intended to replace advice given to you by your health care provider. Make sure you discuss any questions you have with your health care provider. Document Revised: 06/22/2017 Document Reviewed: 08/14/2016 Elsevier Patient Education  2020 [employer].  Hypertension, Adult Hypertension is another name for high blood pressure. High blood pressure forces your heart to work harder to pump blood. This can cause problems over time. There are two numbers in a blood pressure reading. There is a top number (systolic) over a bottom number (diastolic). It is best to have a blood pressure that is below 120/80. Healthy choices can help lower your blood pressure, or you may need medicine to help lower it. What are the causes? The cause of  this condition is not known. Some conditions may be related to high blood pressure. What increases the risk?  Smoking.  Having type 2 diabetes mellitus, high cholesterol, or both.  Not getting enough exercise or physical activity.  Being overweight.  Having too much fat, sugar, calories, or salt (sodium) in your diet.  Drinking too much alcohol.  Having long-term (chronic) kidney disease.  Having a family history of high blood pressure.  Age. Risk increases with age.  Race. You may be at  higher risk if you are African American.  Gender. Men are at higher risk than women before age 46. After age 27, women are at higher risk than men.  Having obstructive sleep apnea.  Stress. What are the signs or symptoms?  High blood pressure may not cause symptoms. Very high blood pressure (hypertensive crisis) may cause: ? Headache. ? Feelings of worry or nervousness (anxiety). ? Shortness of breath. ? Nosebleed. ? A feeling of being sick to your stomach (nausea). ? Throwing up (vomiting). ? Changes in how you see. ? Very bad chest pain. ? Seizures. How is this treated?  This condition is treated by making healthy lifestyle changes, such as: ? Eating healthy foods. ? Exercising more. ? Drinking less alcohol.  Your health care provider may prescribe medicine if lifestyle changes are not enough to get your blood pressure under control, and if: ? Your top number is above 130. ? Your bottom number is above 80.  Your personal target blood pressure may vary. Follow these instructions at home: Eating and drinking   If told, follow the DASH eating plan. To follow this plan: ? Fill one half of your plate at each meal with fruits and vegetables. ? Fill one fourth of your plate at each meal with whole grains. Whole grains include whole-wheat pasta, brown rice, and whole-grain bread. ? Eat or drink low-fat dairy products, such as skim milk or low-fat yogurt. ? Fill one fourth of your plate at each meal with low-fat (lean) proteins. Low-fat proteins include fish, chicken without skin, eggs, beans, and tofu. ? Avoid fatty meat, cured and processed meat, or chicken with skin. ? Avoid pre-made or processed food.  Eat less than 1,500 mg of salt each day.  Do not drink alcohol if: ? Your doctor tells you not to drink. ? You are pregnant, may be pregnant, or are planning to become pregnant.  If you drink alcohol: ? Limit how much you use to:  0-1 drink a day for women.  0-2  drinks a day for men. ? Be aware of how much alcohol is in your drink. In the [place] one drink equals one 12 oz bottle of beer (355 mL), one 5 oz glass of wine (148 mL), or one 1 oz glass of hard liquor (44 mL). Lifestyle   Work with your doctor to stay at a healthy weight or to lose weight. Ask your doctor what the best weight is for you.  Get at least 30 minutes of exercise most days of the week. This may include walking, swimming, or biking.  Get at least 30 minutes of exercise that strengthens your muscles (resistance exercise) at least 3 days a week. This may include lifting weights or doing Pilates.  Do not use any products that contain nicotine or tobacco, such as cigarettes, e-cigarettes, and chewing tobacco. If you need help quitting, ask your doctor.  Check your blood pressure at home as told by your doctor.  Keep all follow-up visits as  told by your doctor. This is important. Medicines  Take over-the-counter and prescription medicines only as told by your doctor. Follow directions carefully.  Do not skip doses of blood pressure medicine. The medicine does not work as well if you skip doses. Skipping doses also puts you at risk for problems.  Ask your doctor about side effects or reactions to medicines that you should watch for. Contact a doctor if you:  Think you are having a reaction to the medicine you are taking.  Have headaches that keep coming back (recurring).  Feel dizzy.  Have swelling in your ankles.  Have trouble with your vision. Get help right away if you:  Get a very bad headache.  Start to feel mixed up (confused).  Feel weak or numb.  Feel faint.  Have very bad pain in your: ? Chest. ? Belly (abdomen).  Throw up more than once.  Have trouble breathing. Summary  Hypertension is another name for high blood pressure.  High blood pressure forces your heart to work harder to pump blood.  For most people, a normal blood pressure is less  than 120/80.  Making healthy choices can help lower blood pressure. If your blood pressure does not get lower with healthy choices, you may need to take medicine. This information is not intended to replace advice given to you by your health care provider. Make sure you discuss any questions you have with your health care provider. Document Revised: 03/20/2018 Document Reviewed: 03/20/2018 Elsevier Patient Education  2020 [employer].

## 2019-08-15 NOTE — Progress Notes (Signed)
Needs refills on all medications.

## 2019-08-15 NOTE — Progress Notes (Signed)
Subjective:  Patient ID: Dwayne Sawyer, male    DOB: 13-Jan-1971  Age: 49 y.o. MRN: [id number]  CC: New Patient (Initial Visit)   HPI Dwayne Sawyer, 49 yo male new to the practice, who has history of hospital admission from 06/11/2019-06/15/2019 due to ED presentation with abdominal pain and was found to have high -grade small bowel obstruction, hypertensive urgency and acute kidney injury. He underwent laproscopic surgery with small bowel resection revealing carcinoid tumor.  He reports that overall he feels well at this time.  He continues to follow-up with oncology and had surgery follow-up on 08/05/2019 with [facility] surgery.  He reports that he currently takes atorvastatin 10 mg to control his cholesterol and lisinopril-hydrochlorothiazide 20-12.5 mg twice daily and amlodipine 10 mg once daily for control of his blood pressure.  He denies any current issues with headaches or dizziness related to his blood pressure.  He denies any current postsurgical abdominal pain.   He denies current nausea/vomiting/diarrhea or constipation.  He does have some mild fatigue.  He reports a history of rheumatoid arthritis and in the past was on methotrexate and Enbrel but is not currently on any medication for the treatment of rheumatoid arthritis at this time.  Past Medical History:  Diagnosis Date  . Arthritis   . Family history of carcinoid tumor   . High cholesterol   . Hypertension     Past Surgical History:  Procedure Laterality Date  . HERNIA REPAIR    . LAPAROTOMY N/A 06/12/2019   Procedure: EXPLORATORY LAPAROTOMY WITH SMALL BOWEL RESECTION;  Surgeon: Johnathan Hausen, MD;  Location: [facility];  Service: General;  Laterality: N/A;  . SHOULDER SURGERY      Family History  Problem Relation Age of Onset  . Cancer Father        widespread carcinoid cancer    Social History   Tobacco Use  . Smoking status: Never Smoker  . Smokeless tobacco: Current User    Types: Chew  Substance Use  Topics  . Alcohol use: Yes    Comment: occ    ROS Review of Systems  Constitutional: Positive for fatigue (Mild, improving). Negative for chills and fever.  HENT: Negative for sore throat and trouble swallowing.   Eyes: Negative for photophobia and visual disturbance.  Respiratory: Negative for cough and shortness of breath.   Cardiovascular: Negative for chest pain and palpitations.  Gastrointestinal: Negative for abdominal pain, blood in stool, constipation, diarrhea and nausea.  Endocrine: Negative for cold intolerance, heat intolerance, polydipsia, polyphagia and polyuria.  Genitourinary: Negative for dysuria and frequency.  Musculoskeletal: Negative for arthralgias and back pain.  Skin: Negative for rash and wound.  Neurological: Negative for dizziness and headaches.  Hematological: Negative for adenopathy. Does not bruise/bleed easily.    Objective:   Today's Vitals: BP (!) 147/95   Pulse 90   Ht '5\' 9"'  (1.753 m)   Wt 239 lb (108.4 kg)   SpO2 97%   BMI 35.29 kg/m   Physical Exam Vitals and nursing note reviewed.  Constitutional:      General: He is not in acute distress.    Appearance: Normal appearance. He is obese. He is not ill-appearing.     Comments: Well-nourished well-developed larger framed male in no acute distress wearing facial mask as per office COVID-19 protocol  Eyes:     General: No scleral icterus.    Extraocular Movements: Extraocular movements intact.     Conjunctiva/sclera: Conjunctivae normal.  Neck:  Vascular: No carotid bruit.  Cardiovascular:     Rate and Rhythm: Normal rate and regular rhythm.  Pulmonary:     Effort: Pulmonary effort is normal.     Breath sounds: Normal breath sounds.  Abdominal:     Palpations: Abdomen is soft.     Tenderness: There is no abdominal tenderness. There is no right CVA tenderness, left CVA tenderness, guarding or rebound.  Musculoskeletal:        General: No tenderness or deformity.     Cervical back:  Normal range of motion and neck supple. No tenderness.     Right lower leg: No edema.     Left lower leg: No edema.  Lymphadenopathy:     Cervical: No cervical adenopathy.  Skin:    General: Skin is warm and dry.  Neurological:     General: No focal deficit present.     Mental Status: He is alert and oriented to person, place, and time.  Psychiatric:        Mood and Affect: Mood normal.        Behavior: Behavior normal.     Assessment & Plan:  1. Essential hypertension Blood pressure is near goal of less than 140/90.  Patient is encouraged to follow a low sodium diet as well as continue use of amlodipine and his lisinopril hydrochlorothiazide will be changed to valsartan-HCTZ 160/25 to help gain better control of his blood pressure.  He will also be referred to follow-up with clinical pharmacist for blood pressure recheck and will need repeat BMP in a few weeks.  Educational material on hypertension given as part of after visit summary. - atorvastatin (LIPITOR) 10 MG tablet; Take 1 tablet (10 mg total) by mouth daily at 6 PM.  Dispense: 30 tablet; Refill: 4 - amLODipine (NORVASC) 10 MG tablet; Take 1 tablet (10 mg total) by mouth daily.  Dispense: 30 tablet; Refill: 4 - valsartan-hydrochlorothiazide (DIOVAN-HCT) 160-25 MG tablet; Take 1 tablet by mouth daily.  Dispense: 30 tablet; Refill: 4 - Amb Referral to Clinical Pharmacist  2. Stage 3 chronic kidney disease, unspecified whether stage 3a or 3b CKD On most recent blood work in chart, done on 08/01/2019 at the cancer center, patient with creatinine of 1.54 with EGFR 51.  Patient is encouraged to remain well-hydrated and avoid the use of nonsteroidal anti-inflammatory medication.  His blood pressure medications are also being adjusted to try and obtain better control of his blood pressure.  He has been referred to follow-up with the clinical pharmacist for blood pressure recheck in a few weeks and at that time, he will likely need repeat BMP  as well in follow-up of his creatinine/chronic kidney disease.. - Amb Referral to Clinical Pharmacist  3. Malignant carcinoid tumor of small intestine, unspecified location [facility]) status post resection.  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 Patient has copy of recent visit note from 08/05/2019 with [facility] surgery.  He is status post small bowel resection and removal of mesenteric mass.  Per his note, he does have metastasis of carcinoid tumor to the liver.  He denies any current abdominal pain, nausea or diarrhea today's visit.  He continues to follow-up with oncology for continued treatment and per his surgery note, he will have repeat imaging in 3 to 4 months that is being coordinated between surgery and oncology.   4. Hyperlipidemia, unspecified hyperlipidemia type He is currently on atorvastatin for hyperlipidemia. Liver enzymes were normal on his comprehensive metabolic panel done 6/0/1093.  We will  have patient schedule future lab appointment at which time fasting lipid panel and comprehensive metabolic panel can be done in follow-up of his hyperlipidemia, liver enzymes and creatinine. - atorvastatin (LIPITOR) 10 MG tablet; Take 1 tablet (10 mg total) by mouth daily at 6 PM.  Dispense: 30 tablet; Refill: 4   Outpatient Encounter Medications as of 08/15/2019  Medication Sig  . amLODipine (NORVASC) 10 MG tablet Take 1 tablet (10 mg total) by mouth daily.  Marland Kitchen atorvastatin (LIPITOR) 10 MG tablet Take 1 tablet (10 mg total) by mouth daily at 6 PM.  . lisinopril-hydrochlorothiazide (ZESTORETIC) 20-12.5 MG tablet Take 2 tablets by mouth daily.   No facility-administered encounter medications on file as of 08/15/2019.    An After Visit Summary was printed and given to the patient.   Follow-up: Return in about 8 weeks (around 10/10/2019) for HTN/CKD- 2 weeks with Luke/CPP.    Delonna Ney MD  Unfortunately, patient's medical information was not initially linked to today's visit  making it appear as if patient was new to the healthcare system and did not have any prior records in epic. His actual chart information was not available until after patient's visit. Likely patient did have a note with him from [place] surgery from of follow-up visit on 2019-08-05 which allowed today's note to be synced with patient's chart/past medical records.

## 2019-08-21 ENCOUNTER — Telehealth: Payer: Self-pay

## 2019-08-21 NOTE — Telephone Encounter (Signed)
-----   Message from Alla Feeling, NP sent at 08/19/2019  4:25 PM EST ----- Please let him know urine test and chromogranin A are normal. We will see him at his scheduled visit next week.  Thanks, Regan Rakers

## 2019-08-21 NOTE — Telephone Encounter (Signed)
Spoke with patient regarding lab results, per Cira Rue NP urine test and chromogranin A are normal.  Keep his next schedule appointment.  He verbalized an understanding.

## 2019-08-25 ENCOUNTER — Encounter: Payer: Self-pay | Admitting: Pharmacy Technician

## 2019-08-25 NOTE — Progress Notes (Signed)
[facility]   [phone] [phone]   Clinic Follow up Note   Patient Care Team: Antony Blackbird, MD as PCP - General (Family Medicine)  Date of Service:  08/29/2019  CHIEF COMPLAINT:  F/u neuroendocrine tumor  SUMMARY OF ONCOLOGIC HISTORY: Oncology History  Carcinoid tumor of intestine producing obstruction--resected Nov 2020  06/11/2019 Imaging   US Abdomen 06/11/19  IMPRESSION: 1. There is a solid mass in the right lobe of the liver which based on current measurements may have enlarged since the prior study from 2015. Images from the previous MR at that time cannot be retrieved. Given this circumstance, it may be prudent to correlate with pre and serial post-contrast MR or CT of the liver to further evaluate. There is underlying diffuse increase in liver echogenicity, a finding indicative of hepatic steatosis.   2. Multiple loops of fluid-filled bowel. Question a degree of ileus or enteritis.   3. Study otherwise unremarkable. Note that much of the common bile duct is obscured by gas.    06/11/2019 Imaging   CT AP W Contrast 06/11/19   IMPRESSION: 1. High-grade small bowel obstruction secondary to desmoplastic response in the central right abdominal mesentery centered on a 4 cm calcified irregular/spiculated soft tissue lesion. Abrupt small bowel transition zone is identified immediately lead adjacent to this mesenteric lesion and multiple adjacent bowel loops are tethered into this region with mesenteric edema/congestion. The loop of small bowel immediately proximal to the transition zone shows mild circumferential wall thickening but no pneumatosis. 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 2. Small lymph nodes in the abnormal right mesentery suggest additional metastatic involvement. 3. Heterogeneous liver parenchyma, likely secondary to geographic fatty  deposition. There is a focal 14 mm low-density lesion in the right liver. The patient had an MRI in the [facility] system on 08/09/2013 to evaluate a right liver lesion, but those images are not available. Follow-up MRI without and with contrast recommended to exclude metastatic disease. 4. Tiny sclerotic foci in the T12 vertebral body in both femoral heads are likely benign, but close attention on follow-up recommended. 5.  Aortic Atherosclerois (ICD10-170.0)      06/12/2019 Surgery    EXPLORATORY LAPAROTOMY WITH SMALL BOWEL RESECTION by Dr. 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 Interval resection small bowel and mesenteric mass with no evidence residual mesenteric or small bowel neuroendocrine tumor. 2. Large lesion in RIGHT hepatic lobe with intense radiotracer activity consistent with well differentiated neuroendocrine tumor metastasis. Smaller lesion in the anterior LEFT hepatic lobe is concerning for second hepatic metastasis. 3. Pulmonary nodule measuring 6 mm in LEFT upper lobe is not have radiotracer activity. Smaller RIGHT upper lobe nodule. Recommend close attention on follow-up.   08/01/2019 -  Chemotherapy   Lanreotide injection monthly starting 08/01/19    08/04/2019 Imaging   MRI abdomen  IMPRESSION: 1. The previously noted lesion in the right lobe of the  liver has grown slightly compared to 2015. In addition, today's study  demonstrates 2 smaller lesions with similar imaging characteristics. Given the activity on the recent PET Dotatate scan, these lesions are all compatible with small neuroendocrine metastatic lesions. 2. Hepatic steatosis.      CURRENT THERAPY:  Lanreotide injection monthly starting 08/01/19  INTERVAL HISTORY:  Dwayne Sawyer is here for a follow up and treatment. He presents to the clinic alone. He notes he is doing well. He notes he has gained weight lately. He notes he saw Dr. Barry Dienes last month and was told he would not contribute to his survival and she would not do surgery at this time. He notes he had MRI in 2015 due to kidney stones at that time. He notes one of his physicians then the liver mass seen was of no concern. He notes he is no longer seeing that practice.  He notes he has loose stool for a few days but that resolved. He notes his stools are larger now, but not hard. He notes he will have BM up to 5 times a day.   He notes his father had carcinoid tumor and was treated at [facility]. His father did not tolerate well and was in ICU for 3 months, but was able to live for another 6 years.    REVIEW OF SYSTEMS:   Constitutional: Denies fevers, chills or abnormal weight loss Eyes: Denies blurriness of vision Ears, nose, mouth, throat, and face: Denies mucositis or sore throat Respiratory: Denies cough, dyspnea or wheezes Cardiovascular: Denies palpitation, chest discomfort or lower extremity swelling Gastrointestinal:  Denies nausea, heartburn (+) Large stool output up Skin: Denies abnormal skin rashes Lymphatics: Denies new lymphadenopathy or easy bruising Neurological:Denies numbness, tingling or new weaknesses Behavioral/Psych: Mood is stable, no new changes  All other systems were reviewed with the patient and are negative.  MEDICAL HISTORY:  Past Medical History:  Diagnosis Date  . Arthritis   . Family history of carcinoid tumor   . High  cholesterol   . Hypertension     SURGICAL HISTORY: Past Surgical History:  Procedure Laterality Date  . HERNIA REPAIR    . LAPAROTOMY N/A 06/12/2019   Procedure: EXPLORATORY LAPAROTOMY WITH SMALL BOWEL RESECTION;  Surgeon: Johnathan Hausen, MD;  Location: [facility];  Service: General;  Laterality: N/A;  . SHOULDER SURGERY      I have reviewed the social history and family history with the patient and they are unchanged from previous note.  ALLERGIES:  has No Known Allergies.  MEDICATIONS:  Current Outpatient Medications  Medication Sig Dispense Refill  . amLODipine (NORVASC) 10 MG tablet Take 1 tablet (10 mg total) by mouth daily. 30 tablet 4  . atorvastatin (LIPITOR) 10 MG tablet Take 1 tablet (10 mg total) by mouth daily at 6 PM. 30 tablet 4  . valsartan-hydrochlorothiazide (DIOVAN-HCT) 160-25 MG tablet Take 1 tablet by mouth daily. 30 tablet 4   No current facility-administered medications for this visit.    PHYSICAL EXAMINATION: ECOG PERFORMANCE STATUS: 0 - Asymptomatic  Vitals:   08/29/19 1058  BP: (!) 152/91  Pulse: 93  Resp: 18  Temp: 98 F (36.7 C)  SpO2: 100%   Filed Weights   08/29/19 1058  Weight: 244 lb 9.6 oz (110.9 kg)    Due to COVID19 we will limit examination to appearance. Patient had no complaints.  GENERAL:alert, no distress and comfortable SKIN: skin color normal, no rashes or significant lesions EYES: normal, Conjunctiva  are pink and non-injected, sclera clear  NEURO: alert & oriented x 3 with fluent speech   LABORATORY DATA:  I have reviewed the data as listed CBC Latest Ref Rng & Units 08/01/2019 06/15/2019 06/14/2019  WBC 4.0 - 10.5 K/uL 7.7 4.5 9.6  Hemoglobin 13.0 - 17.0 g/dL 14.7 14.7 15.1  Hematocrit 39.0 - 52.0 % 45.1 45.8 49.4  Platelets 150 - 400 K/uL 353 147(L) 280     CMP Latest Ref Rng & Units 08/04/2019 08/01/2019 06/15/2019  Glucose 70 - 99 mg/dL - 91 86  BUN 6 - 20 mg/dL - 25(H) 5(L)  Creatinine 0.61 - 1.24 mg/dL 1.60(H)  1.54(H) 1.18  Sodium 135 - 145 mmol/L - 142 141  Potassium 3.5 - 5.1 mmol/L - 3.8 3.5  Chloride 98 - 111 mmol/L - 105 104  CO2 22 - 32 mmol/L - 26 22  Calcium 8.9 - 10.3 mg/dL - 9.2 9.1  Total Protein 6.5 - 8.1 g/dL - 8.1 -  Total Bilirubin 0.3 - 1.2 mg/dL - 0.6 -  Alkaline Phos 38 - 126 U/L - 105 -  AST 15 - 41 U/L - 13(L) -  ALT 0 - 44 U/L - 23 -      RADIOGRAPHIC STUDIES: I have personally reviewed the radiological images as listed and agreed with the findings in the report. No results found.   ASSESSMENT & PLAN:  Dwayne Sawyer is a 49 y.o. male with   1. 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 Pathology confirmed well differentiated neuroendocrine tumor, with metastasis to 14 of 32 LNs. Margins clear.  -07/16/19 PET shows two right hepatic lesion which are intensely hypermetabolic consistent with metastatic NET. There is also a smaller less avid left hepatic lesion that is concerning for metastasis which was not seen on CT from 06/11/19. No other evidence of mets -I reviewed his MRI abdomen from 08/04/19 with pt in person which shows 3 liver lesions (0.8-4.4cm) compatible with neuroendocrine metastatic lesions.  -He was seen by Dr. Barry Dienes and she did not recommend surgery at this time due to the multiple liver mets.  -I started him on monthly Lanreotide injections on 08/01/19. He tolerated first injection moderately well with increased slightly formed stool output up to 5 times a day. He managed well. -I recommend he continue injections to treat and control his disease. If he responds well he may be able to do liver ablation down the road. I briefly discussed option treatment of Lutathera as a later line of treatment.  -Will proceed with second injection today. Will monitor with lab every 2-3 months and scan every 6 months.  -f/u in 3 months    2. HTN   -He is still trying to schedule first appointment with new PCP  -continue amlodipine and lisinopril-HCTZ. I encouraged him to lower salt in diet.   3. RA -previously on methotrexate and enbrel -not currently on therapy    4. Mild Diarrhea  -Secondary to lanreotide injections  -I encouraged him to use Imodium as needed to control this.    5. Elevated Cr  -Cr at 1.54 on 08/01/19. Will monitor. I encouraged him to drink plenty of water.     PLAN: -Proceed with injection today  -Injection every 4 weeks X3 -Lab in 2 months  -F/u in 3 months    No problem-specific Assessment & Plan notes found for this encounter.  No orders of the defined types were placed in this encounter.  All questions were answered. The patient knows to call the clinic with any problems, questions or concerns. No barriers to learning was detected. The total time spent in the appointment was 30 minutes.     Truitt Merle, MD 08/29/2019   I, Joslyn Devon, am acting as scribe for Truitt Merle, MD.   I have reviewed the above documentation for accuracy and completeness, and I agree with the above.

## 2019-08-29 ENCOUNTER — Inpatient Hospital Stay: Payer: Self-pay

## 2019-08-29 ENCOUNTER — Encounter: Payer: Self-pay | Admitting: Hematology

## 2019-08-29 ENCOUNTER — Inpatient Hospital Stay: Payer: Self-pay | Attending: Nurse Practitioner | Admitting: Hematology

## 2019-08-29 ENCOUNTER — Other Ambulatory Visit: Payer: Self-pay

## 2019-08-29 VITALS — BP 152/91 | HR 93 | Temp 98.0°F | Resp 18 | Ht 69.0 in | Wt 244.6 lb

## 2019-08-29 DIAGNOSIS — C7A8 Other malignant neuroendocrine tumors: Secondary | ICD-10-CM | POA: Insufficient documentation

## 2019-08-29 DIAGNOSIS — Z79899 Other long term (current) drug therapy: Secondary | ICD-10-CM | POA: Insufficient documentation

## 2019-08-29 DIAGNOSIS — D3A098 Benign carcinoid tumors of other sites: Secondary | ICD-10-CM

## 2019-08-29 DIAGNOSIS — C7B8 Other secondary neuroendocrine tumors: Secondary | ICD-10-CM | POA: Insufficient documentation

## 2019-08-29 MED ORDER — LANREOTIDE ACETATE 120 MG/0.5ML ~~LOC~~ SOLN
120.0000 mg | Freq: Once | SUBCUTANEOUS | Status: AC
  Administered 2019-08-29: 12:00:00 120 mg via SUBCUTANEOUS
  Filled 2019-08-29: qty 120

## 2019-08-29 NOTE — Patient Instructions (Signed)
Lanreotide injection What is this medicine? LANREOTIDE (lan REE oh tide) is used to reduce blood levels of growth hormone in patients with a condition called acromegaly. It also works to slow or stop tumor growth in patients with neuroendocrine tumors and treat carcinoid syndrome. This medicine may be used for other purposes; ask your health care provider or pharmacist if you have questions. COMMON BRAND NAME(S): Somatuline Depot What should I tell my health care provider before I take this medicine? They need to know if you have any of these conditions:  diabetes  gallbladder disease  heart disease  kidney disease  liver disease  thyroid disease  an unusual or allergic reaction to lanreotide, other medicines, foods, dyes, or preservatives  pregnant or trying to get pregnant  breast-feeding How should I use this medicine? This medicine is for injection under the skin. It is given by a health care professional in a hospital or clinic setting. Contact your pediatrician or health care professional regarding the use of this medicine in children. Special care may be needed. Overdosage: If you think you have taken too much of this medicine contact a poison control center or emergency room at once. NOTE: This medicine is only for you. Do not share this medicine with others. What if I miss a dose? It is important not to miss your dose. Call your doctor or health care professional if you are unable to keep an appointment. What may interact with this medicine? This medicine may interact with the following medications:  bromocriptine  cyclosporine  certain medicines for blood pressure, heart disease, irregular heart beat  certain medicines for diabetes  quinidine  terfenadine This list may not describe all possible interactions. Give your health care provider a list of all the medicines, herbs, non-prescription drugs, or dietary supplements you use. Also tell them if you smoke,  drink alcohol, or use illegal drugs. Some items may interact with your medicine. What should I watch for while using this medicine? Tell your doctor or healthcare professional if your symptoms do not start to get better or if they get worse. Visit your doctor or health care professional for regular checks on your progress. Your condition will be monitored carefully while you are receiving this medicine. This medicine may increase blood sugar. Ask your healthcare provider if changes in diet or medicines are needed if you have diabetes. You may need blood work done while you are taking this medicine. Women should inform their doctor if they wish to become pregnant or think they might be pregnant. There is a potential for serious side effects to an unborn child. Talk to your health care professional or pharmacist for more information. Do not breast-feed an infant while taking this medicine or for 6 months after stopping it. This medicine has caused ovarian failure in some women. This medicine may interfere with the ability to have a child. Talk with your doctor or health care professional if you are concerned about your fertility. What side effects may I notice from receiving this medicine? Side effects that you should report to your doctor or health care professional as soon as possible:  allergic reactions like skin rash, itching or hives, swelling of the face, lips, or tongue  increased blood pressure  severe stomach pain  signs and symptoms of hgh blood sugar such as being more thirsty or hungry or having to urinate more than normal. You may also feel very tired or have blurry vision.  signs and symptoms of low blood   sugar such as feeling anxious; confusion; dizziness; increased hunger; unusually weak or tired; sweating; shakiness; cold; irritable; headache; blurred vision; fast heartbeat; loss of consciousness  unusually slow heartbeat Side effects that usually do not require medical  attention (report to your doctor or health care professional if they continue or are bothersome):  constipation  diarrhea  dizziness  headache  muscle pain  muscle spasms  nausea  pain, redness, or irritation at site where injected This list may not describe all possible side effects. Call your doctor for medical advice about side effects. You may report side effects to FDA at 1-800-FDA-1088. Where should I keep my medicine? This drug is given in a hospital or clinic and will not be stored at home. NOTE: This sheet is a summary. It may not cover all possible information. If you have questions about this medicine, talk to your doctor, pharmacist, or health care provider.  2020 Elsevier/Gold Standard (2018-04-18 09:13:08)  

## 2019-09-01 ENCOUNTER — Ambulatory Visit: Payer: Self-pay | Attending: Family Medicine | Admitting: Pharmacist

## 2019-09-01 ENCOUNTER — Telehealth: Payer: Self-pay | Admitting: Hematology

## 2019-09-01 ENCOUNTER — Other Ambulatory Visit: Payer: Self-pay

## 2019-09-01 VITALS — BP 169/106 | HR 80

## 2019-09-01 DIAGNOSIS — I10 Essential (primary) hypertension: Secondary | ICD-10-CM

## 2019-09-01 NOTE — Progress Notes (Signed)
   S:    PCP: Dr. Chapman Fitch  Patient arrives in good spirits.    Presents to the clinic for hypertension evaluation, counseling, and management.  Patient was referred and last seen by Primary Care Provider on 08/15/19.    Patient reports adherence with medications.  Current BP Medications include:  Amlodipine 10 mg daily, Diovan-HCT 160-25 mg daily  Dietary habits include: does not limit salt; drinks 1 cup of coffee in the AM Exercise habits include: none outside of work; plans to increase this when the weather warms Family / Social history: - FHx: HLD, HTN  - Tobacco: smokeless tobacco daily - Alcohol: occasionally  O:  Vitals:   09/01/19 0941  BP: (!) 169/106  Pulse: 80    Home BP readings: none   Last 3 Office BP readings: BP Readings from Last 3 Encounters:  09/01/19 (!) 169/106  08/29/19 (!) 152/91  08/15/19 (!) 147/95   BMET    Component Value Date/Time   NA 142 08/01/2019 0912   K 3.8 08/01/2019 0912   CL 105 08/01/2019 0912   CO2 26 08/01/2019 0912   GLUCOSE 91 08/01/2019 0912   BUN 25 (H) 08/01/2019 0912   CREATININE 1.60 (H) 08/04/2019 0727   CREATININE 1.54 (H) 08/01/2019 0912   CALCIUM 9.2 08/01/2019 0912   GFRNONAA 51 (L) 08/01/2019 0912   GFRAA 60 (L) 08/01/2019 0912   Renal function: CrCl cannot be calculated (Patient's most recent lab result is older than the maximum 21 days allowed.).  Clinical ASCVD: No  The ASCVD Risk score Mikey Bussing DC Jr., et al., 2013) failed to calculate for the following reasons:   Cannot find a previous HDL lab   Cannot find a previous total cholesterol lab  A/P: Hypertension longstanding currently uncontrolled on current medications. BP Goal = <130/80 mmHg. Patient reports med adherence. Couold increase Diovan-HCT dose, however, pt's creatinine is trending up. Will order BMP before recommending change in therapy. -Continued current regimen.  -F/u labs ordered - BMP -Counseled on lifestyle modifications for blood pressure  control including reduced dietary sodium, increased exercise, adequate sleep  Results reviewed and written information provided.   Total time in face-to-face counseling 15 minutes.   F/U Clinic Visit in 2-3 weeks.   Benard Halsted, PharmD, [place] [phone]

## 2019-09-01 NOTE — Telephone Encounter (Signed)
Scheduled appt per 2/5 los.  Was not able to reach patient.  Sent a message to HIM pool to get a calendar mailed out.

## 2019-09-03 LAB — BASIC METABOLIC PANEL WITH GFR
BUN/Creatinine Ratio: 15 (ref 9–20)
BUN: 20 mg/dL (ref 6–24)
CO2: 23 mmol/L (ref 20–29)
Calcium: 9.6 mg/dL (ref 8.7–10.2)
Chloride: 102 mmol/L (ref 96–106)
Creatinine, Ser: 1.32 mg/dL — ABNORMAL HIGH (ref 0.76–1.27)
GFR calc Af Amer: 72 mL/min/1.73
GFR calc non Af Amer: 62 mL/min/1.73
Glucose: 91 mg/dL (ref 65–99)
Potassium: 4.6 mmol/L (ref 3.5–5.2)
Sodium: 142 mmol/L (ref 134–144)

## 2019-09-05 ENCOUNTER — Telehealth (INDEPENDENT_AMBULATORY_CARE_PROVIDER_SITE_OTHER): Payer: Self-pay

## 2019-09-05 NOTE — Telephone Encounter (Signed)
Patient verified date of birth. He is aware that creatinine is improved and now closer to normal. Nat Christen, CMA

## 2019-09-05 NOTE — Telephone Encounter (Signed)
-----   Message from Antony Blackbird, MD sent at 09/03/2019  8:38 AM EST ----- Creatinine improved and now closer to normal at 1.32 (normal 0.76-1.27)

## 2019-09-26 ENCOUNTER — Inpatient Hospital Stay: Payer: Self-pay | Attending: Nurse Practitioner

## 2019-09-26 ENCOUNTER — Other Ambulatory Visit: Payer: Self-pay

## 2019-09-26 ENCOUNTER — Telehealth: Payer: Self-pay | Admitting: Pharmacist

## 2019-09-26 VITALS — BP 157/89 | HR 85 | Temp 98.2°F | Resp 18

## 2019-09-26 DIAGNOSIS — C7A8 Other malignant neuroendocrine tumors: Secondary | ICD-10-CM | POA: Insufficient documentation

## 2019-09-26 DIAGNOSIS — C7B01 Secondary carcinoid tumors of distant lymph nodes: Secondary | ICD-10-CM | POA: Insufficient documentation

## 2019-09-26 DIAGNOSIS — D3A098 Benign carcinoid tumors of other sites: Secondary | ICD-10-CM

## 2019-09-26 MED ORDER — LANREOTIDE ACETATE 120 MG/0.5ML ~~LOC~~ SOLN
120.0000 mg | Freq: Once | SUBCUTANEOUS | Status: AC
  Administered 2019-09-26: 10:00:00 120 mg via SUBCUTANEOUS
  Filled 2019-09-26: qty 120

## 2019-09-26 NOTE — Telephone Encounter (Signed)
Tobie Lords, CPhT from [facility] called pharmacy to inform she'll be sending supply of Somatuline 120 mg for Mr. Revette - to arrive [place] on 10/07/19.

## 2019-09-26 NOTE — Patient Instructions (Signed)
Lanreotide injection What is this medicine? LANREOTIDE (lan REE oh tide) is used to reduce blood levels of growth hormone in patients with a condition called acromegaly. It also works to slow or stop tumor growth in patients with neuroendocrine tumors and treat carcinoid syndrome. This medicine may be used for other purposes; ask your health care provider or pharmacist if you have questions. COMMON BRAND NAME(S): Somatuline Depot What should I tell my health care provider before I take this medicine? They need to know if you have any of these conditions:  diabetes  gallbladder disease  heart disease  kidney disease  liver disease  thyroid disease  an unusual or allergic reaction to lanreotide, other medicines, foods, dyes, or preservatives  pregnant or trying to get pregnant  breast-feeding How should I use this medicine? This medicine is for injection under the skin. It is given by a health care professional in a hospital or clinic setting. Contact your pediatrician or health care professional regarding the use of this medicine in children. Special care may be needed. Overdosage: If you think you have taken too much of this medicine contact a poison control center or emergency room at once. NOTE: This medicine is only for you. Do not share this medicine with others. What if I miss a dose? It is important not to miss your dose. Call your doctor or health care professional if you are unable to keep an appointment. What may interact with this medicine? This medicine may interact with the following medications:  bromocriptine  cyclosporine  certain medicines for blood pressure, heart disease, irregular heart beat  certain medicines for diabetes  quinidine  terfenadine This list may not describe all possible interactions. Give your health care provider a list of all the medicines, herbs, non-prescription drugs, or dietary supplements you use. Also tell them if you smoke,  drink alcohol, or use illegal drugs. Some items may interact with your medicine. What should I watch for while using this medicine? Tell your doctor or healthcare professional if your symptoms do not start to get better or if they get worse. Visit your doctor or health care professional for regular checks on your progress. Your condition will be monitored carefully while you are receiving this medicine. This medicine may increase blood sugar. Ask your healthcare provider if changes in diet or medicines are needed if you have diabetes. You may need blood work done while you are taking this medicine. Women should inform their doctor if they wish to become pregnant or think they might be pregnant. There is a potential for serious side effects to an unborn child. Talk to your health care professional or pharmacist for more information. Do not breast-feed an infant while taking this medicine or for 6 months after stopping it. This medicine has caused ovarian failure in some women. This medicine may interfere with the ability to have a child. Talk with your doctor or health care professional if you are concerned about your fertility. What side effects may I notice from receiving this medicine? Side effects that you should report to your doctor or health care professional as soon as possible:  allergic reactions like skin rash, itching or hives, swelling of the face, lips, or tongue  increased blood pressure  severe stomach pain  signs and symptoms of hgh blood sugar such as being more thirsty or hungry or having to urinate more than normal. You may also feel very tired or have blurry vision.  signs and symptoms of low blood   sugar such as feeling anxious; confusion; dizziness; increased hunger; unusually weak or tired; sweating; shakiness; cold; irritable; headache; blurred vision; fast heartbeat; loss of consciousness  unusually slow heartbeat Side effects that usually do not require medical  attention (report to your doctor or health care professional if they continue or are bothersome):  constipation  diarrhea  dizziness  headache  muscle pain  muscle spasms  nausea  pain, redness, or irritation at site where injected This list may not describe all possible side effects. Call your doctor for medical advice about side effects. You may report side effects to FDA at 1-800-FDA-1088. Where should I keep my medicine? This drug is given in a hospital or clinic and will not be stored at home. NOTE: This sheet is a summary. It may not cover all possible information. If you have questions about this medicine, talk to your doctor, pharmacist, or health care provider.  2020 Elsevier/Gold Standard (2018-04-18 09:13:08)  

## 2019-10-09 ENCOUNTER — Other Ambulatory Visit: Payer: Self-pay | Admitting: Family Medicine

## 2019-10-09 DIAGNOSIS — C7B02 Secondary carcinoid tumors of liver: Secondary | ICD-10-CM

## 2019-10-09 DIAGNOSIS — Z79899 Other long term (current) drug therapy: Secondary | ICD-10-CM

## 2019-10-09 DIAGNOSIS — E785 Hyperlipidemia, unspecified: Secondary | ICD-10-CM

## 2019-10-09 DIAGNOSIS — N183 Chronic kidney disease, stage 3 unspecified: Secondary | ICD-10-CM

## 2019-10-09 DIAGNOSIS — I10 Essential (primary) hypertension: Secondary | ICD-10-CM

## 2019-10-09 NOTE — Progress Notes (Signed)
Patient ID: Dwayne Sawyer, male   DOB: 11-17-1970, 49 y.o.   MRN: [id number]   Patient will be contacted to schedule follow-up appointment with clinical pharmacist for blood pressure recheck and to also schedule for fasting blood work which will include lipid panel and comprehensive metabolic panel.  Orders for fasting labs placed.

## 2019-10-13 ENCOUNTER — Ambulatory Visit: Payer: Self-pay | Attending: Family Medicine | Admitting: Family Medicine

## 2019-10-13 ENCOUNTER — Telehealth: Payer: Self-pay | Admitting: Pharmacist

## 2019-10-13 ENCOUNTER — Other Ambulatory Visit: Payer: Self-pay

## 2019-10-13 ENCOUNTER — Encounter: Payer: Self-pay | Admitting: Family Medicine

## 2019-10-13 DIAGNOSIS — Z79899 Other long term (current) drug therapy: Secondary | ICD-10-CM

## 2019-10-13 DIAGNOSIS — C7B02 Secondary carcinoid tumors of liver: Secondary | ICD-10-CM

## 2019-10-13 DIAGNOSIS — N183 Chronic kidney disease, stage 3 unspecified: Secondary | ICD-10-CM

## 2019-10-13 DIAGNOSIS — E785 Hyperlipidemia, unspecified: Secondary | ICD-10-CM

## 2019-10-13 DIAGNOSIS — I10 Essential (primary) hypertension: Secondary | ICD-10-CM

## 2019-10-13 NOTE — Telephone Encounter (Signed)
Patient needs to follow-up with me for BP check per Dr. Chapman Fitch. Will forward to scheduling.

## 2019-10-13 NOTE — Progress Notes (Signed)
Virtual Visit via Telephone Note  I connected with Dwayne Sawyer on 10/13/19 at  9:10 AM EDT by telephone and verified that I am speaking with the correct person using two identifiers.   I discussed the limitations, risks, security and privacy concerns of performing an evaluation and management service by telephone and the availability of in person appointments. I also discussed with the patient that there may be a patient responsible charge related to this service. The patient expressed understanding and agreed to proceed.  Patient Location: Home Provider Location: [facility] Others participating in call: none   History of Present Illness:         49 year old male with ongoing medical issues of hypertension, chronic kidney disease, hyperlipidemia and intestinal carcinoid tumor who is being seen in follow-up.  He reports that he did meet with the clinical pharmacist and had blood work to help determine future changes in his blood pressure medication but patient reports that he never heard anything back regarding his lab results and also never heard anything regarding additional follow-up with the clinical pharmacist.  Patient reports that he continues to take his amlodipine 10 mg as well as valsartan-HCTZ 160-25 mg.  He also continues to take atorvastatin 10 mg daily for hyperlipidemia.  He denies any headaches or dizziness related to his blood pressure and denies any increased muscle aches associated with his use of atorvastatin.         He continues to undergo treatment at the cancer center for his carcinoid tumor.  He reports no nausea, vomiting or diarrhea associated with his treatments.  He states that the thing he has noticed is that he has large volumes of soft stools several times daily after receiving an infusion.  He also has some injection site soreness but otherwise feels well.  No current chest pain/palpitations, no shortness of breath or cough and no fever or chills.   Past Medical  History:  Diagnosis Date  . Arthritis   . Family history of carcinoid tumor   . High cholesterol   . Hypertension     Past Surgical History:  Procedure Laterality Date  . HERNIA REPAIR    . LAPAROTOMY N/A 06/12/2019   Procedure: EXPLORATORY LAPAROTOMY WITH SMALL BOWEL RESECTION;  Surgeon: Johnathan Hausen, MD;  Location: [facility];  Service: General;  Laterality: N/A;  . SHOULDER SURGERY      Family History  Problem Relation Age of Onset  . Cancer Father        widespread carcinoid cancer  . Hypertension Mother   . Hyperlipidemia Mother     Social History   Tobacco Use  . Smoking status: Never Smoker  . Smokeless tobacco: Current User    Types: Chew  Substance Use Topics  . Alcohol use: Yes    Comment: occ  . Drug use: Never     No Known Allergies     Observations/Objective: No vital signs or physical exam conducted as visit was done via telephone  Assessment and Plan: 1. Essential hypertension At patient's most recent infusion visit with oncology, blood pressure was 157/89 on review of chart.  He continues to take his amlodipine and valsartan hydrochlorothiazide.  Discussed with the patient that I will contact the clinical pharmacist and discuss whether or not patient needed increase in valsartan-HCTZ to a higher dose versus in office blood pressure rechecked by clinical pharmacist prior to changing medication.  Patient will be notified. -  Per clinical pharmacist, he would like for patient to  come in for blood pressure recheck prior to increasing the dose of the medication and he is attempting to contact the patient.  2. Stage 3 chronic kidney disease, unspecified whether stage 3a or 3b CKD Patient with basic metabolic panel done on [id number] which showed creatinine of 1.32.  Patient was scheduled for repeat comprehensive metabolic panel by cancer center but this does not appear to have been done.  His previous creatinine was 1.54 on 08/01/2019 with GFR 51.  He also however  had some evidence of dehydration with BUN of 25.  He is aware of the need to remain hydrated and avoid nonsteroidal anti-inflammatory medication and he denies any nausea/vomiting or diarrhea associated with his treatment for carcinoid tumor.  3. Hyperlipidemia, unspecified hyperlipidemia type;  He is currently on atorvastatin and patient has been asked to be fasting at his next appointment for recheck of lipids.  Additionally patient with metastasis of carcinoid tumor to the liver therefore we will have to continue to monitor LFTs but most recent AST and ALT were within normal on 08/01/2019 labs.  4. 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 Encounter for long-term (current) use of medications We will obtain comprehensive metabolic panel at upcoming visit for blood pressure recheck if patient has not had this done within the past 14 days by oncology at the time of his visit.  Patient is on atorvastatin and may need to have discontinuation of this medication if his liver enzymes are elevated and will need to recheck electrolytes/renal function in follow-up of hypertension and use of medication for treatment.  Follow Up Instructions:Return in about 4 months (around 02/12/2020) for Chronic issues-fasting; 1 to 2-week blood pressure follow-up with clinical pharmacist.    I discussed the assessment and treatment plan with the patient. The patient was provided an opportunity to ask questions and all were answered. The patient agreed with the plan and demonstrated an understanding of the instructions.   The patient was advised to call back or seek an in-person evaluation if the symptoms worsen or if the condition fails to improve as anticipated.  I provided 14 minutes of non-face-to-face time during this encounter.   Antony Blackbird, MD

## 2019-10-13 NOTE — Progress Notes (Signed)
F/U for HTN/CKD  Need lab results and would like to know if he should change his script.

## 2019-10-24 ENCOUNTER — Inpatient Hospital Stay: Payer: Self-pay | Attending: Nurse Practitioner

## 2019-10-24 ENCOUNTER — Inpatient Hospital Stay: Payer: Self-pay

## 2019-10-24 ENCOUNTER — Other Ambulatory Visit: Payer: Self-pay

## 2019-10-24 VITALS — BP 162/98 | HR 88 | Temp 98.7°F | Resp 18

## 2019-10-24 DIAGNOSIS — C7A8 Other malignant neuroendocrine tumors: Secondary | ICD-10-CM | POA: Insufficient documentation

## 2019-10-24 DIAGNOSIS — D3A098 Benign carcinoid tumors of other sites: Secondary | ICD-10-CM

## 2019-10-24 DIAGNOSIS — C7B01 Secondary carcinoid tumors of distant lymph nodes: Secondary | ICD-10-CM | POA: Insufficient documentation

## 2019-10-24 LAB — CBC WITH DIFFERENTIAL (CANCER CENTER ONLY)
Abs Immature Granulocytes: 0.02 10*3/uL (ref 0.00–0.07)
Basophils Absolute: 0.1 10*3/uL (ref 0.0–0.1)
Basophils Relative: 1 %
Eosinophils Absolute: 0.2 10*3/uL (ref 0.0–0.5)
Eosinophils Relative: 3 %
HCT: 44.8 % (ref 39.0–52.0)
Hemoglobin: 14.5 g/dL (ref 13.0–17.0)
Immature Granulocytes: 0 %
Lymphocytes Relative: 16 %
Lymphs Abs: 1.2 10*3/uL (ref 0.7–4.0)
MCH: 28 pg (ref 26.0–34.0)
MCHC: 32.4 g/dL (ref 30.0–36.0)
MCV: 86.7 fL (ref 80.0–100.0)
Monocytes Absolute: 0.5 10*3/uL (ref 0.1–1.0)
Monocytes Relative: 7 %
Neutro Abs: 5.6 10*3/uL (ref 1.7–7.7)
Neutrophils Relative %: 73 %
Platelet Count: 324 10*3/uL (ref 150–400)
RBC: 5.17 MIL/uL (ref 4.22–5.81)
RDW: 13.2 % (ref 11.5–15.5)
WBC Count: 7.6 10*3/uL (ref 4.0–10.5)
nRBC: 0 % (ref 0.0–0.2)

## 2019-10-24 LAB — CMP (CANCER CENTER ONLY)
ALT: 16 U/L (ref 0–44)
AST: 13 U/L — ABNORMAL LOW (ref 15–41)
Albumin: 4.3 g/dL (ref 3.5–5.0)
Alkaline Phosphatase: 88 U/L (ref 38–126)
Anion gap: 12 (ref 5–15)
BUN: 22 mg/dL — ABNORMAL HIGH (ref 6–20)
CO2: 27 mmol/L (ref 22–32)
Calcium: 9.6 mg/dL (ref 8.9–10.3)
Chloride: 104 mmol/L (ref 98–111)
Creatinine: 1.4 mg/dL — ABNORMAL HIGH (ref 0.61–1.24)
GFR, Est AFR Am: >60 mL/min (ref >60)
GFR, Estimated: 58 mL/min — ABNORMAL LOW (ref >60)
Glucose, Bld: 121 mg/dL — ABNORMAL HIGH (ref 70–99)
Potassium: 4 mmol/L (ref 3.5–5.1)
Sodium: 143 mmol/L (ref 135–145)
Total Bilirubin: 0.6 mg/dL (ref 0.3–1.2)
Total Protein: 8 g/dL (ref 6.5–8.1)

## 2019-10-24 MED ORDER — LANREOTIDE ACETATE 120 MG/0.5ML ~~LOC~~ SOLN
120.0000 mg | Freq: Once | SUBCUTANEOUS | Status: AC
  Administered 2019-10-24: 120 mg via SUBCUTANEOUS
  Filled 2019-10-24: qty 120

## 2019-10-28 LAB — CHROMOGRANIN A: Chromogranin A (ng/mL): 50.7 ng/mL (ref 0.0–101.8)

## 2019-10-29 ENCOUNTER — Telehealth: Payer: Self-pay | Admitting: *Deleted

## 2019-10-29 NOTE — Telephone Encounter (Signed)
Per Hendricks Limes, called to make pt aware of normal CBC and chromogranin A. Advised to hydrate more due to Cr. 1.4. Pt verbalized understanding. Also, labs were faxed to PCP Dr. Chapman Fitch for review along with suggestion for new bp med

## 2019-10-29 NOTE — Telephone Encounter (Signed)
-----   Message from Alla Feeling, NP sent at 10/28/2019  4:42 PM EDT ----- Please let him know CBC and chromogranin A are normal. CMP shows Cr 1.4, his level fluctuates. Encourage him to hydrate more. He has combination-HCTZ blood pressure pill, please fax labs to PCP to see if they recommend an alternative.  Thanks,  Regan Rakers

## 2019-11-14 ENCOUNTER — Ambulatory Visit: Payer: Self-pay | Attending: Family Medicine | Admitting: Pharmacist

## 2019-11-14 ENCOUNTER — Encounter: Payer: Self-pay | Admitting: Pharmacist

## 2019-11-14 ENCOUNTER — Other Ambulatory Visit: Payer: Self-pay

## 2019-11-14 VITALS — BP 152/99 | HR 78

## 2019-11-14 DIAGNOSIS — I10 Essential (primary) hypertension: Secondary | ICD-10-CM

## 2019-11-14 NOTE — Progress Notes (Signed)
   S:    PCP: Dr. Chapman Fitch  Patient arrives in good spirits.    Presents to the clinic for hypertension evaluation, counseling, and management.  Patient was referred and last seen by Primary Care Provider on 08/15/19.    Patient reports adherence with medications.  Current BP Medications include:  Amlodipine 10 mg daily, Diovan-HCT 160-25 mg daily  Dietary habits include: does not limit salt; drinks 1 cup of coffee in the AM Exercise habits include: none outside of work; plans to increase this when the weather warms Family / Social history: - FHx: HLD, HTN  - Tobacco: smokeless tobacco daily - Alcohol: occasionally  O:  Vitals:   11/14/19 1409  BP: (!) 152/99  Pulse: 78    Home BP readings: none   Last 3 Office BP readings: BP Readings from Last 3 Encounters:  11/14/19 (!) 152/99  10/24/19 (!) 162/98  09/26/19 (!) 157/89   BMET    Component Value Date/Time   NA 143 10/24/2019 1013   NA 142 09/01/2019 0915   K 4.0 10/24/2019 1013   CL 104 10/24/2019 1013   CO2 27 10/24/2019 1013   GLUCOSE 121 (H) 10/24/2019 1013   BUN 22 (H) 10/24/2019 1013   BUN 20 09/01/2019 0915   CREATININE 1.40 (H) 10/24/2019 1013   CALCIUM 9.6 10/24/2019 1013   GFRNONAA 58 (L) 10/24/2019 1013   GFRAA >60 10/24/2019 1013   Renal function: CrCl cannot be calculated (Patient's most recent lab result is older than the maximum 21 days allowed.).  Clinical ASCVD: No  The ASCVD Risk score Mikey Bussing DC Jr., et al., 2013) failed to calculate for the following reasons:   Cannot find a previous HDL lab   Cannot find a previous total cholesterol lab  A/P: Hypertension longstanding currently uncontrolled on current medications. BP Goal = <130/80 mmHg. Patient reports med adherence. Could increase Diovan-HCT dose, however, pt's creatinine fluctuates. Will order BMP. -Continued current regimen.  -F/u labs ordered - BMP -Counseled on lifestyle modifications for blood pressure control including reduced dietary  sodium, increased exercise, adequate sleep  Results reviewed and written information provided.   Total time in face-to-face counseling 15 minutes.   F/U with PCP. Will contact him regarding results.   Benard Halsted, PharmD, [place] [phone]

## 2019-11-15 ENCOUNTER — Other Ambulatory Visit: Payer: Self-pay | Admitting: Family Medicine

## 2019-11-15 DIAGNOSIS — N183 Chronic kidney disease, stage 3 unspecified: Secondary | ICD-10-CM

## 2019-11-15 LAB — BASIC METABOLIC PANEL WITH GFR
BUN/Creatinine Ratio: 12 (ref 9–20)
BUN: 17 mg/dL (ref 6–24)
CO2: 24 mmol/L (ref 20–29)
Calcium: 9.8 mg/dL (ref 8.7–10.2)
Chloride: 99 mmol/L (ref 96–106)
Creatinine, Ser: 1.46 mg/dL — ABNORMAL HIGH (ref 0.76–1.27)
GFR calc Af Amer: 63 mL/min/1.73
GFR calc non Af Amer: 55 mL/min/1.73 — ABNORMAL LOW
Glucose: 98 mg/dL (ref 65–99)
Potassium: 4.1 mmol/L (ref 3.5–5.2)
Sodium: 138 mmol/L (ref 134–144)

## 2019-11-19 NOTE — Progress Notes (Signed)
[facility]   Telephone:([phone]) [phone] Fax:(336) [phone]   Clinic Follow up Note   Patient Care Team: Antony Blackbird, MD as PCP - General (Family Medicine)  Date of Service:  11/21/2019  CHIEF COMPLAINT: F/u neuroendocrine tumor  SUMMARY OF ONCOLOGIC HISTORY: Oncology History  Carcinoid tumor of intestine producing obstruction--resected Nov 2020  06/11/2019 Imaging   US Abdomen 06/11/19  IMPRESSION: 1. There is a solid mass in the right lobe of the liver which based on current measurements may have enlarged since the prior study from 2015. Images from the previous MR at that time cannot be retrieved. Given this circumstance, it may be prudent to correlate with pre and serial post-contrast MR or CT of the liver to further evaluate. There is underlying diffuse increase in liver echogenicity, a finding indicative of hepatic steatosis.   2. Multiple loops of fluid-filled bowel. Question a degree of ileus or enteritis.   3. Study otherwise unremarkable. Note that much of the common bile duct is obscured by gas.    06/11/2019 Imaging   CT AP W Contrast 06/11/19   IMPRESSION: 1. High-grade small bowel obstruction secondary to desmoplastic response in the central right abdominal mesentery centered on a 4 cm calcified irregular/spiculated soft tissue lesion. Abrupt small bowel transition zone is identified immediately lead adjacent to this mesenteric lesion and multiple adjacent bowel loops are tethered into this region with mesenteric edema/congestion. The loop of small bowel immediately proximal to the transition zone shows mild circumferential wall thickening but no pneumatosis. 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 2. Small lymph nodes in the abnormal right mesentery suggest additional metastatic involvement. 3. Heterogeneous liver parenchyma, likely secondary to geographic fatty  deposition. There is a focal 14 mm low-density lesion in the right liver. The patient had an MRI in [facility] on 08/09/2013 to evaluate a right liver lesion, but those images are not available. Follow-up MRI without and with contrast recommended to exclude metastatic disease. 4. Tiny sclerotic foci in the T12 vertebral body in both femoral heads are likely benign, but close attention on follow-up recommended. 5.  Aortic Atherosclerois (ICD10-170.0)      06/12/2019 Surgery    EXPLORATORY LAPAROTOMY WITH SMALL BOWEL RESECTION by Dr. 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 Interval resection small bowel and mesenteric mass with no evidence residual mesenteric or small bowel neuroendocrine tumor. 2. Large lesion in RIGHT hepatic lobe with intense radiotracer activity consistent with well differentiated neuroendocrine tumor metastasis. Smaller lesion in the anterior LEFT hepatic lobe is concerning for second hepatic metastasis. 3. Pulmonary nodule measuring 6 mm in LEFT upper lobe is not have radiotracer activity. Smaller RIGHT upper lobe nodule. Recommend close attention on follow-up.   08/01/2019 -  Chemotherapy   Lanreotide injection monthly starting 08/01/19    08/04/2019 Imaging   MRI abdomen  IMPRESSION: 1. The previously noted lesion in the right lobe of the liver  has grown slightly compared to 2015. In addition, today's  study demonstrates 2 smaller lesions with similar imaging characteristics. Given the activity on the recent PET Dotatate scan, these lesions are all compatible with small neuroendocrine metastatic lesions. 2. Hepatic steatosis.      CURRENT THERAPY:  Lanreotide injection monthly starting 08/01/19  INTERVAL HISTORY:  Dwayne Sawyer is here for a follow up and treatment. He presents to the clinic alone.  He is clinically doing well overall, he stil has soft BM 3 times a day, not watery, no pain or gasy feeling.  He denies flushing episodes.  He has good appetite and eating well, weight is been stable.  Energy level is normal, review of system otherwise negative.   MEDICAL HISTORY:  Past Medical History:  Diagnosis Date  . Arthritis   . Family history of carcinoid tumor   . High cholesterol   . Hypertension     SURGICAL HISTORY: Past Surgical History:  Procedure Laterality Date  . HERNIA REPAIR    . LAPAROTOMY N/A 06/12/2019   Procedure: EXPLORATORY LAPAROTOMY WITH SMALL BOWEL RESECTION;  Surgeon: Johnathan Hausen, MD;  Location: [facility];  Service: General;  Laterality: N/A;  . SHOULDER SURGERY      I have reviewed the social history and family history with the patient and they are unchanged from previous note.  ALLERGIES:  has No Known Allergies.  MEDICATIONS:  Current Outpatient Medications  Medication Sig Dispense Refill  . amLODipine (NORVASC) 10 MG tablet Take 1 tablet (10 mg total) by mouth daily. 30 tablet 4  . atorvastatin (LIPITOR) 10 MG tablet Take 1 tablet (10 mg total) by mouth daily at 6 PM. 30 tablet 4  . valsartan-hydrochlorothiazide (DIOVAN-HCT) 160-25 MG tablet Take 1 tablet by mouth daily. 30 tablet 4   No current facility-administered medications for this visit.    PHYSICAL EXAMINATION: ECOG PERFORMANCE STATUS: 0 - Asymptomatic  Vitals:   11/21/19 1009  BP: (!) 158/99  Pulse: 91  Resp: 18  Temp: 98.7 F (37.1 C)  SpO2: 100%   Filed Weights    11/21/19 1009  Weight: 247 lb 12.8 oz (112.4 kg)   GENERAL:alert, no distress and comfortable SKIN: skin color, texture, turgor are normal, no rashes or significant lesions EYES: normal, Conjunctiva are pink and non-injected, sclera clear NECK: supple, thyroid normal size, non-tender, without nodularity LYMPH:  no palpable lymphadenopathy in the cervical, axillary  LUNGS: clear to auscultation and percussion with normal breathing effort HEART: regular rate & rhythm and no murmurs and no lower extremity edema ABDOMEN:abdomen soft, non-tender and normal bowel sounds Musculoskeletal:no cyanosis of digits and no clubbing  NEURO: alert & oriented x 3 with fluent speech, no focal motor/sensory deficits  LABORATORY DATA:  I have reviewed the data as listed CBC Latest Ref Rng & Units 10/24/2019 08/01/2019 06/15/2019  WBC 4.0 - 10.5 K/uL 7.6 7.7 4.5  Hemoglobin 13.0 - 17.0 g/dL 14.5 14.7 14.7  Hematocrit 39.0 - 52.0 % 44.8 45.1 45.8  Platelets 150 - 400 K/uL 324 353 147(L)     CMP Latest Ref Rng & Units 11/14/2019 10/24/2019 09/01/2019  Glucose 65 - 99 mg/dL 98 121(H) 91  BUN 6 - 24 mg/dL 17 22(H) 20  Creatinine 0.76 - 1.27 mg/dL 1.46(H) 1.40(H) 1.32(H)  Sodium 134 - 144 mmol/L 138 143 142  Potassium 3.5 - 5.2 mmol/L 4.1 4.0 4.6  Chloride 96 - 106 mmol/L 99 104 102  CO2 20 - 29 mmol/L 24 27 23   Calcium 8.7 -  10.2 mg/dL 9.8 9.6 9.6  Total Protein 6.5 - 8.1 g/dL - 8.0 -  Total Bilirubin 0.3 - 1.2 mg/dL - 0.6 -  Alkaline Phos 38 - 126 U/L - 88 -  AST 15 - 41 U/L - 13(L) -  ALT 0 - 44 U/L - 16 -      RADIOGRAPHIC STUDIES: I have personally reviewed the radiological images as listed and agreed with the findings in the report. No results found.   ASSESSMENT & PLAN:  Sachin Gassner is a 49 y.o. male with    1. 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 Pathology  confirmed well differentiated neuroendocrine tumor, with metastasis to 14 of 32 LNs. Margins clear. -07/16/19 PETshowstworight hepatic lesion which areintensely hypermetabolic consistent with metastatic NET. There is also a smaller less avid left hepatic lesion that is concerning for metastasis which was not seen on CT from 06/11/19.No other evidence of mets -I reviewed his MRI abdomen from 08/04/19 with pt in person which shows 3 liver lesions (0.8-4.4cm) compatible with neuroendocrine metastatic lesions.  -He was seen by Dr. Barry Dienes and she did not recommend surgery at this time due to the multiple liver mets.  -I started him on monthly Lanreotide injections on 08/01/19. He tolerated first injection moderately well with increased slightly formed stool output up to 5 times a day. He managed well. -I recommend he continue injections to treat and control his disease. If he responds well he may be able to do liver ablation down the road. I briefly discussed option treatment of Lutathera as a later line of treatment.  -He is clinically doing well, tolerating treatment well, minimally symptomatic with 3 bowel movements a day, not diarrhea. -Lab from last month reviewed, tumor marker chromogranin A normal, creatinine 1.4, overall stable. -Continue injection every 4 weeks -Plan to repeat abdominal MRI with and without contrast in 3 months   2. HTN  -He is still trying to schedulefirst appointment with new PCP  -continue amlodipine and lisinopril-HCTZ. I encouraged him to lower salt in diet.  3. RA -previously on methotrexate and enbrel -not currently on therapy    4. Mild Diarrhea  -Secondary to lanreotide injections  -I encouraged him to use Imodium as needed to control this.    5. Elevated Cr  -stable. Will monitor. I encouraged him to drink plenty of water.    PLAN: -Proceed with injection today  -Injection every 4 weeks X3 -F/u in 3 months with lab and abd MRI a week  before     No problem-specific Assessment & Plan notes found for this encounter.   Orders Placed This Encounter  Procedures  . MR Abdomen W Wo Contrast    Standing Status:   Future    Standing Expiration Date:   01/20/2021    Order Specific Question:   If indicated for the ordered procedure, I authorize the administration of contrast media per Radiology protocol    Answer:   Yes    Order Specific Question:   What is the patient's sedation requirement?    Answer:   No Sedation    Order Specific Question:   Does the patient have a pacemaker or implanted devices?    Answer:   No    Order Specific Question:   Radiology Contrast Protocol - do NOT remove file path    Answer:   [email or web address]    Order  Specific Question:   Preferred imaging location?    Answer:   [facility] (table limit - 550 lbs)   All questions were answered. The patient knows to call the clinic with any problems, questions or concerns. No barriers to learning was detected. The total time spent in the appointment was 25 minutes.     Truitt Merle, MD 11/21/2019   I, Joslyn Devon, am acting as scribe for Truitt Merle, MD.   I have reviewed the above documentation for accuracy and completeness, and I agree with the above.

## 2019-11-21 ENCOUNTER — Encounter: Payer: Self-pay | Admitting: Hematology

## 2019-11-21 ENCOUNTER — Inpatient Hospital Stay (HOSPITAL_BASED_OUTPATIENT_CLINIC_OR_DEPARTMENT_OTHER): Payer: Self-pay | Admitting: Hematology

## 2019-11-21 ENCOUNTER — Other Ambulatory Visit: Payer: Self-pay

## 2019-11-21 ENCOUNTER — Inpatient Hospital Stay: Payer: Self-pay

## 2019-11-21 VITALS — BP 158/99 | HR 91 | Temp 98.7°F | Resp 18 | Ht 69.0 in | Wt 247.8 lb

## 2019-11-21 DIAGNOSIS — D3A098 Benign carcinoid tumors of other sites: Secondary | ICD-10-CM

## 2019-11-21 MED ORDER — LANREOTIDE ACETATE 120 MG/0.5ML ~~LOC~~ SOLN
120.0000 mg | Freq: Once | SUBCUTANEOUS | Status: AC
  Administered 2019-11-21: 120 mg via SUBCUTANEOUS
  Filled 2019-11-21: qty 120

## 2019-11-21 NOTE — Patient Instructions (Signed)
Lanreotide injection What is this medicine? LANREOTIDE (lan REE oh tide) is used to reduce blood levels of growth hormone in patients with a condition called acromegaly. It also works to slow or stop tumor growth in patients with neuroendocrine tumors and treat carcinoid syndrome. This medicine may be used for other purposes; ask your health care provider or pharmacist if you have questions. COMMON BRAND NAME(S): Somatuline Depot What should I tell my health care provider before I take this medicine? They need to know if you have any of these conditions:  diabetes  gallbladder disease  heart disease  kidney disease  liver disease  thyroid disease  an unusual or allergic reaction to lanreotide, other medicines, foods, dyes, or preservatives  pregnant or trying to get pregnant  breast-feeding How should I use this medicine? This medicine is for injection under the skin. It is given by a health care professional in a hospital or clinic setting. Contact your pediatrician or health care professional regarding the use of this medicine in children. Special care may be needed. Overdosage: If you think you have taken too much of this medicine contact a poison control center or emergency room at once. NOTE: This medicine is only for you. Do not share this medicine with others. What if I miss a dose? It is important not to miss your dose. Call your doctor or health care professional if you are unable to keep an appointment. What may interact with this medicine? This medicine may interact with the following medications:  bromocriptine  cyclosporine  certain medicines for blood pressure, heart disease, irregular heart beat  certain medicines for diabetes  quinidine  terfenadine This list may not describe all possible interactions. Give your health care provider a list of all the medicines, herbs, non-prescription drugs, or dietary supplements you use. Also tell them if you smoke,  drink alcohol, or use illegal drugs. Some items may interact with your medicine. What should I watch for while using this medicine? Tell your doctor or healthcare professional if your symptoms do not start to get better or if they get worse. Visit your doctor or health care professional for regular checks on your progress. Your condition will be monitored carefully while you are receiving this medicine. This medicine may increase blood sugar. Ask your healthcare provider if changes in diet or medicines are needed if you have diabetes. You may need blood work done while you are taking this medicine. Women should inform their doctor if they wish to become pregnant or think they might be pregnant. There is a potential for serious side effects to an unborn child. Talk to your health care professional or pharmacist for more information. Do not breast-feed an infant while taking this medicine or for 6 months after stopping it. This medicine has caused ovarian failure in some women. This medicine may interfere with the ability to have a child. Talk with your doctor or health care professional if you are concerned about your fertility. What side effects may I notice from receiving this medicine? Side effects that you should report to your doctor or health care professional as soon as possible:  allergic reactions like skin rash, itching or hives, swelling of the face, lips, or tongue  increased blood pressure  severe stomach pain  signs and symptoms of hgh blood sugar such as being more thirsty or hungry or having to urinate more than normal. You may also feel very tired or have blurry vision.  signs and symptoms of low blood   sugar such as feeling anxious; confusion; dizziness; increased hunger; unusually weak or tired; sweating; shakiness; cold; irritable; headache; blurred vision; fast heartbeat; loss of consciousness  unusually slow heartbeat Side effects that usually do not require medical  attention (report to your doctor or health care professional if they continue or are bothersome):  constipation  diarrhea  dizziness  headache  muscle pain  muscle spasms  nausea  pain, redness, or irritation at site where injected This list may not describe all possible side effects. Call your doctor for medical advice about side effects. You may report side effects to FDA at 1-800-FDA-1088. Where should I keep my medicine? This drug is given in a hospital or clinic and will not be stored at home. NOTE: This sheet is a summary. It may not cover all possible information. If you have questions about this medicine, talk to your doctor, pharmacist, or health care provider.  2020 Elsevier/Gold Standard (2018-04-18 09:13:08)  

## 2019-11-24 ENCOUNTER — Telehealth: Payer: Self-pay | Admitting: Hematology

## 2019-11-24 NOTE — Telephone Encounter (Signed)
Scheduled appt per 4/30 los.  Left a vm of the next scheduled appt date and time.

## 2019-12-19 ENCOUNTER — Other Ambulatory Visit: Payer: Self-pay

## 2019-12-19 ENCOUNTER — Inpatient Hospital Stay: Payer: Self-pay | Attending: Nurse Practitioner

## 2019-12-19 ENCOUNTER — Ambulatory Visit: Payer: Self-pay | Admitting: Pharmacist

## 2019-12-19 VITALS — BP 167/111 | HR 81 | Resp 20

## 2019-12-19 DIAGNOSIS — C7A8 Other malignant neuroendocrine tumors: Secondary | ICD-10-CM | POA: Insufficient documentation

## 2019-12-19 DIAGNOSIS — D3A098 Benign carcinoid tumors of other sites: Secondary | ICD-10-CM

## 2019-12-19 DIAGNOSIS — C7B01 Secondary carcinoid tumors of distant lymph nodes: Secondary | ICD-10-CM | POA: Insufficient documentation

## 2019-12-19 MED ORDER — LANREOTIDE ACETATE 120 MG/0.5ML ~~LOC~~ SOLN
120.0000 mg | Freq: Once | SUBCUTANEOUS | Status: AC
  Administered 2019-12-19: 120 mg via SUBCUTANEOUS
  Filled 2019-12-19: qty 120

## 2019-12-19 NOTE — Patient Instructions (Signed)
Lanreotide injection What is this medicine? LANREOTIDE (lan REE oh tide) is used to reduce blood levels of growth hormone in patients with a condition called acromegaly. It also works to slow or stop tumor growth in patients with neuroendocrine tumors and treat carcinoid syndrome. This medicine may be used for other purposes; ask your health care provider or pharmacist if you have questions. COMMON BRAND NAME(S): Somatuline Depot What should I tell my health care provider before I take this medicine? They need to know if you have any of these conditions:  diabetes  gallbladder disease  heart disease  kidney disease  liver disease  thyroid disease  an unusual or allergic reaction to lanreotide, other medicines, foods, dyes, or preservatives  pregnant or trying to get pregnant  breast-feeding How should I use this medicine? This medicine is for injection under the skin. It is given by a health care professional in a hospital or clinic setting. Contact your pediatrician or health care professional regarding the use of this medicine in children. Special care may be needed. Overdosage: If you think you have taken too much of this medicine contact a poison control center or emergency room at once. NOTE: This medicine is only for you. Do not share this medicine with others. What if I miss a dose? It is important not to miss your dose. Call your doctor or health care professional if you are unable to keep an appointment. What may interact with this medicine? This medicine may interact with the following medications:  bromocriptine  cyclosporine  certain medicines for blood pressure, heart disease, irregular heart beat  certain medicines for diabetes  quinidine  terfenadine This list may not describe all possible interactions. Give your health care provider a list of all the medicines, herbs, non-prescription drugs, or dietary supplements you use. Also tell them if you smoke,  drink alcohol, or use illegal drugs. Some items may interact with your medicine. What should I watch for while using this medicine? Tell your doctor or healthcare professional if your symptoms do not start to get better or if they get worse. Visit your doctor or health care professional for regular checks on your progress. Your condition will be monitored carefully while you are receiving this medicine. This medicine may increase blood sugar. Ask your healthcare provider if changes in diet or medicines are needed if you have diabetes. You may need blood work done while you are taking this medicine. Women should inform their doctor if they wish to become pregnant or think they might be pregnant. There is a potential for serious side effects to an unborn child. Talk to your health care professional or pharmacist for more information. Do not breast-feed an infant while taking this medicine or for 6 months after stopping it. This medicine has caused ovarian failure in some women. This medicine may interfere with the ability to have a child. Talk with your doctor or health care professional if you are concerned about your fertility. What side effects may I notice from receiving this medicine? Side effects that you should report to your doctor or health care professional as soon as possible:  allergic reactions like skin rash, itching or hives, swelling of the face, lips, or tongue  increased blood pressure  severe stomach pain  signs and symptoms of hgh blood sugar such as being more thirsty or hungry or having to urinate more than normal. You may also feel very tired or have blurry vision.  signs and symptoms of low blood   sugar such as feeling anxious; confusion; dizziness; increased hunger; unusually weak or tired; sweating; shakiness; cold; irritable; headache; blurred vision; fast heartbeat; loss of consciousness  unusually slow heartbeat Side effects that usually do not require medical  attention (report to your doctor or health care professional if they continue or are bothersome):  constipation  diarrhea  dizziness  headache  muscle pain  muscle spasms  nausea  pain, redness, or irritation at site where injected This list may not describe all possible side effects. Call your doctor for medical advice about side effects. You may report side effects to FDA at 1-800-FDA-1088. Where should I keep my medicine? This drug is given in a hospital or clinic and will not be stored at home. NOTE: This sheet is a summary. It may not cover all possible information. If you have questions about this medicine, talk to your doctor, pharmacist, or health care provider.  2020 Elsevier/Gold Standard (2018-04-18 09:13:08)  

## 2020-01-13 LAB — SURGICAL PATHOLOGY

## 2020-01-16 ENCOUNTER — Other Ambulatory Visit: Payer: Self-pay

## 2020-01-16 ENCOUNTER — Inpatient Hospital Stay: Payer: Self-pay | Attending: Nurse Practitioner

## 2020-01-16 VITALS — BP 148/74 | HR 84 | Resp 18

## 2020-01-16 DIAGNOSIS — C7B01 Secondary carcinoid tumors of distant lymph nodes: Secondary | ICD-10-CM | POA: Insufficient documentation

## 2020-01-16 DIAGNOSIS — C7A8 Other malignant neuroendocrine tumors: Secondary | ICD-10-CM | POA: Insufficient documentation

## 2020-01-16 DIAGNOSIS — D3A098 Benign carcinoid tumors of other sites: Secondary | ICD-10-CM

## 2020-01-16 MED ORDER — LANREOTIDE ACETATE 120 MG/0.5ML ~~LOC~~ SOLN
120.0000 mg | Freq: Once | SUBCUTANEOUS | Status: AC
  Administered 2020-01-16: 120 mg via SUBCUTANEOUS
  Filled 2020-01-16: qty 120

## 2020-01-16 NOTE — Patient Instructions (Signed)
Lanreotide injection What is this medicine? LANREOTIDE (lan REE oh tide) is used to reduce blood levels of growth hormone in patients with a condition called acromegaly. It also works to slow or stop tumor growth in patients with neuroendocrine tumors and treat carcinoid syndrome. This medicine may be used for other purposes; ask your health care provider or pharmacist if you have questions. COMMON BRAND NAME(S): Somatuline Depot What should I tell my health care provider before I take this medicine? They need to know if you have any of these conditions:  diabetes  gallbladder disease  heart disease  kidney disease  liver disease  thyroid disease  an unusual or allergic reaction to lanreotide, other medicines, foods, dyes, or preservatives  pregnant or trying to get pregnant  breast-feeding How should I use this medicine? This medicine is for injection under the skin. It is given by a health care professional in a hospital or clinic setting. Contact your pediatrician or health care professional regarding the use of this medicine in children. Special care may be needed. Overdosage: If you think you have taken too much of this medicine contact a poison control center or emergency room at once. NOTE: This medicine is only for you. Do not share this medicine with others. What if I miss a dose? It is important not to miss your dose. Call your doctor or health care professional if you are unable to keep an appointment. What may interact with this medicine? This medicine may interact with the following medications:  bromocriptine  cyclosporine  certain medicines for blood pressure, heart disease, irregular heart beat  certain medicines for diabetes  quinidine  terfenadine This list may not describe all possible interactions. Give your health care provider a list of all the medicines, herbs, non-prescription drugs, or dietary supplements you use. Also tell them if you smoke,  drink alcohol, or use illegal drugs. Some items may interact with your medicine. What should I watch for while using this medicine? Tell your doctor or healthcare professional if your symptoms do not start to get better or if they get worse. Visit your doctor or health care professional for regular checks on your progress. Your condition will be monitored carefully while you are receiving this medicine. This medicine may increase blood sugar. Ask your healthcare provider if changes in diet or medicines are needed if you have diabetes. You may need blood work done while you are taking this medicine. Women should inform their doctor if they wish to become pregnant or think they might be pregnant. There is a potential for serious side effects to an unborn child. Talk to your health care professional or pharmacist for more information. Do not breast-feed an infant while taking this medicine or for 6 months after stopping it. This medicine has caused ovarian failure in some women. This medicine may interfere with the ability to have a child. Talk with your doctor or health care professional if you are concerned about your fertility. What side effects may I notice from receiving this medicine? Side effects that you should report to your doctor or health care professional as soon as possible:  allergic reactions like skin rash, itching or hives, swelling of the face, lips, or tongue  increased blood pressure  severe stomach pain  signs and symptoms of hgh blood sugar such as being more thirsty or hungry or having to urinate more than normal. You may also feel very tired or have blurry vision.  signs and symptoms of low blood   sugar such as feeling anxious; confusion; dizziness; increased hunger; unusually weak or tired; sweating; shakiness; cold; irritable; headache; blurred vision; fast heartbeat; loss of consciousness  unusually slow heartbeat Side effects that usually do not require medical  attention (report to your doctor or health care professional if they continue or are bothersome):  constipation  diarrhea  dizziness  headache  muscle pain  muscle spasms  nausea  pain, redness, or irritation at site where injected This list may not describe all possible side effects. Call your doctor for medical advice about side effects. You may report side effects to FDA at 1-800-FDA-1088. Where should I keep my medicine? This drug is given in a hospital or clinic and will not be stored at home. NOTE: This sheet is a summary. It may not cover all possible information. If you have questions about this medicine, talk to your doctor, pharmacist, or health care provider.  2020 Elsevier/Gold Standard (2018-04-18 09:13:08)  

## 2020-01-25 ENCOUNTER — Other Ambulatory Visit: Payer: Self-pay | Admitting: Family Medicine

## 2020-01-25 DIAGNOSIS — E785 Hyperlipidemia, unspecified: Secondary | ICD-10-CM

## 2020-01-25 DIAGNOSIS — I10 Essential (primary) hypertension: Secondary | ICD-10-CM

## 2020-01-25 NOTE — Telephone Encounter (Signed)
Requested Prescriptions  Pending Prescriptions Disp Refills  . atorvastatin (LIPITOR) 10 MG tablet [Pharmacy Med Name: ATORVASTATIN 10 MG TABLET] 30 tablet 3    Sig: TAKE ONE TABLET BY MOUTH DAILY AT 6:00 IN THE EVENING     Cardiovascular:  Antilipid - Statins Failed - 01/25/2020  6:34 AM      Failed - Total Cholesterol in normal range and within 360 days    No results found for: CHOL, POCCHOL, CHOLTOT       Failed - LDL in normal range and within 360 days    No results found for: LDLCALC, LDLC, HIRISKLDL, POCLDL, LDLDIRECT, REALLDLC, TOTLDLC       Failed - HDL in normal range and within 360 days    No results found for: HDL, POCHDL       Failed - Triglycerides in normal range and within 360 days    No results found for: TRIG, POCTRIG       Passed - Patient is not pregnant      Passed - Valid encounter within last 12 months    Recent Outpatient Visits          2 months ago Essential hypertension   [place], Jarome Matin, RPH-CPP   3 months ago Essential hypertension   [place], MD   4 months ago Essential hypertension   [place], [place] L, RPH-CPP   5 months ago Stage 3 chronic kidney disease, unspecified whether stage 3a or 3b CKD   [place], [place], MD      Future Appointments            In 2 weeks Antony Blackbird, MD [place]           . amLODipine (NORVASC) 10 MG tablet [Pharmacy Med Name: amLODIPine BESYLATE 10 MG TAB] 30 tablet 3    Sig: TAKE ONE TABLET BY MOUTH DAILY     Cardiovascular:  Calcium Channel Blockers Failed - 01/25/2020  6:34 AM      Failed - Last BP in normal range    BP Readings from Last 1 Encounters:  01/16/20 (!) 148/74         Passed - Valid encounter within last 6 months    Recent Outpatient Visits          2 months ago Essential hypertension    [place], Jarome Matin, RPH-CPP   3 months ago Essential hypertension   [place], MD   4 months ago Essential hypertension   [place], [place] L, RPH-CPP   5 months ago Stage 3 chronic kidney disease, unspecified whether stage 3a or 3b CKD   [place], MD      Future Appointments            In 2 weeks Antony Blackbird, MD [place]           . valsartan-hydrochlorothiazide (DIOVAN-HCT) 160-25 MG tablet [Pharmacy Med Name: VALSARTAN-HCTZ 160-25 MG TAB] 30 tablet 3    Sig: TAKE ONE TABLET BY MOUTH DAILY     Cardiovascular: ARB + Diuretic Combos Failed - 01/25/2020  6:34 AM      Failed - Cr in normal range and  within 180 days    Creatinine  Date Value Ref Range Status  10/24/2019 1.40 (H) 0.61 - 1.24 mg/dL Final   Creatinine, Ser  Date Value Ref Range Status  11/14/2019 1.46 (H) 0.76 - 1.27 mg/dL Final         Failed - Last BP in normal range    BP Readings from Last 1 Encounters:  01/16/20 (!) 148/74         Passed - K in normal range and within 180 days    Potassium  Date Value Ref Range Status  11/14/2019 4.1 3.5 - 5.2 mmol/L Final         Passed - Na in normal range and within 180 days    Sodium  Date Value Ref Range Status  11/14/2019 138 134 - 144 mmol/L Final         Passed - Ca in normal range and within 180 days    Calcium  Date Value Ref Range Status  11/14/2019 9.8 8.7 - 10.2 mg/dL Final         Passed - Patient is not pregnant      Passed - Valid encounter within last 6 months    Recent Outpatient Visits          2 months ago Essential hypertension   [place], Jarome Matin, RPH-CPP   3 months ago Essential hypertension   [place], MD   4 months ago  Essential hypertension   [place], [place] L, RPH-CPP   5 months ago Stage 3 chronic kidney disease, unspecified whether stage 3a or 3b CKD   [place], MD      Future Appointments            In 2 weeks Antony Blackbird, MD [place]

## 2020-01-25 NOTE — Telephone Encounter (Signed)
Requested medications are due for refill today?  Yes  Requested medications are on active medication list?  Yes  Last Refill:  08/15/2019  # 30 with 4 refills  Future visit scheduled?  Yes in 2 weeks.    Notes to Clinic:  Medication failed RX refill protocol due to no labs within the past 360 days.

## 2020-01-28 ENCOUNTER — Telehealth: Payer: Self-pay

## 2020-01-28 NOTE — Telephone Encounter (Signed)
I received a phone call from Mollie Germany, NP from [facility] [phone].  She wanted to make sure that Mr Nardozzi could take estrogen.  Dr. Burr Medico reviewed his medical record.  Per Dr. Burr Medico there is no contraindication to his use of estrogen.  I relayed this information to Ms Wilfred Curtis.

## 2020-02-06 ENCOUNTER — Inpatient Hospital Stay: Payer: Self-pay | Attending: Nurse Practitioner

## 2020-02-06 ENCOUNTER — Other Ambulatory Visit: Payer: Self-pay

## 2020-02-06 DIAGNOSIS — D3A098 Benign carcinoid tumors of other sites: Secondary | ICD-10-CM

## 2020-02-06 DIAGNOSIS — C7A8 Other malignant neuroendocrine tumors: Secondary | ICD-10-CM | POA: Insufficient documentation

## 2020-02-06 DIAGNOSIS — Z79899 Other long term (current) drug therapy: Secondary | ICD-10-CM | POA: Insufficient documentation

## 2020-02-06 DIAGNOSIS — C7B8 Other secondary neuroendocrine tumors: Secondary | ICD-10-CM | POA: Insufficient documentation

## 2020-02-06 LAB — CBC WITH DIFFERENTIAL (CANCER CENTER ONLY)
Abs Immature Granulocytes: 0.02 10*3/uL (ref 0.00–0.07)
Basophils Absolute: 0.1 10*3/uL (ref 0.0–0.1)
Basophils Relative: 1 %
Eosinophils Absolute: 0.2 10*3/uL (ref 0.0–0.5)
Eosinophils Relative: 3 %
HCT: 43.9 % (ref 39.0–52.0)
Hemoglobin: 14.4 g/dL (ref 13.0–17.0)
Immature Granulocytes: 0 %
Lymphocytes Relative: 15 %
Lymphs Abs: 1.3 10*3/uL (ref 0.7–4.0)
MCH: 29.1 pg (ref 26.0–34.0)
MCHC: 32.8 g/dL (ref 30.0–36.0)
MCV: 88.7 fL (ref 80.0–100.0)
Monocytes Absolute: 0.6 10*3/uL (ref 0.1–1.0)
Monocytes Relative: 7 %
Neutro Abs: 6 10*3/uL (ref 1.7–7.7)
Neutrophils Relative %: 74 %
Platelet Count: 339 10*3/uL (ref 150–400)
RBC: 4.95 MIL/uL (ref 4.22–5.81)
RDW: 13.5 % (ref 11.5–15.5)
WBC Count: 8.1 10*3/uL (ref 4.0–10.5)
nRBC: 0 % (ref 0.0–0.2)

## 2020-02-06 LAB — CMP (CANCER CENTER ONLY)
ALT: 26 U/L (ref 0–44)
AST: 15 U/L (ref 15–41)
Albumin: 4.4 g/dL (ref 3.5–5.0)
Alkaline Phosphatase: 77 U/L (ref 38–126)
Anion gap: 11 (ref 5–15)
BUN: 20 mg/dL (ref 6–20)
CO2: 25 mmol/L (ref 22–32)
Calcium: 9.9 mg/dL (ref 8.9–10.3)
Chloride: 102 mmol/L (ref 98–111)
Creatinine: 1.61 mg/dL — ABNORMAL HIGH (ref 0.61–1.24)
GFR, Est AFR Am: 57 mL/min — ABNORMAL LOW (ref >60)
GFR, Estimated: 49 mL/min — ABNORMAL LOW (ref >60)
Glucose, Bld: 236 mg/dL — ABNORMAL HIGH (ref 70–99)
Potassium: 3.8 mmol/L (ref 3.5–5.1)
Sodium: 138 mmol/L (ref 135–145)
Total Bilirubin: 0.6 mg/dL (ref 0.3–1.2)
Total Protein: 8.1 g/dL (ref 6.5–8.1)

## 2020-02-09 ENCOUNTER — Other Ambulatory Visit: Payer: Self-pay | Admitting: *Deleted

## 2020-02-09 DIAGNOSIS — D3A098 Benign carcinoid tumors of other sites: Secondary | ICD-10-CM

## 2020-02-09 LAB — CHROMOGRANIN A: Chromogranin A (ng/mL): 56.2 ng/mL (ref 0.0–101.8)

## 2020-02-09 NOTE — Progress Notes (Signed)
[place]   [phone] [phone]   Clinic Follow up Note   Patient Care Team: Antony Blackbird, MD as PCP - General (Family Medicine)  Date of Service:  02/13/2020  CHIEF COMPLAINT: F/u neuroendocrine tumor  SUMMARY OF ONCOLOGIC HISTORY: Oncology History Overview Note  Cancer Staging No matching staging information was found for the patient.    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 There is a solid mass in the right lobe of the liver which based on current measurements may have enlarged since the prior study from 2015. Images from the previous MR at that time cannot be retrieved. Given this circumstance, it may be prudent to correlate with pre and serial post-contrast MR or CT of the liver to further evaluate. There is underlying diffuse increase in liver echogenicity, a finding indicative of hepatic steatosis.   2. Multiple loops of fluid-filled bowel. Question a degree of ileus or enteritis.   3. Study otherwise unremarkable. Note that much of the common bile duct is obscured by gas.    06/11/2019 Imaging   CT AP W Contrast 06/11/19   IMPRESSION: 1. High-grade small bowel obstruction secondary to desmoplastic response in the central right abdominal mesentery centered on a 4 cm calcified irregular/spiculated soft tissue lesion. Abrupt small bowel transition zone is identified immediately lead adjacent to this mesenteric lesion and multiple adjacent bowel loops are tethered into this region with mesenteric edema/congestion. The loop of small bowel immediately proximal to the transition zone shows mild circumferential wall thickening but no pneumatosis. 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 2. Small lymph nodes in the abnormal right mesentery suggest additional metastatic  involvement. 3. Heterogeneous liver parenchyma, likely secondary to geographic fatty deposition. There is a focal 14 mm low-density lesion in the right liver. The patient had an MRI in the [facility] system on 08/09/2013 to evaluate a right liver lesion, but those images are not available. Follow-up MRI without and with contrast recommended to exclude metastatic disease. 4. Tiny sclerotic foci in the T12 vertebral body in both femoral heads are likely benign, but close attention on follow-up recommended. 5.  Aortic Atherosclerois (ICD10-170.0)      06/12/2019 Surgery    EXPLORATORY LAPAROTOMY WITH SMALL BOWEL RESECTION by Dr. 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 Interval resection small bowel and mesenteric mass with no evidence residual mesenteric or small bowel neuroendocrine tumor. 2. Large lesion in RIGHT hepatic lobe with intense radiotracer activity consistent with well differentiated neuroendocrine tumor metastasis. Smaller lesion in the anterior LEFT hepatic lobe is concerning for second hepatic metastasis. 3. Pulmonary nodule measuring 6 mm in LEFT upper lobe is not have radiotracer activity. Smaller RIGHT upper lobe nodule. Recommend close attention on follow-up.   08/01/2019 -  Chemotherapy   Lanreotide injection monthly starting 08/01/19    08/04/2019 Imaging  MRI abdomen  IMPRESSION: 1. The previously noted lesion in the right  lobe of the liver has grown slightly compared to 2015. In addition, today's study demonstrates 2 smaller lesions with similar imaging characteristics. Given the activity on the recent PET Dotatate scan, these lesions are all compatible with small neuroendocrine metastatic lesions. 2. Hepatic steatosis.   02/10/2020 Imaging   MRI abdomen  IMPRESSION: 1. The dominant right hepatic lobe mass has mildly increased in size compared to the prior exam. This currently measures 5.4 by 4.5 cm, previously 4.9 by 4.2 cm. 2. Nine additional small T2 hyperintense foci in the liver are stable, and while the small size makes these less specific, there likely small metastatic lesions. Today's exam has the benefit of less motion artifact compared to the 06/03/2020 MRI, and I was able to pick at each of these tiny lesions on the prior exam in retrospect. It is conceivable that 1 or more of these tiny lesions could represent small hemangiomas, although I do not observe classic delayed enhancement pattern. 3. Diffuse hepatic steatosis.      CURRENT THERAPY:  Lanreotide injection monthly starting 08/01/19  INTERVAL HISTORY:  Dwayne Sawyer is here for a follow up and treatment. Dwayne Sawyer presents to the clinic alone. Dwayne Sawyer notes to starting estrogen hormones and would like to continue being called Dwayne Sawyer as of now. Dwayne Sawyer notes with last injection having knot of right buttocks afterward. Dwayne Sawyer continues to have stable mouse-like loose stool with occasional diarrhea. This occurs at least 3 times a day and has occurred since surgery. Imodium use has not been required.    REVIEW OF SYSTEMS:   Constitutional: Denies fevers, chills or abnormal weight loss Eyes: Denies blurriness of vision Ears, nose, mouth, throat, and face: Denies mucositis or sore throat Respiratory: Denies cough, dyspnea or wheezes Cardiovascular: Denies palpitation, chest discomfort or lower extremity swelling Gastrointestinal:  Denies nausea,  heartburn (+) Stable loose stool with occasional diarrhea  Skin: Denies abnormal skin rashes Lymphatics: Denies new lymphadenopathy or easy bruising Neurological:Denies numbness, tingling or new weaknesses Behavioral/Psych: Mood is stable, no new changes  All other systems were reviewed with the patient and are negative.  MEDICAL HISTORY:  Past Medical History:  Diagnosis Date  . Arthritis   . Family history of carcinoid tumor   . High cholesterol   . Hypertension     SURGICAL HISTORY: Past Surgical History:  Procedure Laterality Date  . HERNIA REPAIR    . LAPAROTOMY N/A 06/12/2019   Procedure: EXPLORATORY LAPAROTOMY WITH SMALL BOWEL RESECTION;  Surgeon: Dwayne Hausen, MD;  Location: [facility];  Service: General;  Laterality: N/A;  . SHOULDER SURGERY      I have reviewed the social history and family history with the patient and they are unchanged from previous note.  ALLERGIES:  has No Known Allergies.  MEDICATIONS:  Current Outpatient Medications  Medication Sig Dispense Refill  . amLODipine (NORVASC) 10 MG tablet TAKE ONE TABLET BY MOUTH DAILY 90 tablet 1  . atorvastatin (LIPITOR) 10 MG tablet TAKE ONE TABLET BY MOUTH DAILY AT 6:00 IN THE EVENING 30 tablet 3  . valsartan-hydrochlorothiazide (DIOVAN-HCT) 160-25 MG tablet TAKE ONE TABLET BY MOUTH DAILY 90 tablet 1   No current facility-administered medications for this visit.   Facility-Administered Medications Ordered in Other Visits  Medication Dose Route Frequency Provider Last Rate Last Admin  . lanreotide acetate (SOMATULINE DEPOT) injection 120 mg  120 mg Subcutaneous Once Truitt Merle, MD        PHYSICAL  EXAMINATION: ECOG PERFORMANCE STATUS: 1 - Symptomatic but completely ambulatory  Vitals:   02/13/20 1015  BP: (!) 157/95  Pulse: 80  Resp: 20  Temp: 98.2 F (36.8 C)  SpO2: 99%   Filed Weights   02/13/20 1015  Weight: (!) 249 lb 1.6 oz (113 kg)    Due to COVID19 we will limit examination to appearance.  Patient had no complaints.  GENERAL:alert, no distress and comfortable SKIN: skin color normal, no rashes or significant lesions EYES: normal, Conjunctiva are pink and non-injected, sclera clear  NEURO: alert & oriented x 3 with fluent speech   LABORATORY DATA:  I have reviewed the data as listed CBC Latest Ref Rng & Units 02/06/2020 10/24/2019 08/01/2019  WBC 4.0 - 10.5 K/uL 8.1 7.6 7.7  Hemoglobin 13.0 - 17.0 g/dL 14.4 14.5 14.7  Hematocrit 39 - 52 % 43.9 44.8 45.1  Platelets 150 - 400 K/uL 339 324 353     CMP Latest Ref Rng & Units 02/06/2020 11/14/2019 10/24/2019  Glucose 70 - 99 mg/dL 236(H) 98 121(H)  BUN 6 - 20 mg/dL 20 17 22(H)  Creatinine 0.61 - 1.24 mg/dL 1.61(H) 1.46(H) 1.40(H)  Sodium 135 - 145 mmol/L 138 138 143  Potassium 3.5 - 5.1 mmol/L 3.8 4.1 4.0  Chloride 98 - 111 mmol/L 102 99 104  CO2 22 - 32 mmol/L 25 24 27   Calcium 8.9 - 10.3 mg/dL 9.9 9.8 9.6  Total Protein 6.5 - 8.1 g/dL 8.1 - 8.0  Total Bilirubin 0.3 - 1.2 mg/dL 0.6 - 0.6  Alkaline Phos 38 - 126 U/L 77 - 88  AST 15 - 41 U/L 15 - 13(L)  ALT 0 - 44 U/L 26 - 16      RADIOGRAPHIC STUDIES: I have personally reviewed the radiological images as listed and agreed with the findings in the report. No results found.   ASSESSMENT & PLAN:  Nyheem Binette is a 49 y.o. male with    1. 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 Pathology confirmed well differentiated neuroendocrine tumor, with metastasis to 14 of 32 LNs. Margins clear. -07/16/19 PETshowstworight hepatic lesion which areintensely hypermetabolic consistent with metastatic NET. There is also a smaller less avid left hepatic lesion that is concerning for metastasis which was not seen on CT from 06/11/19.No other evidence of mets -His MRI abdomen from 08/04/19 shows 3 liver lesions (0.8-4.4cm) compatible with neuroendocrine  metastatic lesions.  -He was seen by Dr. Barry Dienes and she did not recommend surgery at this time due to the multiple liver mets.  -I started him on monthly Lanreotide injections on 08/01/19. Will continue injections to treat and control his disease. If he responds well he may be able to do liver ablation down the road. I briefly discussed option treatment of Lutathera as a later line of treatment.  -I personally reviewed and discussed the MRI abdomen from 02/10/20 which shows mild progression of right liver lobe lesions and stable 9 additional T2 hyperintense foci in the liver some could be benign lesions. I discussed there is still stable disease per radiographic criteria, although this likely represents mild preogression. I recommend repeating DOTATATE PET for further evaluation as the next scan in 3 months.  -With further disease progression, I will recommend next line treatment with monthly Lutathera treatments X4 then proceeding with observation, or afinitor. I discussed Ephriam Knuckles is not curative, so with the following disease  progression I will discuss oral target therapy options at that time.  -Labs reviewed and adequate to proceed with lanreotide injections today. Continue monthly -f/u in 3 months with scan results.   3. RA -previously on methotrexate and enbrel -not currently on therapy   4. Mild Diarrhea  -Secondary to lanreotide injections  -Elizabeth has had loose mouse-like stool since surgery and has occasional diarrhea at least 3 times a day. Overall manageable.  -I recommend increased water intake and imodium if needed.   5. Uncontrolled hypertension, Hyperglycemia and CKD -Cr has been increasing, likely related to hyperglycemia and uncontrolled HTN.  -BG is 236 today and BP at 157/95 (02/13/20). I recommend following up with PCP for medication adjustment and to check A1c and Cholesterol as this can indicate Diabetes  6. Transgender and estrogen use -[place] has started estrogen with  Mollie Germany, NP with goal of transgender transitioning.  -I checked his neuroendocrine ER and PR which were negative, so there is no contraindication for estrogen use. Can continue    PLAN: -MRI scanreviewed -Proceed with injection today  -Lab and injection monthly X3 -f/u in 3 months with lab and DOTATATE PET a few days before  -will copy his PCP about his HTN issue    No problem-specific Assessment & Plan notes found for this encounter.   Orders Placed This Encounter  Procedures  . NM PET (NETSPOT GA 27 DOTATATE) SKULL BASE TO MID THIGH    Standing Status:   Future    Standing Expiration Date:   02/13/2021    Order Specific Question:   If indicated for the ordered procedure, I authorize the administration of a radiopharmaceutical per Radiology protocol    Answer:   Yes    Order Specific Question:   Preferred imaging location?    Answer:   [facility]    Order Specific Question:   Release to patient    Answer:   Immediate    Order Specific Question:   Radiology Contrast Protocol - do NOT remove file path    Answer:   [email or web address]   All questions were answered. The patient knows to call the clinic with any problems, questions or concerns. No barriers to learning was detected. The total time spent in the appointment was 30 minutes.     Truitt Merle, MD 02/13/2020   I, Joslyn Devon, am acting as scribe for Truitt Merle, MD.   I have reviewed the above documentation for accuracy and completeness, and I agree with the above.

## 2020-02-10 ENCOUNTER — Other Ambulatory Visit: Payer: Self-pay

## 2020-02-10 ENCOUNTER — Ambulatory Visit (HOSPITAL_COMMUNITY)
Admission: RE | Admit: 2020-02-10 | Discharge: 2020-02-10 | Disposition: A | Discharge status: Home / Self Care | Payer: Self-pay | Source: Ambulatory Visit | Attending: Hematology | Admitting: Hematology

## 2020-02-10 DIAGNOSIS — D3A098 Benign carcinoid tumors of other sites: Secondary | ICD-10-CM | POA: Insufficient documentation

## 2020-02-10 IMAGING — MR MR ABDOMEN WO/W CM
20 of 21 series · 47 of 48 positions shown · IV contrast (with contrast)
Comparison: Multiple exams, including MRI abdomen from [DATE]

CLINICAL DATA: Small bowel carcinoid tumor, laparotomy with
resection in [DATE], metastatic to the liver. The patient was
placed on monthly lanreotide injections in [DATE].

EXAM:
MRI ABDOMEN WITHOUT AND WITH CONTRAST
TECHNIQUE: Multiplanar multisequence MR imaging of the abdomen was performed
both before and after the administration of intravenous contrast.
CONTRAST:  10mL GADAVIST GADOBUTROL 1 MMOL/ML IV SOLN

[Series 2: haste_cor_mbh · coronal · 6.0mm · 1.64mm/px · 1 of 43 slices shown]
[im 1/43]
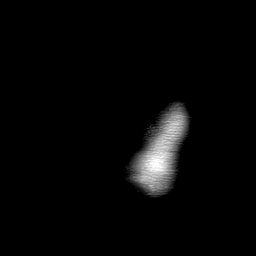

[Series 3: ax_trufi_mbh · axial · 6.0mm · 1.20mm/px · 1 of 50 slices shown]
[im 1/50]
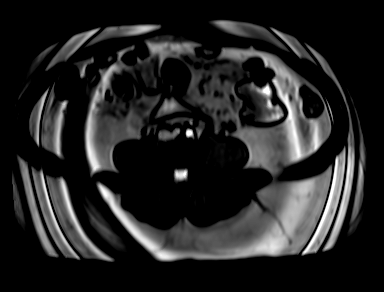

[Series 5: T2 fat-sat · axial · 6.0mm · 1.31mm/px · 1 of 42 slices shown]
[im 1/42]
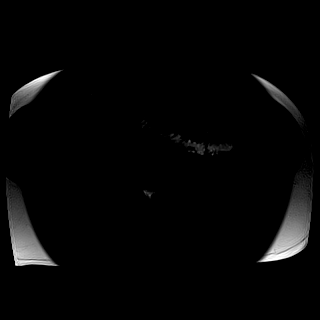

[Series 6: ax_diff_fb_tracew_dfc_mix · axial · 6.0mm · 1.72mm/px · z∈[-206,+132]mm · 2 of 96 slices shown]
[im 1/96]
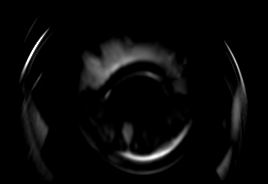
[im 96/96]
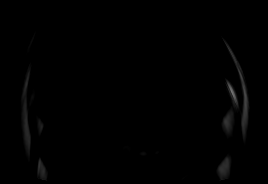

[Series 7: ax_diff_fb_adc_dfc_mix · axial · 6.0mm · 1.72mm/px · 1 of 48 slices shown]
[im 1/48]
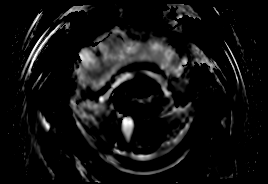

[Series 11: t1_vibe_e-dixon_tra_bh_pre_w · axial · 3.0mm · 2.05mm/px · z∈[-179,+106]mm · 2 of 96 slices shown]
[im 1/96]
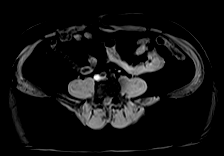
[im 96/96]
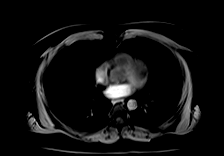

[Series 12: t1_vibe_e-dixon_tra_bh_pre_w_seg · axial · 3.0mm · 2.05mm/px · z∈[-179,+106]mm · 3 of 96 slices shown]
[im 1/96]
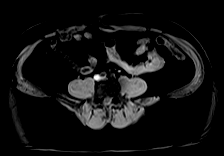
[im 48/96]
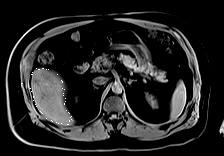
[im 96/96]
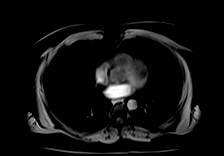

[Series 15: t1_vibe_e-dixon_tra_bh_pre_in_reg · axial · 3.0mm · 2.05mm/px · z∈[-179,+106]mm · 3 of 96 slices shown]
[im 1/96]
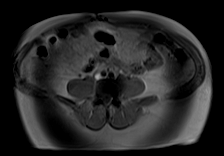
[im 48/96]
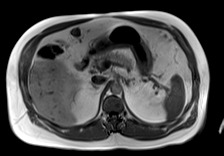
[im 96/96]
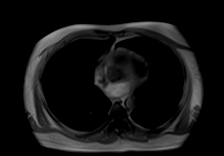

[Series 16: t1_vibe_e-dixon_tra_bh_pre_opp_reg · axial · 3.0mm · 2.05mm/px · z∈[-179,+106]mm · 3 of 96 slices shown]
[im 1/96]
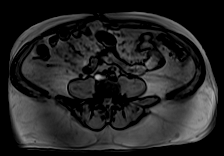
[im 48/96]
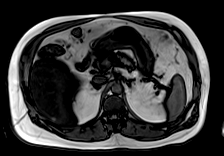
[im 96/96]
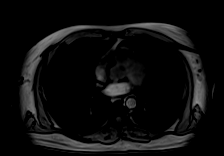

[Series 18: t1_vibe_e-dixon_tra_bh_pre_w_reg · axial · 3.0mm · 2.05mm/px · z∈[-179,+106]mm · 3 of 96 slices shown]
[im 1/96]
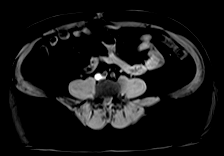
[im 48/96]
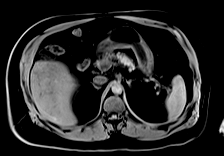
[im 96/96]
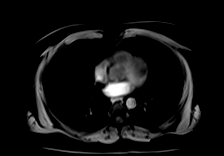

[Series 19: t1_vibe_dixon_tra_bh_arterial_w_reg · axial · 3.0mm · 2.05mm/px · z∈[-179,+106]mm · 3 of 96 slices shown]
[im 1/96]
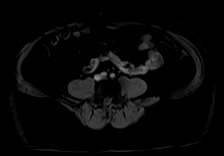
[im 48/96]
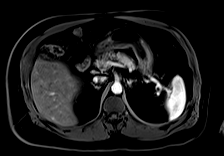
[im 96/96]
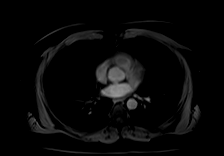

[Series 20: t1_vibe_dixon_tra_bh_arterial_w_sub · axial · 3.0mm · 2.05mm/px · z∈[-179,+106]mm · 3 of 96 slices shown]
[im 1/96]
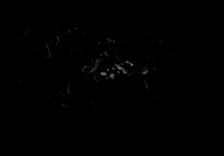
[im 48/96]
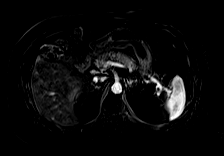
[im 96/96]
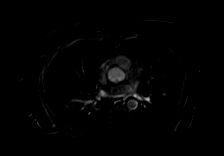

[Series 21: t1_vibe_dixon_tra_bh_venous_w_reg · axial · 3.0mm · 2.05mm/px · z∈[-179,+106]mm · 3 of 96 slices shown]
[im 1/96]
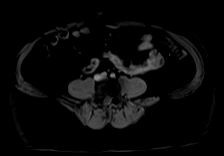
[im 48/96]
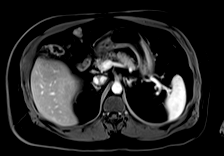
[im 96/96]
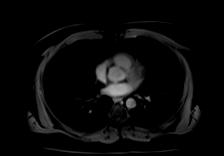

[Series 22: t1_vibe_dixon_tra_bh_venous_w_r_sub · axial · 3.0mm · 2.05mm/px · z∈[-179,+106]mm · 3 of 96 slices shown]
[im 1/96]
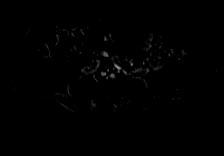
[im 48/96]
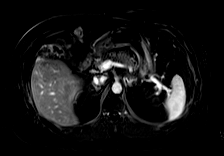
[im 96/96]
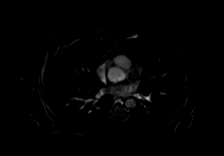

[Series 23: T2 · axial · 6.0mm · 1.64mm/px · 1 of 40 slices shown]
[im 1/40]
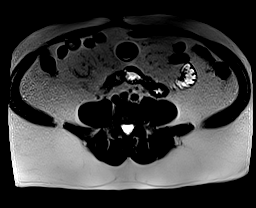

[Series 24: t1_vibe_dixon_tra_bh_delayed_w_reg · axial · 3.0mm · 2.05mm/px · z∈[-179,+106]mm · 3 of 96 slices shown]
[im 1/96]
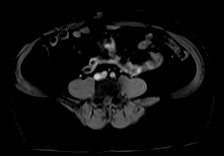
[im 48/96]
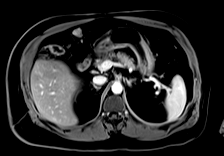
[im 96/96]
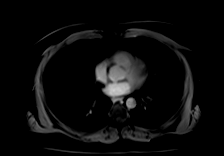

[Series 25: t1_vibe_dixon_tra_bh_delayed_w__sub · axial · 3.0mm · 2.05mm/px · z∈[-179,+106]mm · 3 of 96 slices shown]
[im 1/96]
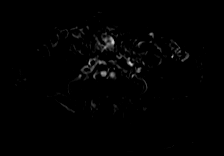
[im 48/96]
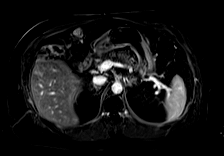
[im 96/96]
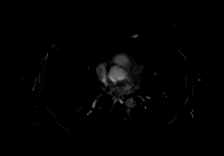

[Series 27: t1_vibe_dixon_cor_bh_post_w · coronal · 5.0mm · 2.34mm/px · 2 of 64 slices shown]
[im 1/64]
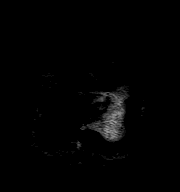
[im 64/64]
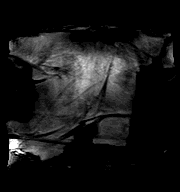

[Series 28: t1_vibe_dixon_tra_bh_3 min_w_reg · axial · 3.0mm · 2.05mm/px · z∈[-179,+106]mm · 3 of 96 slices shown]
[im 1/96]
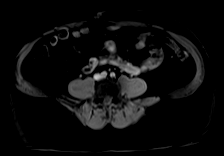
[im 48/96]
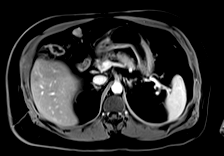
[im 96/96]
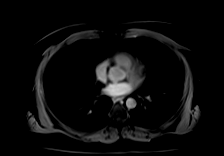

[Series 29: t1_vibe_dixon_tra_bh_3 min_w_re_sub · axial · 3.0mm · 2.05mm/px · z∈[-179,+106]mm · 3 of 96 slices shown]
[im 1/96]
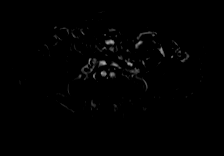
[im 48/96]
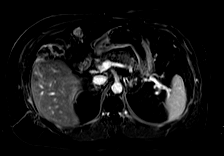
[im 96/96]
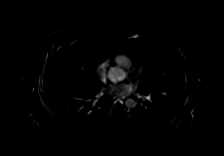

[47 of 48 positions shown; findings below may reference images not displayed]

FINDINGS: Lower chest: Unremarkable

Hepatobiliary: The dominant right hepatic lobe mass measures 5.4 by
4.5 cm on image [DATE], previously 4.9 by 4.2 cm. This mass
demonstrates T2 hyperintensity and T1 hypointensity with rim
enhancement and mild internal heterogeneity.

Nine additional small T2 hyperintense foci in the liver are again
identified and favor small metastatic lesions. The smaller lesions
are stable and generally under 1 cm in size.

The gallbladder appears unremarkable.  No biliary dilatation.

Diffuse hepatic steatosis.

Pancreas:  Unremarkable

Spleen:  Unremarkable

Adrenals/Urinary Tract: Tiny bilateral renal cysts. Adrenal glands
normal.

Stomach/Bowel: Unremarkable

Vascular/Lymphatic:  Unremarkable

Other:  No supplemental non-categorized findings.

Musculoskeletal: Unremarkable
IMPRESSION: 1. The dominant right hepatic lobe mass has mildly increased in size
compared to the prior exam. This currently measures 5.4 by 4.5 cm,
previously 4.9 by 4.2 cm.
2. Nine additional small T2 hyperintense foci in the liver are
stable, and while the small size makes these less specific, there
likely small metastatic lesions. Today's exam has the benefit of
less motion artifact compared to the [DATE] MRI, and I was able
to pick at each of these tiny lesions on the prior exam in
retrospect. It is conceivable that 1 or more of these tiny lesions
could represent small hemangiomas, although I do not observe classic
delayed enhancement pattern.
3. Diffuse hepatic steatosis.

## 2020-02-10 MED ORDER — GADOBUTROL 1 MMOL/ML IV SOLN
10.0000 mL | Freq: Once | INTRAVENOUS | Status: AC | PRN
  Administered 2020-02-10: 10 mL via INTRAVENOUS

## 2020-02-11 ENCOUNTER — Ambulatory Visit: Payer: Self-pay | Admitting: Family Medicine

## 2020-02-13 ENCOUNTER — Inpatient Hospital Stay (HOSPITAL_BASED_OUTPATIENT_CLINIC_OR_DEPARTMENT_OTHER): Payer: Self-pay | Admitting: Hematology

## 2020-02-13 ENCOUNTER — Inpatient Hospital Stay: Payer: Self-pay

## 2020-02-13 ENCOUNTER — Other Ambulatory Visit: Payer: Self-pay

## 2020-02-13 ENCOUNTER — Encounter: Payer: Self-pay | Admitting: Hematology

## 2020-02-13 VITALS — BP 157/95 | HR 80 | Temp 98.2°F | Resp 20 | Ht 69.0 in | Wt 249.1 lb

## 2020-02-13 DIAGNOSIS — D3A098 Benign carcinoid tumors of other sites: Secondary | ICD-10-CM

## 2020-02-13 MED ORDER — LANREOTIDE ACETATE 120 MG/0.5ML ~~LOC~~ SOLN
120.0000 mg | Freq: Once | SUBCUTANEOUS | Status: AC
  Administered 2020-02-13: 120 mg via SUBCUTANEOUS
  Filled 2020-02-13: qty 120

## 2020-02-13 NOTE — Progress Notes (Signed)
[employer] working with patient regarding Medicaid and [employer] for [place] per hospital notes.

## 2020-02-13 NOTE — Patient Instructions (Signed)
Lanreotide injection What is this medicine? LANREOTIDE (lan REE oh tide) is used to reduce blood levels of growth hormone in patients with a condition called acromegaly. It also works to slow or stop tumor growth in patients with neuroendocrine tumors and treat carcinoid syndrome. This medicine may be used for other purposes; ask your health care provider or pharmacist if you have questions. COMMON BRAND NAME(S): Somatuline Depot What should I tell my health care provider before I take this medicine? They need to know if you have any of these conditions:  diabetes  gallbladder disease  heart disease  kidney disease  liver disease  thyroid disease  an unusual or allergic reaction to lanreotide, other medicines, foods, dyes, or preservatives  pregnant or trying to get pregnant  breast-feeding How should I use this medicine? This medicine is for injection under the skin. It is given by a health care professional in a hospital or clinic setting. Contact your pediatrician or health care professional regarding the use of this medicine in children. Special care may be needed. Overdosage: If you think you have taken too much of this medicine contact a poison control center or emergency room at once. NOTE: This medicine is only for you. Do not share this medicine with others. What if I miss a dose? It is important not to miss your dose. Call your doctor or health care professional if you are unable to keep an appointment. What may interact with this medicine? This medicine may interact with the following medications:  bromocriptine  cyclosporine  certain medicines for blood pressure, heart disease, irregular heart beat  certain medicines for diabetes  quinidine  terfenadine This list may not describe all possible interactions. Give your health care provider a list of all the medicines, herbs, non-prescription drugs, or dietary supplements you use. Also tell them if you smoke,  drink alcohol, or use illegal drugs. Some items may interact with your medicine. What should I watch for while using this medicine? Tell your doctor or healthcare professional if your symptoms do not start to get better or if they get worse. Visit your doctor or health care professional for regular checks on your progress. Your condition will be monitored carefully while you are receiving this medicine. This medicine may increase blood sugar. Ask your healthcare provider if changes in diet or medicines are needed if you have diabetes. You may need blood work done while you are taking this medicine. Women should inform their doctor if they wish to become pregnant or think they might be pregnant. There is a potential for serious side effects to an unborn child. Talk to your health care professional or pharmacist for more information. Do not breast-feed an infant while taking this medicine or for 6 months after stopping it. This medicine has caused ovarian failure in some women. This medicine may interfere with the ability to have a child. Talk with your doctor or health care professional if you are concerned about your fertility. What side effects may I notice from receiving this medicine? Side effects that you should report to your doctor or health care professional as soon as possible:  allergic reactions like skin rash, itching or hives, swelling of the face, lips, or tongue  increased blood pressure  severe stomach pain  signs and symptoms of hgh blood sugar such as being more thirsty or hungry or having to urinate more than normal. You may also feel very tired or have blurry vision.  signs and symptoms of low blood   sugar such as feeling anxious; confusion; dizziness; increased hunger; unusually weak or tired; sweating; shakiness; cold; irritable; headache; blurred vision; fast heartbeat; loss of consciousness  unusually slow heartbeat Side effects that usually do not require medical  attention (report to your doctor or health care professional if they continue or are bothersome):  constipation  diarrhea  dizziness  headache  muscle pain  muscle spasms  nausea  pain, redness, or irritation at site where injected This list may not describe all possible side effects. Call your doctor for medical advice about side effects. You may report side effects to FDA at 1-800-FDA-1088. Where should I keep my medicine? This drug is given in a hospital or clinic and will not be stored at home. NOTE: This sheet is a summary. It may not cover all possible information. If you have questions about this medicine, talk to your doctor, pharmacist, or health care provider.  2020 Elsevier/Gold Standard (2018-04-18 09:13:08)  

## 2020-02-14 ENCOUNTER — Encounter: Payer: Self-pay | Admitting: Hematology

## 2020-02-16 ENCOUNTER — Telehealth: Payer: Self-pay | Admitting: Hematology

## 2020-02-16 LAB — 5 HIAA, QUANTITATIVE, URINE, 24 HOUR
5-HIAA, Ur: 3.9 mg/L
5-HIAA,Quant.,24 Hr Urine: 8 mg/24 hr (ref 0.0–14.9)
Total Volume: 2050

## 2020-02-16 NOTE — Telephone Encounter (Signed)
Scheduled per 7/23 los. Pt requested exactly 28 days for injectrion. Rn karen is aware. Pt is aware of appt times and date. Noted to give pt appt calendar.

## 2020-02-17 ENCOUNTER — Encounter: Payer: Self-pay | Admitting: Hematology

## 2020-02-19 ENCOUNTER — Other Ambulatory Visit: Payer: Self-pay

## 2020-02-19 ENCOUNTER — Ambulatory Visit: Payer: Self-pay | Attending: Family Medicine | Admitting: Family

## 2020-02-19 DIAGNOSIS — N183 Chronic kidney disease, stage 3 unspecified: Secondary | ICD-10-CM

## 2020-02-19 DIAGNOSIS — C7B02 Secondary carcinoid tumors of liver: Secondary | ICD-10-CM

## 2020-02-19 DIAGNOSIS — E785 Hyperlipidemia, unspecified: Secondary | ICD-10-CM

## 2020-02-19 DIAGNOSIS — I10 Essential (primary) hypertension: Secondary | ICD-10-CM

## 2020-02-19 DIAGNOSIS — Z131 Encounter for screening for diabetes mellitus: Secondary | ICD-10-CM

## 2020-02-19 MED ORDER — CARVEDILOL 3.125 MG PO TABS: 3.1250 mg | ORAL_TABLET | Freq: Two times a day (BID) | ORAL | 0 refills | Status: DC

## 2020-02-19 NOTE — Progress Notes (Signed)
Virtual Visit via Telephone Note  I connected with Dwayne Sawyer, on 02/19/2020 at 3:26 PM by telephone due to the COVID-19 pandemic and verified that I am speaking with the correct person using two identifiers.  Due to current restrictions/limitations of in-office visits due to the COVID-19 pandemic, this scheduled clinical appointment was converted to a telehealth visit.   Consent: I discussed the limitations, risks, security and privacy concerns of performing an evaluation and management service by telephone and the availability of in person appointments. I also discussed with the patient that there may be a patient responsible charge related to this service. The patient expressed understanding and agreed to proceed.  Location of Patient: Home  Location of Provider: [facility] [place]  Persons participating in Telemedicine visit: Rosbel Buckner Durene Fruits, NP Orlan Leavens, [place]  History of Present Illness: Dwayne Sawyer is a 49 year old male with history of bowel obstruction and carcinoid tumor of intestine producing obstruction who presents for chronic conditions follow-up.  1. HYPERTENSION FOLLOW-UP: Currently taking: see medication list Med Adherence: [x]  Yes    []  No  Medication side effects: []  Yes    [x]  No Adherence with salt restriction: [x]  Yes    []  No Exercise: Yes [x]  No []  Home Monitoring?: [x]  Yes, sometimes last on July 23 was 157/95 Monitoring Frequency: []  Yes    [x]  No Home BP results range: [x]  Yes    []  No Smoking []  Yes [x]  No  SOB? []  Yes    [x]  No Chest Pain?: []  Yes    [x]  No Leg swelling?: []  Yes    [x]  No Headaches?: []  Yes    [x]  No Dizziness? []  Yes    [x]  No Comments: Last visit 10/13/2019 with Dr. Chapman Fitch. During that encounter continued on Amlodipine and Valsartan-Hydrochlorothiazide. Last visit 11/14/2019 with clinical pharmacist. During that encounter continued on current regimen.   2. CHRONIC KIDNEY DISEASE FOLLOW-UP: Last  creatinine: 1.61 mg/dL on 02/06/2020 EST. GFR: 57 mL/min on 02/06/2020 BP by provider today:  BP Readings from Last 3 Encounters:  02/13/20 (!) 157/95  01/16/20 (!) 148/74  12/19/19 (!) 167/111  Taking ACEI/ARB? yes Followed by Nephrology? no  3. HYPERLIPIDEMIA FOLLOW-UP:  Med Adherence: [x]  Yes    []  No Medication side effects: []  Yes    [x]  No Muscle aches:  []  Yes    [x]  No Diet Adherence: [x]  Yes    []  No Comments: Last visit 10/13/2019 with Dr. Chapman Fitch. During that encounter continued on Atorvastatin.   Past Medical History:  Diagnosis Date  . Arthritis   . Family history of carcinoid tumor   . High cholesterol   . Hypertension    No Known Allergies  Current Outpatient Medications on File Prior to Visit  Medication Sig Dispense Refill  . amLODipine (NORVASC) 10 MG tablet TAKE ONE TABLET BY MOUTH DAILY 90 tablet 1  . atorvastatin (LIPITOR) 10 MG tablet TAKE ONE TABLET BY MOUTH DAILY AT 6:00 IN THE EVENING 30 tablet 3  . valsartan-hydrochlorothiazide (DIOVAN-HCT) 160-25 MG tablet TAKE ONE TABLET BY MOUTH DAILY 90 tablet 1   No current facility-administered medications on file prior to visit.    Observations/Objective: Alert and oriented x 3. Not in acute distress. Physical examination not completed as this is a telemedicine visit.  Assessment and Plan: 1. Essential hypertension: -Blood pressure uncontrolled.  -Continue Carvedilol and Valsartan-Hydrochlorothiazide as prescribed.  -Adding Carvedilol to regimen.  -Follow-up with in 4 weeks with clinical pharmacist for blood pressure check. Write  down your blood pressure readings each day and bring those results along with your home blood pressure monitor to your appointment. Counseled patient on potential side effects of adding medication such as but not limited to lightheadedness or dizziness. Should the patient experience any of these symptoms patient should discontinue medication immediately and notify provider or seek  medical attention immediately if severe. Patient verbalized understanding. -Counseled on blood pressure goal of less than 130/80, low-sodium, DASH diet, medication compliance, 150 minutes of moderate intensity exercise per week as tolerated. Discussed medication compliance, adverse effects. -Last CMP and CBC obtained 02/06/2020. -Follow-up with primary physician as scheduled or sooner if needed.  - carvedilol (COREG) 3.125 MG tablet; Take 1 tablet (3.125 mg total) by mouth 2 (two) times daily with a meal.  Dispense: 60 tablet; Refill: 0 - Amb Referral to Clinical Pharmacist  2. Stage 3 chronic kidney disease, unspecified whether stage 3a or 3b CKD: -Last CMP obtained 02/06/2020. -Counseled patient to remain hydrated and avoid nonsteroidal anti-inflammatory medication. -Follow-up with primary physician as scheduled or sooner if needed.  3. Hyperlipidemia, unspecified hyperlipidemia type: -Continue Atorvastatin 10 mg daily as prescribed. Refills available on file.  -Lipid panel to check cholesterol level. Patient reports he will come within the next week to have this done. Counseled patient to come to appointment fasting. -Patient with metastasis of carcinoid tumor to the liver therefore we will continue to monitor LFT's. Both AST and ALT normal on 02/06/2020. -Follow-up with primary physician as scheduled or sooner if needed. - Lipid panel; Future  4. Diabetes mellitus screening: -Last visit 02/13/2020 with Oncologist Dr. Burr Medico. During that encounter patient's blood sugar 236 with recommendation to check hemoglobin A1C at next primary care visit. Today patient reports he was non-fasting during that time. -Hemoglobin A1C to screen for pre-diabetes/diabetes. Patient reports he will come within the next week to have this done.   - Hemoglobin A1c; Future  5. 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  Follow Up Instructions: Follow-up in 4  weeks with clinical pharmacist. Follow-up with primary physician as scheduled.    Patient was given clear instructions to go to Emergency Department or return to medical center if symptoms don't improve, worsen, or new problems develop.The patient verbalized understanding.  I discussed the assessment and treatment plan with the patient. The patient was provided an opportunity to ask questions and all were answered. The patient agreed with the plan and demonstrated an understanding of the instructions.   The patient was advised to call back or seek an in-person evaluation if the symptoms worsen or if the condition fails to improve as anticipated.   I provided 20 minutes total of non-face-to-face time during this encounter including median intraservice time, reviewing previous notes, labs, imaging, medications, management and patient verbalized understanding.    Camillia Herter, NP  [facility] and [facility] [place], [place]   02/19/2020, 8:09 AMb

## 2020-02-19 NOTE — Patient Instructions (Signed)
Continue Amlodipine and Valsartan-Hydrochlorothiazide as prescribed. Adding Carvedilol. Follow-up in 4 weeks with clinical pharmacist for blood pressure check. Keep all scheduled appointments with primary physician. Lab on next week. Hypertension, Adult Hypertension is another name for high blood pressure. High blood pressure forces your heart to work harder to pump blood. This can cause problems over time. There are two numbers in a blood pressure reading. There is a top number (systolic) over a bottom number (diastolic). It is best to have a blood pressure that is below 120/80. Healthy choices can help lower your blood pressure, or you may need medicine to help lower it. What are the causes? The cause of this condition is not known. Some conditions may be related to high blood pressure. What increases the risk?  Smoking.  Having type 2 diabetes mellitus, high cholesterol, or both.  Not getting enough exercise or physical activity.  Being overweight.  Having too much fat, sugar, calories, or salt (sodium) in your diet.  Drinking too much alcohol.  Having long-term (chronic) kidney disease.  Having a family history of high blood pressure.  Age. Risk increases with age.  Race. You may be at higher risk if you are African American.  Gender. Men are at higher risk than women before age 52. After age 24, women are at higher risk than men.  Having obstructive sleep apnea.  Stress. What are the signs or symptoms?  High blood pressure may not cause symptoms. Very high blood pressure (hypertensive crisis) may cause: ? Headache. ? Feelings of worry or nervousness (anxiety). ? Shortness of breath. ? Nosebleed. ? A feeling of being sick to your stomach (nausea). ? Throwing up (vomiting). ? Changes in how you see. ? Very bad chest pain. ? Seizures. How is this treated?  This condition is treated by making healthy lifestyle changes, such as: ? Eating healthy foods. ? Exercising  more. ? Drinking less alcohol.  Your health care provider may prescribe medicine if lifestyle changes are not enough to get your blood pressure under control, and if: ? Your top number is above 130. ? Your bottom number is above 80.  Your personal target blood pressure may vary. Follow these instructions at home: Eating and drinking   If told, follow the DASH eating plan. To follow this plan: ? Fill one half of your plate at each meal with fruits and vegetables. ? Fill one fourth of your plate at each meal with whole grains. Whole grains include whole-wheat pasta, brown rice, and whole-grain bread. ? Eat or drink low-fat dairy products, such as skim milk or low-fat yogurt. ? Fill one fourth of your plate at each meal with low-fat (lean) proteins. Low-fat proteins include fish, chicken without skin, eggs, beans, and tofu. ? Avoid fatty meat, cured and processed meat, or chicken with skin. ? Avoid pre-made or processed food.  Eat less than 1,500 mg of salt each day.  Do not drink alcohol if: ? Your doctor tells you not to drink. ? You are pregnant, may be pregnant, or are planning to become pregnant.  If you drink alcohol: ? Limit how much you use to:  0-1 drink a day for women.  0-2 drinks a day for men. ? Be aware of how much alcohol is in your drink. In the [place] one drink equals one 12 oz bottle of beer (355 mL), one 5 oz glass of wine (148 mL), or one 1 oz glass of hard liquor (44 mL). Lifestyle   Work with your  doctor to stay at a healthy weight or to lose weight. Ask your doctor what the best weight is for you.  Get at least 30 minutes of exercise most days of the week. This may include walking, swimming, or biking.  Get at least 30 minutes of exercise that strengthens your muscles (resistance exercise) at least 3 days a week. This may include lifting weights or doing Pilates.  Do not use any products that contain nicotine or tobacco, such as cigarettes, e-cigarettes,  and chewing tobacco. If you need help quitting, ask your doctor.  Check your blood pressure at home as told by your doctor.  Keep all follow-up visits as told by your doctor. This is important. Medicines  Take over-the-counter and prescription medicines only as told by your doctor. Follow directions carefully.  Do not skip doses of blood pressure medicine. The medicine does not work as well if you skip doses. Skipping doses also puts you at risk for problems.  Ask your doctor about side effects or reactions to medicines that you should watch for. Contact a doctor if you:  Think you are having a reaction to the medicine you are taking.  Have headaches that keep coming back (recurring).  Feel dizzy.  Have swelling in your ankles.  Have trouble with your vision. Get help right away if you:  Get a very bad headache.  Start to feel mixed up (confused).  Feel weak or numb.  Feel faint.  Have very bad pain in your: ? Chest. ? Belly (abdomen).  Throw up more than once.  Have trouble breathing. Summary  Hypertension is another name for high blood pressure.  High blood pressure forces your heart to work harder to pump blood.  For most people, a normal blood pressure is less than 120/80.  Making healthy choices can help lower blood pressure. If your blood pressure does not get lower with healthy choices, you may need to take medicine. This information is not intended to replace advice given to you by your health care provider. Make sure you discuss any questions you have with your health care provider. Document Revised: 03/20/2018 Document Reviewed: 03/20/2018 Elsevier Patient Education  2020 [employer].

## 2020-03-12 ENCOUNTER — Inpatient Hospital Stay: Payer: Self-pay | Attending: Nurse Practitioner

## 2020-03-12 ENCOUNTER — Other Ambulatory Visit: Payer: Self-pay

## 2020-03-12 VITALS — BP 163/96 | HR 75 | Temp 98.6°F | Resp 18

## 2020-03-12 DIAGNOSIS — D3A098 Benign carcinoid tumors of other sites: Secondary | ICD-10-CM

## 2020-03-12 DIAGNOSIS — C7B02 Secondary carcinoid tumors of liver: Secondary | ICD-10-CM | POA: Insufficient documentation

## 2020-03-12 DIAGNOSIS — C7A019 Malignant carcinoid tumor of the small intestine, unspecified portion: Secondary | ICD-10-CM | POA: Insufficient documentation

## 2020-03-12 MED ORDER — LANREOTIDE ACETATE 120 MG/0.5ML ~~LOC~~ SOLN
120.0000 mg | Freq: Once | SUBCUTANEOUS | Status: AC
  Administered 2020-03-12: 120 mg via SUBCUTANEOUS
  Filled 2020-03-12: qty 120

## 2020-03-16 ENCOUNTER — Other Ambulatory Visit: Payer: Self-pay | Admitting: Family

## 2020-03-16 DIAGNOSIS — I10 Essential (primary) hypertension: Secondary | ICD-10-CM

## 2020-04-09 ENCOUNTER — Inpatient Hospital Stay: Payer: Self-pay | Attending: Nurse Practitioner

## 2020-04-09 ENCOUNTER — Other Ambulatory Visit: Payer: Self-pay

## 2020-04-09 VITALS — BP 153/96 | HR 92 | Temp 98.1°F | Resp 18

## 2020-04-09 DIAGNOSIS — C7A019 Malignant carcinoid tumor of the small intestine, unspecified portion: Secondary | ICD-10-CM | POA: Insufficient documentation

## 2020-04-09 DIAGNOSIS — C7B02 Secondary carcinoid tumors of liver: Secondary | ICD-10-CM | POA: Insufficient documentation

## 2020-04-09 DIAGNOSIS — D3A098 Benign carcinoid tumors of other sites: Secondary | ICD-10-CM

## 2020-04-09 MED ORDER — LANREOTIDE ACETATE 120 MG/0.5ML ~~LOC~~ SOLN
120.0000 mg | Freq: Once | SUBCUTANEOUS | Status: AC
  Administered 2020-04-09: 120 mg via SUBCUTANEOUS
  Filled 2020-04-09: qty 120

## 2020-04-09 NOTE — Patient Instructions (Signed)
Lanreotide injection What is this medicine? LANREOTIDE (lan REE oh tide) is used to reduce blood levels of growth hormone in patients with a condition called acromegaly. It also works to slow or stop tumor growth in patients with neuroendocrine tumors and treat carcinoid syndrome. This medicine may be used for other purposes; ask your health care provider or pharmacist if you have questions. COMMON BRAND NAME(S): Somatuline Depot What should I tell my health care provider before I take this medicine? They need to know if you have any of these conditions:  diabetes  gallbladder disease  heart disease  kidney disease  liver disease  thyroid disease  an unusual or allergic reaction to lanreotide, other medicines, foods, dyes, or preservatives  pregnant or trying to get pregnant  breast-feeding How should I use this medicine? This medicine is for injection under the skin. It is given by a health care professional in a hospital or clinic setting. Contact your pediatrician or health care professional regarding the use of this medicine in children. Special care may be needed. Overdosage: If you think you have taken too much of this medicine contact a poison control center or emergency room at once. NOTE: This medicine is only for you. Do not share this medicine with others. What if I miss a dose? It is important not to miss your dose. Call your doctor or health care professional if you are unable to keep an appointment. What may interact with this medicine? This medicine may interact with the following medications:  bromocriptine  cyclosporine  certain medicines for blood pressure, heart disease, irregular heart beat  certain medicines for diabetes  quinidine  terfenadine This list may not describe all possible interactions. Give your health care provider a list of all the medicines, herbs, non-prescription drugs, or dietary supplements you use. Also tell them if you smoke,  drink alcohol, or use illegal drugs. Some items may interact with your medicine. What should I watch for while using this medicine? Tell your doctor or healthcare professional if your symptoms do not start to get better or if they get worse. Visit your doctor or health care professional for regular checks on your progress. Your condition will be monitored carefully while you are receiving this medicine. This medicine may increase blood sugar. Ask your healthcare provider if changes in diet or medicines are needed if you have diabetes. You may need blood work done while you are taking this medicine. Women should inform their doctor if they wish to become pregnant or think they might be pregnant. There is a potential for serious side effects to an unborn child. Talk to your health care professional or pharmacist for more information. Do not breast-feed an infant while taking this medicine or for 6 months after stopping it. This medicine has caused ovarian failure in some women. This medicine may interfere with the ability to have a child. Talk with your doctor or health care professional if you are concerned about your fertility. What side effects may I notice from receiving this medicine? Side effects that you should report to your doctor or health care professional as soon as possible:  allergic reactions like skin rash, itching or hives, swelling of the face, lips, or tongue  increased blood pressure  severe stomach pain  signs and symptoms of hgh blood sugar such as being more thirsty or hungry or having to urinate more than normal. You may also feel very tired or have blurry vision.  signs and symptoms of low blood   sugar such as feeling anxious; confusion; dizziness; increased hunger; unusually weak or tired; sweating; shakiness; cold; irritable; headache; blurred vision; fast heartbeat; loss of consciousness  unusually slow heartbeat Side effects that usually do not require medical  attention (report to your doctor or health care professional if they continue or are bothersome):  constipation  diarrhea  dizziness  headache  muscle pain  muscle spasms  nausea  pain, redness, or irritation at site where injected This list may not describe all possible side effects. Call your doctor for medical advice about side effects. You may report side effects to FDA at 1-800-FDA-1088. Where should I keep my medicine? This drug is given in a hospital or clinic and will not be stored at home. NOTE: This sheet is a summary. It may not cover all possible information. If you have questions about this medicine, talk to your doctor, pharmacist, or health care provider.  2020 Elsevier/Gold Standard (2018-04-18 09:13:08)  

## 2020-05-07 ENCOUNTER — Inpatient Hospital Stay: Payer: Self-pay | Attending: Nurse Practitioner

## 2020-05-07 ENCOUNTER — Other Ambulatory Visit: Payer: Self-pay

## 2020-05-07 VITALS — BP 155/100 | HR 85 | Temp 99.0°F | Resp 18

## 2020-05-07 DIAGNOSIS — C7A011 Malignant carcinoid tumor of the jejunum: Secondary | ICD-10-CM | POA: Insufficient documentation

## 2020-05-07 DIAGNOSIS — C7B02 Secondary carcinoid tumors of liver: Secondary | ICD-10-CM | POA: Insufficient documentation

## 2020-05-07 DIAGNOSIS — D3A098 Benign carcinoid tumors of other sites: Secondary | ICD-10-CM

## 2020-05-07 MED ORDER — LANREOTIDE ACETATE 120 MG/0.5ML ~~LOC~~ SOLN
120.0000 mg | Freq: Once | SUBCUTANEOUS | Status: AC
  Administered 2020-05-07: 120 mg via SUBCUTANEOUS
  Filled 2020-05-07: qty 120

## 2020-05-12 ENCOUNTER — Inpatient Hospital Stay: Payer: Self-pay

## 2020-05-12 ENCOUNTER — Other Ambulatory Visit: Payer: Self-pay

## 2020-05-12 DIAGNOSIS — D3A098 Benign carcinoid tumors of other sites: Secondary | ICD-10-CM

## 2020-05-12 LAB — CBC WITH DIFFERENTIAL (CANCER CENTER ONLY)
Abs Immature Granulocytes: 0.02 10*3/uL (ref 0.00–0.07)
Basophils Absolute: 0.1 10*3/uL (ref 0.0–0.1)
Basophils Relative: 1 %
Eosinophils Absolute: 0.3 10*3/uL (ref 0.0–0.5)
Eosinophils Relative: 4 %
HCT: 42.6 % (ref 39.0–52.0)
Hemoglobin: 13.8 g/dL (ref 13.0–17.0)
Immature Granulocytes: 0 %
Lymphocytes Relative: 21 %
Lymphs Abs: 1.5 10*3/uL (ref 0.7–4.0)
MCH: 28.1 pg (ref 26.0–34.0)
MCHC: 32.4 g/dL (ref 30.0–36.0)
MCV: 86.8 fL (ref 80.0–100.0)
Monocytes Absolute: 0.7 10*3/uL (ref 0.1–1.0)
Monocytes Relative: 10 %
Neutro Abs: 4.7 10*3/uL (ref 1.7–7.7)
Neutrophils Relative %: 64 %
Platelet Count: 284 10*3/uL (ref 150–400)
RBC: 4.91 MIL/uL (ref 4.22–5.81)
RDW: 13.2 % (ref 11.5–15.5)
WBC Count: 7.2 10*3/uL (ref 4.0–10.5)
nRBC: 0 % (ref 0.0–0.2)

## 2020-05-12 LAB — CMP (CANCER CENTER ONLY)
ALT: 29 U/L (ref 0–44)
AST: 16 U/L (ref 15–41)
Albumin: 4 g/dL (ref 3.5–5.0)
Alkaline Phosphatase: 58 U/L (ref 38–126)
Anion gap: 8 (ref 5–15)
BUN: 20 mg/dL (ref 6–20)
CO2: 27 mmol/L (ref 22–32)
Calcium: 9.3 mg/dL (ref 8.9–10.3)
Chloride: 105 mmol/L (ref 98–111)
Creatinine: 1.08 mg/dL (ref 0.61–1.24)
GFR, Estimated: >60 mL/min (ref >60)
Glucose, Bld: 126 mg/dL — ABNORMAL HIGH (ref 70–99)
Potassium: 4.3 mmol/L (ref 3.5–5.1)
Sodium: 140 mmol/L (ref 135–145)
Total Bilirubin: 0.4 mg/dL (ref 0.3–1.2)
Total Protein: 7.6 g/dL (ref 6.5–8.1)

## 2020-05-13 ENCOUNTER — Telehealth: Payer: Self-pay

## 2020-05-13 LAB — CHROMOGRANIN A: Chromogranin A (ng/mL): 48.6 ng/mL (ref 0.0–101.8)

## 2020-05-13 NOTE — Telephone Encounter (Signed)
I spoke with Dwayne Sawyer and reviewed date and time of PET scan.  Rescheduled his f/u with Dr Burr Medico.

## 2020-05-14 ENCOUNTER — Inpatient Hospital Stay: Payer: Self-pay | Admitting: Hematology

## 2020-05-19 ENCOUNTER — Ambulatory Visit (HOSPITAL_COMMUNITY): Admission: RE | Admit: 2020-05-19 | Payer: Self-pay | Source: Ambulatory Visit

## 2020-05-20 ENCOUNTER — Telehealth: Payer: Self-pay | Admitting: Hematology

## 2020-05-20 NOTE — Telephone Encounter (Signed)
R/s appt per 10/27 sch msg - pt is aware of new appt on 11/11

## 2020-05-21 ENCOUNTER — Inpatient Hospital Stay: Payer: Self-pay | Admitting: Hematology

## 2020-05-31 ENCOUNTER — Ambulatory Visit (HOSPITAL_COMMUNITY)
Admission: RE | Admit: 2020-05-31 | Discharge: 2020-05-31 | Disposition: A | Discharge status: Home / Self Care | Payer: Self-pay | Source: Ambulatory Visit | Attending: Hematology | Admitting: Hematology

## 2020-05-31 DIAGNOSIS — D3A098 Benign carcinoid tumors of other sites: Secondary | ICD-10-CM | POA: Insufficient documentation

## 2020-05-31 IMAGING — PT NM PET SKULL BASE TO THIGH
9 series · 25 of 25 positions shown · non-contrast
Comparison: [DATE], DOTATATE PET scan

CLINICAL DATA: Restaging carcinoid tumor. Small-bowel resection
[3F]. Lanreotide injections ongoing.

EXAM:
NUCLEAR MEDICINE PET SKULL BASE TO THIGH
TECHNIQUE: 5.0 mCi Ga 68/Cu 64 DOTATATE was injected intravenously. Full-ring
PET imaging was performed from the skull base to thigh after the
radiotracer. CT data was obtained and used for attenuation
correction and anatomic localization.

[Series 3: pet sk_thigh ac · axial · 5.0mm · 4.07mm/px · z∈[-1158,-178]mm · 5 of 246 slices shown]
[im 1/246]
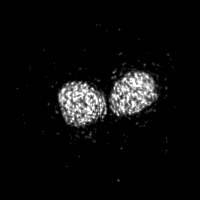
[im 62/246]
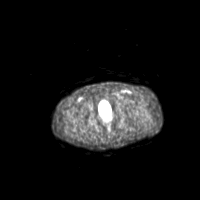
[im 123/246]
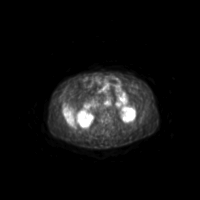
[im 184/246]
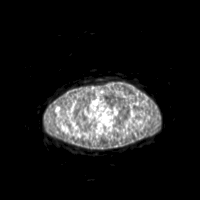
[im 246/246]
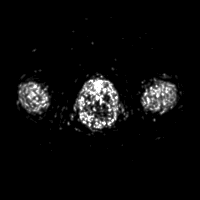

[Series 4: ct sk_thigh 5.0 hd_fov · axial · 5.0mm · 1.27mm/px · z∈[-1158,-178]mm · 5 of 246 slices shown]
[im 1/246]
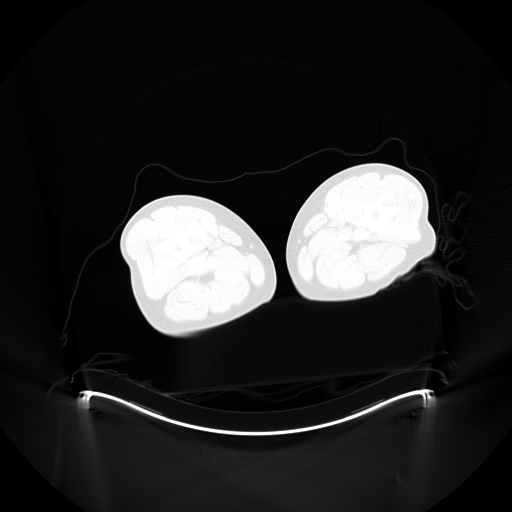
[im 62/246]
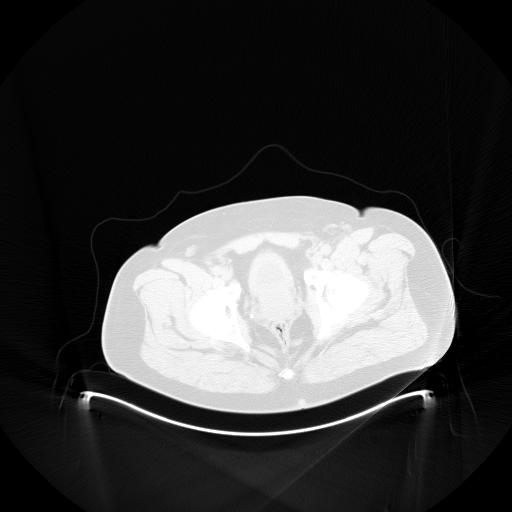
[im 123/246]
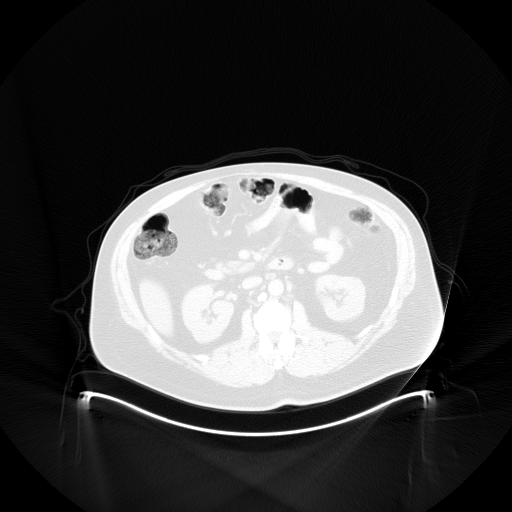
[im 184/246]
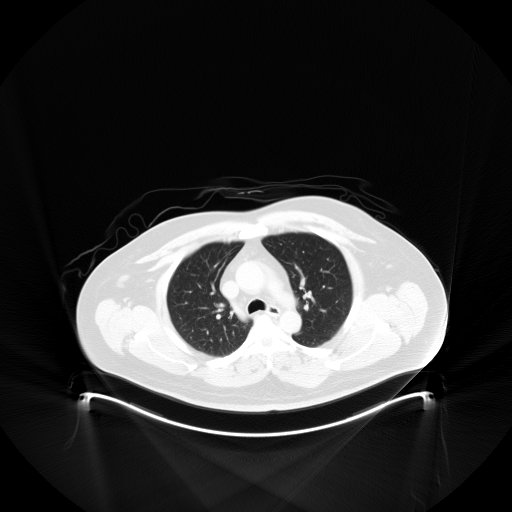
[im 246/246]
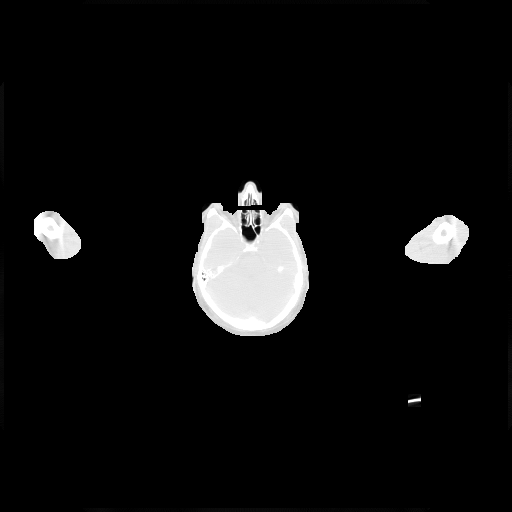

[Series 5: pet sk_thigh nac · axial · 5.0mm · 4.07mm/px · z∈[-1158,-178]mm · 5 of 246 slices shown]
[im 1/246]
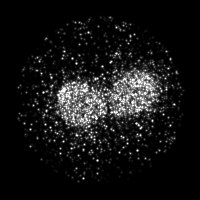
[im 62/246]
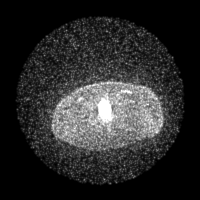
[im 123/246]
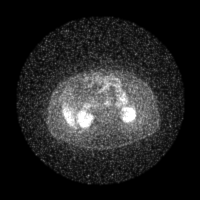
[im 184/246]
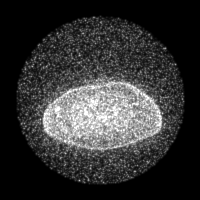
[im 246/246]
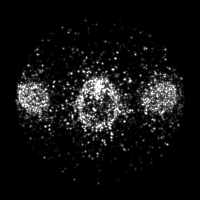

[Series 8: ct sk_thigh 5.0 br59 lung_bone · axial · 5.0mm · 0.78mm/px · 1 of 67 slices shown]
[im 1/67]
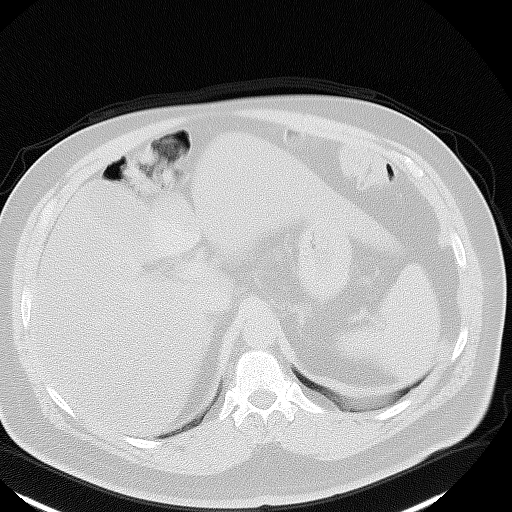

[Series 603: <mip collection> · coronal · 2.03mm/px · 1 of 32 slices shown]
[im 1/32]
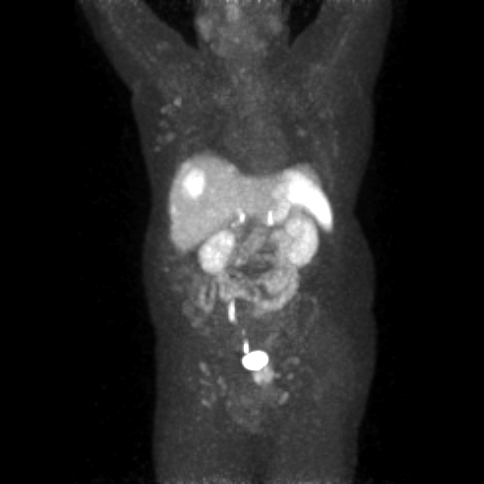

[Series 604: range-ct sk_thigh 5.0 hd_fov-tra-<alpha range> · 5 of 238 slices shown]
[im 1/238]
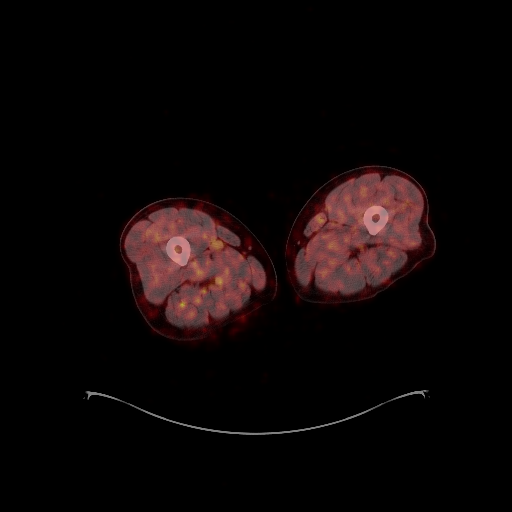
[im 60/238]
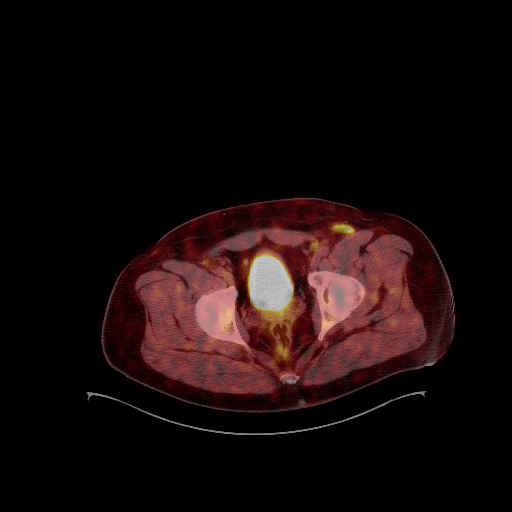
[im 119/238]
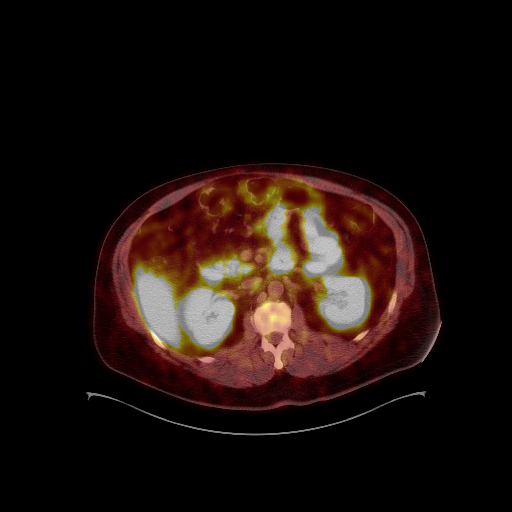
[im 178/238]
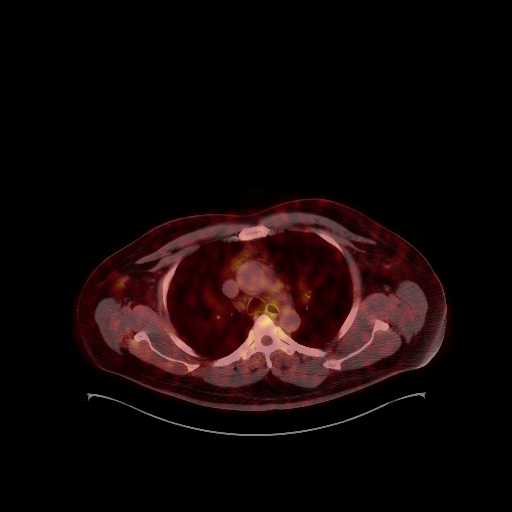
[im 238/238]
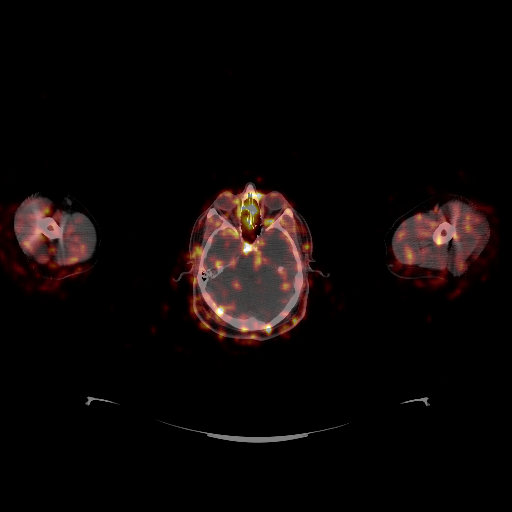

[Series 605: fused cor · 1 of 49 slices shown]
[im 1/49]
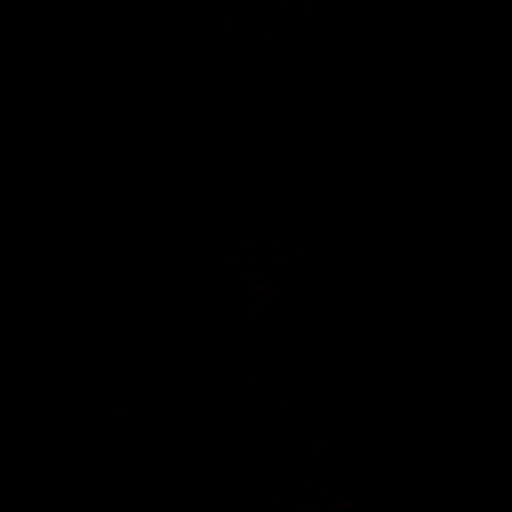

[Series 1269: results mm oncology reading · 1.3mm · 1.55mm/px · 1 of 3 slices shown (1 of 2)]
[im 1/3]
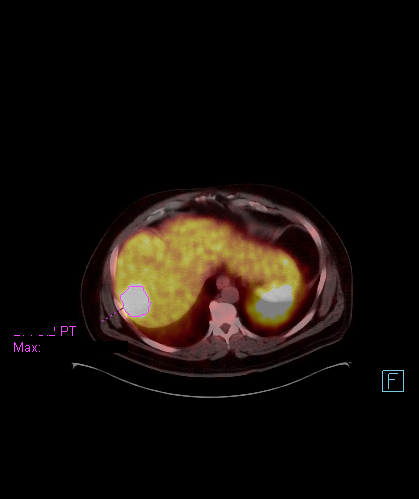

[Series 1509: results mm oncology reading · 1.3mm · 1.19mm/px · 1 of 1 slices shown (2 of 2)]
[im 1/1]
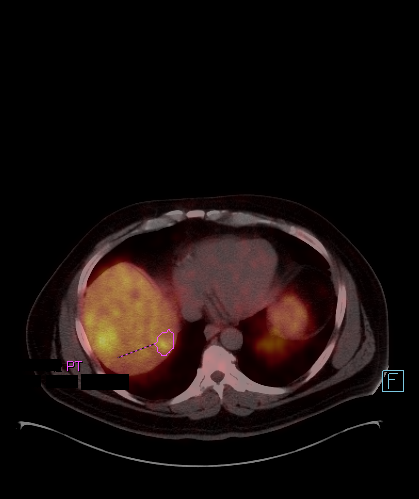

[25 of 25 positions shown; findings below may reference images not displayed]

FINDINGS: NECK

No radiotracer activity in neck lymph nodes.

Incidental CT findings: None

CHEST

Small 4 mm nodule in the RIGHT lower lobe (image 71) does not have
radiotracer activity. No abnormal mediastinal lymph nodes. Mild
activity associated with RIGHT axillary lymph nodes may relate to
recent COVID vaccination.

Incidental CT finding:None

ABDOMEN/PELVIS

Intense radiotracer activity within the dominant RIGHT hepatic lobe
lesion with SUV max equal 36 increased from SUV max equal 21 on
DOTATATE PET scan [DATE]. The hepatic metastatic lesions are
more conspicuous on the noncontrast CT scan as there is increased
hepatic steatosis which contrasts with the hyperdense lesions. For
example a dominant lesion in the RIGHT hepatic lobe measures 5.8 by
4.6 cm which compares to 4.6 by 3.4 cm on MRI [DATE] for
increase in size. Several smaller lesion now evident on the
noncontrast CT including 10 mm lesion in the central LEFT hepatic
lobe on image 101/series 4, and posterior RIGHT hepatic lobe lesion
measuring 12 mm image 88. These lesions also have radiotracer
activity which is increased from prior. No clear new lesions are
present however again the smaller lesions are more conspicuous on
current exam.

There is a new focus of radiotracer activity localizes to the head
of the pancreas with SUV max equal 19.5. This lesion is small
andless than 1 cm however difficult define on noncontrast CT. Lesion
is present on image 115 fused data set.

No additional abnormal radiotracer in the abdomen pelvis.

Activity within the inguinal nodes is favored reactive.

Physiologic activity noted in the liver, spleen, adrenal glands and
kidneys.

Incidental CT findings:None

SKELETON

No focal activity to suggest skeletal metastasis.

Incidental CT findings:None
IMPRESSION: 1. Progression of well differentiated neuroendocrine tumor hepatic
metastasis with increase in size and radiotracer activity of
dominant lesion in the LEFT hepatic lobe and multiple small lesions
as described above.
2. New small focus of radiotracer activity within the head of the
pancreas.
3. Increased hepatic steatosis.

## 2020-05-31 MED ORDER — GALLIUM GA 68 DOTATATE IV KIT
5.0000 | PACK | Freq: Once | INTRAVENOUS | Status: AC | PRN
  Administered 2020-05-31: 5.1 via INTRAVENOUS

## 2020-06-02 NOTE — Progress Notes (Signed)
[place]   [phone] [phone]   Clinic Follow up Note   Patient Care Team: Antony Blackbird, MD as PCP - General (Family Medicine)  Date of Service:  06/03/2020  CHIEF COMPLAINT: F/u neuroendocrine tumor  SUMMARY OF ONCOLOGIC HISTORY: Oncology History Overview Note  Cancer Staging No matching staging information was found for the patient.    Carcinoid tumor of small intestine   06/11/2019 Imaging   US Abdomen 06/11/19  IMPRESSION: 1. There is a solid mass in the right lobe of the liver which based on current measurements may have enlarged since the prior study from 2015. Images from the previous MR at that time cannot be retrieved. Given this circumstance, it may be prudent to correlate with pre and serial post-contrast MR or CT of the liver to further evaluate. There is underlying diffuse increase in liver echogenicity, a finding indicative of hepatic steatosis.   2. Multiple loops of fluid-filled bowel. Question a degree of ileus or enteritis.   3. Study otherwise unremarkable. Note that much of the common bile duct is obscured by gas.    06/11/2019 Imaging   CT AP W Contrast 06/11/19   IMPRESSION: 1. High-grade small bowel obstruction secondary to desmoplastic response in the central right abdominal mesentery centered on a 4 cm calcified irregular/spiculated soft tissue lesion. Abrupt small bowel transition zone is identified immediately lead adjacent to this mesenteric lesion and multiple adjacent bowel loops are tethered into this region with mesenteric edema/congestion. The loop of small bowel immediately proximal to the transition zone shows mild circumferential wall thickening but no pneumatosis. 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 2. Small lymph nodes in the abnormal right mesentery suggest additional metastatic involvement. 3. Heterogeneous  liver parenchyma, likely secondary to geographic fatty deposition. There is a focal 14 mm low-density lesion in the right liver. The patient had an MRI in the [facility] on 08/09/2013 to evaluate a right liver lesion, but those images are not available. Follow-up MRI without and with contrast recommended to exclude metastatic disease. 4. Tiny sclerotic foci in the T12 vertebral body in both femoral heads are likely benign, but close attention on follow-up recommended. 5.  Aortic Atherosclerois (ICD10-170.0)      06/12/2019 Surgery    EXPLORATORY LAPAROTOMY WITH SMALL BOWEL RESECTION by Dr. 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 Interval resection small bowel and mesenteric mass with no evidence residual mesenteric or small bowel neuroendocrine tumor. 2. Large lesion in RIGHT hepatic lobe with intense radiotracer activity consistent with well differentiated neuroendocrine tumor metastasis. Smaller lesion in the anterior LEFT hepatic lobe is concerning for second hepatic metastasis. 3. Pulmonary nodule measuring 6 mm in LEFT upper lobe is not have radiotracer activity. Smaller RIGHT upper lobe nodule. Recommend close attention on follow-up.   08/01/2019 -  Chemotherapy   Lanreotide injection monthly starting 08/01/19    08/04/2019 Imaging   MRI abdomen  IMPRESSION: 1. The previously noted lesion in the right lobe of the liver has grown  slightly compared to 2015. In addition, today's study demonstrates 2 smaller lesions with similar imaging characteristics. Given the activity on the recent PET Dotatate scan, these lesions are all compatible with small neuroendocrine metastatic lesions. 2. Hepatic steatosis.   02/10/2020 Imaging   MRI abdomen  IMPRESSION: 1. The dominant right hepatic lobe mass has mildly increased in size compared to the prior exam. This currently measures 5.4 by 4.5 cm, previously 4.9 by 4.2 cm. 2. Nine additional small T2 hyperintense foci in the liver are stable, and while the small size makes these less specific, there likely small metastatic lesions. Today's exam has the benefit of less motion artifact compared to the 06/03/2020 MRI, and I was able to pick at each of these tiny lesions on the prior exam in retrospect. It is conceivable that 1 or more of these tiny lesions could represent small hemangiomas, although I do not observe classic delayed enhancement pattern. 3. Diffuse hepatic steatosis.   05/31/2020 PET scan   DOTATATE PET  IMPRESSION: 1. Progression of well differentiated neuroendocrine tumor hepatic metastasis with increase in size and radiotracer activity of dominant lesion in the LEFT hepatic lobe and multiple small lesions as described above. 2. New small focus of radiotracer activity within the head of the pancreas. 3. Increased hepatic steatosis.        CURRENT THERAPY:  Lanreotide injection monthly starting 08/01/19  INTERVAL HISTORY:  Dwayne Sawyer is here for a follow up. He presents to the clinic alone. He is doing well overall He has soft/loose BM, 2-3 times a day, no abdominal pain or bloating  Flushing once a while  Eating good, weight stable  NO other concerns  All other systems were reviewed with the patient and are negative.  MEDICAL HISTORY:  Past Medical History:  Diagnosis Date  . Arthritis   . Family history of carcinoid tumor   . High  cholesterol   . Hypertension     SURGICAL HISTORY: Past Surgical History:  Procedure Laterality Date  . HERNIA REPAIR    . LAPAROTOMY N/A 06/12/2019   Procedure: EXPLORATORY LAPAROTOMY WITH SMALL BOWEL RESECTION;  Surgeon: Johnathan Hausen, MD;  Location: [facility];  Service: General;  Laterality: N/A;  . SHOULDER SURGERY      I have reviewed the social history and family history with the patient and they are unchanged from previous note.  ALLERGIES:  has No Known Allergies.  MEDICATIONS:  Current Outpatient Medications  Medication Sig Dispense Refill  . amLODipine (NORVASC) 10 MG tablet TAKE ONE TABLET BY MOUTH DAILY 90 tablet 1  . atorvastatin (LIPITOR) 10 MG tablet TAKE ONE TABLET BY MOUTH DAILY AT 6:00 IN THE EVENING 30 tablet 3  . carvedilol (COREG) 3.125 MG tablet Take 1 tablet (3.125 mg total) by mouth 2 (two) times daily with a meal. 60 tablet 0  . valsartan-hydrochlorothiazide (DIOVAN-HCT) 160-25 MG tablet TAKE ONE TABLET BY MOUTH DAILY 90 tablet 1   No current facility-administered medications for this visit.    PHYSICAL EXAMINATION: ECOG PERFORMANCE STATUS: 0 - Asymptomatic  Vitals:   06/03/20 0853  BP: (!) 160/102  Pulse: 87  Resp: 17  Temp: 98.4 F (36.9 C)  SpO2: 98%   Filed Weights   06/03/20 0853  Weight: 251 lb 4.8 oz (114 kg)    GENERAL:alert, no distress and comfortable SKIN: skin color, texture, turgor are normal, no rashes or significant lesions EYES:  normal, Conjunctiva are pink and non-injected, sclera clear NECK: supple, thyroid normal size, non-tender, without nodularity LYMPH:  no palpable lymphadenopathy in the cervical, axillary  LUNGS: clear to auscultation and percussion with normal breathing effort HEART: regular rate & rhythm and no murmurs and no lower extremity edema ABDOMEN:abdomen soft, non-tender and normal bowel sounds Musculoskeletal:no cyanosis of digits and no clubbing  NEURO: alert & oriented x 3 with fluent speech, no focal  motor/sensory deficits  LABORATORY DATA:  I have reviewed the data as listed CBC Latest Ref Rng & Units 05/12/2020 02/06/2020 10/24/2019  WBC 4.0 - 10.5 K/uL 7.2 8.1 7.6  Hemoglobin 13.0 - 17.0 g/dL 13.8 14.4 14.5  Hematocrit 39 - 52 % 42.6 43.9 44.8  Platelets 150 - 400 K/uL 284 339 324     CMP Latest Ref Rng & Units 05/12/2020 02/06/2020 11/14/2019  Glucose 70 - 99 mg/dL 126(H) 236(H) 98  BUN 6 - 20 mg/dL 20 20 17   Creatinine 0.61 - 1.24 mg/dL 1.08 1.61(H) 1.46(H)  Sodium 135 - 145 mmol/L 140 138 138  Potassium 3.5 - 5.1 mmol/L 4.3 3.8 4.1  Chloride 98 - 111 mmol/L 105 102 99  CO2 22 - 32 mmol/L 27 25 24   Calcium 8.9 - 10.3 mg/dL 9.3 9.9 9.8  Total Protein 6.5 - 8.1 g/dL 7.6 8.1 -  Total Bilirubin 0.3 - 1.2 mg/dL 0.4 0.6 -  Alkaline Phos 38 - 126 U/L 58 77 -  AST 15 - 41 U/L 16 15 -  ALT 0 - 44 U/L 29 26 -      RADIOGRAPHIC STUDIES: I have personally reviewed the radiological images as listed and agreed with the findings in the report. No results found.   ASSESSMENT & PLAN:  Dwayne Sawyer is a 49 y.o. male with   1. 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 Pathology confirmed well differentiated neuroendocrine tumor, with metastasis to 14 of 32 LNs. Margins clear. -07/16/19 PETshowstworight hepatic lesion which areintensely hypermetabolic consistent with metastatic NET. There is also a smaller less avid left hepatic lesion that is concerning for metastasis which was not seen on CT from 06/11/19.No other evidence of mets -His MRI abdomen from 08/04/19 shows 3 liver lesions (0.8-4.4cm) compatible with neuroendocrine metastatic lesions.  -He was seen by Dr. Barry Dienes and she did not recommend surgery at this time due to the multiple liver mets.  -I started him on monthly Lanreotide injections on 08/01/19. Will continue injections to treat and  control his disease. If he responds well he may be able to do liver ablation down the road. I briefly discussed option treatment of Lutathera monthly X4 as a later line of treatment.  -I personally reviewed and discussed his DOTATATE PET from 05/31/20 which shows disease progression in liver, and a new small focus of radiotracer activity within the head of pancreas, which could be a regional lymph node.  -Plan to stop Lanreotide injection due to disease progression and the lack of clinical benefits -I discussed next treatment, including Lutathera and Afinitor.  I discussed the benefit and potential side effects with him in detail, I recommend Lutathera treatment, he sgrees to proceed. Will refer him to radiology Dr. Marin Shutter  -He understands the goal of therapy is palliative for disease control, not curative. -f/u in 2 months with lab    3. RA -previously on methotrexate and enbrel -not currently on therapy  4. Mild Diarrhea  -Secondary to lanreotide injections  -Dwayne Sawyer has had loose mouse-like stool since surgery and has occasional diarrhea at least 3 times a day. Overall manageable.  -I recommend increased water intake and imodium if needed.   5. Hypertension, Hyperglycemia and CKD -f/u with PCP  6. Transgender and estrogen use -Dwayne Sawyer has started estrogen with Mollie Germany, NP with goal of transgender transitioning.  -I checked his neuroendocrine ER and PR which were negative, so there is no contraindication for estrogen use. Can continue    PLAN: -PET scan reviewed, it unfortunately showed disease progression -I will refer him to radiologist Dr. Marin Shutter for Lutathera treatments    No problem-specific Assessment & Plan notes found for this encounter.   No orders of the defined types were placed in this encounter.  All questions were answered. The patient knows to call the clinic with any problems, questions or concerns. No barriers to learning was detected. The total time  spent in the appointment was 30 minutes.     Truitt Merle, MD 06/03/2020   I, Joslyn Devon, am acting as scribe for Truitt Merle, MD.   I have reviewed the above documentation for accuracy and completeness, and I agree with the above.

## 2020-06-03 ENCOUNTER — Inpatient Hospital Stay: Payer: Self-pay | Attending: Nurse Practitioner | Admitting: Hematology

## 2020-06-03 ENCOUNTER — Other Ambulatory Visit: Payer: Self-pay

## 2020-06-03 ENCOUNTER — Encounter: Payer: Self-pay | Admitting: Hematology

## 2020-06-03 ENCOUNTER — Other Ambulatory Visit (HOSPITAL_COMMUNITY): Payer: Self-pay | Admitting: Hematology

## 2020-06-03 VITALS — BP 160/102 | HR 87 | Temp 98.4°F | Resp 17 | Ht 69.0 in | Wt 251.3 lb

## 2020-06-03 DIAGNOSIS — N189 Chronic kidney disease, unspecified: Secondary | ICD-10-CM | POA: Insufficient documentation

## 2020-06-03 DIAGNOSIS — D3A098 Benign carcinoid tumors of other sites: Secondary | ICD-10-CM

## 2020-06-03 DIAGNOSIS — C7B8 Other secondary neuroendocrine tumors: Secondary | ICD-10-CM | POA: Insufficient documentation

## 2020-06-03 DIAGNOSIS — C7A011 Malignant carcinoid tumor of the jejunum: Secondary | ICD-10-CM

## 2020-06-03 DIAGNOSIS — I129 Hypertensive chronic kidney disease with stage 1 through stage 4 chronic kidney disease, or unspecified chronic kidney disease: Secondary | ICD-10-CM | POA: Insufficient documentation

## 2020-06-03 DIAGNOSIS — Z79899 Other long term (current) drug therapy: Secondary | ICD-10-CM | POA: Insufficient documentation

## 2020-06-03 DIAGNOSIS — C7A8 Other malignant neuroendocrine tumors: Secondary | ICD-10-CM | POA: Insufficient documentation

## 2020-06-04 NOTE — Progress Notes (Signed)
..  The following Assist/Replace Program for S[employer] from [place] has been terminated due to Dr. Benay Sawyer d/c Treatment due to disease progression.  Last DOS:05/07/2020

## 2020-06-22 ENCOUNTER — Ambulatory Visit (HOSPITAL_COMMUNITY)
Admission: RE | Admit: 2020-06-22 | Discharge: 2020-06-22 | Disposition: A | Discharge status: Home / Self Care | Payer: Self-pay | Source: Ambulatory Visit | Attending: Hematology | Admitting: Hematology

## 2020-06-22 ENCOUNTER — Other Ambulatory Visit: Payer: Self-pay

## 2020-06-22 DIAGNOSIS — D3A098 Benign carcinoid tumors of other sites: Secondary | ICD-10-CM | POA: Insufficient documentation

## 2020-06-22 IMAGING — NM NM RADIOLOGIST EVAL AND MGMT
1 series · 1 of 1 positions shown · non-contrast
Comparison: none

[Series 1: static · 2.07mm/px · 1 of 1 slices shown]
[im 1/1]
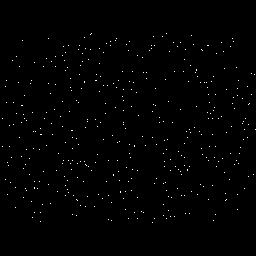

[1 of 1 positions shown; findings below may reference images not displayed]

EXAM:
NEW PATIENT OFFICE VISIT

CHIEF COMPLAINT:
49-year-old male with metastatic well differentiated neuroendocrine
tumor.

Current Pain Level: 1-10

HISTORY OF PRESENT ILLNESS:
Patient presented on [DATE] with small bowel obstruction
identified CT abdomen pelvis exam. Subsequent partial resection of
the small bowel demonstrated well differentiated neuroendocrine
tumor with positive regional lymph nodes.

Subsequent DOTATATE PET scan [DATE] demonstrated large
metastatic well differentiated neuroendocrine tumor within RIGHT
hepatic lobe.

Patient was initiated on monthly Sandostatin injection on
[DATE].

Subsequent PET-CT scan [DATE] demonstrated progression of well
differentiated neuroendocrine tumor with increase in the dominant
RIGHT hepatic lobe lesions several smaller RIGHT hepatic lobe
lesions.

Patient reports mild loose stools and some mild flushing. Symptoms
not affected by Sandostatin injections.

REVIEW OF SYSTEMS:
See [REDACTED] note

PHYSICAL EXAMINATION:
See [REDACTED] note

ASSESSMENT AND PLAN:
Patient is a candidate for peptide receptor radiotherapy. Patient
has progressive well differentiated neuroendocrine tumor identified
within the multifocal liver metastasis . Patient has mild carcinoid
tumor loose stools and mild flushing. Monthlhy Sandostatin
injections have not improved symptoms.

The patient was counseled on the primary goal of therapy which is
prolongation of progression free survival (79% improvement over
standard therapy). Secondary goals would include decrease in tumor
burden and decrease in carcinoid symptoms.

Primary of toxicities of therapy were explained to patient including
marrow suppression, renal toxicity and hepatic toxicity. Rare
toxicity of myelosuppression and leukemia also explained. Potential
toxicity will be monitored throughout the course of therapy with
interval CBC and CMP laboratory evaluation.

Four therapies will be scheduled 2 months apart over a six-month
interval. Patient will receive IM Sandostatin injection in the
molecular imaging department after each therapy. Patient will return
to oncology clinic 1 month following each therapy for CBC and CMP.
Some discussion around stopping Sandostatin injections as no
clinical benefit has been identified in this patient.

## 2020-06-22 NOTE — Consult Note (Signed)
Chief Complaint: Patient with metastatic neuroendocrine tumor was for  evaluation Peptide receptor radiotherapy (PRRT) with VQ008 DOTATATE (Lutathera).  Referring Physician(s):Ronny Flurry    Patient Status: [facility] - Out-pt  History of Present Illness: Dwayne Sawyer is a 49 y.o. male who presented on June 11, 2019 with small bowel obstruction identified CT abdomen pelvis exam.  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   Subsequent DOTATATE PET scan 07/16/2019 demonstrated large metastatic well differentiated neuroendocrine tumor within RIGHT hepatic lobe.   Patient was initiated on monthly Sandostatin injection on 08/11/2019.   Subsequent PET-CT scan 05/31/2020 demonstrated progression of well differentiated neuroendocrine tumor with increase in the dominant RIGHT hepatic lobe lesions several smaller RIGHT hepatic lobe lesions.   Patient reports mild loose stools and some mild flushing.  Symptoms not affected by Sandostatin injections.    Past Medical History:  Diagnosis Date  . Arthritis   . Family history of carcinoid tumor   . High cholesterol   . Hypertension     Past Surgical History:  Procedure Laterality Date  . HERNIA REPAIR    . LAPAROTOMY N/A 06/12/2019   Procedure: EXPLORATORY LAPAROTOMY WITH SMALL BOWEL RESECTION;  Surgeon: Johnathan Hausen, MD;  Location: [facility];  Service: General;  Laterality: N/A;  . SHOULDER SURGERY      Allergies: Patient has no known allergies.  Medications: Prior to Admission medications   Medication Sig Start Date End Date Taking? Authorizing Provider  amLODipine (NORVASC) 10 MG tablet TAKE ONE TABLET BY MOUTH DAILY 01/25/20   Fulp, Cammie, MD  atorvastatin (LIPITOR) 10 MG tablet TAKE ONE TABLET BY MOUTH DAILY AT 6:00 IN THE EVENING 01/30/20   Fulp, Cammie, MD  carvedilol (COREG) 3.125 MG tablet Take 1 tablet (3.125 mg total) by mouth 2 (two)  times daily with a meal. 02/19/20   Camillia Herter, NP  valsartan-hydrochlorothiazide (DIOVAN-HCT) 160-25 MG tablet TAKE ONE TABLET BY MOUTH DAILY 01/25/20   Antony Blackbird, MD     Family History  Problem Relation Age of Onset  . Cancer Father        widespread carcinoid cancer  . Hypertension Mother   . Hyperlipidemia Mother     Social History   Socioeconomic History  . Marital status: Legally Separated    Spouse name: Not on file  . Number of children: Not on file  . Years of education: Not on file  . Highest education level: Not on file  Occupational History  . Not on file  Tobacco Use  . Smoking status: Never Smoker  . Smokeless tobacco: Current User    Types: Chew  Vaping Use  . Vaping Use: Never used  Substance and Sexual Activity  . Alcohol use: Yes    Comment: occ  . Drug use: Never  . Sexual activity: Not on file  Other Topics Concern  . Not on file  Social History Narrative  . Not on file   Social Determinants of Health   Financial Resource Strain:   . Difficulty of Paying Living Expenses: Not on file  Food Insecurity:   . Worried About Charity fundraiser in the Last Year: Not on file  . Ran Out of Food in the Last Year: Not on file  Transportation Needs:   . Lack of Transportation (Medical): Not on file  . Lack of Transportation (Non-Medical): Not on file  Physical Activity:   . Days of Exercise per Week: Not  on file  . Minutes of Exercise per Session: Not on file  Stress:   . Feeling of Stress : Not on file  Social Connections:   . Frequency of Communication with Friends and Family: Not on file  . Frequency of Social Gatherings with Friends and Family: Not on file  . Attends Religious Services: Not on file  . Active Member of Clubs or Organizations: Not on file  . Attends Archivist Meetings: Not on file  . Marital Status: Not on file    ECOG Status: 1 - Symptomatic but completely ambulatory  Review of Systems: A 12 point ROS  discussed and pertinent positives are indicated in the HPI above.  All other systems are negative.  Review of Systems  Gastrointestinal: Positive for diarrhea.  Skin: Positive for color change.    Vital Signs: There were no vitals taken for this visit.  Physical Exam Defferred for COVID 19 safety protocol. Imaging: NM PET (NETSPOT GA 50 DOTATATE) SKULL BASE TO MID THIGH  Result Date: 05/31/2020 CLINICAL DATA:  Restaging carcinoid tumor. Small-bowel resection 2020. Lanreotide injections ongoing. EXAM: NUCLEAR MEDICINE PET SKULL BASE TO THIGH TECHNIQUE: 5.0 mCi Ga 68/Cu 64 DOTATATE was injected intravenously. Full-ring PET imaging was performed from the skull base to thigh after the radiotracer. CT data was obtained and used for attenuation correction and anatomic localization. COMPARISON:  07/16/2019, DOTATATE PET scan FINDINGS: NECK No radiotracer activity in neck lymph nodes. Incidental CT findings: None CHEST Small 4 mm nodule in the RIGHT lower lobe (image 71) does not have radiotracer activity. No abnormal mediastinal lymph nodes. Mild activity associated with RIGHT axillary lymph nodes may relate to recent COVID vaccination. Incidental CT finding:None ABDOMEN/PELVIS Intense radiotracer activity within the dominant RIGHT hepatic lobe lesion with SUV max equal 36 increased from SUV max equal 21 on DOTATATE PET scan 07/16/2019. The hepatic metastatic lesions are more conspicuous on the noncontrast CT scan as there is increased hepatic steatosis which contrasts with the hyperdense lesions. For example a dominant lesion in the RIGHT hepatic lobe measures 5.8 by 4.6 cm which compares to 4.6 by 3.4 cm on MRI 08/04/2019 for increase in size. Several smaller lesion now evident on the noncontrast CT including 10 mm lesion in the central LEFT hepatic lobe on image 101/series 4, and posterior RIGHT hepatic lobe lesion measuring 12 mm image 88. These lesions also have radiotracer activity which is increased  from prior. No clear new lesions are present however again the smaller lesions are more conspicuous on current exam. There is a new focus of radiotracer activity localizes to the head of the pancreas with SUV max equal 19.5. This lesion is small andless than 1 cm however difficult define on noncontrast CT. Lesion is present on image 115 fused data set. No additional abnormal radiotracer in the abdomen pelvis. Activity within the inguinal nodes is favored reactive. Physiologic activity noted in the liver, spleen, adrenal glands and kidneys. Incidental CT findings:None SKELETON No focal activity to suggest skeletal metastasis. Incidental CT findings:None IMPRESSION: 1. Progression of well differentiated neuroendocrine tumor hepatic metastasis with increase in size and radiotracer activity of dominant lesion in the LEFT hepatic lobe and multiple small lesions as described above. 2. New small focus of radiotracer activity within the head of the pancreas. 3. Increased hepatic steatosis. Electronically Signed   By: Suzy Bouchard M.D.   On: 05/31/2020 12:17   NM PET (NETSPOT GA 23 DOTATATE) SKULL BASE TO MID THIGH  Result Date: 07/16/2019 CLINICAL  DATA:  Initial staging of neuroendocrine tumor of the small bowel. EXAM: NUCLEAR MEDICINE PET SKULL BASE TO THIGH TECHNIQUE: 3.83 mCi Ga 66 DOTATATE was injected intravenously. Full-ring PET imaging was performed from the skull base to thigh after the radiotracer. CT data was obtained and used for attenuation correction and anatomic localization. COMPARISON:  CT 11 18 2020 FINDINGS: NECK No radiotracer activity in neck lymph nodes. Incidental CT findings: None CHEST No radiotracer accumulation within mediastinal or hilar lymph nodes. Small pulmonary nodule in the RIGHT upper lobe measures 3 mm (image 81/4). Pulmonary nodule in the LEFT upper lobe measures 6 mm (image 6 7/40 without radiotracer activity. Incidental CT finding:None ABDOMEN/PELVIS Within the RIGHT hepatic  lobe, 4.7 cm well-circumscribed round lesion adjacent to the liver capsule has intense radiotracer activity SUV max equal 21.8. Second focus of radiotracer activity is much smaller and less intense with SUV max equal 11.4 on image 104 of the fused data set. There is misregistration between the PET data set and CT involving the upper abdomen as noted by splenic misregistration. This is likely due to breathing during the CT scan. Interval resection of the central mesentery mass. There is an enteric enteric anastomosis in the RIGHT abdomen. No abnormal activity associated with the small bowel or mesentery. No peritoneal nodules. No lymphadenopathy. Physiologic activity noted in the liver, spleen, adrenal glands and kidneys. Incidental CT findings:None SKELETON No focal activity to suggest skeletal metastasis. Incidental CT findings:None IMPRESSION: 1. Interval resection small bowel and mesenteric mass with no evidence residual mesenteric or small bowel neuroendocrine tumor. 2. Large lesion in RIGHT hepatic lobe with intense radiotracer activity consistent with well differentiated neuroendocrine tumor metastasis. Smaller lesion in the anterior LEFT hepatic lobe is concerning for second hepatic metastasis. 3. Pulmonary nodule measuring 6 mm in LEFT upper lobe is not have radiotracer activity. Smaller RIGHT upper lobe nodule. Recommend close attention on follow-up. Electronically Signed   By: Suzy Bouchard M.D.   On: 07/16/2019 15:19    Labs:  CBC: Recent Labs    08/01/19 0912 10/24/19 1013 02/06/20 1050 05/12/20 0822  WBC 7.7 7.6 8.1 7.2  HGB 14.7 14.5 14.4 13.8  HCT 45.1 44.8 43.9 42.6  PLT 353 324 339 284    COAGS: No results for input(s): INR, APTT in the last 8760 hours.  BMP: Recent Labs    08/01/19 0912 09/01/19 0915 10/24/19 1013 11/14/19 1428 02/06/20 1050 05/12/20 0822  NA   < > 142 143 138 138 140  K   < > 4.6 4.0 4.1 3.8 4.3  CL   < > 102 104 99 102 105  CO2   < > 23 27 24  25 27   GLUCOSE   < > 91 121* 98 236* 126*  BUN   < > 20 22* 17 20 20   CALCIUM   < > 9.6 9.6 9.8 9.9 9.3  CREATININE   < > 1.32* 1.40* 1.46* 1.61* 1.08  GFRNONAA   < > 62 58* 55* 49* >60  GFRAA  --  72 >60 63 57*  --    < > = values in this interval not displayed.    LIVER FUNCTION TESTS: Recent Labs    08/01/19 0912 10/24/19 1013 02/06/20 1050 05/12/20 0822  BILITOT 0.6 0.6 0.6 0.4  AST 13* 13* 15 16  ALT 23 16 26 29   ALKPHOS 105 88 77 58  PROT 8.1 8.0 8.1 7.6  ALBUMIN 4.4 4.3 4.4 4.0    TUMOR MARKERS:  Recent Labs    08/01/19 0912 10/24/19 1013 02/06/20 1050 05/12/20 0822  CHROMOGA 82.9 50.7 56.2 48.6    Assessment and Plan: [Patient is a candidate for peptide receptor radiotherapy.  Patient has progressive well differentiated neuroendocrine tumor identified within the multifocal liver metastasis .  Patient has mild carcinoid tumor loose stools and mild flushing.  Monthlhy Sandostatin injections have not improved symptoms.     The patient was counseled on the primary goal of therapy which is prolongation of progression free survival (79% improvement over standard therapy).  Secondary goals would include decrease in tumor burden and decrease in carcinoid symptoms.    Primary of toxicities of therapy were explained to patient including marrow suppression, renal toxicity and hepatic toxicity.  Rare toxicity of myelosuppression and leukemia also explained.  Potential toxicity will be monitored throughout the course of therapy with interval  CBC and CMP laboratory evaluation.    Four therapies will be scheduled  2 months apart over a  six-month interval.  Patient will receive IM Sandostatin injection in the molecular imaging department after each therapy.  Patient will return to oncology clinic 1 month following each therapy for CBC and CMP. Some discussion around stopping Sandostatin injections as no clinical benefit has been identified in this patient.    Thank you for this  interesting consult.  I greatly enjoyed meeting Boss Danielsen and look forward to participating in their care.  A copy of this report was sent to the requesting provider on this date.  Electronically Signed: Rennis Golden, MD 06/22/2020, 2:42 PM   I spent a total of  30 Minutes   in face to face in clinical consultation, greater than 50% of which was counseling/coordinating care for metastatic neuroendocrine tumor.

## 2020-07-27 ENCOUNTER — Other Ambulatory Visit (HOSPITAL_COMMUNITY): Payer: Self-pay | Admitting: Hematology

## 2020-07-27 DIAGNOSIS — D3A00 Benign carcinoid tumor of unspecified site: Secondary | ICD-10-CM

## 2020-08-03 ENCOUNTER — Other Ambulatory Visit: Payer: Self-pay

## 2020-08-03 ENCOUNTER — Other Ambulatory Visit: Payer: Self-pay | Admitting: Hematology

## 2020-08-03 DIAGNOSIS — C7A011 Malignant carcinoid tumor of the jejunum: Secondary | ICD-10-CM

## 2020-08-04 NOTE — Progress Notes (Signed)
[facility]   [phone] [phone]   Clinic Follow up Note   Patient Care Team: Antony Blackbird, MD (Inactive) as PCP - General (Family Medicine)  Date of Service:  08/05/2020  CHIEF COMPLAINT: F/u neuroendocrine tumor  SUMMARY OF ONCOLOGIC HISTORY: Oncology History Overview Note  Cancer Staging No matching staging information was found for the patient.    Carcinoid tumor of small intestine   06/11/2019 Imaging   US Abdomen 06/11/19  IMPRESSION: 1. There is a solid mass in the right lobe of the liver which based on current measurements may have enlarged since the prior study from 2015. Images from the previous MR at that time cannot be retrieved. Given this circumstance, it may be prudent to correlate with pre and serial post-contrast MR or CT of the liver to further evaluate. There is underlying diffuse increase in liver echogenicity, a finding indicative of hepatic steatosis.   2. Multiple loops of fluid-filled bowel. Question a degree of ileus or enteritis.   3. Study otherwise unremarkable. Note that much of the common bile duct is obscured by gas.    06/11/2019 Imaging   CT AP W Contrast 06/11/19   IMPRESSION: 1. High-grade small bowel obstruction secondary to desmoplastic response in the central right abdominal mesentery centered on a 4 cm calcified irregular/spiculated soft tissue lesion. Abrupt small bowel transition zone is identified immediately lead adjacent to this mesenteric lesion and multiple adjacent bowel loops are tethered into this region with mesenteric edema/congestion. The loop of small bowel immediately proximal to the transition zone shows mild circumferential wall thickening but no pneumatosis. 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 2. Small lymph nodes in the abnormal right mesentery suggest additional metastatic involvement. 3.  Heterogeneous liver parenchyma, likely secondary to geographic fatty deposition. There is a focal 14 mm low-density lesion in the right liver. The patient had an MRI in the [facility] system on 08/09/2013 to evaluate a right liver lesion, but those images are not available. Follow-up MRI without and with contrast recommended to exclude metastatic disease. 4. Tiny sclerotic foci in the T12 vertebral body in both femoral heads are likely benign, but close attention on follow-up recommended. 5.  Aortic Atherosclerois (ICD10-170.0)      06/12/2019 Surgery    EXPLORATORY LAPAROTOMY WITH SMALL BOWEL RESECTION by Dr. 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 Interval resection small bowel and mesenteric mass with no evidence residual mesenteric or small bowel neuroendocrine tumor. 2. Large lesion in RIGHT hepatic lobe with intense radiotracer activity consistent with well differentiated neuroendocrine tumor metastasis. Smaller lesion in the anterior LEFT hepatic lobe is concerning for second hepatic metastasis. 3. Pulmonary nodule measuring 6 mm in LEFT upper lobe is not have radiotracer activity. Smaller RIGHT upper lobe nodule. Recommend close attention on follow-up.   08/01/2019 - 05/07/2020 Chemotherapy   Lanreotide injection monthly starting 08/01/19. Stopped after 05/07/20 due to disease progression  08/04/2019 Imaging   MRI abdomen  IMPRESSION: 1.  The previously noted lesion in the right lobe of the liver has grown slightly compared to 2015. In addition, today's study demonstrates 2 smaller lesions with similar imaging characteristics. Given the activity on the recent PET Dotatate scan, these lesions are all compatible with small neuroendocrine metastatic lesions. 2. Hepatic steatosis.   02/10/2020 Imaging   MRI abdomen  IMPRESSION: 1. The dominant right hepatic lobe mass has mildly increased in size compared to the prior exam. This currently measures 5.4 by 4.5 cm, previously 4.9 by 4.2 cm. 2. Nine additional small T2 hyperintense foci in the liver are stable, and while the small size makes these less specific, there likely small metastatic lesions. Today's exam has the benefit of less motion artifact compared to the 06/03/2020 MRI, and I was able to pick at each of these tiny lesions on the prior exam in retrospect. It is conceivable that 1 or more of these tiny lesions could represent small hemangiomas, although I do not observe classic delayed enhancement pattern. 3. Diffuse hepatic steatosis.   05/31/2020 PET scan   DOTATATE PET  IMPRESSION: 1. Progression of well differentiated neuroendocrine tumor hepatic metastasis with increase in size and radiotracer activity of dominant lesion in the LEFT hepatic lobe and multiple small lesions as described above. 2. New small focus of radiotracer activity within the head of the pancreas. 3. Increased hepatic steatosis.      Chemotherapy   Lutathera treatments on 08/11/20, 10/06/20, 12/01/20, 01/26/21         CURRENT THERAPY:  Pending Lutathera treatments on 08/11/20, 10/06/20, 12/01/20, 01/26/21  INTERVAL HISTORY:  Dwayne Sawyer is here for a follow up. He presents to the clinic alone. Mild diarrhea sometime, slightly worse than when he was on Sandostatin injection  Mild flushing is stable  No pain or other complains.  He overall feels well.  All other systems were reviewed  with the patient and are negative.  MEDICAL HISTORY:  Past Medical History:  Diagnosis Date  . Arthritis   . Family history of carcinoid tumor   . High cholesterol   . Hypertension     SURGICAL HISTORY: Past Surgical History:  Procedure Laterality Date  . HERNIA REPAIR    . LAPAROTOMY N/A 06/12/2019   Procedure: EXPLORATORY LAPAROTOMY WITH SMALL BOWEL RESECTION;  Surgeon: Johnathan Hausen, MD;  Location: [facility];  Service: General;  Laterality: N/A;  . SHOULDER SURGERY      I have reviewed the social history and family history with the patient and they are unchanged from previous note.  ALLERGIES:  has No Known Allergies.  MEDICATIONS:  Current Outpatient Medications  Medication Sig Dispense Refill  . amLODipine (NORVASC) 10 MG tablet TAKE ONE TABLET BY MOUTH DAILY 90 tablet 1  . atorvastatin (LIPITOR) 10 MG tablet TAKE ONE TABLET BY MOUTH DAILY AT 6:00 IN THE EVENING 30 tablet 3  . carvedilol (COREG) 3.125 MG tablet Take 1 tablet (3.125 mg total) by mouth 2 (two) times daily with a meal. 60 tablet 0  . valsartan-hydrochlorothiazide (DIOVAN-HCT) 160-25 MG tablet TAKE ONE TABLET BY MOUTH DAILY 90 tablet 1   No current facility-administered medications for this visit.    PHYSICAL EXAMINATION: ECOG PERFORMANCE STATUS: 0 - Asymptomatic  Vitals:   08/05/20 1001  BP: (!) 157/108  Pulse: 80  Resp: 16  Temp: 97.7 F (36.5 C)  SpO2: 100%   Filed Weights   08/05/20 1001  Weight: 264 lb (119.7 kg)  GENERAL:alert, no distress and comfortable SKIN: skin color, texture, turgor are normal, no rashes or significant lesions ABDOMEN:abdomen soft, non-tender and normal bowel sounds Musculoskeletal:no cyanosis of digits and no clubbing  NEURO: alert & oriented x 3 with fluent speech, no focal motor/sensory deficits  LABORATORY DATA:  I have reviewed the data as listed CBC Latest Ref Rng & Units 08/05/2020 05/12/2020 02/06/2020  WBC 4.0 - 10.5 K/uL 9.9 7.2 8.1  Hemoglobin 13.0  - 17.0 g/dL 14.0 13.8 14.4  Hematocrit 39.0 - 52.0 % 42.7 42.6 43.9  Platelets 150 - 400 K/uL 315 284 339     CMP Latest Ref Rng & Units 08/05/2020 05/12/2020 02/06/2020  Glucose 70 - 99 mg/dL 114(H) 126(H) 236(H)  BUN 6 - 20 mg/dL 20 20 20   Creatinine 0.61 - 1.24 mg/dL 1.17 1.08 1.61(H)  Sodium 135 - 145 mmol/L 138 140 138  Potassium 3.5 - 5.1 mmol/L 3.9 4.3 3.8  Chloride 98 - 111 mmol/L 102 105 102  CO2 22 - 32 mmol/L 28 27 25   Calcium 8.9 - 10.3 mg/dL 9.5 9.3 9.9  Total Protein 6.5 - 8.1 g/dL 7.8 7.6 8.1  Total Bilirubin 0.3 - 1.2 mg/dL 0.4 0.4 0.6  Alkaline Phos 38 - 126 U/L 60 58 77  AST 15 - 41 U/L 12(L) 16 15  ALT 0 - 44 U/L 18 29 26       RADIOGRAPHIC STUDIES: I have personally reviewed the radiological images as listed and agreed with the findings in the report. No results found.   ASSESSMENT & PLAN:  Dwayne Sawyer is a 50 y.o. male with   1. 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 Pathology confirmed well differentiated neuroendocrine tumor, with metastasis to 14 of 32 LNs. Margins clear. -07/16/19 PETshowstworight hepatic lesion which areintensely hypermetabolic consistent with metastatic NET. There is also a smaller less avid left hepatic lesion that is concerning for metastasis which was not seen on CT from 06/11/19.No other evidence of mets -HisMRI abdomen from 1/11/21shows 3 liver lesions (0.8-4.4cm) compatible with neuroendocrine metastatic lesions.  -He was seen by Dr. Barry Dienes and she did not recommend surgery due to the multiple liver mets.  -He was on monthly Lanreotide injections since 08/01/19, but stopped after 05/07/20 due to disease progression as seen on 05/31/20 DOTATATE PET scan  -He will proceed with Lutathera treatments planned for 08/11/20, 10/06/20, 12/01/20, 01/26/21 with Dr Marin Shutter. Goal of treatment is palliative. I  reviewed benefit and potential side effects with him  -We will monitor his labs when he is on treatment -Plan to observe after he completes receiving the treatment until disease progression again  -f/u after he completes Dwayne Sawyer treatment   3. RA -previously on methotrexate and enbrel -not currently on therapy   4. Mild Diarrhea  -Secondary to lanreotide injections -Dwayne Sawyer has had loose mouse-like stool since surgery and has occasional diarrhea at least 3 times a day. Overall manageable.  -I recommend increased water intake and imodium if needed.  5.Hypertension, Hyperglycemiaand CKD -BP not well controlled  -He plans to meet his new PCP next month  6.Transgender and estrogen use -Dwayne Sawyer has started estrogen withLoren Sawyer, NPwith goal of transgender transitioning.  -I checked his neuroendocrine ER and PR which were negative, so there isno contraindication for estrogen use. Can continue    PLAN: -He will start Lutathera treatments next week  -lab in 3 months -lab and  f/u in 5 months    No problem-specific Assessment & Plan notes found for this encounter.   No orders of the defined types were placed in this encounter.  All questions were answered. The patient knows to call the clinic with any problems, questions or concerns. No barriers to learning was detected. The total time spent in the appointment was 25 minutes.     Truitt Merle, MD 08/05/2020   I, Joslyn Devon, am acting as scribe for Truitt Merle, MD.   I have reviewed the above documentation for accuracy and completeness, and I agree with the above.

## 2020-08-05 ENCOUNTER — Encounter: Payer: Self-pay | Admitting: Hematology

## 2020-08-05 ENCOUNTER — Inpatient Hospital Stay: Payer: 59 | Attending: Nurse Practitioner

## 2020-08-05 ENCOUNTER — Inpatient Hospital Stay (HOSPITAL_BASED_OUTPATIENT_CLINIC_OR_DEPARTMENT_OTHER): Payer: 59 | Admitting: Hematology

## 2020-08-05 ENCOUNTER — Other Ambulatory Visit: Payer: Self-pay

## 2020-08-05 VITALS — BP 157/108 | HR 80 | Temp 97.7°F | Resp 16 | Ht 69.0 in | Wt 264.0 lb

## 2020-08-05 DIAGNOSIS — I129 Hypertensive chronic kidney disease with stage 1 through stage 4 chronic kidney disease, or unspecified chronic kidney disease: Secondary | ICD-10-CM | POA: Diagnosis not present

## 2020-08-05 DIAGNOSIS — K760 Fatty (change of) liver, not elsewhere classified: Secondary | ICD-10-CM | POA: Diagnosis not present

## 2020-08-05 DIAGNOSIS — C7A8 Other malignant neuroendocrine tumors: Secondary | ICD-10-CM | POA: Diagnosis not present

## 2020-08-05 DIAGNOSIS — R232 Flushing: Secondary | ICD-10-CM | POA: Insufficient documentation

## 2020-08-05 DIAGNOSIS — C7B8 Other secondary neuroendocrine tumors: Secondary | ICD-10-CM | POA: Diagnosis not present

## 2020-08-05 DIAGNOSIS — Z79899 Other long term (current) drug therapy: Secondary | ICD-10-CM | POA: Insufficient documentation

## 2020-08-05 DIAGNOSIS — D3A098 Benign carcinoid tumors of other sites: Secondary | ICD-10-CM

## 2020-08-05 DIAGNOSIS — N189 Chronic kidney disease, unspecified: Secondary | ICD-10-CM | POA: Diagnosis not present

## 2020-08-05 DIAGNOSIS — C7A011 Malignant carcinoid tumor of the jejunum: Secondary | ICD-10-CM

## 2020-08-05 LAB — CBC WITH DIFFERENTIAL (CANCER CENTER ONLY)
Abs Immature Granulocytes: 0.02 10*3/uL (ref 0.00–0.07)
Basophils Absolute: 0.1 10*3/uL (ref 0.0–0.1)
Basophils Relative: 1 %
Eosinophils Absolute: 0.3 10*3/uL (ref 0.0–0.5)
Eosinophils Relative: 3 %
HCT: 42.7 % (ref 39.0–52.0)
Hemoglobin: 14 g/dL (ref 13.0–17.0)
Immature Granulocytes: 0 %
Lymphocytes Relative: 14 %
Lymphs Abs: 1.4 10*3/uL (ref 0.7–4.0)
MCH: 28.6 pg (ref 26.0–34.0)
MCHC: 32.8 g/dL (ref 30.0–36.0)
MCV: 87.3 fL (ref 80.0–100.0)
Monocytes Absolute: 0.8 10*3/uL (ref 0.1–1.0)
Monocytes Relative: 9 %
Neutro Abs: 7.2 10*3/uL (ref 1.7–7.7)
Neutrophils Relative %: 73 %
Platelet Count: 315 10*3/uL (ref 150–400)
RBC: 4.89 MIL/uL (ref 4.22–5.81)
RDW: 13.1 % (ref 11.5–15.5)
WBC Count: 9.9 10*3/uL (ref 4.0–10.5)
nRBC: 0 % (ref 0.0–0.2)

## 2020-08-05 LAB — CMP (CANCER CENTER ONLY)
ALT: 18 U/L (ref 0–44)
AST: 12 U/L — ABNORMAL LOW (ref 15–41)
Albumin: 4.1 g/dL (ref 3.5–5.0)
Alkaline Phosphatase: 60 U/L (ref 38–126)
Anion gap: 8 (ref 5–15)
BUN: 20 mg/dL (ref 6–20)
CO2: 28 mmol/L (ref 22–32)
Calcium: 9.5 mg/dL (ref 8.9–10.3)
Chloride: 102 mmol/L (ref 98–111)
Creatinine: 1.17 mg/dL (ref 0.61–1.24)
GFR, Estimated: >60 mL/min (ref >60)
Glucose, Bld: 114 mg/dL — ABNORMAL HIGH (ref 70–99)
Potassium: 3.9 mmol/L (ref 3.5–5.1)
Sodium: 138 mmol/L (ref 135–145)
Total Bilirubin: 0.4 mg/dL (ref 0.3–1.2)
Total Protein: 7.8 g/dL (ref 6.5–8.1)

## 2020-08-09 ENCOUNTER — Telehealth: Payer: Self-pay | Admitting: Hematology

## 2020-08-09 NOTE — Telephone Encounter (Signed)
Scheduled appts per 1/13 los. Pt confirmed appt date and time.

## 2020-08-11 ENCOUNTER — Ambulatory Visit (HOSPITAL_COMMUNITY): Payer: MEDICAID

## 2020-08-11 LAB — CHROMOGRANIN A: Chromogranin A (ng/mL): 54.8 ng/mL (ref 0.0–101.8)

## 2020-08-17 ENCOUNTER — Ambulatory Visit (HOSPITAL_COMMUNITY)
Admission: RE | Admit: 2020-08-17 | Discharge: 2020-08-17 | Disposition: A | Discharge status: Home / Self Care | Payer: 59 | Source: Ambulatory Visit | Attending: Hematology | Admitting: Hematology

## 2020-08-17 ENCOUNTER — Other Ambulatory Visit: Payer: Self-pay

## 2020-08-17 DIAGNOSIS — D3A00 Benign carcinoid tumor of unspecified site: Secondary | ICD-10-CM | POA: Diagnosis not present

## 2020-08-17 LAB — CBC WITH DIFFERENTIAL/PLATELET
Abs Immature Granulocytes: 0.05 10*3/uL (ref 0.00–0.07)
Basophils Absolute: 0.1 10*3/uL (ref 0.0–0.1)
Basophils Relative: 1 %
Eosinophils Absolute: 0.3 10*3/uL (ref 0.0–0.5)
Eosinophils Relative: 3 %
HCT: 42.2 % (ref 39.0–52.0)
Hemoglobin: 14 g/dL (ref 13.0–17.0)
Immature Granulocytes: 1 %
Lymphocytes Relative: 12 %
Lymphs Abs: 1.3 10*3/uL (ref 0.7–4.0)
MCH: 29 pg (ref 26.0–34.0)
MCHC: 33.2 g/dL (ref 30.0–36.0)
MCV: 87.6 fL (ref 80.0–100.0)
Monocytes Absolute: 0.9 10*3/uL (ref 0.1–1.0)
Monocytes Relative: 8 %
Neutro Abs: 8 10*3/uL — ABNORMAL HIGH (ref 1.7–7.7)
Neutrophils Relative %: 75 %
Platelets: 312 10*3/uL (ref 150–400)
RBC: 4.82 MIL/uL (ref 4.22–5.81)
RDW: 13.2 % (ref 11.5–15.5)
WBC: 10.5 10*3/uL (ref 4.0–10.5)
nRBC: 0 % (ref 0.0–0.2)

## 2020-08-17 LAB — COMPREHENSIVE METABOLIC PANEL
ALT: 20 U/L (ref 0–44)
AST: 20 U/L (ref 15–41)
Albumin: 4.1 g/dL (ref 3.5–5.0)
Alkaline Phosphatase: 50 U/L (ref 38–126)
Anion gap: 10 (ref 5–15)
BUN: 17 mg/dL (ref 6–20)
CO2: 25 mmol/L (ref 22–32)
Calcium: 8.9 mg/dL (ref 8.9–10.3)
Chloride: 101 mmol/L (ref 98–111)
Creatinine, Ser: 0.96 mg/dL (ref 0.61–1.24)
GFR, Estimated: >60 mL/min (ref >60)
Glucose, Bld: 106 mg/dL — ABNORMAL HIGH (ref 70–99)
Potassium: 3.7 mmol/L (ref 3.5–5.1)
Sodium: 136 mmol/L (ref 135–145)
Total Bilirubin: 0.4 mg/dL (ref 0.3–1.2)
Total Protein: 7.7 g/dL (ref 6.5–8.1)

## 2020-08-17 MED ORDER — SODIUM CHLORIDE 0.9 % IV SOLN
8.0000 mg | Freq: Once | INTRAVENOUS | Status: AC
  Administered 2020-08-17: 8 mg via INTRAVENOUS
  Filled 2020-08-17: qty 4

## 2020-08-17 MED ORDER — AMINO ACID RADIOPROTECTANT - L-LYSINE 2.5%/L-ARGININE 2.5% IN NS
250.0000 mL/h | INTRAVENOUS | Status: AC
  Administered 2020-08-17: 250 mL/h via INTRAVENOUS
  Filled 2020-08-17: qty 1000

## 2020-08-17 MED ORDER — LANREOTIDE ACETATE 120 MG/0.5ML ~~LOC~~ SOLN
SUBCUTANEOUS | Status: AC
  Administered 2020-08-17: 120 mg via SUBCUTANEOUS
  Filled 2020-08-17: qty 120

## 2020-08-17 MED ORDER — SODIUM CHLORIDE 0.9 % IV SOLN: 500.0000 mL | Freq: Once | INTRAVENOUS | Status: DC

## 2020-08-17 MED ORDER — LUTETIUM LU 177 DOTATATE 370 MBQ/ML IV SOLN: 200.0000 | Freq: Once | INTRAVENOUS | Status: DC

## 2020-08-17 MED ORDER — OCTREOTIDE ACETATE 500 MCG/ML IJ SOLN: 500.0000 ug | Freq: Once | INTRAMUSCULAR | Status: DC | PRN

## 2020-08-17 MED ORDER — LANREOTIDE ACETATE 120 MG/0.5ML ~~LOC~~ SOLN: 120.0000 mg | Freq: Once | SUBCUTANEOUS | Status: AC

## 2020-08-17 MED ORDER — ONDANSETRON HCL 8 MG PO TABS: 8.0000 mg | ORAL_TABLET | Freq: Two times a day (BID) | ORAL | 0 refills | Status: DC | PRN

## 2020-08-17 MED ORDER — PROCHLORPERAZINE EDISYLATE 10 MG/2ML IJ SOLN: 10.0000 mg | Freq: Four times a day (QID) | INTRAMUSCULAR | Status: DC | PRN

## 2020-08-17 MED ORDER — OCTREOTIDE ACETATE 30 MG IM KIT: 30.0000 mg | PACK | Freq: Once | INTRAMUSCULAR | Status: DC

## 2020-08-17 NOTE — Progress Notes (Signed)
The patient has received 1st Lutathera  dose without complications or distress. The appropriate staff was present during the time of the infusions.  Vital signs are stable.

## 2020-08-17 NOTE — Progress Notes (Signed)
CLINICAL DATA: [age]] year-old [male] with metastatic neuroendocrine tumor. Well differentiated tumor with somatostatin receptor is identified within the [liver] by DOTATATE PET CT scan. EXAM: NUCLEAR MEDICINE LUTATHERA ADMINISTRATION TECHNIQUE: Infusion: The nuclear medicine technologist and I personally verified the dose activity ([203] mCi) to be delivered as specified in the written directive (200 mCi), and verified the patient identification via 2 separate methods. 20 gauge IV were started in the antecubital veins. Anti-emetics were administered by nursing staff. Amino acid renal protection was initiated 30 minutes prior to Lu 177 DOTATATE (Lutathera) infusion and continued continuously for 4 hours. Lutathera infusion was administered over 30 minutes.   The total administered dose was [201.5] mCi Lu 177 DOTATATE.  The entire IV tubing, venocatheter, stopcock and syringes was removed in total, placed in a disposal bag and sent for assay of the residual activity, which will be reported at a later time in our EMR by the physics staff. Pressure was applied to the venipuncture sites, and a compression bandage placed. Radiation Safety personnel were present to perform the discharge survey, as detailed on their documentation.  Patient received 30 mg IM long-acting Sandostatin injection 4 hours after Lutathera effusion in the nuclear medicine department.  RADIOPHARMACEUTICALS:  [201.5] mCi Lu 177 DOTATATE  FINDINGS: Diagnosis: [Metastatic neuroendocrine tumor.]  Current Infusion: [1]  Planned Infusions: [4]  Patient reports [minimal] interval symptoms following therapy. The patient's most recent blood counts were reviewed and remains a good candidate to proceed with Lutathera. The patient was situated in an infusion suite and administered Lutathera as above. Patient will follow-up with referring oncologist for interval serum laboratories (CBC and CMP) in approximately 4 weeks.     Patient received 120 mg IM long-acting lanreotide injection 4 hours after Lutathera effusion in the nuclear medicine department.   IMPRESSION: [First] KN 397 [id number] treatment for metastatic neuroendocrine tumor. The patient tolerated the infusion well. The patient will return in 8 weeks for ongoing care.

## 2020-09-06 ENCOUNTER — Other Ambulatory Visit: Payer: Self-pay

## 2020-09-06 ENCOUNTER — Encounter: Payer: Self-pay | Admitting: Family Medicine

## 2020-09-06 ENCOUNTER — Ambulatory Visit (INDEPENDENT_AMBULATORY_CARE_PROVIDER_SITE_OTHER): Payer: 59 | Admitting: Family Medicine

## 2020-09-06 VITALS — BP 162/100 | HR 85 | Temp 97.1°F | Ht 69.0 in | Wt 268.2 lb

## 2020-09-06 DIAGNOSIS — I10 Essential (primary) hypertension: Secondary | ICD-10-CM | POA: Insufficient documentation

## 2020-09-06 DIAGNOSIS — E785 Hyperlipidemia, unspecified: Secondary | ICD-10-CM | POA: Insufficient documentation

## 2020-09-06 DIAGNOSIS — Z131 Encounter for screening for diabetes mellitus: Secondary | ICD-10-CM

## 2020-09-06 DIAGNOSIS — D3A011 Benign carcinoid tumor of the jejunum: Secondary | ICD-10-CM | POA: Diagnosis not present

## 2020-09-06 DIAGNOSIS — C7B02 Secondary carcinoid tumors of liver: Secondary | ICD-10-CM | POA: Diagnosis not present

## 2020-09-06 DIAGNOSIS — E119 Type 2 diabetes mellitus without complications: Secondary | ICD-10-CM | POA: Insufficient documentation

## 2020-09-06 DIAGNOSIS — F66 Other sexual disorders: Secondary | ICD-10-CM

## 2020-09-06 DIAGNOSIS — R7303 Prediabetes: Secondary | ICD-10-CM | POA: Insufficient documentation

## 2020-09-06 LAB — LIPID PANEL
Cholesterol: 172 mg/dL (ref 0–200)
HDL: 49.8 mg/dL (ref >39.00)
NonHDL: 122.67
Total CHOL/HDL Ratio: 3
Triglycerides: 244 mg/dL — ABNORMAL HIGH (ref 0.0–149.0)
VLDL: 48.8 mg/dL — ABNORMAL HIGH (ref 0.0–40.0)

## 2020-09-06 LAB — LDL CHOLESTEROL, DIRECT: Direct LDL: 88 mg/dL

## 2020-09-06 LAB — HEMOGLOBIN A1C: Hgb A1c MFr Bld: 6.4 % (ref 4.6–6.5)

## 2020-09-06 NOTE — Progress Notes (Signed)
[facility] [address] [name] [address] Dept: [phone] Dept Fax: [phone]  New Patient Office Visit  Subjective:    Patient ID: [name], male    DOB: 01/19/1971, 50 y.o..   MRN: [id number]  Chief Complaint  Patient presents with  . Establish Care    New patient, concerns about elevated blood pressures.     History of Present Illness:  Patient is in today to establish care. 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 He underwent resection and was treated with lanreotide. He was later found to have metastases to the liver, which have been progressive. He is now on Lutathera. The goals of his treatment are indicated to be palliative. The medication has been helpful in controlling carcinoid syndrome symptoms for him.  Mr. Dolin is currently on estrogen and progesterone. He notes that he identifies as non-binary gender. He feels the hormones have helped related to his identity with his feminine side. He does not plan to transition to male. He is okay with others addressing him with male pronouns.  Mr. Vassar has a history of hyperlipidemia. He is currently on Lipitor.  Mr. Wojdyla has a history of hypertension. He is currently on, amlodipine, carvedilol, and valsartan/HCTZ. Despite this, he notes his Bps have been quite high.  Past Medical History: Patient Active Problem List   Diagnosis Date Noted  . 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 Hyperlipidemia 09/06/2020  . Hypertension 09/06/2020  . Family history of carcinoid tumor   . 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 Bowel obstruction ([place]) 06/11/2019  . Rheumatoid arthritis involving multiple sites with positive rheumatoid factor ([place]) 10/13/2015   Past Surgical History:  Procedure Laterality Date  . HERNIA REPAIR    . LAPAROTOMY N/A 06/12/2019   Procedure:  EXPLORATORY LAPAROTOMY WITH SMALL BOWEL RESECTION;  Surgeon: Johnathan Hausen, MD;  Location: [facility];  Service: General;  Laterality: N/A;  . SHOULDER SURGERY    . SMALL INTESTINE SURGERY     Family History  Problem Relation Age of Onset  . Cancer Father        widespread carcinoid cancer  . Hypertension Mother   . Hyperlipidemia Mother    Outpatient Medications Prior to Visit  Medication Sig Dispense Refill  . amLODipine (NORVASC) 10 MG tablet TAKE ONE TABLET BY MOUTH DAILY 90 tablet 1  . atorvastatin (LIPITOR) 10 MG tablet TAKE ONE TABLET BY MOUTH DAILY AT 6:00 IN THE EVENING 30 tablet 3  . carvedilol (COREG) 3.125 MG tablet Take 1 tablet (3.125 mg total) by mouth 2 (two) times daily with a meal. 60 tablet 0  . estradiol (ESTRACE) 2 MG tablet DISSOLVE ONE TABLET UNDER THE TONGUE TWICE DAILY    . progesterone (PROMETRIUM) 100 MG capsule Take 100 mg by mouth at bedtime.    . valsartan-hydrochlorothiazide (DIOVAN-HCT) 160-25 MG tablet TAKE ONE TABLET BY MOUTH DAILY 90 tablet 1  . ondansetron (ZOFRAN) 8 MG tablet Take 1 tablet (8 mg total) by mouth 2 (two) times daily as needed for nausea or vomiting. (Patient not taking: Reported on 09/06/2020) 20 tablet 0   No facility-administered medications prior to visit.   No Known Allergies    Objective:   Today's Vitals   09/06/20 0941  BP: (!) 162/100  Pulse: 85  Temp: (!) 97.1 F (36.2 C)  TempSrc: Temporal  SpO2: 96%  Weight: 268 lb 3.2  oz (121.7 kg)  Height: 5\' 9"  (1.753 m)   Body mass index is 39.61 kg/m.   General: Well developed, well nourished. No acute distress. HEENT: Normocephalic, non-traumatic. PERRL, EOMI. Conjunctiva clear. External ears normal. EAC and TMs   normal bilaterally. Nose clear without congestion or rhinorrhea. Mucous membranes moist. Oropharynx clear. Good   dentition. Neck: Supple. No lymphadenopathy. No thyromegaly. Lungs: Clear to auscultation bilaterally. CV: RRR without murmurs or rubs. Pulses 2+  bilaterally. Abdomen: Soft, non-tender. No hepatosplenomegaly. No rebound or guarding. Well-healed surgical scar along the   vertical midline. Psych: Alert and oriented. Normal mood and affect.  Health Maintenance Due  Topic Date Due  . Hepatitis C Screening  Never done  . COLONOSCOPY (Pts 45-52yrs Insurance coverage will need to be confirmed)  Never done  . COVID-19 Vaccine (2 - Booster for Janssen series) 07/02/2020     Assessment & Plan:   1. Carcinoid tumor of jejunum, unspecified whether malignant 2. 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 Dr. Burr Medico (oncology) will follow this for now.  3. Hyperlipidemia, unspecified hyperlipidemia type We will check levels today.  - Lipid panel  4. Hypertension, unspecified type Mr. Petersen is on 4 different antihypertensives, but still has elevated pressures. The chemotherapy agents can cause hypertension in ~ 12% of patients, but this seems to be beyond what I would expect. I recommend a referral to cardiology to assess for secondary causes of HTN. I will plan to see him back after his cardiology evaluation.  - Ambulatory referral to Cardiology  5. Screening for diabetes mellitus (DM)  - Hemoglobin A1c  [name], MD

## 2020-09-07 ENCOUNTER — Encounter: Payer: Self-pay | Admitting: Family Medicine

## 2020-09-07 DIAGNOSIS — R7303 Prediabetes: Secondary | ICD-10-CM

## 2020-09-07 MED ORDER — METFORMIN HCL 500 MG PO TABS: 500.0000 mg | ORAL_TABLET | Freq: Every day | ORAL | 1 refills | Status: DC

## 2020-09-14 ENCOUNTER — Ambulatory Visit: Payer: 59 | Admitting: Family Medicine

## 2020-09-21 ENCOUNTER — Other Ambulatory Visit: Payer: Self-pay

## 2020-09-21 ENCOUNTER — Encounter: Payer: Self-pay | Admitting: Family Medicine

## 2020-09-21 DIAGNOSIS — E785 Hyperlipidemia, unspecified: Secondary | ICD-10-CM

## 2020-09-21 DIAGNOSIS — I10 Essential (primary) hypertension: Secondary | ICD-10-CM

## 2020-09-21 MED ORDER — ATORVASTATIN CALCIUM 10 MG PO TABS: ORAL_TABLET | ORAL | 1 refills | Status: DC

## 2020-09-21 MED ORDER — CARVEDILOL 3.125 MG PO TABS: 3.1250 mg | ORAL_TABLET | Freq: Two times a day (BID) | ORAL | 0 refills | Status: DC

## 2020-09-21 MED ORDER — VALSARTAN-HYDROCHLOROTHIAZIDE 160-25 MG PO TABS: 1.0000 | ORAL_TABLET | Freq: Every day | ORAL | 1 refills | Status: DC

## 2020-09-21 MED ORDER — AMLODIPINE BESYLATE 10 MG PO TABS: 10.0000 mg | ORAL_TABLET | Freq: Every day | ORAL | 1 refills | Status: DC

## 2020-10-06 ENCOUNTER — Ambulatory Visit (HOSPITAL_COMMUNITY)
Admission: RE | Admit: 2020-10-06 | Discharge: 2020-10-06 | Disposition: A | Discharge status: Home / Self Care | Payer: 59 | Source: Ambulatory Visit | Attending: Hematology | Admitting: Hematology

## 2020-10-06 ENCOUNTER — Telehealth: Payer: Self-pay

## 2020-10-06 DIAGNOSIS — D3A00 Benign carcinoid tumor of unspecified site: Secondary | ICD-10-CM | POA: Insufficient documentation

## 2020-10-06 LAB — CBC WITH DIFFERENTIAL/PLATELET
Abs Immature Granulocytes: 0.02 10*3/uL (ref 0.00–0.07)
Basophils Absolute: 0 10*3/uL (ref 0.0–0.1)
Basophils Relative: 1 %
Eosinophils Absolute: 0.3 10*3/uL (ref 0.0–0.5)
Eosinophils Relative: 4 %
HCT: 39.9 % (ref 39.0–52.0)
Hemoglobin: 12.9 g/dL — ABNORMAL LOW (ref 13.0–17.0)
Immature Granulocytes: 0 %
Lymphocytes Relative: 13 %
Lymphs Abs: 0.9 10*3/uL (ref 0.7–4.0)
MCH: 29.1 pg (ref 26.0–34.0)
MCHC: 32.3 g/dL (ref 30.0–36.0)
MCV: 89.9 fL (ref 80.0–100.0)
Monocytes Absolute: 0.6 10*3/uL (ref 0.1–1.0)
Monocytes Relative: 9 %
Neutro Abs: 4.9 10*3/uL (ref 1.7–7.7)
Neutrophils Relative %: 73 %
Platelets: 274 10*3/uL (ref 150–400)
RBC: 4.44 MIL/uL (ref 4.22–5.81)
RDW: 13.5 % (ref 11.5–15.5)
WBC: 6.7 10*3/uL (ref 4.0–10.5)
nRBC: 0 % (ref 0.0–0.2)

## 2020-10-06 LAB — COMPREHENSIVE METABOLIC PANEL
ALT: 21 U/L (ref 0–44)
AST: 19 U/L (ref 15–41)
Albumin: 4 g/dL (ref 3.5–5.0)
Alkaline Phosphatase: 50 U/L (ref 38–126)
Anion gap: 8 (ref 5–15)
BUN: 21 mg/dL — ABNORMAL HIGH (ref 6–20)
CO2: 27 mmol/L (ref 22–32)
Calcium: 9.1 mg/dL (ref 8.9–10.3)
Chloride: 99 mmol/L (ref 98–111)
Creatinine, Ser: 1.08 mg/dL (ref 0.61–1.24)
GFR, Estimated: >60 mL/min (ref >60)
Glucose, Bld: 196 mg/dL — ABNORMAL HIGH (ref 70–99)
Potassium: 3.7 mmol/L (ref 3.5–5.1)
Sodium: 134 mmol/L — ABNORMAL LOW (ref 135–145)
Total Bilirubin: 0.5 mg/dL (ref 0.3–1.2)
Total Protein: 7.4 g/dL (ref 6.5–8.1)

## 2020-10-06 IMAGING — NM NM [PERSON_NAME] ADMINISTRATION
1 series · 1 of 1 positions shown · non-contrast
Comparison: none

CLINICAL DATA: [age] year-old male with metastatic
neuroendocrine tumor. Well differentiated tumor with somatostatin
receptor is identified within the liver by DOTATATE PET CT scan.

EXAM:
NUCLEAR MEDICINE LUTATHERA ADMINISTRATION
TECHNIQUE: Infusion: The nuclear medicine technologist and I personally
verified the dose activity (201 mCi) to be delivered as specified in
the written directive (200 mCi), and verified the patient
identification via 2 separate methods. 20 gauge IV were started in
the antecubital veins. Anti-emetics were administered by nursing
staff. TIGER acid renal protection was initiated 30 minutes prior to
Lu 177 DOTATATE (Lutathera) infusion and continued continuously for
4 hours. Lutathera infusion was administered over 30 minutes.

[Series 1: static · 2.07mm/px · 1 of 1 slices shown]
[im 1/1]
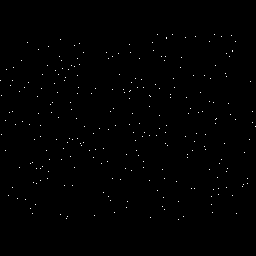

[1 of 1 positions shown; findings below may reference images not displayed]

The total administered dose was 197.3 mCi Lu 177 DOTATATE.

The entire IV tubing, venocatheter, stopcock and syringes was
removed in total, placed in a disposal bag and sent for assay of the
residual activity, which will be reported at a later time in our EMR
by the physics staff. Pressure was applied to the venipuncture
sites, and a compression bandage placed. Radiation Safety personnel
were present to perform the discharge survey, as detailed on their
documentation.

Patient received 120 mg IM long-acting lanreotide injection 4 hours
after Lutathera effusion in the nuclear medicine department.

RADIOPHARMACEUTICALS:  197.3 mCi Lu 177 DOTATATE
FINDINGS: Diagnosis: Metastatic neuroendocrine tumor.

Current Infusion: 2

Planned Infusions: 4

Patient reports minimal fatigue symptoms symptoms following therapy.
Moderate diarrhea reported. The patient's most recent blood counts
were reviewed and remains a good candidate to proceed with
Lutathera. The patient was situated in an infusion suite and
administered Lutathera as above. Patient will follow-up with
referring oncologist for interval serum laboratories (CBC and CMP)
in approximately 4 weeks.

Patient received 120 mg IM long-acting lanreotide injection 4 hours
after Lutathera effusion in the nuclear medicine department.
IMPRESSION: Second Lu 177 DOTATATE treatment for metastatic neuroendocrine
tumor. The patient tolerated the infusion well. The patient will
return in 8 weeks for ongoing care.

## 2020-10-06 MED ORDER — OCTREOTIDE ACETATE 30 MG IM KIT: 30.0000 mg | PACK | Freq: Once | INTRAMUSCULAR | Status: DC

## 2020-10-06 MED ORDER — ONDANSETRON HCL 8 MG PO TABS: 8.0000 mg | ORAL_TABLET | Freq: Two times a day (BID) | ORAL | 0 refills | Status: DC | PRN

## 2020-10-06 MED ORDER — OCTREOTIDE ACETATE 500 MCG/ML IJ SOLN: 500.0000 ug | Freq: Once | INTRAMUSCULAR | Status: DC | PRN

## 2020-10-06 MED ORDER — AMINO ACID RADIOPROTECTANT - L-LYSINE 2.5%/L-ARGININE 2.5% IN NS
250.0000 mL/h | INTRAVENOUS | Status: AC
  Administered 2020-10-06: 250 mL/h via INTRAVENOUS
  Filled 2020-10-06: qty 1000

## 2020-10-06 MED ORDER — SODIUM CHLORIDE 0.9 % IV SOLN
8.0000 mg | Freq: Once | INTRAVENOUS | Status: AC
  Administered 2020-10-06: 8 mg via INTRAVENOUS
  Filled 2020-10-06: qty 4

## 2020-10-06 MED ORDER — SODIUM CHLORIDE 0.9 % IV SOLN: 500.0000 mL | Freq: Once | INTRAVENOUS | Status: DC

## 2020-10-06 MED ORDER — PROCHLORPERAZINE EDISYLATE 10 MG/2ML IJ SOLN: 10.0000 mg | Freq: Four times a day (QID) | INTRAMUSCULAR | Status: DC | PRN

## 2020-10-06 MED ORDER — LUTETIUM LU 177 DOTATATE 370 MBQ/ML IV SOLN
200.0000 | Freq: Once | INTRAVENOUS | Status: AC
  Administered 2020-10-06: 197.3 via INTRAVENOUS

## 2020-10-06 MED ORDER — LANREOTIDE ACETATE 120 MG/0.5ML ~~LOC~~ SOLN
SUBCUTANEOUS | Status: AC
  Administered 2020-10-06: 120 mg via INTRAMUSCULAR
  Filled 2020-10-06: qty 120

## 2020-10-06 NOTE — Progress Notes (Signed)
CLINICAL DATA: [age]] year[age]old [male] with metastatic neuroendocrine tumor. Well differentiated tumor with somatostatin receptor is identified within the [liver] by DOTATATE PET CT scan. EXAM: NUCLEAR MEDICINE LUTATHERA ADMINISTRATION TECHNIQUE: Infusion: The nuclear medicine technologist and I personally verified the dose activity ([201] mCi) to be delivered as specified in the written directive (200 mCi), and verified the patient identification via 2 separate methods. 20 gauge IV were started in the antecubital veins. Anti-emetics were administered by nursing staff. Amino acid renal protection was initiated 30 minutes prior to Lu 177 DOTATATE (Lutathera) infusion and continued continuously for 4 hours. Lutathera infusion was administered over 30 minutes.   The total administered dose was [197.3] mCi Lu 177 DOTATATE.  The entire IV tubing, venocatheter, stopcock and syringes was removed in total, placed in a disposal bag and sent for assay of the residual activity, which will be reported at a later time in our EMR by the physics staff. Pressure was applied to the venipuncture sites, and a compression bandage placed. Radiation Safety personnel were present to perform the discharge survey, as detailed on their documentation.  Patient received 120 mg IM long-acting lanreotide injection 4 hours after Lutathera effusion in the nuclear medicine department.  RADIOPHARMACEUTICALS:  [197.3] mCi Lu 177 DOTATATE  FINDINGS: Diagnosis: [Metastatic neuroendocrine tumor.]  Current Infusion: [2]  Planned Infusions: [4]  Patient reports [minimal] fatigue symptoms symptoms following therapy.  Moderate diarrhea reported.  The patient's most recent blood counts were reviewed and remains a good candidate to proceed with Lutathera. The patient was situated in an infusion suite and administered Lutathera as above. Patient will follow-up with referring oncologist for interval serum laboratories (CBC and  CMP) in approximately 4 weeks.    Patient received 120 mg IM long-acting lanreotide injection 4 hours after Lutathera effusion in the nuclear medicine department.   IMPRESSION: [Second] WO 032 [id number] treatment for metastatic neuroendocrine tumor. The patient tolerated the infusion well. The patient will return in 8 weeks for ongoing care.

## 2020-10-06 NOTE — Progress Notes (Signed)
The patient has received Lutathera dose without complications or distress. Lanreotide injection given prior to discharge. The appropriate staff was present during this test. VSS.

## 2020-10-06 NOTE — Telephone Encounter (Signed)
Received call from [facility] in nuclear medicine.  She states that Dwayne Sawyer is experiencing gi symptoms since starting lutethera and stopping Sandostatin injections.  Nuclear medicine is giving Sandostatin today and requesting we schedule for injection at [facility] next month.  Scheduling message sent to add Sandostatin injection appt to 4/14 lab appt.

## 2020-10-11 ENCOUNTER — Ambulatory Visit (INDEPENDENT_AMBULATORY_CARE_PROVIDER_SITE_OTHER): Payer: 59 | Admitting: Cardiovascular Disease

## 2020-10-11 ENCOUNTER — Encounter: Payer: Self-pay | Admitting: Family Medicine

## 2020-10-11 ENCOUNTER — Encounter: Payer: Self-pay | Admitting: Cardiovascular Disease

## 2020-10-11 ENCOUNTER — Other Ambulatory Visit: Payer: Self-pay

## 2020-10-11 VITALS — BP 156/84 | HR 69

## 2020-10-11 DIAGNOSIS — D3A011 Benign carcinoid tumor of the jejunum: Secondary | ICD-10-CM | POA: Diagnosis not present

## 2020-10-11 DIAGNOSIS — I10 Essential (primary) hypertension: Secondary | ICD-10-CM

## 2020-10-11 DIAGNOSIS — R0683 Snoring: Secondary | ICD-10-CM | POA: Diagnosis not present

## 2020-10-11 DIAGNOSIS — R400 Somnolence: Secondary | ICD-10-CM

## 2020-10-11 DIAGNOSIS — E785 Hyperlipidemia, unspecified: Secondary | ICD-10-CM

## 2020-10-11 LAB — TSH: TSH: 1.96 u[IU]/mL (ref 0.450–4.500)

## 2020-10-11 MED ORDER — CARVEDILOL 12.5 MG PO TABS: 12.5000 mg | ORAL_TABLET | Freq: Two times a day (BID) | ORAL | 1 refills | Status: DC

## 2020-10-11 NOTE — Progress Notes (Signed)
Advanced Hypertension Clinic Initial Assessment:    Date:  10/11/2020   ID:  Dwayne Sawyer, DOB [birth year], MRN [id number]  PCP:  [name], MD  Cardiologist:  No primary care provider on file.  Nephrologist:  Referring MD: [name], MD   CC: Hypertension  History of Present Illness:    Dwayne Sawyer is a 50 y.o. adult with a hx of carcinoid tumor, hypertension, hyperlipidemia, and RA here to establish care in the hypertension clinic.  He was first diagnosed with hypertension 15 or 20 years ago.  Initially his blood pressure was fairly easy to control.  However in the last few years it has been more difficult.  He thinks that this started after he was diagnosed with carcinoid syndrome.  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  He will underwent resection and was treated with lanreotide.  He continues to take this monthly to control his symptoms, though it did not stop tumor progression.  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  He has not noted any association with the timing of his next treatment and his blood pressure.  It seems to be consistently elevated.  He has been trying to exercise more.  He walks and rides bike 45-60 minutes five days per week.  He feels good with exercise and has no CP/SOB.  He has no LE edema, orthopnea or PND.  He notes that he does snore and his wife reports he has apnea.  He mostly cooks at home and does limit his sodium intake.  He does not use any NSAIDs and no longer uses tobacco products.  His blood pressure at home lately has been in the 150s over 90s to 100s.  Per clinic notes he identifies as nonbinary and is therefore on estrogen and progesterone.  He was previously in the [employer].  Previous antihypertensives: N/A   Past Medical History:  Diagnosis Date  . Arthritis   . Cancer ([place])   . Family history of carcinoid tumor   . High cholesterol    . Hypertension     Past Surgical History:  Procedure Laterality Date  . HERNIA REPAIR    . LAPAROTOMY N/A 06/12/2019   Procedure: EXPLORATORY LAPAROTOMY WITH SMALL BOWEL RESECTION;  Surgeon: Johnathan Hausen, MD;  Location: [facility];  Service: General;  Laterality: N/A;  . SHOULDER SURGERY    . SMALL INTESTINE SURGERY      Current Medications: Current Meds  Medication Sig  . amLODipine (NORVASC) 10 MG tablet Take 1 tablet (10 mg total) by mouth daily.  Marland Kitchen atorvastatin (LIPITOR) 10 MG tablet TAKE ONE TABLET BY MOUTH DAILY AT 6:00 IN THE EVENING  . estradiol (ESTRACE) 2 MG tablet DISSOLVE ONE TABLET UNDER THE TONGUE TWICE DAILY  . metFORMIN (GLUCOPHAGE) 500 MG tablet Take 1 tablet (500 mg total) by mouth daily with breakfast.  . ondansetron (ZOFRAN) 8 MG tablet Take 1 tablet (8 mg total) by mouth 2 (two) times daily as needed for nausea or vomiting.  . progesterone (PROMETRIUM) 100 MG capsule Take 100 mg by mouth at bedtime.  . valsartan-hydrochlorothiazide (DIOVAN-HCT) 160-25 MG tablet Take 1 tablet by mouth daily.  . [DISCONTINUED] carvedilol (COREG) 3.125 MG tablet Take 1 tablet (3.125 mg total) by mouth 2 (two) times daily with a meal.     Allergies:   Patient has no known allergies.   Social History   Socioeconomic History  .  Marital status: Married    Spouse name: Not on file  . Number of children: Not on file  . Years of education: Not on file  . Highest education level: Not on file  Occupational History  . Not on file  Tobacco Use  . Smoking status: Never Smoker  . Smokeless tobacco: Current User    Types: Chew  Vaping Use  . Vaping Use: Never used  Substance and Sexual Activity  . Alcohol use: Yes    Comment: occ  . Drug use: Never  . Sexual activity: Not on file  Other Topics Concern  . Not on file  Social History Narrative  . Not on file   Social Determinants of Health   Financial Resource Strain: Low Risk   . Difficulty of Paying Living Expenses: Not  very hard  Food Insecurity: No Food Insecurity  . Worried About Charity fundraiser in the Last Year: Never true  . Ran Out of Food in the Last Year: Never true  Transportation Needs: No Transportation Needs  . Lack of Transportation (Medical): No  . Lack of Transportation (Non-Medical): No  Physical Activity: Sufficiently Active  . Days of Exercise per Week: 5 days  . Minutes of Exercise per Session: 60 min  Stress: No Stress Concern Present  . Feeling of Stress : Only a little  Social Connections: Not on file     Family History: The patient's family history includes Cancer in [place] Yanko's father; Hyperlipidemia in [place] Pruden's mother; Hypertension in [place] Knittle's mother.  ROS:   Please see the history of present illness.    All other systems reviewed and are negative.  EKGs/Labs/Other Studies Reviewed:    EKG:  EKG is ordered today.  The ekg ordered today demonstrates sinus rhythm.  Rate 69 bpm.  Recent Labs: 10/06/2020: ALT 21; BUN 21; Creatinine, Ser 1.08; Hemoglobin 12.9; Platelets 274; Potassium 3.7; Sodium 134   Recent Lipid Panel    Component Value Date/Time   CHOL 172 09/06/2020 1046   TRIG 244.0 (H) 09/06/2020 1046   HDL 49.80 09/06/2020 1046   CHOLHDL 3 09/06/2020 1046   VLDL 48.8 (H) 09/06/2020 1046   LDLDIRECT 88.0 09/06/2020 1046    Physical Exam:   VS:  BP (!) 156/84   Pulse 69  , BMI There is no height or weight on file to calculate BMI. GENERAL:  Well appearing HEENT: Pupils equal round and reactive, fundi not visualized, oral mucosa unremarkable NECK:  No jugular venous distention, waveform within normal limits, carotid upstroke brisk and symmetric, no bruits, no thyromegaly LYMPHATICS:  No cervical adenopathy LUNGS:  Clear to auscultation bilaterally HEART:  RRR.  PMI not displaced or sustained,S1 and S2 within normal limits, no S3, no S4, no clicks, no rubs, no murmurs ABD:  Flat, positive bowel sounds normal in frequency in pitch, no  bruits, no rebound, no guarding, no midline pulsatile mass, no hepatomegaly, no splenomegaly EXT:  2 plus pulses throughout, no edema, no cyanosis no clubbing SKIN:  No rashes no nodules NEURO:  Cranial nerves II through XII grossly intact, motor grossly intact throughout PSYCH:  Cognitively intact, oriented to person place and time   ASSESSMENT:    1. Snoring   2. Essential hypertension   3. Daytime somnolence   4. Hypertension, unspecified type   5. Carcinoid tumor of jejunum, unspecified whether malignant   6. Hyperlipidemia, unspecified hyperlipidemia type     PLAN:    # Resistant hypertension Mr. Lenzen blood  pressure is poorly controlled despite being on more than 3 medications.  I suspect that this is related to his medications.  He is on Lutathera and lanreotide which are both for management of his metastatic carcinoid tumor.  However they are both associated with hypertension.  Estrogen and progesterone therapy are also associated with hypertension.  I doubt that there are any alternative agents for either of these indications, though would urge his team to consider changing if there are.  In the meantime, we will also look for other secondary causes of hypertension.  We will check a TSH and get a sleep study.  We will also get renal artery Dopplers.  He was encouraged to keep up his excellent exercise routine and limiting sodium intake to 1500 mg sodium daily.  Screening for Secondary Hypertension:  Causes 10/11/2020  Drugs/Herbals Screened     - Comments carcinoid meds, estrogen, progesterone  Renovascular HTN Screened     - Comments Check renal artery Dopplers  Sleep Apnea Screened     - Comments We will get sleep study for snoring and apnea  Hyperthyroidism Screened     - Comments Check TSH  Hypothyroidism Screened     - Comments Check TSH  Hyperaldosteronism Not Screened  Pheochromocytoma Not Screened  Cushing's Syndrome Not Screened  Hyperparathyroidism Not  Screened  Coarctation of the Aorta Screened     - Comments BP symmetric  Compliance Screened    Relevant Labs/Studies: Basic Labs Latest Ref Rng & Units 10/06/2020 08/17/2020 08/05/2020  Sodium 135 - 145 mmol/L 134(L) 136 138  Potassium 3.5 - 5.1 mmol/L 3.7 3.7 3.9  Creatinine 0.61 - 1.24 mg/dL 1.08 0.96 1.17                Renovascular  10/11/2020  Renal Artery [place] Completed Yes    # Carcinoid:  No evidence of carcinoid valvular heart disease on exam or by history.  # Hyperlipidemia:  Continue atorvastatin.   Time spent: 40 minutes-Greater than 50% of this time was spent in counseling, explanation of diagnosis, planning of further management, and coordination of care.   Disposition:    FU with MD/PharmD in 1 month    Medication Adjustments/Labs and Tests Ordered: Current medicines are reviewed at length with the patient today.  Concerns regarding medicines are outlined above.  Orders Placed This Encounter  Procedures  . TSH  . EKG 12-Lead  . Split night study  . VAS US RENAL ARTERY DUPLEX   Meds ordered this encounter  Medications  . carvedilol (COREG) 12.5 MG tablet    Sig: Take 1 tablet (12.5 mg total) by mouth 2 (two) times daily with a meal.    Dispense:  180 tablet    Refill:  1    NEW DOSE, D/C 3.125 MG RX     Signed, Skeet Latch, MD  10/11/2020 10:14 AM    [place]

## 2020-10-11 NOTE — Patient Instructions (Signed)
Medication Instructions:  INCREASE YOUR CARVEDILOL TO 12.5 MG TWICE A DAY    Labwork: TSH TODAY   YOU WILL NEED COVID SCREENING 3 DAYS PRIOR TO SLEEP STUDY    Testing/Procedures: Your physician has requested that you have a renal artery duplex. During this test, an ultrasound is used to evaluate blood flow to the kidneys. Allow one hour for this exam. Do not eat after midnight the day before and avoid carbonated beverages. Take your medications as you usually do.  Your physician has recommended that you have a sleep study. This test records several body functions during sleep, including: brain activity, eye movement, oxygen and carbon dioxide blood levels, heart rate and rhythm, breathing rate and rhythm, the flow of air through your mouth and nose, snoring, body muscle movements, and chest and belly movement. THE OFFICE WILL CONTACT YOU ONCE YOUR INSURANCE HAS APPROVED. IF YOU HAVE NOT HEARD ANYTHING IN 2 WEEKS CALL TO FOLLOW UP    Follow-Up: 11/11/2020 AT 9:00 AM WITH PHARM D   Special Instructions:   MONITOR YOUR BLOOD PRESSURE TWICE A DAY, LOG IN THE BOOK PROVIDED. BRING THE BOOK AND YOUR BLOOD PRESSURE MACHINE TO YOUR FOLLOW UP IN 1 MONTH   DASH Eating Plan DASH stands for "Dietary Approaches to Stop Hypertension." The DASH eating plan is a healthy eating plan that has been shown to reduce high blood pressure (hypertension). It may also reduce your risk for type 2 diabetes, heart disease, and stroke. The DASH eating plan may also help with weight loss. What are tips for following this plan?  General guidelines  Avoid eating more than 2,300 mg (milligrams) of salt (sodium) a day. If you have hypertension, you may need to reduce your sodium intake to 1,500 mg a day.  Limit alcohol intake to no more than 1 drink a day for nonpregnant women and 2 drinks a day for men. One drink equals 12 oz of beer, 5 oz of wine, or 1 oz of hard liquor.  Work with your health care provider to maintain  a healthy body weight or to lose weight. Ask what an ideal weight is for you.  Get at least 30 minutes of exercise that causes your heart to beat faster (aerobic exercise) most days of the week. Activities may include walking, swimming, or biking.  Work with your health care provider or diet and nutrition specialist (dietitian) to adjust your eating plan to your individual calorie needs. Reading food labels   Check food labels for the amount of sodium per serving. Choose foods with less than 5 percent of the Daily Value of sodium. Generally, foods with less than 300 mg of sodium per serving fit into this eating plan.  To find whole grains, look for the word "whole" as the first word in the ingredient list. Shopping  Buy products labeled as "low-sodium" or "no salt added."  Buy fresh foods. Avoid canned foods and premade or frozen meals. Cooking  Avoid adding salt when cooking. Use salt-free seasonings or herbs instead of table salt or sea salt. Check with your health care provider or pharmacist before using salt substitutes.  Do not fry foods. Cook foods using healthy methods such as baking, boiling, grilling, and broiling instead.  Cook with heart-healthy oils, such as olive, canola, soybean, or sunflower oil. Meal planning  Eat a balanced diet that includes: ? 5 or more servings of fruits and vegetables each day. At each meal, try to fill half of your plate with fruits  and vegetables. ? Up to 6-8 servings of whole grains each day. ? Less than 6 oz of lean meat, poultry, or fish each day. A 3-oz serving of meat is about the same size as a deck of cards. One egg equals 1 oz. ? 2 servings of low-fat dairy each day. ? A serving of nuts, seeds, or beans 5 times each week. ? Heart-healthy fats. Healthy fats called Omega-3 fatty acids are found in foods such as flaxseeds and coldwater fish, like sardines, salmon, and mackerel.  Limit how much you eat of the following: ? Canned or  prepackaged foods. ? Food that is high in trans fat, such as fried foods. ? Food that is high in saturated fat, such as fatty meat. ? Sweets, desserts, sugary drinks, and other foods with added sugar. ? Full-fat dairy products.  Do not salt foods before eating.  Try to eat at least 2 vegetarian meals each week.  Eat more home-cooked food and less restaurant, buffet, and fast food.  When eating at a restaurant, ask that your food be prepared with less salt or no salt, if possible. What foods are recommended? The items listed may not be a complete list. Talk with your dietitian about what dietary choices are best for you. Grains Whole-grain or whole-wheat bread. Whole-grain or whole-wheat pasta. Brown rice. [place]. Bulgur. Whole-grain and low-sodium cereals. Pita bread. Low-fat, low-sodium crackers. Whole-wheat flour tortillas. Vegetables Fresh or frozen vegetables (raw, steamed, roasted, or grilled). Low-sodium or reduced-sodium tomato and vegetable juice. Low-sodium or reduced-sodium tomato sauce and tomato paste. Low-sodium or reduced-sodium canned vegetables. Fruits All fresh, dried, or frozen fruit. Canned fruit in natural juice (without added sugar). Meat and other protein foods Skinless chicken or [place]. Ground chicken or [place]. Pork with fat trimmed off. Fish and seafood. Egg whites. Dried beans, peas, or lentils. Unsalted nuts, nut butters, and seeds. Unsalted canned beans. Lean cuts of beef with fat trimmed off. Low-sodium, lean deli meat. Dairy Low-fat (1%) or fat-free (skim) milk. Fat-free, low-fat, or reduced-fat cheeses. Nonfat, low-sodium ricotta or cottage cheese. Low-fat or nonfat yogurt. Low-fat, low-sodium cheese. Fats and oils Soft margarine without trans fats. Vegetable oil. Low-fat, reduced-fat, or light mayonnaise and salad dressings (reduced-sodium). Canola, safflower, olive, soybean, and sunflower oils. Avocado. Seasoning and other foods Herbs. Spices.  Seasoning mixes without salt. Unsalted popcorn and pretzels. Fat-free sweets. What foods are not recommended? The items listed may not be a complete list. Talk with your dietitian about what dietary choices are best for you. Grains Baked goods made with fat, such as croissants, muffins, or some breads. Dry pasta or rice meal packs. Vegetables Creamed or fried vegetables. Vegetables in a cheese sauce. Regular canned vegetables (not low-sodium or reduced-sodium). Regular canned tomato sauce and paste (not low-sodium or reduced-sodium). Regular tomato and vegetable juice (not low-sodium or reduced-sodium). Angie Fava. Olives. Fruits Canned fruit in a light or heavy syrup. Fried fruit. Fruit in cream or butter sauce. Meat and other protein foods Fatty cuts of meat. Ribs. Fried meat. Berniece Salines. Sausage. Bologna and other processed lunch meats. Salami. Fatback. Hotdogs. Bratwurst. Salted nuts and seeds. Canned beans with added salt. Canned or smoked fish. Whole eggs or egg yolks. Chicken or [place] with skin. Dairy Whole or 2% milk, cream, and half-and-half. Whole or full-fat cream cheese. Whole-fat or sweetened yogurt. Full-fat cheese. Nondairy creamers. Whipped toppings. Processed cheese and cheese spreads. Fats and oils Butter. Stick margarine. Lard. Shortening. Ghee. Bacon fat. Tropical oils, such as coconut, palm kernel,  or palm oil. Seasoning and other foods Salted popcorn and pretzels. Onion salt, garlic salt, seasoned salt, table salt, and sea salt. Worcestershire sauce. Tartar sauce. Barbecue sauce. Teriyaki sauce. Soy sauce, including reduced-sodium. Steak sauce. Canned and packaged gravies. Fish sauce. Oyster sauce. Cocktail sauce. Horseradish that you find on the shelf. Ketchup. Mustard. Meat flavorings and tenderizers. Bouillon cubes. Hot sauce and Tabasco sauce. Premade or packaged marinades. Premade or packaged taco seasonings. Relishes. Regular salad dressings. Where to find more  information:  National Heart, Lung, and [place]: https://wilson-eaton.com/  American Heart Association: www.heart.org Summary  The DASH eating plan is a healthy eating plan that has been shown to reduce high blood pressure (hypertension). It may also reduce your risk for type 2 diabetes, heart disease, and stroke.  With the DASH eating plan, you should limit salt (sodium) intake to 2,300 mg a day. If you have hypertension, you may need to reduce your sodium intake to 1,500 mg a day.  When on the DASH eating plan, aim to eat more fresh fruits and vegetables, whole grains, lean proteins, low-fat dairy, and heart-healthy fats.  Work with your health care provider or diet and nutrition specialist (dietitian) to adjust your eating plan to your individual calorie needs. This information is not intended to replace advice given to you by your health care provider. Make sure you discuss any questions you have with your health care provider. Document Released: 06/29/2011 Document Revised: 06/22/2017 Document Reviewed: 07/03/2016 Elsevier Patient Education  2020 [employer].

## 2020-10-12 ENCOUNTER — Telehealth: Payer: Self-pay | Admitting: *Deleted

## 2020-10-12 ENCOUNTER — Other Ambulatory Visit: Payer: Self-pay | Admitting: Cardiovascular Disease

## 2020-10-12 DIAGNOSIS — I10 Essential (primary) hypertension: Secondary | ICD-10-CM

## 2020-10-12 DIAGNOSIS — R0683 Snoring: Secondary | ICD-10-CM

## 2020-10-12 DIAGNOSIS — R400 Somnolence: Secondary | ICD-10-CM

## 2020-10-12 NOTE — Telephone Encounter (Signed)
Faxed PA request for sleep study to patient's [employer] @ [phone].

## 2020-10-13 ENCOUNTER — Telehealth: Payer: Self-pay | Admitting: *Deleted

## 2020-10-13 NOTE — Telephone Encounter (Signed)
Patient notified of HST appointment details. °

## 2020-10-21 ENCOUNTER — Ambulatory Visit (HOSPITAL_COMMUNITY)
Admission: RE | Admit: 2020-10-21 | Discharge: 2020-10-21 | Disposition: A | Discharge status: Home / Self Care | Payer: 59 | Source: Ambulatory Visit | Attending: Cardiology | Admitting: Cardiology

## 2020-10-21 ENCOUNTER — Other Ambulatory Visit: Payer: Self-pay

## 2020-10-21 DIAGNOSIS — I10 Essential (primary) hypertension: Secondary | ICD-10-CM | POA: Insufficient documentation

## 2020-11-04 ENCOUNTER — Other Ambulatory Visit: Payer: Self-pay

## 2020-11-04 ENCOUNTER — Inpatient Hospital Stay: Payer: 59

## 2020-11-04 ENCOUNTER — Inpatient Hospital Stay: Payer: 59 | Attending: Nurse Practitioner

## 2020-11-04 VITALS — BP 153/96 | HR 81 | Temp 98.3°F | Resp 18

## 2020-11-04 DIAGNOSIS — R197 Diarrhea, unspecified: Secondary | ICD-10-CM | POA: Insufficient documentation

## 2020-11-04 DIAGNOSIS — C7A011 Malignant carcinoid tumor of the jejunum: Secondary | ICD-10-CM

## 2020-11-04 DIAGNOSIS — C7A8 Other malignant neuroendocrine tumors: Secondary | ICD-10-CM | POA: Insufficient documentation

## 2020-11-04 DIAGNOSIS — D3A011 Benign carcinoid tumor of the jejunum: Secondary | ICD-10-CM

## 2020-11-04 DIAGNOSIS — C7B8 Other secondary neuroendocrine tumors: Secondary | ICD-10-CM | POA: Diagnosis not present

## 2020-11-04 LAB — CMP (CANCER CENTER ONLY)
ALT: 17 U/L (ref 0–44)
AST: 16 U/L (ref 15–41)
Albumin: 4 g/dL (ref 3.5–5.0)
Alkaline Phosphatase: 58 U/L (ref 38–126)
Anion gap: 12 (ref 5–15)
BUN: 18 mg/dL (ref 6–20)
CO2: 28 mmol/L (ref 22–32)
Calcium: 9.5 mg/dL (ref 8.9–10.3)
Chloride: 99 mmol/L (ref 98–111)
Creatinine: 1 mg/dL (ref 0.61–1.24)
GFR, Estimated: >60 mL/min (ref >60)
Glucose, Bld: 142 mg/dL — ABNORMAL HIGH (ref 70–99)
Potassium: 4.4 mmol/L (ref 3.5–5.1)
Sodium: 139 mmol/L (ref 135–145)
Total Bilirubin: 0.4 mg/dL (ref 0.3–1.2)
Total Protein: 7.6 g/dL (ref 6.5–8.1)

## 2020-11-04 LAB — CBC WITH DIFFERENTIAL (CANCER CENTER ONLY)
Abs Immature Granulocytes: 0.03 10*3/uL (ref 0.00–0.07)
Basophils Absolute: 0.1 10*3/uL (ref 0.0–0.1)
Basophils Relative: 1 %
Eosinophils Absolute: 0.3 10*3/uL (ref 0.0–0.5)
Eosinophils Relative: 3 %
HCT: 39.9 % (ref 39.0–52.0)
Hemoglobin: 13.3 g/dL (ref 13.0–17.0)
Immature Granulocytes: 0 %
Lymphocytes Relative: 7 %
Lymphs Abs: 0.7 10*3/uL (ref 0.7–4.0)
MCH: 29.2 pg (ref 26.0–34.0)
MCHC: 33.3 g/dL (ref 30.0–36.0)
MCV: 87.5 fL (ref 80.0–100.0)
Monocytes Absolute: 0.9 10*3/uL (ref 0.1–1.0)
Monocytes Relative: 9 %
Neutro Abs: 7.9 10*3/uL — ABNORMAL HIGH (ref 1.7–7.7)
Neutrophils Relative %: 80 %
Platelet Count: 243 10*3/uL (ref 150–400)
RBC: 4.56 MIL/uL (ref 4.22–5.81)
RDW: 13.6 % (ref 11.5–15.5)
WBC Count: 9.8 10*3/uL (ref 4.0–10.5)
nRBC: 0 % (ref 0.0–0.2)

## 2020-11-04 MED ORDER — LANREOTIDE ACETATE 120 MG/0.5ML ~~LOC~~ SOLN
120.0000 mg | Freq: Once | SUBCUTANEOUS | Status: AC
  Administered 2020-11-04: 120 mg via SUBCUTANEOUS
  Filled 2020-11-04: qty 120

## 2020-11-04 NOTE — Patient Instructions (Signed)
Lanreotide injection What is this medicine? LANREOTIDE (lan REE oh tide) is used to reduce blood levels of growth hormone in patients with a condition called acromegaly. It also works to slow or stop tumor growth in patients with neuroendocrine tumors and treat carcinoid syndrome. This medicine may be used for other purposes; ask your health care provider or pharmacist if you have questions. COMMON BRAND NAME(S): Somatuline Depot What should I tell my health care provider before I take this medicine? They need to know if you have any of these conditions:  diabetes  gallbladder disease  heart disease  kidney disease  liver disease  thyroid disease  an unusual or allergic reaction to lanreotide, other medicines, foods, dyes, or preservatives  pregnant or trying to get pregnant  breast-feeding How should I use this medicine? This medicine is for injection under the skin. It is given by a health care professional in a hospital or clinic setting. Contact your pediatrician or health care professional regarding the use of this medicine in children. Special care may be needed. Overdosage: If you think you have taken too much of this medicine contact a poison control center or emergency room at once. NOTE: This medicine is only for you. Do not share this medicine with others. What if I miss a dose? It is important not to miss your dose. Call your doctor or health care professional if you are unable to keep an appointment. What may interact with this medicine? This medicine may interact with the following medications:  bromocriptine  cyclosporine  certain medicines for blood pressure, heart disease, irregular heart beat  certain medicines for diabetes  quinidine  terfenadine This list may not describe all possible interactions. Give your health care provider a list of all the medicines, herbs, non-prescription drugs, or dietary supplements you use. Also tell them if you smoke,  drink alcohol, or use illegal drugs. Some items may interact with your medicine. What should I watch for while using this medicine? Tell your doctor or healthcare professional if your symptoms do not start to get better or if they get worse. Visit your doctor or health care professional for regular checks on your progress. Your condition will be monitored carefully while you are receiving this medicine. This medicine may increase blood sugar. Ask your healthcare provider if changes in diet or medicines are needed if you have diabetes. You may need blood work done while you are taking this medicine. Women should inform their doctor if they wish to become pregnant or think they might be pregnant. There is a potential for serious side effects to an unborn child. Talk to your health care professional or pharmacist for more information. Do not breast-feed an infant while taking this medicine or for 6 months after stopping it. This medicine has caused ovarian failure in some women. This medicine may interfere with the ability to have a child. Talk with your doctor or health care professional if you are concerned about your fertility. What side effects may I notice from receiving this medicine? Side effects that you should report to your doctor or health care professional as soon as possible:  allergic reactions like skin rash, itching or hives, swelling of the face, lips, or tongue  increased blood pressure  severe stomach pain  signs and symptoms of hgh blood sugar such as being more thirsty or hungry or having to urinate more than normal. You may also feel very tired or have blurry vision.  signs and symptoms of low blood   sugar such as feeling anxious; confusion; dizziness; increased hunger; unusually weak or tired; sweating; shakiness; cold; irritable; headache; blurred vision; fast heartbeat; loss of consciousness  unusually slow heartbeat Side effects that usually do not require medical  attention (report to your doctor or health care professional if they continue or are bothersome):  constipation  diarrhea  dizziness  headache  muscle pain  muscle spasms  nausea  pain, redness, or irritation at site where injected This list may not describe all possible side effects. Call your doctor for medical advice about side effects. You may report side effects to FDA at 1-800-FDA-1088. Where should I keep my medicine? This drug is given in a hospital or clinic and will not be stored at home. NOTE: This sheet is a summary. It may not cover all possible information. If you have questions about this medicine, talk to your doctor, pharmacist, or health care provider.  2021 Elsevier/Gold Standard (2018-04-18 09:13:08)  

## 2020-11-09 LAB — CHROMOGRANIN A: Chromogranin A (ng/mL): 63.6 ng/mL (ref 0.0–101.8)

## 2020-11-11 ENCOUNTER — Ambulatory Visit (INDEPENDENT_AMBULATORY_CARE_PROVIDER_SITE_OTHER): Payer: 59 | Admitting: Pharmacist

## 2020-11-11 ENCOUNTER — Other Ambulatory Visit: Payer: Self-pay

## 2020-11-11 VITALS — BP 138/84 | HR 73 | Resp 17 | Ht 69.0 in | Wt 268.4 lb

## 2020-11-11 DIAGNOSIS — I10 Essential (primary) hypertension: Secondary | ICD-10-CM

## 2020-11-11 MED ORDER — CARVEDILOL 25 MG PO TABS: 25.0000 mg | ORAL_TABLET | Freq: Two times a day (BID) | ORAL | 1 refills | Status: DC

## 2020-11-11 NOTE — Progress Notes (Signed)
Patient ID: Dwayne Sawyer                 DOB: Jun 18, 1971                      MRN: [id number]     HPI: Dwayne Sawyer is a 50 y.o. adult referred by PCP to Adv HTN clinic with Dr. Oval Linsey. PMH includes uncontrolled hypertension, hx of carcinoid tumor, bowel obstruction. hyperlipidemia, and RA. Patient was diagnosed with HTN > 15 years ago, but control worsen after treatment of carcinoid tumor with lanreotide followed by Lutathera. Pending TSH and sleep study results as well. During last office visit with Dr Oval Linsey on March/21/22, his BP was at 156/84 and carvedilol 12.5mg  was started. Noted BMET on 10/06/2020 shows stable renal function and electrolytes.   Patient presents today for follow up. Denies problems with current therapy. 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   Current HTN meds:  Amlodipine 10mg  daily Carvedilol 12.5mg  twice daily Valsartan/HCT 160/12.5mg  daily  BP goal: < 130/80  Family History: Cancer in Dwayne Sawyer's father; Hyperlipidemia in Dwayne Sawyer's mother; Hypertension in Dwayne Sawyer's mother.  Social History: chew tobacco (patinet reports stopping), occasional alcohol use  Diet: tries mostly home cooked meals and limits salt as much as possible  (dietary indiscretions BBQ on Sunday and pizza on Tuesday)  Exercise: walks and rides bike 45-60 minutes 5x/week  Home BP readings: no records provided 140s mainly - per patient's report  Wt Readings from Last 3 Encounters:  11/11/20 268 lb 6.4 oz (121.7 kg)  09/06/20 268 lb 3.2 oz (121.7 kg)  08/05/20 264 lb (119.7 kg)   BP Readings from Last 3 Encounters:  11/11/20 138/84  11/04/20 (!) 153/96  10/11/20 (!) 156/84   Pulse Readings from Last 3 Encounters:  11/11/20 73  11/04/20 81  10/11/20 69    Past Medical History:  Diagnosis Date  . Arthritis   . Cancer (Interior)   . Family history of carcinoid tumor   . High cholesterol   . Hypertension     Current Outpatient Medications  on File Prior to Visit  Medication Sig Dispense Refill  . amLODipine (NORVASC) 10 MG tablet Take 1 tablet (10 mg total) by mouth daily. 90 tablet 1  . atorvastatin (LIPITOR) 10 MG tablet TAKE ONE TABLET BY MOUTH DAILY AT 6:00 IN THE EVENING 90 tablet 1  . metFORMIN (GLUCOPHAGE) 500 MG tablet Take 1 tablet (500 mg total) by mouth daily with breakfast. 90 tablet 1  . ondansetron (ZOFRAN) 8 MG tablet Take 1 tablet (8 mg total) by mouth 2 (two) times daily as needed for nausea or vomiting. 20 tablet 0  . valsartan-hydrochlorothiazide (DIOVAN-HCT) 160-25 MG tablet Take 1 tablet by mouth daily. 90 tablet 1   No current facility-administered medications on file prior to visit.    No Known Allergies  Blood pressure 138/84, pulse 73, resp. rate 17, height 5\' 9"  (1.753 m), weight 268 lb 6.4 oz (121.7 kg), SpO2 98 %.  Resistant hypertension Blood pressure remains above goal but improved from previous visits. Patient denies problems with carvedilol therapy and reports compliance.  Will increase carvedilol to 25mg  twice daily and follow up in 4 weeks for additional medication titration if needed.   Nam Vossler Rodriguez-Guzman PharmD, BCPS, [place] [id number] 11/16/2020 11:02 AM

## 2020-11-11 NOTE — Patient Instructions (Addendum)
Return for a  follow up appointment in 4 week after appoinment with PA on 12/14/2020  Check your blood pressure at home daily (if able) and keep record of the readings.  Take your BP meds as follows: *INCREASE carvedilol dose to 25mg  twice daily* *CONTINUE all other medication as previously prescribed  Bring all of your meds, your BP cuff and your record of home blood pressures to your next appointment.  Exercise as you're able, try to walk approximately 30 minutes per day.  Keep salt intake to a minimum, especially watch canned and prepared boxed foods.  Eat more fresh fruits and vegetables and fewer canned items.  Avoid eating in fast food restaurants.    HOW TO TAKE YOUR BLOOD PRESSURE: . Rest 5 minutes before taking your blood pressure. .  Don't smoke or drink caffeinated beverages for at least 30 minutes before. . Take your blood pressure before (not after) you eat. . Sit comfortably with your back supported and both feet on the floor (don't cross your legs). . Elevate your arm to heart level on a table or a desk. . Use the proper sized cuff. It should fit smoothly and snugly around your bare upper arm. There should be enough room to slip a fingertip under the cuff. The bottom edge of the cuff should be 1 inch above the crease of the elbow. . Ideally, take 3 measurements at one sitting and record the average.

## 2020-11-16 ENCOUNTER — Encounter: Payer: Self-pay | Admitting: Pharmacist

## 2020-11-16 NOTE — Assessment & Plan Note (Signed)
Blood pressure remains above goal but improved from previous visits. Patient denies problems with carvedilol therapy and reports compliance.  Will increase carvedilol to 25mg  twice daily and follow up in 4 weeks for additional medication titration if needed.

## 2020-11-23 ENCOUNTER — Ambulatory Visit (HOSPITAL_BASED_OUTPATIENT_CLINIC_OR_DEPARTMENT_OTHER): Payer: 59 | Attending: Cardiovascular Disease | Admitting: Cardiovascular Disease

## 2020-11-23 ENCOUNTER — Other Ambulatory Visit: Payer: Self-pay

## 2020-11-23 DIAGNOSIS — R0683 Snoring: Secondary | ICD-10-CM | POA: Diagnosis present

## 2020-11-23 DIAGNOSIS — R400 Somnolence: Secondary | ICD-10-CM | POA: Diagnosis present

## 2020-11-23 DIAGNOSIS — G4733 Obstructive sleep apnea (adult) (pediatric): Secondary | ICD-10-CM | POA: Diagnosis not present

## 2020-11-23 DIAGNOSIS — R0902 Hypoxemia: Secondary | ICD-10-CM | POA: Insufficient documentation

## 2020-11-23 DIAGNOSIS — I10 Essential (primary) hypertension: Secondary | ICD-10-CM | POA: Diagnosis not present

## 2020-11-23 DIAGNOSIS — G4736 Sleep related hypoventilation in conditions classified elsewhere: Secondary | ICD-10-CM | POA: Diagnosis not present

## 2020-11-23 DIAGNOSIS — G4734 Idiopathic sleep related nonobstructive alveolar hypoventilation: Secondary | ICD-10-CM | POA: Diagnosis not present

## 2020-11-24 NOTE — Procedures (Signed)
   Patient Name: Dwayne Sawyer, Dwayne Sawyer Date: 11/23/2020 Gender: Male D.O.B: 1970-12-26 Age (years): 50 Referring Provider: Skeet Latch Height (inches): 35 Interpreting Physician: Fransico Him MD, ABSM Weight (lbs): 260 RPSGT: Jacolyn Reedy BMI: 38 MRN: [id number] Neck Size: 18.00  CLINICAL INFORMATION Sleep Study Type: HST  Indication for sleep study: N/A  Epworth Sleepiness Score: 3  SLEEP STUDY TECHNIQUE A multi-channel overnight portable sleep study was performed. The channels recorded were: nasal airflow, thoracic respiratory movement, and oxygen saturation with a pulse oximetry. Snoring was also monitored.  MEDICATIONS Patient self administered medications include: N/A.  SLEEP ARCHITECTURE Patient was studied for 418.1 minutes. The sleep efficiency was 100.0 % and the patient was supine for 46.3%. The arousal index was 0.0 per hour.  RESPIRATORY PARAMETERS The overall AHI was 44.8 per hour, with a central apnea index of 0 per hour.  The oxygen nadir was 70% during sleep.  CARDIAC DATA Mean heart rate during sleep was 64.5 bpm.  IMPRESSIONS - Severe obstructive sleep apnea occurred during this study (AHI = 44.8/h). - Severe oxygen desaturation was noted during this study (Min O2 = 70%). - Patient snored 17.8% during the sleep.  DIAGNOSIS - Obstructive Sleep Apnea (G47.33) - Nocturnal Hypoxemia (G47.36)  RECOMMENDATIONS - Recommend in lab CPAP titration due to severity of OSA and nocturnal hypoxemia.  - Positional therapy avoiding supine position during sleep. - Avoid alcohol, sedatives and other CNS depressants that may worsen sleep apnea and disrupt normal sleep architecture. - Sleep hygiene should be reviewed to assess factors that may improve sleep quality. - Weight management and regular exercise should be initiated or continued. - Patient may benefit from in-lab study  [Electronically signed] 11/24/2020 09:48 PM  Fransico Him MD,  ABSM Diplomate, American Board of Sleep Medicine

## 2020-11-26 ENCOUNTER — Telehealth: Payer: Self-pay | Admitting: Hematology

## 2020-11-26 ENCOUNTER — Telehealth: Payer: Self-pay

## 2020-11-26 NOTE — Telephone Encounter (Signed)
I spoke with Mr Dority letting him know our scheduling department will be reaching out to him to schedule sandostatin injection.  He verbalized understand.

## 2020-11-26 NOTE — Telephone Encounter (Signed)
Scheduled appt per 5/6 sch msg. Called pt, no answer. Left msg with appt date and time.  

## 2020-11-27 ENCOUNTER — Telehealth: Payer: Self-pay | Admitting: *Deleted

## 2020-11-27 DIAGNOSIS — R400 Somnolence: Secondary | ICD-10-CM

## 2020-11-27 NOTE — Telephone Encounter (Signed)
-----   Message from Sueanne Margarita, MD sent at 11/24/2020  9:51 PM EDT ----- Please let patient know that they have sleep apnea and recommend in lab CPAP titration

## 2020-11-27 NOTE — Telephone Encounter (Addendum)
The patient has been notified of the result and verbalized understanding.  All questions (if any) were answered. Patient understands his sleep study showed they have sleep apnea and recommend CPAP titration. Please set up titration in the sleep lab ASAP. Left detailed message on voicemail and informed patient to call back with questions  Titration ordered

## 2020-11-29 ENCOUNTER — Encounter: Payer: Self-pay | Admitting: Family Medicine

## 2020-11-29 DIAGNOSIS — G4733 Obstructive sleep apnea (adult) (pediatric): Secondary | ICD-10-CM | POA: Insufficient documentation

## 2020-12-01 ENCOUNTER — Telehealth: Payer: Self-pay | Admitting: *Deleted

## 2020-12-01 ENCOUNTER — Ambulatory Visit (HOSPITAL_COMMUNITY): Admission: RE | Admit: 2020-12-01 | Payer: MEDICAID | Source: Ambulatory Visit

## 2020-12-01 NOTE — Telephone Encounter (Signed)
-----   Message from Freada Bergeron, [place] sent at 11/29/2020 10:29 AM EDT ----- Regarding: precert  recommend in lab CPAP titration

## 2020-12-01 NOTE — Telephone Encounter (Signed)
PA request for CPAP titration faxed to [employer] @ [phone].

## 2020-12-02 ENCOUNTER — Inpatient Hospital Stay: Payer: 59 | Attending: Nurse Practitioner

## 2020-12-02 ENCOUNTER — Other Ambulatory Visit: Payer: Self-pay

## 2020-12-02 VITALS — BP 150/110 | HR 76 | Temp 99.3°F | Resp 20

## 2020-12-02 DIAGNOSIS — C7B02 Secondary carcinoid tumors of liver: Secondary | ICD-10-CM | POA: Insufficient documentation

## 2020-12-02 DIAGNOSIS — Z79899 Other long term (current) drug therapy: Secondary | ICD-10-CM | POA: Insufficient documentation

## 2020-12-02 DIAGNOSIS — C7A011 Malignant carcinoid tumor of the jejunum: Secondary | ICD-10-CM | POA: Diagnosis not present

## 2020-12-02 DIAGNOSIS — R197 Diarrhea, unspecified: Secondary | ICD-10-CM | POA: Insufficient documentation

## 2020-12-02 DIAGNOSIS — D3A011 Benign carcinoid tumor of the jejunum: Secondary | ICD-10-CM

## 2020-12-02 MED ORDER — LANREOTIDE ACETATE 120 MG/0.5ML ~~LOC~~ SOLN
120.0000 mg | Freq: Once | SUBCUTANEOUS | Status: AC
  Administered 2020-12-02: 120 mg via SUBCUTANEOUS
  Filled 2020-12-02: qty 120

## 2020-12-02 NOTE — Progress Notes (Signed)
Md aware of pt's BP and instructed this nurse it was okay to give injection today. Also relayed MD's message to PT stating to recheck bp at home and if still high, see cardiologist asap. Pt understood.

## 2020-12-03 ENCOUNTER — Other Ambulatory Visit: Payer: Self-pay

## 2020-12-03 DIAGNOSIS — D3A098 Benign carcinoid tumors of other sites: Secondary | ICD-10-CM

## 2020-12-03 DIAGNOSIS — C7A011 Malignant carcinoid tumor of the jejunum: Secondary | ICD-10-CM | POA: Diagnosis not present

## 2020-12-03 NOTE — Telephone Encounter (Signed)
PA approval received from [employer] for CPAP titration. Auth # [id number]. Valid dates 12/01/20 to 03/03/21.

## 2020-12-04 NOTE — Progress Notes (Signed)
Cardiology Office Note:    Date:  12/14/2020   ID:  Dwayne Sawyer, DOB 1971-03-09, MRN [id number]  PCP:  [name], MD  Cardiologist:  Skeet Latch, MD  Electrophysiologist:  None   Referring MD: [name], MD   Chief Complaint: follow-up of hypertension  History of Present Illness:    Dwayne Sawyer is a 50 y.o. 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 Oval Linsey and presents today for follow-up of hypertension.   Patient was referred to Dr. [facility] in 09/2020 . Patient was reportedly first diagnosed 15 to 20 years ago after being being diagnosed with carcinoid tumor. He was later found to be metastasis to the liver and was being treated with Lutathera and Lanreotide. He was referred to Dr. Oval Linsey due to resistant hypertension despite being on more than 3 medications (Amlodipine 10mg  daily, Valsartan-HCTZ 160-25mg  daily, and Coreg 3.125mg  daily). It was felt that resistant hypertension was due to medications for metastatic carcinoid tumor. However, patient was evaluated with other secondary causes of hypertension. TSH and renal artery ultrasound were unremarkable. However, sleep study was positive. Patient was seen on 11/11/2020 by PharmD for follow-up. BP was better but still above goal so Coreg was increased.   Patient presents today for follow-up. Here alone. Patient reports doing well since last visit. His BP still fluctuating a lot and ranges from 120s/80s to 150s/100s. Current medications include: Amlodipine 10mg  daily, Coreg 25mg  twice daily, and Valsartan-HCTZ 160-25mg  daily. He states he stopped his Estrogen/Progesterone for a while to see if that helped his BP and noticed no significant improvement so has restarted this. He denies any cardiac symptoms - no chest pain, shortness of breath, orthopnea,  PND, significant edema, lightheadedness, dizziness, or syncope. He is frustrated about his treatment for his carcinoid tumor. Apparent, production of Lutathera has been halted due to manufacturing issues. No other complaints at this time. Home sleep study earlier this year was positive. He is schedule for in-lab sleep study at the end of July.  Of note, he has right knee pain and states he has been referred to surgeon for possible "knee scope." He is able to complete >4.0 METS without any problems.   Past Medical History:  Diagnosis Date  . Arthritis   . Cancer ([place])   . Family history of carcinoid tumor   . High cholesterol   . Hypertension     Past Surgical History:  Procedure Laterality Date  . HERNIA REPAIR    . LAPAROTOMY N/A 06/12/2019   Procedure: EXPLORATORY LAPAROTOMY WITH SMALL BOWEL RESECTION;  Surgeon: Johnathan Hausen, MD;  Location: [facility];  Service: General;  Laterality: N/A;  . SHOULDER SURGERY    . SMALL INTESTINE SURGERY      Current Medications: Current Meds  Medication Sig  . amLODipine (NORVASC) 10 MG tablet Take 1 tablet (10 mg total) by mouth daily.  Marland Kitchen atorvastatin (LIPITOR) 10 MG tablet TAKE ONE TABLET BY MOUTH DAILY AT 6:00 IN THE EVENING  . carvedilol (COREG) 25 MG tablet Take 1 tablet (25 mg total) by mouth 2 (two) times daily with a meal.  . estrogens, conjugated, (PREMARIN) 0.3 MG tablet Take 0.3 mg by mouth daily. Take 4 mg in the morning an 2 in evening.  . metFORMIN (GLUCOPHAGE) 500 MG tablet Take 1 tablet (500 mg total) by mouth daily with breakfast.  . ondansetron (  ZOFRAN) 8 MG tablet Take 1 tablet (8 mg total) by mouth 2 (two) times daily as needed for nausea or vomiting.  . progesterone 200 MG SUPP Place 200 mg vaginally at bedtime.  . valsartan-hydrochlorothiazide (DIOVAN HCT) 320-25 MG tablet Take 1 tablet by mouth daily.  . [DISCONTINUED] valsartan-hydrochlorothiazide (DIOVAN-HCT) 160-25 MG tablet Take 1 tablet by mouth daily.     Allergies:    Patient has no known allergies.   Social History   Socioeconomic History  . Marital status: Married    Spouse name: Not on file  . Number of children: Not on file  . Years of education: Not on file  . Highest education level: Not on file  Occupational History  . Not on file  Tobacco Use  . Smoking status: Never Smoker  . Smokeless tobacco: Former Systems developer    Types: Secondary school teacher  . Vaping Use: Never used  Substance and Sexual Activity  . Alcohol use: Yes    Comment: occ  . Drug use: Never  . Sexual activity: Not on file  Other Topics Concern  . Not on file  Social History Narrative  . Not on file   Social Determinants of Health   Financial Resource Strain: Low Risk   . Difficulty of Paying Living Expenses: Not very hard  Food Insecurity: No Food Insecurity  . Worried About Charity fundraiser in the Last Year: Never true  . Ran Out of Food in the Last Year: Never true  Transportation Needs: No Transportation Needs  . Lack of Transportation (Medical): No  . Lack of Transportation (Non-Medical): No  Physical Activity: Sufficiently Active  . Days of Exercise per Week: 5 days  . Minutes of Exercise per Session: 60 min  Stress: No Stress Concern Present  . Feeling of Stress : Only a little  Social Connections: Not on file     Family History: The patient's family history includes Cancer in [place]'s father; Hyperlipidemia in [place] Piccini's mother; Hypertension in [place] Dykema's mother.  ROS:   Please see the history of present illness.     EKGs/Labs/Other Studies Reviewed:    The following studies were reviewed today:  Renal Artery Ultrasound 10/21/2020: Summary:  Largest Aortic Diameter: 2.2 cm   Renal:  - Right: Normal size right kidney. Normal right Resisitive Index. Normal cortical thickness of right kidney. No evidence of right renal artery stenosis. RRV flow present.  - Left: Normal size of left kidney. Normal left Resistive Index. Normal cortical  thickness of the left kidney. No evidence of left renal artery stenosis. LRV flow present.  - Mesenteric: Normal Celiac artery and Superior Mesenteric artery findings.    Technically difficult study due to abdominal girth, bowel gas and flash artifact.   EKG:  EKG not ordered today.   Recent Labs: 10/11/2020: TSH 1.960 11/04/2020: ALT 17; BUN 18; Creatinine 1.00; Hemoglobin 13.3; Platelet Count 243; Potassium 4.4; Sodium 139  Recent Lipid Panel    Component Value Date/Time   CHOL 172 09/06/2020 1046   TRIG 244.0 (H) 09/06/2020 1046   HDL 49.80 09/06/2020 1046   CHOLHDL 3 09/06/2020 1046   VLDL 48.8 (H) 09/06/2020 1046   LDLDIRECT 88.0 09/06/2020 1046    Physical Exam:    Vital Signs: BP (!) 142/86   Pulse 78   Ht 5\' 9"  (1.753 m)   Wt 263 lb (119.3 kg)   SpO2 97%   BMI 38.84 kg/m     Wt Readings from Last  3 Encounters:  12/14/20 263 lb (119.3 kg)  12/10/20 265 lb 6.4 oz (120.4 kg)  11/23/20 260 lb (117.9 kg)     General: 50 y.o. obese adult in no acute distress. HEENT: Normocephalic and atraumatic. Sclera clear.  Neck: Supple. No carotid bruits. No JVD. Heart: RRR. Distinct S1 and S2. No murmurs, gallops, or rubs. Radial and posterior tibial pulses 2+ and equal bilaterally. Lungs: No increased work of breathing. Clear to ausculation bilaterally. No wheezes, rhonchi, or rales.  Abdomen: Soft, non-distended, and non-tender to palpation.  Extremities: No lower extremity edema.    Skin: Warm and dry. Neuro: Alert and oriented x3. No focal deficits. Psych: Normal affect. Responds appropriately.  Assessment:    1. Resistant hypertension   2. Hyperlipidemia, unspecified hyperlipidemia type   3. Obstructive sleep apnea   4. Type 2 diabetes mellitus with obesity ([place])   5. Pre-op evaluation   6. Medication management     Plan:    Hypertension - Has had work-up for secondary hypertension. TSH and renal artery ultrasound normal; however, was found to have obstructive  sleep apnea on home sleep study. Has not started CPAP yesterday (scheduled to have in-lab study in 01/2021).  Medications for metastatic carcinoid tumor (Lutathera and Lanreotide) also felt to be contributing. Also on Progesrone and Estrogen which can cause hypertension but patient noticed no significant difference when he stopped these. - BP still fluctuating and ranging from 120s/80s to 150s/110s. BP 142/86 in the office today. - Will increase Valsartan-HCTZ 320-25mg  daily.  - Continue Amlodipine 10mg  daily and Coreg 25mg  twice daily. - Continue to monitor BP/HR at home.  - Encouraged increasing physical activity.  - Hoping treatment of sleep apnea will also help his BP.  Hyperlipidemia - Lipid panel in 08/2020: Total Cholesterol 172, Triglycerides 244, HDL 49. Direct LDL 88. - Continue Lipitor 10mg  daily.  - Followed by PCP.  Type 2 Diabetes Mellitus - Hemoglobin A1c 6.7 on 12/10/2020 which is now consistent with type 2 diabetes (previously considered to have pre-diabetes). - On Metformin. - Management per PCP.  Obstructive Sleep Apnea - Home sleep study positive for OSA earlier this year. He is scheduled for in-lab study in 01/2021.  - Hopefully treatment of this will also help his hypertension.  Pre-Op Evaluation - Patient may need knee arthroscopy sometime soon. - Patient is stable from a cardiac standpoint. No acute chest pain, CHF symptoms, syncope. - Per Revised Cardiac Risk Index, considered very low risk with 0.4% chance of adverse cardiac events perioperatively. Able to complete >4.0 METS without any problems. Therefore, based on ACC/AHA guidelines, patient would be at acceptable risk for the planned procedure without further cardiovascular testing.   Disposition: Follow up in 3-4 months.   Medication Adjustments/Labs and Tests Ordered: Current medicines are reviewed at length with the patient today.  Concerns regarding medicines are outlined above.  Orders Placed This  Encounter  Procedures  . Basic metabolic panel   Meds ordered this encounter  Medications  . valsartan-hydrochlorothiazide (DIOVAN HCT) 320-25 MG tablet    Sig: Take 1 tablet by mouth daily.    Dispense:  90 tablet    Refill:  3    Patient Instructions  Medication Instructions:   INCREASE Valsartan-Hydrochlorothiazide 300-25 mg daily  *If you need a refill on your cardiac medications before your next appointment, please call your pharmacy*  Lab Work: Your physician recommends that you return for lab work in 1 week:   BMET  If you have labs (blood  work) drawn today and your tests are completely normal, you will receive your results only by: Marland Kitchen MyChart Message (if you have MyChart) OR . A paper copy in the mail If you have any lab test that is abnormal or we need to change your treatment, we will call you to review the results.  Testing/Procedures: NONE ordered at this time of appointment   Follow-Up: At [facility], you and your health needs are our priority.  As part of our continuing mission to provide you with exceptional heart care, we have created designated Provider Care Teams.  These Care Teams include your primary Cardiologist (physician) and Advanced Practice Providers (APPs -  Physician Assistants and Nurse Practitioners) who all work together to provide you with the care you need, when you need it.  Your next appointment:   3 month(s)  The format for your next appointment:   In Person  Provider:   Skeet Latch, MD   Other Instructions      Signed, Darreld Mclean, PA-C  12/14/2020 10:00 AM    [place]

## 2020-12-10 ENCOUNTER — Other Ambulatory Visit: Payer: Self-pay

## 2020-12-10 ENCOUNTER — Encounter: Payer: Self-pay | Admitting: Family Medicine

## 2020-12-10 ENCOUNTER — Ambulatory Visit (INDEPENDENT_AMBULATORY_CARE_PROVIDER_SITE_OTHER): Payer: 59 | Admitting: Family Medicine

## 2020-12-10 VITALS — BP 144/96 | HR 79 | Temp 97.6°F | Ht 69.0 in | Wt 265.4 lb

## 2020-12-10 DIAGNOSIS — D3A011 Benign carcinoid tumor of the jejunum: Secondary | ICD-10-CM

## 2020-12-10 DIAGNOSIS — I10 Essential (primary) hypertension: Secondary | ICD-10-CM | POA: Diagnosis not present

## 2020-12-10 DIAGNOSIS — G4733 Obstructive sleep apnea (adult) (pediatric): Secondary | ICD-10-CM

## 2020-12-10 DIAGNOSIS — R7303 Prediabetes: Secondary | ICD-10-CM

## 2020-12-10 DIAGNOSIS — M25561 Pain in right knee: Secondary | ICD-10-CM

## 2020-12-10 DIAGNOSIS — Z6837 Body mass index (BMI) 37.0-37.9, adult: Secondary | ICD-10-CM | POA: Insufficient documentation

## 2020-12-10 DIAGNOSIS — E669 Obesity, unspecified: Secondary | ICD-10-CM

## 2020-12-10 LAB — 5 HIAA, QUANTITATIVE, URINE, 24 HOUR
5-HIAA, Ur: 4.7 mg/L
5-HIAA,Quant.,24 Hr Urine: 5.9 mg/24 hr (ref 0.0–14.9)
Total Volume: 1250

## 2020-12-10 LAB — HEMOGLOBIN A1C: Hgb A1c MFr Bld: 6.7 % — ABNORMAL HIGH (ref 4.6–6.5)

## 2020-12-10 NOTE — Progress Notes (Signed)
[facility] [address] [name] [address] Dept: [phone] Dept Fax: [phone]  Chronic Care Office Visit  Subjective:    Patient ID: Dwayne Sawyer, adult    DOB: Aug 22, 1970, 50 y.o..   MRN: [id number]  Chief Complaint  Patient presents with  . Follow-up    3 month f/u HTN.       History of Present Illness:  Patient is in today for reassessment of chronic medical issues.  Mr. Saladin notes some frustration related to his carcinoid tumor management. He had been receiving Lutathera (Lutetium Lu-177 dototate) infusions through nuclear medicine. His last infusion was in March. However, since then production of this agent has been halted apparently due to manufacturing issues. He was to be due for treatment this month and is not due to see his oncologist until July.  Mr. Sieling was seen by cardiology related to his resistant hypertension. They increased his carvedilol to 25 mg bid. He feels his pressures might even be higher since that change. He did come off of his estrogen and progesterone for about 6 weeks, but did not see appreciable improvement in his BP, so resumed his hormones about 1 week ago. Mr. Tavano has a history of prediabetes. We started metformin after his last visit. We are due to reassess this.  Mr. Hoban notes some right knee pain over the past few weeks. He wants to engage in exercise to lose weight, knowing this will help with his hypertension and sleep apnea. He does not recall a specific injury to the knee.  Mr. Holmer was recommended to have a in-person sleep lab appointment for titration of his CPAP. He was found to have severe sleep apnea on a home sleep study. He is still awaiting scheduling of the CPAP titration appointment.  Past Medical History: Patient Active Problem List   Diagnosis Date Noted  . Obesity (BMI 35.0-39.9 without comorbidity) 12/10/2020  . Obstructive sleep apnea syndrome,  severe 11/29/2020  . Secondary carcinoid tumors of liver ([place]) 09/06/2020  . Hyperlipidemia 09/06/2020  . Resistant hypertension 09/06/2020  . Pre-diabetes 09/06/2020  . Family history of carcinoid tumor   . 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 Bowel obstruction ([place]) 06/11/2019  . Rheumatoid arthritis involving multiple sites with positive rheumatoid factor ([place]) 10/13/2015   Past Surgical History:  Procedure Laterality Date  . HERNIA REPAIR    . LAPAROTOMY N/A 06/12/2019   Procedure: EXPLORATORY LAPAROTOMY WITH SMALL BOWEL RESECTION;  Surgeon: Johnathan Hausen, MD;  Location: [facility];  Service: General;  Laterality: N/A;  . SHOULDER SURGERY    . SMALL INTESTINE SURGERY     Family History  Problem Relation Age of Onset  . Cancer Father        widespread carcinoid cancer  . Hypertension Mother   . Hyperlipidemia Mother    Outpatient Medications Prior to Visit  Medication Sig Dispense Refill  . amLODipine (NORVASC) 10 MG tablet Take 1 tablet (10 mg total) by mouth daily. 90 tablet 1  . atorvastatin (LIPITOR) 10 MG tablet TAKE ONE TABLET BY MOUTH DAILY AT 6:00 IN THE EVENING 90 tablet 1  . carvedilol (COREG) 25 MG tablet Take 1 tablet (25 mg total) by mouth 2 (two) times daily with a meal. 60 tablet 1  . estrogens, conjugated, (PREMARIN) 0.3 MG tablet Take 0.3 mg by mouth daily. Take 4 mg in the morning an 2 in evening.    . metFORMIN (GLUCOPHAGE) 500 MG tablet Take 1  tablet (500 mg total) by mouth daily with breakfast. 90 tablet 1  . ondansetron (ZOFRAN) 8 MG tablet Take 1 tablet (8 mg total) by mouth 2 (two) times daily as needed for nausea or vomiting. 20 tablet 0  . progesterone 200 MG SUPP Place 200 mg vaginally at bedtime.    . valsartan-hydrochlorothiazide (DIOVAN-HCT) 160-25 MG tablet Take 1 tablet by mouth daily. 90 tablet 1   No facility-administered medications prior to visit.   No Known Allergies    Objective:   Today's Vitals   12/10/20 0935  BP: (!)  144/96  Pulse: 79  Temp: 97.6 F (36.4 C)  TempSrc: Temporal  SpO2: 97%  Weight: 265 lb 6.4 oz (120.4 kg)  Height: 5\' 9"  (1.753 m)   Body mass index is 39.19 kg/m.   General: Well developed, well nourished. No acute distress. Extremities: Full ROM. Pain along the right knee joint line, medial to the patellar tendon. No obvious swelling noted.   Varus/valgus and Lachman's testing are normal. There is a + McMurry's test laterally with some general crepitance noted. Psych: Alert and oriented. Normal mood and affect.  Health Maintenance Due  Topic Date Due  . Hepatitis C Screening  Never done  . PAP SMEAR-Modifier  Never done  . COLONOSCOPY (Pts 45-35yrs Insurance coverage will need to be confirmed)  Never done  . MAMMOGRAM  Never done  . COVID-19 Vaccine (2 - Janssen risk 3-dose series) 06/04/2020     Assessment & Plan:   1. Resistant hypertension Reviewed cardiology consult note. Blood pressure is still a little high today, but much improved from his last visit. We will continue his 4-drug therapy.  2. Obstructive sleep apnea syndrome, severe Reviewed sleep study results. Awaiting CPAP titration.  3. Carcinoid tumor of jejunum, unspecified whether malignant Following with oncology. Treatment decision needed by oncologist in light of Lutathera not being available.  4. Pre-diabetes On metformin. We will reassess HbA1c today.  - Hemoglobin A1c  5. Obesity (BMI 35.0-39.9 without comorbidity) Weight down 3 lbs. We did discuss [employer] as a potential option for medically guided exercise.  6. Right anterior knee pain Exam is consistent with possible lateral meniscal tear. I will refer to sports medicine for assessment and to consider possible conservative approaches vs. arthroscopy.  - Ambulatory referral to Sports Medicine  [name], MD

## 2020-12-10 NOTE — Telephone Encounter (Signed)
Patient is scheduled for CPAP Titration on 02-14-21. Patient understands his titration study will be done at [facility] sleep lab. Patient understands he will receive a letter in a week or so detailing appointment, date, time, and location. Patient understands to call if he does not receive the letter  in a timely manner. Patient agrees with treatment and thanked me for call.

## 2020-12-10 NOTE — Addendum Note (Signed)
Addended by: Freada Bergeron on: 12/10/2020 03:08 PM   Modules accepted: Orders

## 2020-12-13 NOTE — Progress Notes (Signed)
I, [name], LAT, ATC acting as a scribe for [name], MD.  Subjective:    I'm seeing this patient as a consultation for: Dr. Arlester Marker. Note will be routed back to referring provider/PCP.  CC: Right knee pain  HPI: Pt is a 50 y/o nonbinary person c/o R knee pain ongoing for a few months, w/ worsening pain over the last few weeks w/ no know MOI. Pt suspects he may have "tweaked" knee while golfing, but doesn't recall a specific incident. Pt locates his pain to the anterior aspect of R knee. Pt c/o of knee getting stiff after prolonged sitting. Pt notes pain is waking him up at night.  R knee swelling: yes- esp w/ increased activity Mechanical symptoms: yes Aggravates: prolonged sitting, walking, sleeping Treatments tried: knee brace  Past medical history, Surgical history, Family history, Social history, Allergies, and medications have been entered into the medical record, reviewed.   Review of Systems: No new headache, visual changes, nausea, vomiting, diarrhea, constipation, dizziness, abdominal pain, skin rash, fevers, chills, night sweats, weight loss, swollen lymph nodes, body aches, joint swelling, muscle aches, chest pain, shortness of breath, mood changes, visual or auditory hallucinations.   Objective:    Vitals:   12/14/20 1234  BP: (!) 142/86  Pulse: 78  SpO2: 97%   General: Well Developed, well nourished, and in no acute distress.  Neuro/Psych: Alert and oriented x3, extra-ocular muscles intact, able to move all 4 extremities, sensation grossly intact. Skin: Warm and dry, no rashes noted.  Respiratory: Not using accessory muscles, speaking in full sentences, trachea midline.  Cardiovascular: Pulses palpable, no extremity edema. Abdomen: Does not appear distended. MSK:  Right knee: Normal-appearing Normal motion with crepitation. Tender palpation medial and lateral joint line. Stable ligamentous exam. Positive medial McMurray's test. Intact  strength.   Lab and Radiology Results  X-ray images right knee obtained today personally and independently interpreted. Mild medial compartment DJD.  Minimal patellofemoral DJD. Await formal radiology review.  Procedure: Real-time Ultrasound Guided Injection of right knee superior lateral patellar space Device: Philips Affiniti 50G Images permanently stored and available for review in PACS Ultrasound evaluation prior to injection reveals degenerative appearing medial and lateral meniscus with partial extruded medial meniscus with visible tear. Verbal informed consent obtained.  Discussed risks and benefits of procedure. Warned about infection bleeding damage to structures skin hypopigmentation and fat atrophy among others. Patient expresses understanding and agreement Time-out conducted.   Noted no overlying erythema, induration, or other signs of local infection.   Skin prepped in a sterile fashion.   Local anesthesia: Topical Ethyl chloride.   With sterile technique and under real time ultrasound guidance:  40 mg of Kenalog and 2 mL of Marcaine injected into knee joint. Fluid seen entering the joint capsule.   Completed without difficulty   Pain immediately resolved suggesting accurate placement of the medication.   Advised to call if fevers/chills, erythema, induration, drainage, or persistent bleeding.   Images permanently stored and available for review in the ultrasound unit.  Impression: Technically successful ultrasound guided injection.       Impression and Recommendations:    Assessment and Plan: 50 y.o. adult with right knee pain with some mild mechanical symptoms.  Symptoms are bothersome ongoing now for months.  He has tried his own home exercise program which has not helped very much.  Given his cancer history we will go ahead and proceed with imaging including x-ray and MRI today and proceed with treatment with  Voltaren gel, and injection.  Recheck after  MRI.  Marland Kitchen  PDMP not reviewed this encounter. Orders Placed This Encounter  Procedures  . [place] LIMITED JOINT SPACE STRUCTURES LOW RIGHT(NO LINKED CHARGES)    Order Specific Question:   Reason for Exam (SYMPTOM  OR DIAGNOSIS REQUIRED)    Answer:   R knee pain    Order Specific Question:   Preferred imaging location?    Answer:   [facility]  . DG Knee AP/LAT W/Sunrise Right    Standing Status:   Future    Standing Expiration Date:   12/14/2021    Order Specific Question:   Reason for Exam (SYMPTOM  OR DIAGNOSIS REQUIRED)    Answer:   right knee pain    Order Specific Question:   Preferred imaging location?    Answer:   [facility]    Order Specific Question:   Is patient pregnant?    Answer:   No  . MR Knee Right Wo Contrast    Standing Status:   Future    Standing Expiration Date:   12/14/2021    Order Specific Question:   What is the patient's sedation requirement?    Answer:   No Sedation    Order Specific Question:   Does the patient have a pacemaker or implanted devices?    Answer:   No    Order Specific Question:   Preferred imaging location?    Answer:   Product/process development scientist (table limit-350lbs)   No orders of the defined types were placed in this encounter.   Discussed warning signs or symptoms. Please see discharge instructions. Patient expresses understanding.   The above documentation has been reviewed and is accurate and complete [name], M.D.

## 2020-12-14 ENCOUNTER — Encounter: Payer: Self-pay | Admitting: Family Medicine

## 2020-12-14 ENCOUNTER — Ambulatory Visit (INDEPENDENT_AMBULATORY_CARE_PROVIDER_SITE_OTHER): Payer: 59

## 2020-12-14 ENCOUNTER — Other Ambulatory Visit: Payer: Self-pay

## 2020-12-14 ENCOUNTER — Ambulatory Visit (INDEPENDENT_AMBULATORY_CARE_PROVIDER_SITE_OTHER): Payer: 59 | Admitting: Family Medicine

## 2020-12-14 ENCOUNTER — Encounter: Payer: Self-pay | Admitting: Student

## 2020-12-14 ENCOUNTER — Ambulatory Visit: Payer: Self-pay

## 2020-12-14 ENCOUNTER — Ambulatory Visit (INDEPENDENT_AMBULATORY_CARE_PROVIDER_SITE_OTHER): Payer: 59 | Admitting: Student

## 2020-12-14 VITALS — BP 142/86 | HR 78 | Ht 69.0 in | Wt 263.0 lb

## 2020-12-14 VITALS — BP 142/86 | HR 78 | Ht 69.0 in | Wt 265.4 lb

## 2020-12-14 DIAGNOSIS — E669 Obesity, unspecified: Secondary | ICD-10-CM

## 2020-12-14 DIAGNOSIS — I10 Essential (primary) hypertension: Secondary | ICD-10-CM

## 2020-12-14 DIAGNOSIS — Z01818 Encounter for other preprocedural examination: Secondary | ICD-10-CM

## 2020-12-14 DIAGNOSIS — E785 Hyperlipidemia, unspecified: Secondary | ICD-10-CM

## 2020-12-14 DIAGNOSIS — G4733 Obstructive sleep apnea (adult) (pediatric): Secondary | ICD-10-CM | POA: Diagnosis not present

## 2020-12-14 DIAGNOSIS — M25561 Pain in right knee: Secondary | ICD-10-CM | POA: Diagnosis not present

## 2020-12-14 DIAGNOSIS — Z01810 Encounter for preprocedural cardiovascular examination: Secondary | ICD-10-CM

## 2020-12-14 DIAGNOSIS — E1169 Type 2 diabetes mellitus with other specified complication: Secondary | ICD-10-CM

## 2020-12-14 DIAGNOSIS — Z79899 Other long term (current) drug therapy: Secondary | ICD-10-CM

## 2020-12-14 IMAGING — DX DG KNEE AP/LAT W/ SUNRISE*R*
3 series · 3 of 3 positions shown · non-contrast
Comparison: None.

CLINICAL DATA: Right knee pain.  Pain for 3 months.

EXAM:
RIGHT KNEE 3 VIEWS

[knee ap]
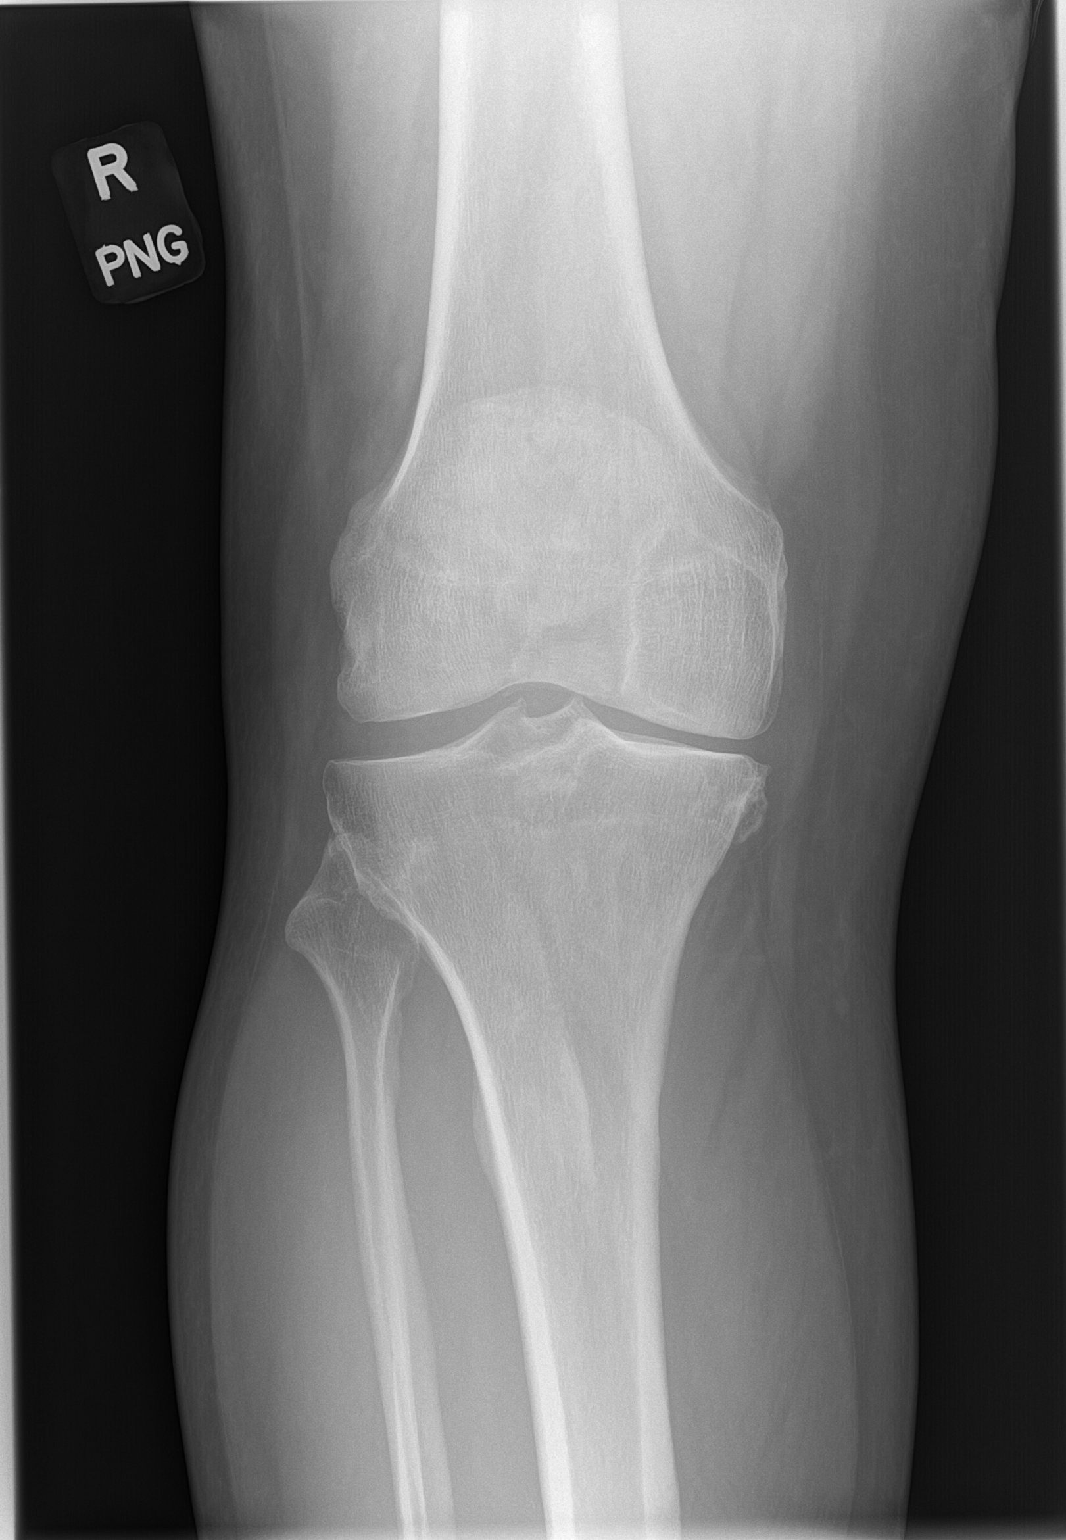

[knee lat]
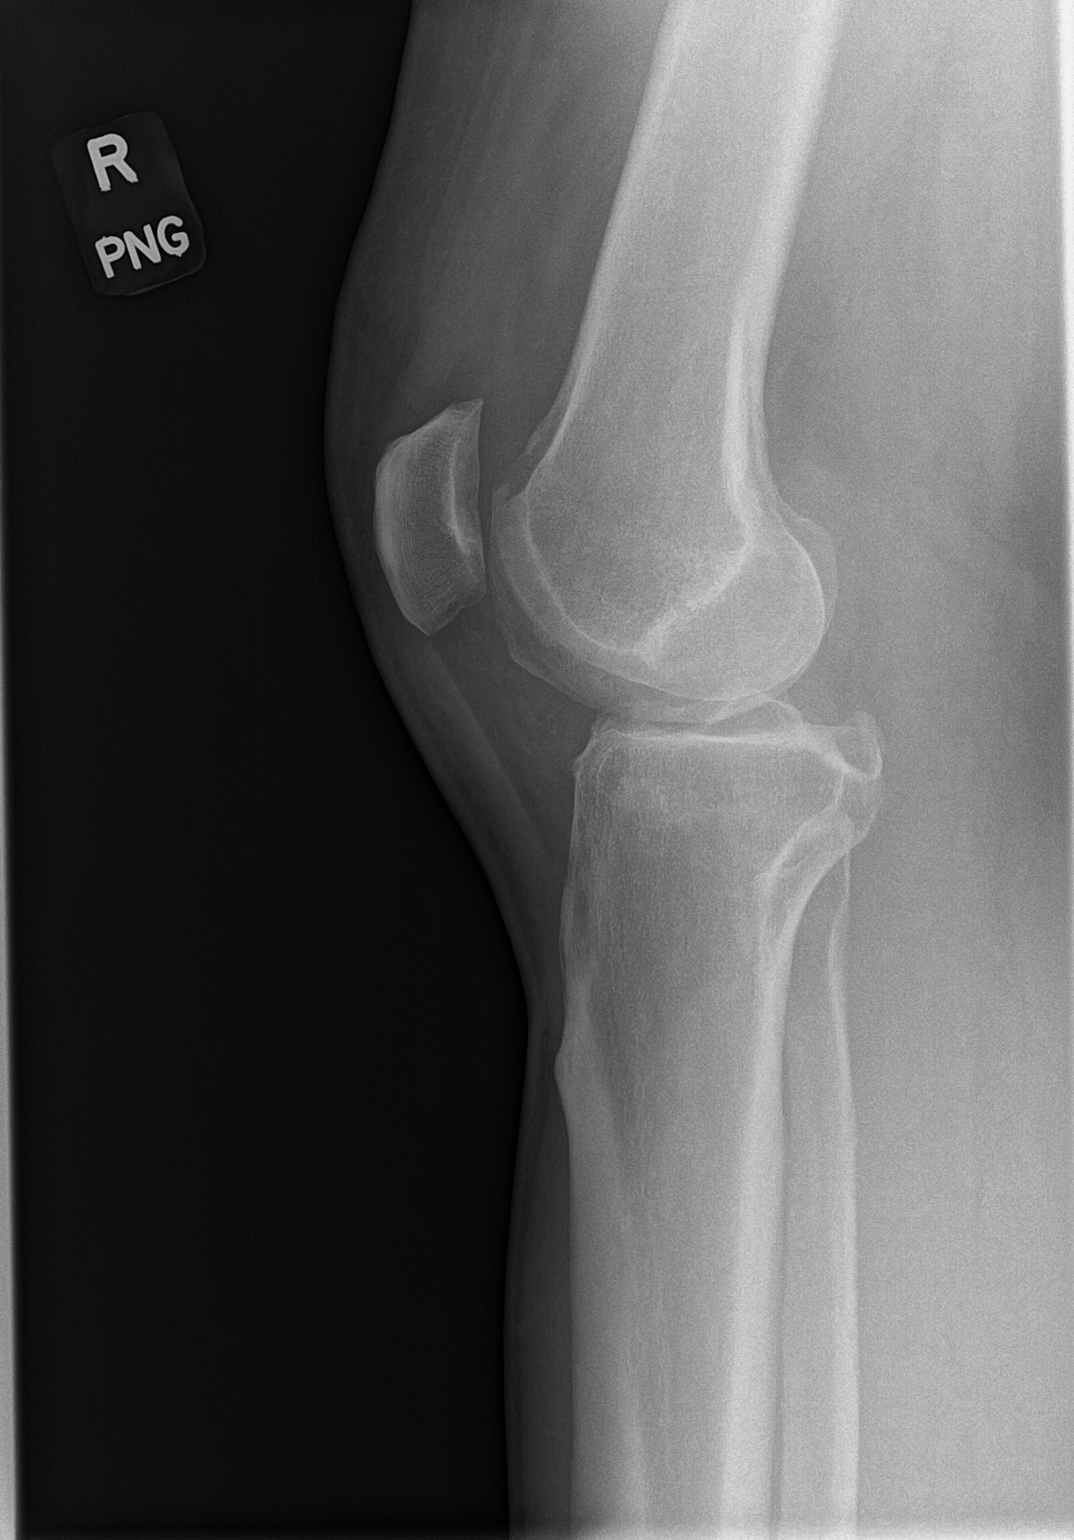

[patella]
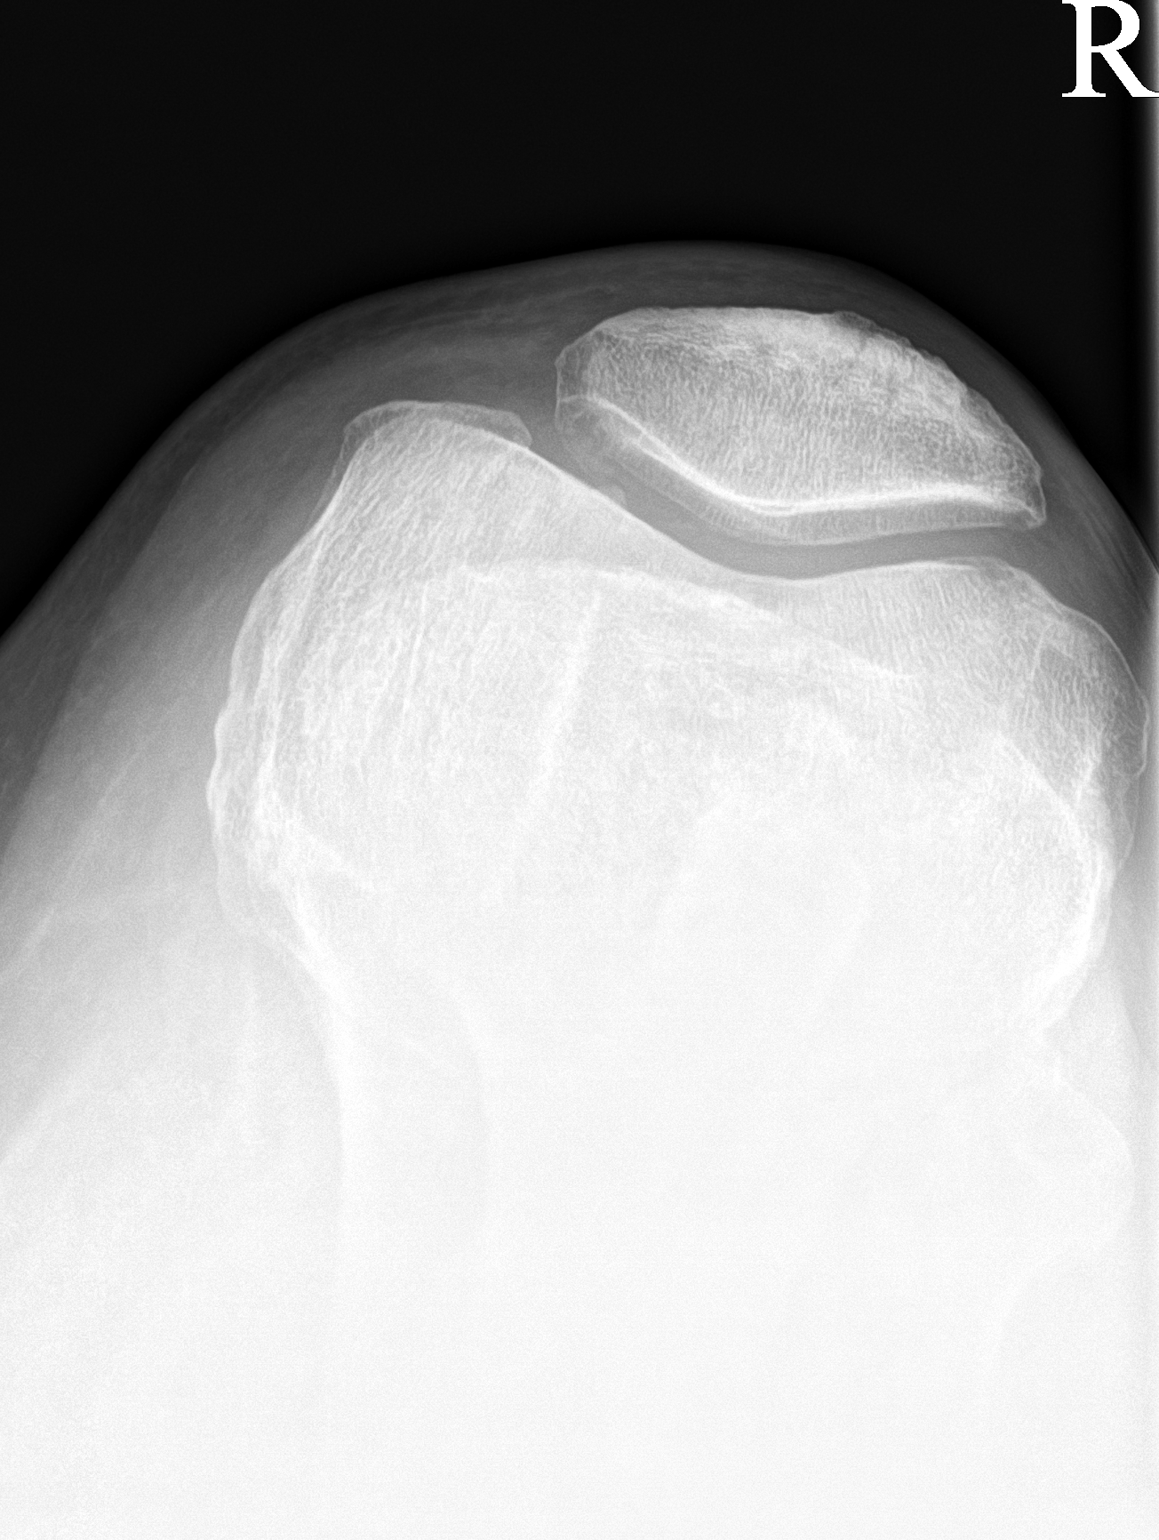

[3 of 3 positions shown; findings below may reference images not displayed]

FINDINGS: Mild medial tibiofemoral joint space narrowing. Trace
tricompartmental peripheral spurring. Shallow trochlear groove.
Trace spurring of the tibial spines. No significant joint effusion.
No erosion, bony destruction, or focal bone abnormality.
IMPRESSION: Mild tricompartmental osteoarthritis with medial tibiofemoral joint
space narrowing.

## 2020-12-14 MED ORDER — VALSARTAN-HYDROCHLOROTHIAZIDE 320-25 MG PO TABS: 1.0000 | ORAL_TABLET | Freq: Every day | ORAL | 3 refills | Status: DC

## 2020-12-14 NOTE — Patient Instructions (Addendum)
Thank you for coming in today.  Please get an Xray today before you leave  Please use Voltaren gel (Generic Diclofenac Gel) up to 4x daily for pain as needed.  This is available over-the-counter as both the name brand Voltaren gel and the generic diclofenac gel.  I recommend you obtained a compression sleeve to help with your joint problems. There are many options on the market however I recommend obtaining a full knee Body Helix compression sleeve.  You can find information (including how to appropriate measure yourself for sizing) can be found at www.Body http://www.lambert.com/.  Many of these products are health savings account (HSA) eligible.   You can use the compression sleeve at any time throughout the day but is most important to use while being active as well as for 2 hours post-activity.   It is appropriate to ice following activity with the compression sleeve in place.  Please get an Xray today before you leave  You should hear from MRI scheduling within 1 week. If you do not hear please let me know.

## 2020-12-14 NOTE — Patient Instructions (Addendum)
Medication Instructions:   INCREASE Valsartan-Hydrochlorothiazide 320-25 mg daily  *If you need a refill on your cardiac medications before your next appointment, please call your pharmacy*  Lab Work: Your physician recommends that you return for lab work in 1 week:   BMET  If you have labs (blood work) drawn today and your tests are completely normal, you will receive your results only by: Marland Kitchen MyChart Message (if you have MyChart) OR . A paper copy in the mail If you have any lab test that is abnormal or we need to change your treatment, we will call you to review the results.  Testing/Procedures: NONE ordered at this time of appointment   Follow-Up: At [facility], you and your health needs are our priority.  As part of our continuing mission to provide you with exceptional heart care, we have created designated Provider Care Teams.  These Care Teams include your primary Cardiologist (physician) and Advanced Practice Providers (APPs -  Physician Assistants and Nurse Practitioners) who all work together to provide you with the care you need, when you need it.  Your next appointment:   3 month(s)  The format for your next appointment:   In Person  Provider:   Skeet Latch, MD   Other Instructions

## 2020-12-16 NOTE — Progress Notes (Signed)
Right knee x-ray shows mild arthritis.

## 2020-12-25 ENCOUNTER — Ambulatory Visit (INDEPENDENT_AMBULATORY_CARE_PROVIDER_SITE_OTHER): Payer: 59

## 2020-12-25 ENCOUNTER — Other Ambulatory Visit: Payer: Self-pay

## 2020-12-25 DIAGNOSIS — M25561 Pain in right knee: Secondary | ICD-10-CM

## 2020-12-25 IMAGING — MR MR KNEE*R* W/O CM
7 series · 40 of 40 positions shown · non-contrast
Comparison: None.

CLINICAL DATA: Right knee pain 4-5 months

EXAM:
MRI OF THE RIGHT KNEE WITHOUT CONTRAST
TECHNIQUE: Multiplanar, multisequence MR imaging of the knee was performed. No
intravenous contrast was administered.

[Series 3: T2 fat-sat · axial · 4.0mm · 0.53mm/px · z∈[-84,+86]mm · 6 of 35 slices shown (1 of 3)]
[im 1/35]
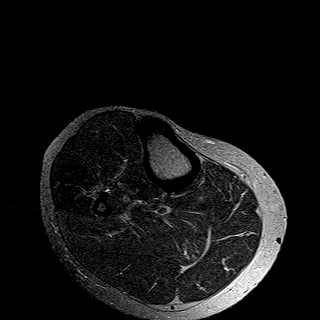
[im 7/35]
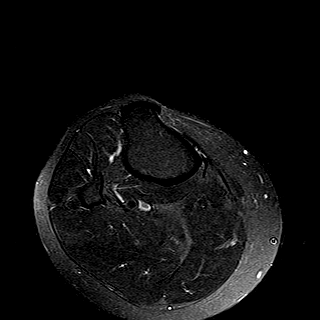
[im 14/35]
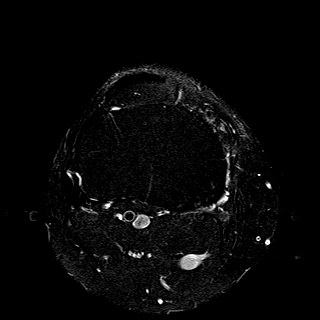
[im 21/35]
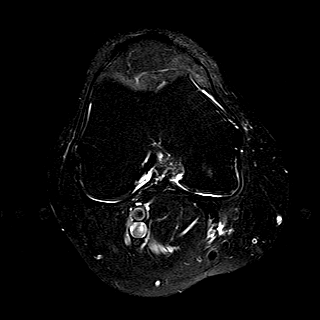
[im 28/35]
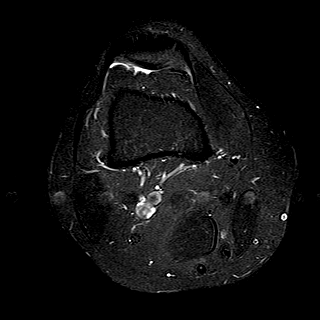
[im 35/35]
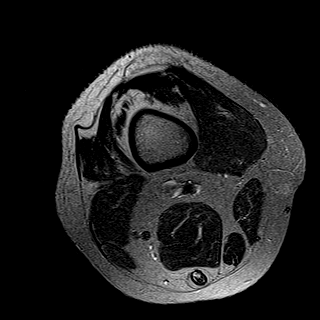

[Series 4: T1 · coronal · 4.0mm · 0.62mm/px · 6 of 31 slices shown]
[im 1/31]
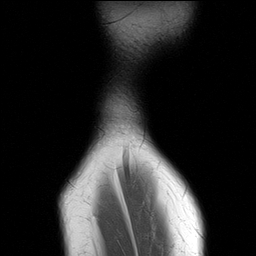
[im 7/31]
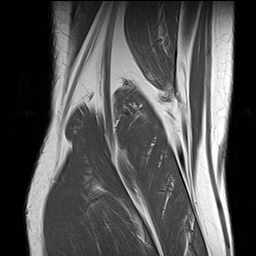
[im 13/31]
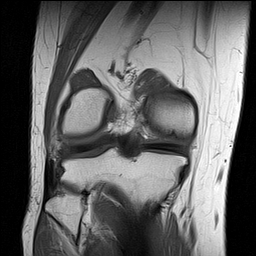
[im 19/31]
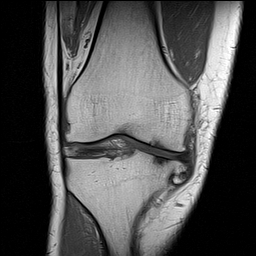
[im 25/31]
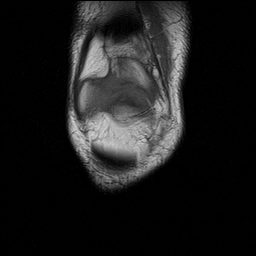
[im 31/31]
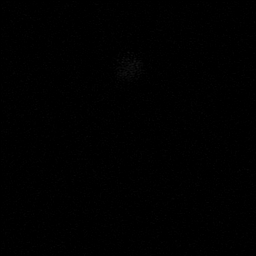

[Series 5: T2 fat-sat · coronal · 4.0mm · 0.62mm/px · 6 of 31 slices shown (2 of 3)]
[im 1/31]
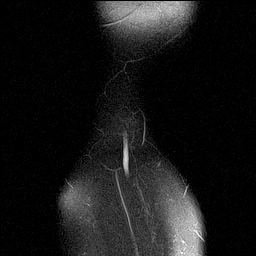
[im 7/31]
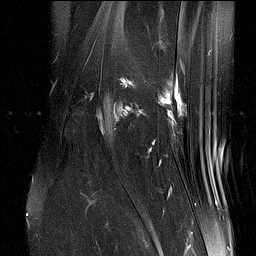
[im 13/31]
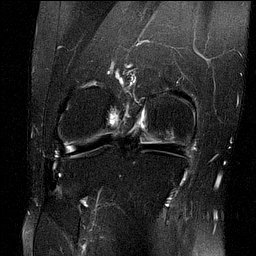
[im 19/31]
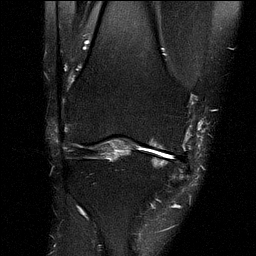
[im 25/31]
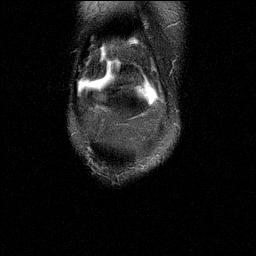
[im 31/31]
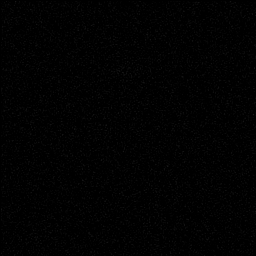

[Series 6: PD fat-sat · coronal · 4.0mm · 0.62mm/px · 6 of 31 slices shown (1 of 3)]
[im 1/31]
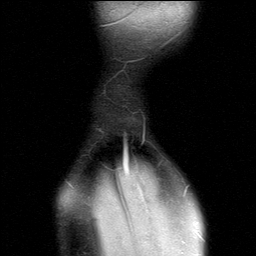
[im 7/31]
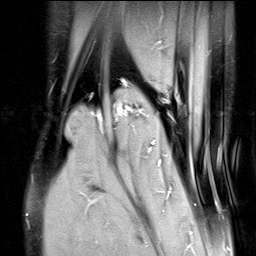
[im 13/31]
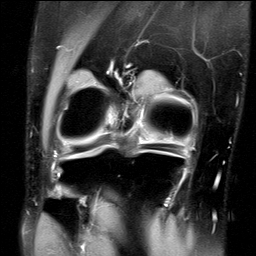
[im 19/31]
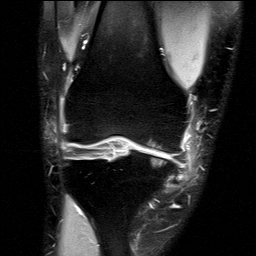
[im 25/31]
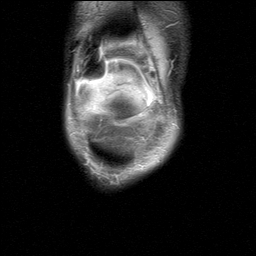
[im 31/31]
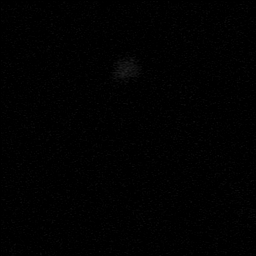

[Series 7: PD fat-sat · sagittal · 3.0mm · 0.62mm/px · 6 of 35 slices shown (2 of 3)]
[im 1/35]
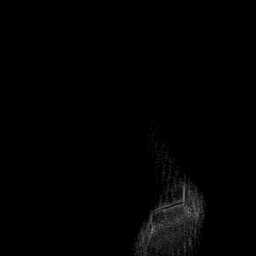
[im 7/35]
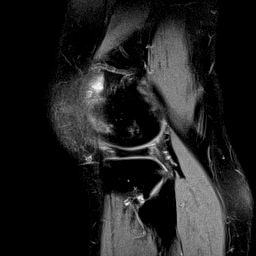
[im 14/35]
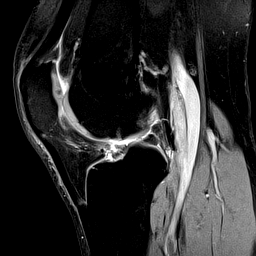
[im 21/35]
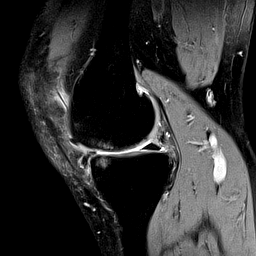
[im 28/35]
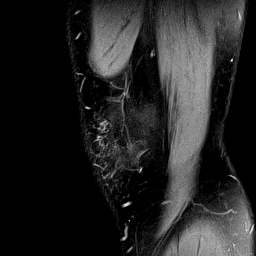
[im 35/35]
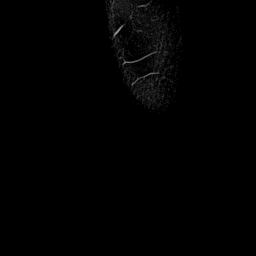

[Series 8: T2 fat-sat · sagittal · 3.0mm · 0.62mm/px · 6 of 35 slices shown (3 of 3)]
[im 1/35]
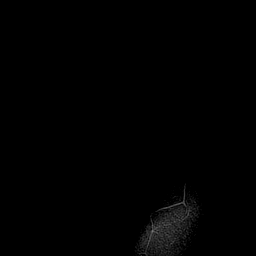
[im 7/35]
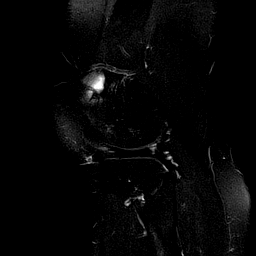
[im 14/35]
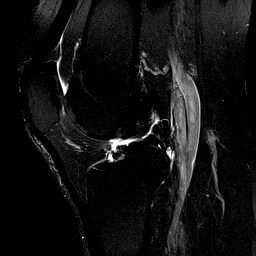
[im 21/35]
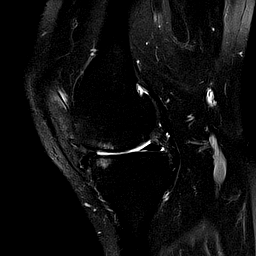
[im 28/35]
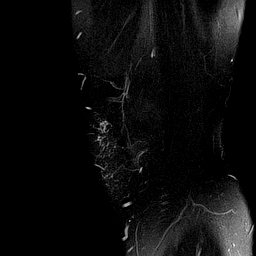
[im 35/35]
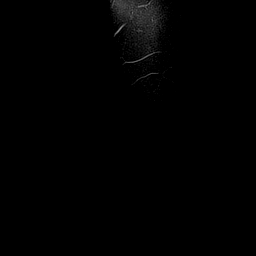

[Series 9: PD fat-sat · coronal · 2.0mm · 0.62mm/px · 4 of 19 slices shown (3 of 3)]
[im 1/19]
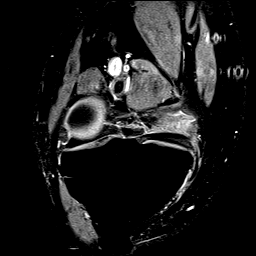
[im 7/19]
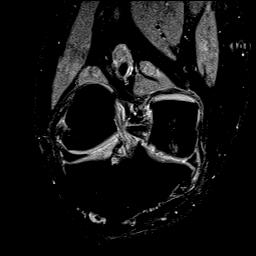
[im 13/19]
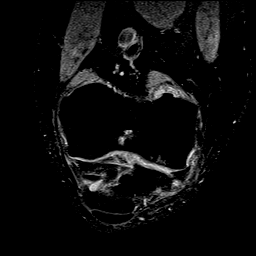
[im 19/19]
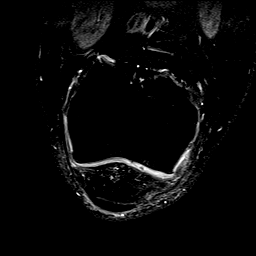

[40 of 40 positions shown; findings below may reference images not displayed]

FINDINGS: MENISCI

Medial: Degeneration of the medial meniscus. No discrete tear.
Peripheral meniscal extrusion.

Lateral: Intact.

LIGAMENTS

Cruciates: ACL and PCL are intact.

Collaterals: Medial collateral ligament is intact. Lateral
collateral ligament complex is intact.

CARTILAGE

Patellofemoral: Partial-thickness cartilage loss of the trochlear
groove and medial trochlea. Cartilage fissuring of the medial
patellar facet.

Medial: Full-thickness cartilage loss of the medial femorotibial
compartment with subchondral reactive marrow edema in the medial
femoral condyle and medial tibial plateau.

Lateral:  No chondral defect.

JOINT: No joint effusion. Normal AMIIRATO. No plical
thickening.

POPLITEAL FOSSA: Popliteus tendon is intact. Tiny Baker's cyst.

EXTENSOR MECHANISM: Intact quadriceps tendon. Intact patellar
tendon. Intact lateral patellar retinaculum. Intact medial patellar
retinaculum. Intact MPFL.

BONES: No aggressive osseous lesion. No fracture or dislocation.

Other: No fluid collection or hematoma. Muscles are normal.
IMPRESSION: 1. Full-thickness cartilage loss of the medial femorotibial
compartment with subchondral reactive marrow edema in the medial
femoral condyle and medial tibial plateau.
2. Partial-thickness cartilage loss of the trochlear groove and
medial trochlea. Cartilage fissuring of the medial patellar facet.

## 2020-12-27 NOTE — Progress Notes (Signed)
MRI knee shows full-thickness cartilage loss of the medial (inside) part of the knee.  The bone underneath where the cartilage is supposed to be is irritated and bruised.  You also have some cartilage loss underneath the kneecap.  Please return to clinic to go over these results in full detail and discuss treatment plan and options.

## 2020-12-29 NOTE — Progress Notes (Signed)
I, Wendy Poet, LAT, ATC, am serving as scribe for Dr. [name].  Dwayne Sawyer is a 50 y.o. adult who presents to [place] [facility] today for f/u of his R knee pain and R knee MRI review.  He was last seen by Dr. Georgina Snell on 12/14/20 and had a R knee injection.  He was also advised to use Voltaren gel and a knee compression sleeve.  Since his last visit, pt reports that his R knee is feeling a lot better.  He states that he feels like the injections are preventative maintenance but aren't really solving the problem.  Diagnostic imaging: R knee MRI- 12/25/20; R knee XR- 12/14/20   Pertinent review of systems: No fevers or chills  Relevant historical information: Carcinoid tumor   Exam:  BP 128/88 (BP Location: Right Arm, Patient Position: Sitting, Cuff Size: Large)   Pulse 69   Ht 5\' 9"  (1.753 m)   Wt 260 lb 3.2 oz (118 kg)   SpO2 97%   BMI 38.42 kg/m  General: Well Developed, well nourished, and in no acute distress.   MSK: Right knee tender palpation medial joint line normal motion with crepitation.    Lab and Radiology Results  EXAM: MRI OF THE RIGHT KNEE WITHOUT CONTRAST   TECHNIQUE: Multiplanar, multisequence MR imaging of the knee was performed. No intravenous contrast was administered.   COMPARISON:  None.   FINDINGS: MENISCI   Medial: Degeneration of the medial meniscus. No discrete tear. Peripheral meniscal extrusion.   Lateral: Intact.   LIGAMENTS   Cruciates: ACL and PCL are intact.   Collaterals: Medial collateral ligament is intact. Lateral collateral ligament complex is intact.   CARTILAGE   Patellofemoral: Partial-thickness cartilage loss of the trochlear groove and medial trochlea. Cartilage fissuring of the medial patellar facet.   Medial: Full-thickness cartilage loss of the medial femorotibial compartment with subchondral reactive marrow edema in the medial femoral condyle and medial tibial plateau.   Lateral:   No chondral defect.   JOINT: No joint effusion. Normal Hoffa's fat-pad. No plical thickening.   POPLITEAL FOSSA: Popliteus tendon is intact. Tiny Baker's cyst.   EXTENSOR MECHANISM: Intact quadriceps tendon. Intact patellar tendon. Intact lateral patellar retinaculum. Intact medial patellar retinaculum. Intact MPFL.   BONES: No aggressive osseous lesion. No fracture or dislocation.   Other: No fluid collection or hematoma. Muscles are normal.   IMPRESSION: 1. Full-thickness cartilage loss of the medial femorotibial compartment with subchondral reactive marrow edema in the medial femoral condyle and medial tibial plateau. 2. Partial-thickness cartilage loss of the trochlear groove and medial trochlea. Cartilage fissuring of the medial patellar facet.     Electronically Signed   By: Kathreen Devoid   On: 12/26/2020 07:33   I, [name], personally (independently) visualized and performed the interpretation of the images attached in this note.       Assessment and Plan: 50 y.o. adult with right medial knee pain due to significant chondromalacia and bone edema.  Patient has had significant improvement with steroid injection at the last visit.  Ultimately it is likely he will need a knee replacement or least partial knee replacement.  However for now he is doing well.  Plan advance activity as tolerated and use Voltaren gel.  Should pain return certainly could repeat steroid injection or proceed with hyaluronic acid injections.  If this does not work would recommend surgery consultation to discuss surgical options of partial knee replacement are complete knee replacement or  even possibly microfracture however I think this is a less good option.   Discussed warning signs or symptoms. Please see discharge instructions. Patient expresses understanding.   The above documentation has been reviewed and is accurate and complete [name], M.D.   Total encounter time 20 minutes  including face-to-face time with the patient and, reviewing past medical record, and charting on the date of service.   Reviewed MRI discussed treatment plan and options.

## 2020-12-30 ENCOUNTER — Ambulatory Visit (INDEPENDENT_AMBULATORY_CARE_PROVIDER_SITE_OTHER): Payer: 59 | Admitting: Family Medicine

## 2020-12-30 ENCOUNTER — Other Ambulatory Visit: Payer: Self-pay

## 2020-12-30 ENCOUNTER — Encounter: Payer: Self-pay | Admitting: Family Medicine

## 2020-12-30 VITALS — BP 128/88 | HR 69 | Ht 69.0 in | Wt 260.2 lb

## 2020-12-30 DIAGNOSIS — M23303 Other meniscus derangements, unspecified medial meniscus, right knee: Secondary | ICD-10-CM | POA: Insufficient documentation

## 2020-12-30 DIAGNOSIS — M25561 Pain in right knee: Secondary | ICD-10-CM | POA: Diagnosis not present

## 2020-12-30 DIAGNOSIS — M1711 Unilateral primary osteoarthritis, right knee: Secondary | ICD-10-CM | POA: Insufficient documentation

## 2020-12-30 NOTE — Patient Instructions (Signed)
Thank you for coming in today.   Ok to advance activity as tolerated.   I can repeat the injection every 3 months.   We can also do the gel shots. I need a little warning for that.   Eventually I think you may need a total knee replacement.   Return as needed.

## 2021-01-09 ENCOUNTER — Other Ambulatory Visit: Payer: Self-pay | Admitting: Cardiovascular Disease

## 2021-01-20 ENCOUNTER — Other Ambulatory Visit: Payer: Self-pay

## 2021-01-20 ENCOUNTER — Ambulatory Visit (HOSPITAL_COMMUNITY)
Admission: RE | Admit: 2021-01-20 | Discharge: 2021-01-20 | Disposition: A | Discharge status: Home / Self Care | Payer: 59 | Source: Ambulatory Visit | Attending: Hematology | Admitting: Hematology

## 2021-01-20 DIAGNOSIS — D3A00 Benign carcinoid tumor of unspecified site: Secondary | ICD-10-CM | POA: Insufficient documentation

## 2021-01-20 LAB — CBC WITH DIFFERENTIAL/PLATELET
Abs Immature Granulocytes: 0.05 10*3/uL (ref 0.00–0.07)
Basophils Absolute: 0.1 10*3/uL (ref 0.0–0.1)
Basophils Relative: 1 %
Eosinophils Absolute: 0.3 10*3/uL (ref 0.0–0.5)
Eosinophils Relative: 3 %
HCT: 39.5 % (ref 39.0–52.0)
Hemoglobin: 13 g/dL (ref 13.0–17.0)
Immature Granulocytes: 1 %
Lymphocytes Relative: 9 %
Lymphs Abs: 0.8 10*3/uL (ref 0.7–4.0)
MCH: 30 pg (ref 26.0–34.0)
MCHC: 32.9 g/dL (ref 30.0–36.0)
MCV: 91 fL (ref 80.0–100.0)
Monocytes Absolute: 0.8 10*3/uL (ref 0.1–1.0)
Monocytes Relative: 8 %
Neutro Abs: 7.5 10*3/uL (ref 1.7–7.7)
Neutrophils Relative %: 78 %
Platelets: 295 10*3/uL (ref 150–400)
RBC: 4.34 MIL/uL (ref 4.22–5.81)
RDW: 13 % (ref 11.5–15.5)
WBC: 9.5 10*3/uL (ref 4.0–10.5)
nRBC: 0 % (ref 0.0–0.2)

## 2021-01-20 IMAGING — NM NM [PERSON_NAME] ADMINISTRATION
1 series · 1 of 1 positions shown · non-contrast
Comparison: none

CLINICAL DATA: [age] year-old male with metastatic
neuroendocrine tumor. Well differentiated tumor with somatostatin
receptor is identified within the liver by DOTATATE PET CT scan.

EXAM:
NUCLEAR MEDICINE LUTATHERA ADMINISTRATION
TECHNIQUE: Infusion: The nuclear medicine technologist and I personally
verified the dose activity (202 mCi) to be delivered as specified in
the written directive (200 mCi), and verified the patient
identification via 2 separate methods. 20 gauge IV were started in
the antecubital veins. Anti-emetics were administered by nursing
staff. CEJA acid renal protection was initiated 30 minutes prior to
Lu 177 DOTATATE (Lutathera) infusion and continued continuously for
4 hours. Lutathera infusion was administered over 30 minutes.

[Series 1: static · 2.07mm/px · 1 of 1 slices shown]
[im 1/1]
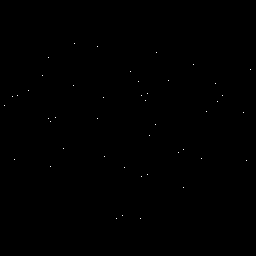

[1 of 1 positions shown; findings below may reference images not displayed]

The total administered dose was 200.4 mCi Lu 177 DOTATATE.

The entire IV tubing, venocatheter, stopcock and syringes was
removed in total, placed in a disposal bag and sent for assay of the
residual activity, which will be reported at a later time in our EMR
by the physics staff. Pressure was applied to the venipuncture
sites, and a compression bandage placed. Radiation Safety personnel
were present to perform the discharge survey, as detailed on their
documentation.

Patient received 120 mg long-acting lanreotide injection 4 hours
after Lutathera effusion in the nuclear medicine department.

RADIOPHARMACEUTICALS:  200.4 mCi Lu 177 DOTATATE
FINDINGS: Diagnosis: Metastatic neuroendocrine tumor.

Current Infusion: 3

Planned Infusions: 4

Patient reports minimal interval symptoms following therapy. The
patient's most recent blood counts were reviewed and remains a good
candidate to proceed with Lutathera. The patient was situated in an
infusion suite and administered Lutathera as above. Patient will
follow-up with referring oncologist for interval serum laboratories
(CBC and CMP) in approximately 4 weeks.

Patient received 120 mg long-acting lanreotide injection 4 hours
after Lutathera effusion in the nuclear medicine department.
IMPRESSION: Third Lu 177 DOTATATE treatment for metastatic neuroendocrine tumor.
The patient tolerated the infusion well. The patient will return in
8 weeks for ongoing care.

## 2021-01-20 MED ORDER — SODIUM CHLORIDE 0.9 % IV SOLN
500.0000 mL | Freq: Once | INTRAVENOUS | Status: AC
  Administered 2021-01-20: 150 mL via INTRAVENOUS

## 2021-01-20 MED ORDER — LUTETIUM LU 177 DOTATATE 370 MBQ/ML IV SOLN
200.0000 | Freq: Once | INTRAVENOUS | Status: AC
  Administered 2021-01-20: 200 via INTRAVENOUS

## 2021-01-20 MED ORDER — OCTREOTIDE ACETATE 500 MCG/ML IJ SOLN
INTRAMUSCULAR | Status: AC
  Filled 2021-01-20: qty 1

## 2021-01-20 MED ORDER — ONDANSETRON HCL 8 MG PO TABS: 8.0000 mg | ORAL_TABLET | Freq: Two times a day (BID) | ORAL | 0 refills | Status: DC | PRN

## 2021-01-20 MED ORDER — AMINO ACID RADIOPROTECTANT - L-LYSINE 2.5%/L-ARGININE 2.5% IN NS
250.0000 mL/h | INTRAVENOUS | Status: AC
  Administered 2021-01-20: 250 mL/h via INTRAVENOUS
  Filled 2021-01-20: qty 1000

## 2021-01-20 MED ORDER — OCTREOTIDE ACETATE 30 MG IM KIT
PACK | INTRAMUSCULAR | Status: AC
  Filled 2021-01-20: qty 1

## 2021-01-20 MED ORDER — LANREOTIDE ACETATE 120 MG/0.5ML ~~LOC~~ SOLN
120.0000 mg | Freq: Once | SUBCUTANEOUS | Status: AC
  Administered 2021-01-20: 120 mg via SUBCUTANEOUS

## 2021-01-20 MED ORDER — LANREOTIDE ACETATE 120 MG/0.5ML ~~LOC~~ SOLN
SUBCUTANEOUS | Status: AC
  Filled 2021-01-20: qty 120

## 2021-01-20 MED ORDER — SODIUM CHLORIDE 0.9 % IV SOLN
8.0000 mg | Freq: Once | INTRAVENOUS | Status: AC
  Administered 2021-01-20: 8 mg via INTRAVENOUS
  Filled 2021-01-20: qty 8

## 2021-01-20 MED ORDER — OCTREOTIDE ACETATE 30 MG IM KIT: 30.0000 mg | PACK | Freq: Once | INTRAMUSCULAR | Status: DC

## 2021-01-20 MED ORDER — PROCHLORPERAZINE EDISYLATE 10 MG/2ML IJ SOLN: 10.0000 mg | Freq: Four times a day (QID) | INTRAMUSCULAR | Status: DC | PRN

## 2021-01-20 MED ORDER — OCTREOTIDE ACETATE 500 MCG/ML IJ SOLN: 500.0000 ug | Freq: Once | INTRAMUSCULAR | Status: DC | PRN

## 2021-01-20 NOTE — Progress Notes (Signed)
  Pt tolerated Lutathera treatment without any complication.Marland Kitchen

## 2021-01-26 ENCOUNTER — Ambulatory Visit (HOSPITAL_COMMUNITY): Payer: MEDICAID

## 2021-02-03 ENCOUNTER — Inpatient Hospital Stay: Payer: 59

## 2021-02-03 ENCOUNTER — Inpatient Hospital Stay: Payer: 59 | Attending: Nurse Practitioner | Admitting: Hematology

## 2021-02-03 DIAGNOSIS — C7B8 Other secondary neuroendocrine tumors: Secondary | ICD-10-CM | POA: Insufficient documentation

## 2021-02-03 DIAGNOSIS — C7A8 Other malignant neuroendocrine tumors: Secondary | ICD-10-CM | POA: Insufficient documentation

## 2021-02-04 ENCOUNTER — Telehealth: Payer: Self-pay | Admitting: Hematology

## 2021-02-04 NOTE — Telephone Encounter (Signed)
Scheduled appointments per 07/15 sch msg. Left message.

## 2021-02-11 ENCOUNTER — Inpatient Hospital Stay: Payer: 59

## 2021-02-11 ENCOUNTER — Other Ambulatory Visit: Payer: Self-pay

## 2021-02-11 ENCOUNTER — Encounter: Payer: Self-pay | Admitting: Hematology

## 2021-02-11 ENCOUNTER — Inpatient Hospital Stay (HOSPITAL_BASED_OUTPATIENT_CLINIC_OR_DEPARTMENT_OTHER): Payer: 59 | Admitting: Hematology

## 2021-02-11 VITALS — BP 182/107 | HR 86 | Temp 99.0°F | Resp 18 | Wt 262.6 lb

## 2021-02-11 DIAGNOSIS — D3A011 Benign carcinoid tumor of the jejunum: Secondary | ICD-10-CM | POA: Diagnosis not present

## 2021-02-11 DIAGNOSIS — C7A011 Malignant carcinoid tumor of the jejunum: Secondary | ICD-10-CM

## 2021-02-11 DIAGNOSIS — C7A8 Other malignant neuroendocrine tumors: Secondary | ICD-10-CM | POA: Diagnosis not present

## 2021-02-11 DIAGNOSIS — C7B8 Other secondary neuroendocrine tumors: Secondary | ICD-10-CM | POA: Diagnosis not present

## 2021-02-11 LAB — CBC WITH DIFFERENTIAL (CANCER CENTER ONLY)
Abs Immature Granulocytes: 0.07 10*3/uL (ref 0.00–0.07)
Basophils Absolute: 0.1 10*3/uL (ref 0.0–0.1)
Basophils Relative: 1 %
Eosinophils Absolute: 0.3 10*3/uL (ref 0.0–0.5)
Eosinophils Relative: 3 %
HCT: 39.9 % (ref 39.0–52.0)
Hemoglobin: 13.6 g/dL (ref 13.0–17.0)
Immature Granulocytes: 1 %
Lymphocytes Relative: 8 %
Lymphs Abs: 0.7 10*3/uL (ref 0.7–4.0)
MCH: 29.9 pg (ref 26.0–34.0)
MCHC: 34.1 g/dL (ref 30.0–36.0)
MCV: 87.7 fL (ref 80.0–100.0)
Monocytes Absolute: 0.8 10*3/uL (ref 0.1–1.0)
Monocytes Relative: 9 %
Neutro Abs: 6.6 10*3/uL (ref 1.7–7.7)
Neutrophils Relative %: 78 %
Platelet Count: 250 10*3/uL (ref 150–400)
RBC: 4.55 MIL/uL (ref 4.22–5.81)
RDW: 12.8 % (ref 11.5–15.5)
WBC Count: 8.4 10*3/uL (ref 4.0–10.5)
nRBC: 0 % (ref 0.0–0.2)

## 2021-02-11 LAB — CMP (CANCER CENTER ONLY)
ALT: 20 U/L (ref 0–44)
AST: 16 U/L (ref 15–41)
Albumin: 4.1 g/dL (ref 3.5–5.0)
Alkaline Phosphatase: 56 U/L (ref 38–126)
Anion gap: 13 (ref 5–15)
BUN: 21 mg/dL — ABNORMAL HIGH (ref 6–20)
CO2: 24 mmol/L (ref 22–32)
Calcium: 9.3 mg/dL (ref 8.9–10.3)
Chloride: 102 mmol/L (ref 98–111)
Creatinine: 1.03 mg/dL (ref 0.61–1.24)
GFR, Estimated: >60 mL/min (ref >60)
Glucose, Bld: 167 mg/dL — ABNORMAL HIGH (ref 70–99)
Potassium: 3.6 mmol/L (ref 3.5–5.1)
Sodium: 139 mmol/L (ref 135–145)
Total Bilirubin: 0.3 mg/dL (ref 0.3–1.2)
Total Protein: 7.8 g/dL (ref 6.5–8.1)

## 2021-02-11 NOTE — Progress Notes (Signed)
[place]   [phone] [phone]   Clinic Follow up Note   Patient Care Team: [name], MD as PCP - General (Family Medicine) Skeet Latch, MD as PCP - Cardiology (Cardiology) Gregor Hams, MD as Consulting Physician (Sports Medicine)  Date of Service:  02/11/2021  CHIEF COMPLAINT: F/u neuroendocrine tumor   SUMMARY OF ONCOLOGIC HISTORY: Oncology History Overview Note  Cancer Staging No matching staging information was found for the patient.    Carcinoid tumor of small intestine  06/11/2019 Imaging   US Abdomen 06/11/19  IMPRESSION: 1. There is a solid mass in the right lobe of the liver which based on current measurements may have enlarged since the prior study from 2015. Images from the previous MR at that time cannot be retrieved. Given this circumstance, it may be prudent to correlate with pre and serial post-contrast MR or CT of the liver to further evaluate. There is underlying diffuse increase in liver echogenicity, a finding indicative of hepatic steatosis.   2. Multiple loops of fluid-filled bowel. Question a degree of ileus or enteritis.   3. Study otherwise unremarkable. Note that much of the common bile duct is obscured by gas.    06/11/2019 Imaging   CT AP W Contrast 06/11/19   IMPRESSION: 1. High-grade small bowel obstruction secondary to desmoplastic response in the central right abdominal mesentery centered on a 4 cm calcified irregular/spiculated soft tissue lesion. Abrupt small bowel transition zone is identified immediately lead adjacent to this mesenteric lesion and multiple adjacent bowel loops are tethered into this region with mesenteric edema/congestion. The loop of small bowel immediately proximal to the transition zone shows mild circumferential wall thickening but no pneumatosis. 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 2. Small lymph nodes in the abnormal right mesentery suggest additional metastatic involvement. 3. Heterogeneous liver parenchyma, likely secondary to geographic fatty deposition. There is a focal 14 mm low-density lesion in the right liver. The patient had an MRI in the [facility] system on 08/09/2013 to evaluate a right liver lesion, but those images are not available. Follow-up MRI without and with contrast recommended to exclude metastatic disease. 4. Tiny sclerotic foci in the T12 vertebral body in both femoral heads are likely benign, but close attention on follow-up recommended. 5.  Aortic Atherosclerois (ICD10-170.0)      06/12/2019 Surgery    EXPLORATORY LAPAROTOMY WITH SMALL BOWEL RESECTION by Dr. 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 Interval resection small bowel and mesenteric mass with no evidence residual mesenteric or small bowel neuroendocrine tumor. 2. Large lesion in RIGHT hepatic lobe with intense radiotracer activity consistent with well differentiated neuroendocrine tumor metastasis. Smaller lesion in the anterior LEFT hepatic lobe is concerning for second hepatic metastasis. 3. Pulmonary nodule measuring 6 mm in LEFT upper lobe is not have radiotracer activity. Smaller RIGHT upper lobe nodule. Recommend close attention on follow-up.   08/01/2019 -  05/07/2020 Chemotherapy   Lanreotide injection monthly starting  08/01/19. Stopped after 05/07/20 due to disease progression     08/04/2019 Imaging   MRI abdomen  IMPRESSION: 1. The previously noted lesion in the right lobe of the liver has grown slightly compared to 2015. In addition, today's study demonstrates 2 smaller lesions with similar imaging characteristics. Given the activity on the recent PET Dotatate scan, these lesions are all compatible with small neuroendocrine metastatic lesions. 2. Hepatic steatosis.   02/10/2020 Imaging   MRI abdomen  IMPRESSION: 1. The dominant right hepatic lobe mass has mildly increased in size compared to the prior exam. This currently measures 5.4 by 4.5 cm, previously 4.9 by 4.2 cm. 2. Nine additional small T2 hyperintense foci in the liver are stable, and while the small size makes these less specific, there likely small metastatic lesions. Today's exam has the benefit of less motion artifact compared to the 06/03/2020 MRI, and I was able to pick at each of these tiny lesions on the prior exam in retrospect. It is conceivable that 1 or more of these tiny lesions could represent small hemangiomas, although I do not observe classic delayed enhancement pattern. 3. Diffuse hepatic steatosis.   05/31/2020 PET scan   DOTATATE PET  IMPRESSION: 1. Progression of well differentiated neuroendocrine tumor hepatic metastasis with increase in size and radiotracer activity of dominant lesion in the LEFT hepatic lobe and multiple small lesions as described above. 2. New small focus of radiotracer activity within the head of the pancreas. 3. Increased hepatic steatosis.      Chemotherapy   Lutathera treatments on 08/11/20, 10/06/20, 12/01/20, 01/26/21         CURRENT THERAPY:  Pending last Lutathera treatments on 03/17/2021 Lanreotide injection every 4 week   INTERVAL HISTORY:  Dwayne Sawyer is here for a follow up. He presents to the clinic alone. He had three treatments of Lutathera, he tolerated well  overall. He has started oral estrogen and progesterone about 3 months ago He states that he does have mild diarrhea and flushing when he does not get Lanreotide injection monthly. He is otherwise doing very well.  All other systems were reviewed with the patient and are negative.  MEDICAL HISTORY:  Past Medical History:  Diagnosis Date   Arthritis    Cancer ([place])    Family history of carcinoid tumor    High cholesterol    Hypertension     SURGICAL HISTORY: Past Surgical History:  Procedure Laterality Date   HERNIA REPAIR     LAPAROTOMY N/A 06/12/2019   Procedure: EXPLORATORY LAPAROTOMY WITH SMALL BOWEL RESECTION;  Surgeon: Johnathan Hausen, MD;  Location: [facility];  Service: General;  Laterality: N/A;   SHOULDER SURGERY     SMALL INTESTINE SURGERY      I have reviewed the social history and family history with the patient and they are unchanged from previous note.  ALLERGIES:  has No Known Allergies.  MEDICATIONS:  Current Outpatient Medications  Medication Sig Dispense Refill   amLODipine (NORVASC) 10 MG tablet Take 1 tablet (10 mg total) by mouth daily. 90 tablet 1   atorvastatin (LIPITOR) 10 MG tablet TAKE ONE TABLET BY MOUTH DAILY AT 6:00 IN THE EVENING 90 tablet 1   carvedilol (COREG) 25 MG tablet Take 1 tablet (25 mg total) by mouth 2 (two) times daily with a meal. 180 tablet 3   estrogens, conjugated, (PREMARIN) 0.3 MG tablet Take 0.3 mg by mouth daily. Take 4 mg in the morning  an 2 in evening.     metFORMIN (GLUCOPHAGE) 500 MG tablet Take 1 tablet (500 mg total) by mouth daily with breakfast. 90 tablet 1   ondansetron (ZOFRAN) 8 MG tablet Take 1 tablet (8 mg total) by mouth 2 (two) times daily as needed for nausea or vomiting. 20 tablet 0   progesterone 200 MG SUPP Place 200 mg vaginally at bedtime.     valsartan-hydrochlorothiazide (DIOVAN HCT) 320-25 MG tablet Take 1 tablet by mouth daily. 90 tablet 3   No current facility-administered medications for this visit.     PHYSICAL EXAMINATION: ECOG PERFORMANCE STATUS: 0 - Asymptomatic  Vitals:   02/11/21 1457  BP: (!) 182/107  Pulse: 86  Resp: 18  Temp: 99 F (37.2 C)  SpO2: 97%    Filed Weights   02/11/21 1457  Weight: 262 lb 9.6 oz (119.1 kg)     GENERAL:alert, no distress and comfortable SKIN: skin color, texture, turgor are normal, no rashes or significant lesions ABDOMEN:abdomen soft, non-tender and normal bowel sounds Musculoskeletal:no cyanosis of digits and no clubbing  NEURO: alert & oriented x 3 with fluent speech, no focal motor/sensory deficits  LABORATORY DATA:  I have reviewed the data as listed CBC Latest Ref Rng & Units 02/11/2021 01/20/2021 11/04/2020  WBC 4.0 - 10.5 K/uL 8.4 9.5 9.8  Hemoglobin 13.0 - 17.0 g/dL 13.6 13.0 13.3  Hematocrit 39.0 - 52.0 % 39.9 39.5 39.9  Platelets 150 - 400 K/uL 250 295 243     CMP Latest Ref Rng & Units 02/11/2021 11/04/2020 10/06/2020  Glucose 70 - 99 mg/dL 167(H) 142(H) 196(H)  BUN 6 - 20 mg/dL 21(H) 18 21(H)  Creatinine 0.61 - 1.24 mg/dL 1.03 1.00 1.08  Sodium 135 - 145 mmol/L 139 139 134(L)  Potassium 3.5 - 5.1 mmol/L 3.6 4.4 3.7  Chloride 98 - 111 mmol/L 102 99 99  CO2 22 - 32 mmol/L '24 28 27  '$ Calcium 8.9 - 10.3 mg/dL 9.3 9.5 9.1  Total Protein 6.5 - 8.1 g/dL 7.8 7.6 7.4  Total Bilirubin 0.3 - 1.2 mg/dL 0.3 0.4 0.5  Alkaline Phos 38 - 126 U/L 56 58 50  AST 15 - 41 U/L '16 16 19  '$ ALT 0 - 44 U/L '20 17 21      '$ RADIOGRAPHIC STUDIES: I have personally reviewed the radiological images as listed and agreed with the findings in the report. No results found.   ASSESSMENT & PLAN:  Dwayne Sawyer is a 50 y.o. adult with   1. 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 Pathology confirmed well differentiated neuroendocrine tumor, with metastasis to 14 of 32 LNs. Margins clear.  -07/16/19 PET shows two  right hepatic lesion which are intensely hypermetabolic consistent with metastatic NET. There is also a smaller less avid left hepatic lesion that is concerning for metastasis which was not seen on CT from 06/11/19. No other evidence of mets -His MRI abdomen from 08/04/19 shows 3 liver lesions (0.8-4.4cm) compatible with neuroendocrine metastatic lesions.  -He was seen by Dr. Barry Dienes and she did not recommend surgery due to the multiple liver mets.  -He was on monthly Lanreotide injections since 08/01/19, but stopped after 05/07/20 due to disease progression as seen on 05/31/20 DOTATATE PET scan  -He has received three Lutathera treatments, last one is scheduled for 8/25.  -will continue Lanreotide injection every 4 weeks, which helps his diarrhea  and flushing symptoms. -f/u in 3 months    3. RA -previously on methotrexate and enbrel -not currently on therapy     4. Mild Diarrhea  -Secondary to lanreotide injections  -Dwayne Sawyer has had loose mouse-like stool since surgery and has occasional diarrhea at least 3 times a day. Overall manageable.  -I recommend increased water intake and imodium if needed.    5. Hypertension, Hyperglycemia and CKD -BP not well controlled  -He plans to meet his new PCP next month   6. Transgender and estrogen use -Abdourahmane has started estrogen with Mollie Germany, NP with goal of transgender transitioning.  -I checked his neuroendocrine ER and PR which were negative, so there is no contraindication for estrogen use. Can continue  -He has started estrogen and progesterone in early 2020.     PLAN: -He will proceed last Lutathera treatments on August 26 --Lanreotide injection in a week, and continue every 4 weeks -Follow-up in 3 months   No problem-specific Assessment & Plan notes found for this encounter.   No orders of the defined types were placed in this encounter.  All questions were answered. The patient knows to call the clinic with any problems, questions or  concerns. No barriers to learning was detected. The total time spent in the appointment was 30 minutes.     Truitt Merle, MD 02/11/2021

## 2021-02-14 ENCOUNTER — Other Ambulatory Visit: Payer: Self-pay

## 2021-02-14 ENCOUNTER — Ambulatory Visit (HOSPITAL_BASED_OUTPATIENT_CLINIC_OR_DEPARTMENT_OTHER): Payer: 59 | Attending: Cardiology | Admitting: Cardiology

## 2021-02-14 VITALS — Ht 69.0 in | Wt 262.0 lb

## 2021-02-14 DIAGNOSIS — G4733 Obstructive sleep apnea (adult) (pediatric): Secondary | ICD-10-CM | POA: Insufficient documentation

## 2021-02-14 DIAGNOSIS — R400 Somnolence: Secondary | ICD-10-CM

## 2021-02-14 LAB — CHROMOGRANIN A: Chromogranin A (ng/mL): 55.6 ng/mL (ref 0.0–101.8)

## 2021-02-18 ENCOUNTER — Other Ambulatory Visit: Payer: Self-pay

## 2021-02-18 ENCOUNTER — Inpatient Hospital Stay: Payer: 59

## 2021-02-18 VITALS — BP 150/99 | HR 73 | Resp 18

## 2021-02-18 DIAGNOSIS — D3A011 Benign carcinoid tumor of the jejunum: Secondary | ICD-10-CM

## 2021-02-18 DIAGNOSIS — C7A8 Other malignant neuroendocrine tumors: Secondary | ICD-10-CM | POA: Diagnosis not present

## 2021-02-18 MED ORDER — LANREOTIDE ACETATE 120 MG/0.5ML ~~LOC~~ SOLN
120.0000 mg | Freq: Once | SUBCUTANEOUS | Status: AC
  Administered 2021-02-18: 120 mg via SUBCUTANEOUS
  Filled 2021-02-18: qty 120

## 2021-02-18 NOTE — Patient Instructions (Signed)
Lanreotide injection What is this medication? LANREOTIDE (lan REE oh tide) is used to reduce blood levels of growth hormone in patients with a condition called acromegaly. It also works to slow or stop tumor growth in patients with neuroendocrine tumors and treat carcinoid syndrome. This medicine may be used for other purposes; ask your health care provider or pharmacist if you have questions. COMMON BRAND NAME(S): Somatuline [place] What should I tell my care team before I take this medication? They need to know if you have any of these conditions: diabetes gallbladder disease heart disease kidney disease liver disease thyroid disease an unusual or allergic reaction to lanreotide, other medicines, foods, dyes, or preservatives pregnant or trying to get pregnant breast-feeding How should I use this medication? This medicine is for injection under the skin. It is given by a health care professional in a hospital or clinic setting. Contact your pediatrician or health care professional regarding the use of this medicine in children. Special care may be needed. Overdosage: If you think you have taken too much of this medicine contact a poison control center or emergency room at once. NOTE: This medicine is only for you. Do not share this medicine with others. What if I miss a dose? It is important not to miss your dose. Call your doctor or health care professional if you are unable to keep an appointment. What may interact with this medication? This medicine may interact with the following medications: bromocriptine cyclosporine certain medicines for blood pressure, heart disease, irregular heart beat certain medicines for diabetes quinidine terfenadine This list may not describe all possible interactions. Give your health care provider a list of all the medicines, herbs, non-prescription drugs, or dietary supplements you use. Also tell them if you smoke, drink alcohol, or use illegal drugs.  Some items may interact with your medicine. What should I watch for while using this medication? Tell your doctor or healthcare professional if your symptoms do not start to get better or if they get worse. Visit your doctor or health care professional for regular checks on your progress. Your condition will be monitored carefully while you are receiving this medicine. This medicine may increase blood sugar. Ask your healthcare provider if changes in diet or medicines are needed if you have diabetes. You may need blood work done while you are taking this medicine. Women should inform their doctor if they wish to become pregnant or think they might be pregnant. There is a potential for serious side effects to an unborn child. Talk to your health care professional or pharmacist for more information. Do not breast-feed an infant while taking this medicine or for 6 months after stopping it. This medicine has caused ovarian failure in some women. This medicine may interfere with the ability to have a child. Talk with your doctor or health care professional if you are concerned about your fertility. What side effects may I notice from receiving this medication? Side effects that you should report to your doctor or health care professional as soon as possible: allergic reactions like skin rash, itching or hives, swelling of the face, lips, or tongue increased blood pressure severe stomach pain signs and symptoms of hgh blood sugar such as being more thirsty or hungry or having to urinate more than normal. You may also feel very tired or have blurry vision. signs and symptoms of low blood sugar such as feeling anxious; confusion; dizziness; increased hunger; unusually weak or tired; sweating; shakiness; cold; irritable; headache; blurred vision; fast   heartbeat; loss of consciousness unusually slow heartbeat Side effects that usually do not require medical attention (report to your doctor or health care  professional if they continue or are bothersome): constipation diarrhea dizziness headache muscle pain muscle spasms nausea pain, redness, or irritation at site where injected This list may not describe all possible side effects. Call your doctor for medical advice about side effects. You may report side effects to FDA at 1-800-FDA-1088. Where should I keep my medication? This drug is given in a hospital or clinic and will not be stored at home. NOTE: This sheet is a summary. It may not cover all possible information. If you have questions about this medicine, talk to your doctor, pharmacist, or health care provider.  2022 Elsevier/Gold Standard (2018-04-18 09:13:08)  

## 2021-02-20 NOTE — Procedures (Signed)
   Patient Name: Dwayne Sawyer, Dwayne Sawyer Date: 02/14/2021 Gender: Male D.O.B: 04-06-1971 Age (years): 50 Referring Provider: Skeet Latch Height (inches): 54 Interpreting Physician: Fransico Him MD, ABSM Weight (lbs): 262 RPSGT: Carolin Coy BMI: 39 MRN: [id number] Neck Size: 16.50  CLINICAL INFORMATION The patient is referred for a CPAP titration to treat sleep apnea.  SLEEP STUDY TECHNIQUE As per the AASM Manual for the Scoring of Sleep and Associated Events v2.3 (April 2016) with a hypopnea requiring 4% desaturations.  The channels recorded and monitored were frontal, central and occipital EEG, electrooculogram (EOG), submentalis EMG (chin), nasal and oral airflow, thoracic and abdominal wall motion, anterior tibialis EMG, snore microphone, electrocardiogram, and pulse oximetry. Continuous positive airway pressure (CPAP) was initiated at the beginning of the study and titrated to treat sleep-disordered breathing.  MEDICATIONS Medications self-administered by patient taken the night of the study : CARVEDILOL, ATORVASTATIN, ESTROGEN  TECHNICIAN COMMENTS Comments added by technician: PATIENT WAS ORDERED AS A CPAP TITRATION. Comments added by scorer: N/A  RESPIRATORY PARAMETERS Optimal PAP Pressure (cm): 16  AHI at Optimal Pressure (/hr):0 Overall Minimal O2 (%):84.0  Supine % at Optimal Pressure (%): 100 Minimal O2 at Optimal Pressure (%): 92.0   SLEEP ARCHITECTURE The study was initiated at 10:55:32 PM and ended at 5:01:41 AM.  Sleep onset time was 5.8 minutes and the sleep efficiency was 92.3%. The total sleep time was 337.9 minutes.  The patient spent 8.4%% of the night in stage N1 sleep, 72.9% in stage N2 sleep, 0.0% in stage N3 and 18.6% in REM.Stage REM latency was 92.5 minutes  Wake after sleep onset was 22.5. Alpha intrusion was absent. Supine sleep was 100.00%.  CARDIAC DATA The 2 lead EKG demonstrated sinus rhythm. The mean heart rate was 58.1 beats per  minute. Other EKG findings include: PVCs.  LEG MOVEMENT DATA The total Periodic Limb Movements of Sleep (PLMS) were 0. The PLMS index was 0.0. A PLMS index of <15 is considered normal in adults.  IMPRESSIONS - The optimal PAP pressure was 16 cm of water. - Moderate oxygen desaturations were observed during this titration (min O2 = 84.0%). - The patient snored with soft snoring volume during this titration study. - 2-lead EKG demonstrated: PVCs - Clinically significant periodic limb movements were not noted during this study. Arousals associated with PLMs were rare.  DIAGNOSIS - Obstructive Sleep Apnea (G47.33)  RECOMMENDATIONS - Trial of CPAP therapy on 16 cm H2O with a Medium size Resmed Full Face Mask AirFit F30 mask and heated humidification. - Avoid alcohol, sedatives and other CNS depressants that may worsen sleep apnea and disrupt normal sleep architecture. - Sleep hygiene should be reviewed to assess factors that may improve sleep quality. - Weight management and regular exercise should be initiated or continued. - Return to [facility] Center for re-evaluation after 6 weeks of therapy  [Electronically signed] 02/20/2021 08:58 PM  Fransico Him MD, ABSM Diplomate, American Board of Sleep Medicine

## 2021-03-05 ENCOUNTER — Other Ambulatory Visit: Payer: Self-pay | Admitting: Family Medicine

## 2021-03-05 DIAGNOSIS — R7303 Prediabetes: Secondary | ICD-10-CM

## 2021-03-07 NOTE — Telephone Encounter (Signed)
Chart supports Rx Last seen 12/10/20 Next appt 03/14/21

## 2021-03-09 ENCOUNTER — Telehealth: Payer: Self-pay | Admitting: *Deleted

## 2021-03-09 DIAGNOSIS — G4733 Obstructive sleep apnea (adult) (pediatric): Secondary | ICD-10-CM

## 2021-03-09 NOTE — Telephone Encounter (Signed)
The patient has been notified of the result and verbalized understanding.  All questions (if any) were answered. Dwayne Sawyer, [place] 03/09/2021 11:18 AM    Upon patient request DME selection is [facility] Patient understands he will be contacted by [facility] to set up his cpap. Patient understands to call if [facility] does not contact him with new setup in a timely manner. Patient understands they will be called once confirmation has been received from [facility] that they have received their new machine to schedule 10 week follow up appointment.   [place] notified of new cpap order  Please add to airview Patient was grateful for the call and thanked me

## 2021-03-09 NOTE — Telephone Encounter (Signed)
Dwayne Margarita, MD  Freada Bergeron, CMA Please let patient know that they had a successful PAP titration and let DME know that orders are in [facility].  Please set up 6 week OV with me.

## 2021-03-09 NOTE — Telephone Encounter (Signed)
-----   Message from Sueanne Margarita, MD sent at 02/20/2021  9:02 PM EDT ----- Please let patient know that they had a successful PAP titration and let DME know that orders are in [facility].  Please set up 6 week OV with me.

## 2021-03-09 NOTE — Telephone Encounter (Addendum)
The patient has been notified of the result and verbalized understanding.  All questions (if any) were answered. Dwayne Sawyer, [place] 03/09/2021 4:04 PM    Upon patient request DME selection is [facility] Patient understands he will be contacted by [facility] to set up his cpap. Patient understands to call if [facility] does not contact him with new setup in a timely manner. Patient understands they will be called once confirmation has been received from [facility] that they have received their new machine to schedule 10 week follow up appointment.   [place] notified of new cpap order  Please add to airview Patient was grateful for the call and thanked me

## 2021-03-14 ENCOUNTER — Other Ambulatory Visit (HOSPITAL_COMMUNITY): Payer: Self-pay | Admitting: Hematology

## 2021-03-14 ENCOUNTER — Encounter: Payer: Self-pay | Admitting: Family Medicine

## 2021-03-14 ENCOUNTER — Ambulatory Visit (INDEPENDENT_AMBULATORY_CARE_PROVIDER_SITE_OTHER): Payer: 59 | Admitting: Family Medicine

## 2021-03-14 ENCOUNTER — Other Ambulatory Visit: Payer: Self-pay

## 2021-03-14 VITALS — BP 140/96 | HR 79 | Temp 97.8°F | Ht 69.0 in | Wt 258.0 lb

## 2021-03-14 DIAGNOSIS — D3A011 Benign carcinoid tumor of the jejunum: Secondary | ICD-10-CM | POA: Diagnosis not present

## 2021-03-14 DIAGNOSIS — D3A00 Benign carcinoid tumor of unspecified site: Secondary | ICD-10-CM

## 2021-03-14 DIAGNOSIS — E785 Hyperlipidemia, unspecified: Secondary | ICD-10-CM | POA: Diagnosis not present

## 2021-03-14 DIAGNOSIS — R7303 Prediabetes: Secondary | ICD-10-CM

## 2021-03-14 DIAGNOSIS — G4733 Obstructive sleep apnea (adult) (pediatric): Secondary | ICD-10-CM | POA: Diagnosis not present

## 2021-03-14 DIAGNOSIS — I10 Essential (primary) hypertension: Secondary | ICD-10-CM | POA: Diagnosis not present

## 2021-03-14 MED ORDER — VALSARTAN 320 MG PO TABS: 320.0000 mg | ORAL_TABLET | Freq: Every day | ORAL | 3 refills | Status: DC

## 2021-03-14 MED ORDER — CHLORTHALIDONE 25 MG PO TABS: 25.0000 mg | ORAL_TABLET | Freq: Every day | ORAL | 3 refills | Status: DC

## 2021-03-14 MED ORDER — AMLODIPINE BESYLATE 10 MG PO TABS: 10.0000 mg | ORAL_TABLET | Freq: Every day | ORAL | 1 refills | Status: DC

## 2021-03-14 NOTE — Written Directive (Addendum)
LUTATHERA THERAPY   RADIOPHARMACEUTICAL:  Lutetium 177 Dotatate (Lutathera)     PRESCRIBED DOSE FOR ADMINISTRATION: 200 mCi   ROUTE OFADMINISTRATION:  IV   DIAGNOSIS: Carcinoid Tumor    REFERRING PHYSICIAN: Truitt Merle    TREATMENT #: 4   DATE OF LAST LONG LIVED SOMATOSTATIN INJECTION: 02/18/2021   ADDITIONAL PHYSICIAN COMMENTS/NOTES:   AUTHORIZED USER SIGNATURE & TIME STAMP: Rennis Golden, MD   03/14/21    2:06 PM

## 2021-03-14 NOTE — Progress Notes (Signed)
[facility] [address] [name] [address] Dept: [phone] Dept Fax: [phone]  Chronic Care Office Visit  Subjective:    Patient ID: Dwayne Sawyer, adult    DOB: 01-11-1971, 50 y.o..   MRN: [id number]  Chief Complaint  Patient presents with   Follow-up    3 mo f/u HTN    History of Present Illness:  Patient is in today for reassessment of chronic medical issues.  Dwayne Sawyer notes that he is overall doing okay at this point. 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 He is currently receiving Lutathera (Lutetium Lu-177 dototate) and is scheduled for his last infusion on Thursday. He will then have a repeat PET scan in Oct. He notes that his diarrhea has been improved on the infusions, though he still struggles with flushing at times.  Dwayne Sawyer  in [place]. He currently is on estrogen and progesterone. He feels better about himself on these medications.  Dwayne Sawyer  has a history of resistant hypertension. He feels frustrated, as he notes his blood pressure was more normal in the cardiologist office. He is currently managed on amlodipine, carvedilol, and Diovan HCT.   Dwayne Sawyer  is also feeling frustrated related to his OSA. He was fitted and titrated in the sleep lab for a CPAP. However, notes that [facility] is unable currently to provide the CPAP machine, as they are having supply chain issues. He was told it may be 6 months before he can get a machine.  Past Medical History: Patient Active Problem List   Diagnosis Date Noted   Primary osteoarthritis of right knee 12/30/2020   Meniscus, medial, derangement, right 12/30/2020   Obesity (BMI 35.0-39.9 without comorbidity) 12/10/2020   Obstructive sleep apnea syndrome, severe 11/29/2020   Secondary carcinoid tumors of liver ([place]) 09/06/2020   Hyperlipidemia 09/06/2020   Resistant hypertension 09/06/2020    Pre-diabetes 09/06/2020   Family history of carcinoid tumor    Carcinoid tumor of small intestine 06/13/2019   Bowel obstruction ([place]) 06/11/2019   Rheumatoid arthritis involving multiple sites with positive rheumatoid factor ([place]) 10/13/2015   Past Surgical History:  Procedure Laterality Date   HERNIA REPAIR     LAPAROTOMY N/A 06/12/2019   Procedure: EXPLORATORY LAPAROTOMY WITH SMALL BOWEL RESECTION;  Surgeon: Johnathan Hausen, MD;  Location: [facility];  Service: General;  Laterality: N/A;   SHOULDER SURGERY     SMALL INTESTINE SURGERY     Family History  Problem Relation Age of Onset   Cancer Father        widespread carcinoid cancer   Hypertension Mother    Hyperlipidemia Mother    Outpatient Medications Prior to Visit  Medication Sig Dispense Refill   atorvastatin (LIPITOR) 10 MG tablet TAKE ONE TABLET BY MOUTH DAILY AT 6:00 IN THE EVENING 90 tablet 1   carvedilol (COREG) 25 MG tablet Take 1 tablet (25 mg total) by mouth 2 (two) times daily with a meal. 180 tablet 3   estrogens, conjugated, (PREMARIN) 0.3 MG tablet Take 0.3 mg by mouth daily. Take 4 mg in the morning an 2 in evening.     metFORMIN (GLUCOPHAGE) 500 MG tablet TAKE ONE TABLET BY MOUTH EVERY MORNING WITH BREAKFAST 90 tablet 1   ondansetron (ZOFRAN) 8 MG tablet Take 1 tablet (8 mg total) by mouth 2 (two) times daily as needed for nausea or vomiting. 20 tablet 0   progesterone 200 MG SUPP Place 200  mg vaginally at bedtime.     amLODipine (NORVASC) 10 MG tablet Take 1 tablet (10 mg total) by mouth daily. 90 tablet 1   valsartan-hydrochlorothiazide (DIOVAN HCT) 320-25 MG tablet Take 1 tablet by mouth daily. 90 tablet 3   No facility-administered medications prior to visit.   No Known Allergies    Objective:   Today's Vitals   03/14/21 0959  BP: (!) 140/96  Pulse: 79  Temp: 97.8 F (36.6 C)  TempSrc: Temporal  SpO2: 96%  Weight: 258 lb (117 kg)  Height: '5\' 9"'$  (1.753 m)   Body mass index is 38.1 kg/m.    General: Well developed, well nourished. No acute distress. Psych: Alert and oriented. Normal mood and affect.  Health Maintenance Due  Topic Date Due   Pneumococcal Vaccine 34-15 Years old (1 - PCV) Never done   Hepatitis C Screening  Never done   Zoster Vaccines- Shingrix (1 of 2) Never done   PAP SMEAR-Modifier  Never done   COLONOSCOPY (Pts 45-4yr Insurance coverage will need to be confirmed)  Never done   MAMMOGRAM  Never done   COVID-19 Vaccine (2 - Janssen risk series) 06/04/2020   INFLUENZA VACCINE  02/21/2021     Assessment & Plan:   1. Resistant hypertension Blood pressure remains above goal. He is currently manage don a CCB, betablocker, ARB and a diuretic. I recommend we switch his therapy to include a chlorthalidone, a more potent diuretic than HCTZ. If this does not get uKoreato goal. We will consider adding spironolactone. I will reassess him in 3 months and plan to check electrolytes at that point.   - amLODipine (NORVASC) 10 MG tablet; Take 1 tablet (10 mg total) by mouth daily.  Dispense: 90 tablet; Refill: 1 - valsartan (DIOVAN) 320 MG tablet; Take 1 tablet (320 mg total) by mouth daily.  Dispense: 90 tablet; Refill: 3 - chlorthalidone (HYGROTON) 25 MG tablet; Take 1 tablet (25 mg total) by mouth daily.  Dispense: 90 tablet; Refill: 3  2. Obstructive sleep apnea syndrome, severe Awaiting CPAP machine.  3. Carcinoid tumor of jejunum, unspecified whether malignant On Lutathera.  4. Hyperlipidemia, unspecified hyperlipidemia type Continue atorvastatin. We will recheck lipids at his next visit.  5. Pre-diabetes Decided to delay a repeat A1c to his next visit. Continue metformin.  SHaydee Salter MD

## 2021-03-17 ENCOUNTER — Other Ambulatory Visit: Payer: Self-pay

## 2021-03-17 ENCOUNTER — Ambulatory Visit (HOSPITAL_COMMUNITY)
Admission: RE | Admit: 2021-03-17 | Discharge: 2021-03-17 | Disposition: A | Discharge status: Home / Self Care | Payer: 59 | Source: Ambulatory Visit | Attending: Hematology | Admitting: Hematology

## 2021-03-17 DIAGNOSIS — D3A00 Benign carcinoid tumor of unspecified site: Secondary | ICD-10-CM | POA: Diagnosis present

## 2021-03-17 LAB — COMPREHENSIVE METABOLIC PANEL
ALT: 22 U/L (ref 0–44)
AST: 19 U/L (ref 15–41)
Albumin: 3.9 g/dL (ref 3.5–5.0)
Alkaline Phosphatase: 55 U/L (ref 38–126)
Anion gap: 10 (ref 5–15)
BUN: 19 mg/dL (ref 6–20)
CO2: 27 mmol/L (ref 22–32)
Calcium: 9 mg/dL (ref 8.9–10.3)
Chloride: 99 mmol/L (ref 98–111)
Creatinine, Ser: 0.94 mg/dL (ref 0.61–1.24)
GFR, Estimated: >60 mL/min (ref >60)
Glucose, Bld: 140 mg/dL — ABNORMAL HIGH (ref 70–99)
Potassium: 3.2 mmol/L — ABNORMAL LOW (ref 3.5–5.1)
Sodium: 136 mmol/L (ref 135–145)
Total Bilirubin: 0.5 mg/dL (ref 0.3–1.2)
Total Protein: 7.1 g/dL (ref 6.5–8.1)

## 2021-03-17 LAB — CBC WITH DIFFERENTIAL/PLATELET
Abs Immature Granulocytes: 0.03 10*3/uL (ref 0.00–0.07)
Basophils Absolute: 0 10*3/uL (ref 0.0–0.1)
Basophils Relative: 1 %
Eosinophils Absolute: 0.2 10*3/uL (ref 0.0–0.5)
Eosinophils Relative: 3 %
HCT: 37.9 % — ABNORMAL LOW (ref 39.0–52.0)
Hemoglobin: 12.5 g/dL — ABNORMAL LOW (ref 13.0–17.0)
Immature Granulocytes: 0 %
Lymphocytes Relative: 10 %
Lymphs Abs: 0.7 10*3/uL (ref 0.7–4.0)
MCH: 29.8 pg (ref 26.0–34.0)
MCHC: 33 g/dL (ref 30.0–36.0)
MCV: 90.5 fL (ref 80.0–100.0)
Monocytes Absolute: 0.7 10*3/uL (ref 0.1–1.0)
Monocytes Relative: 9 %
Neutro Abs: 5.6 10*3/uL (ref 1.7–7.7)
Neutrophils Relative %: 77 %
Platelets: 263 10*3/uL (ref 150–400)
RBC: 4.19 MIL/uL — ABNORMAL LOW (ref 4.22–5.81)
RDW: 13.2 % (ref 11.5–15.5)
WBC: 7.2 10*3/uL (ref 4.0–10.5)
nRBC: 0 % (ref 0.0–0.2)

## 2021-03-17 IMAGING — NM NM [PERSON_NAME] ADMINISTRATION
1 series · 1 of 1 positions shown · non-contrast
Comparison: none

CLINICAL DATA: [age] year-old male with metastatic
neuroendocrine tumor. Well differentiated tumor with somatostatin
receptor is identified within the liver by DOTATATE PET CT scan.

EXAM:
NUCLEAR MEDICINE LUTATHERA ADMINISTRATION
TECHNIQUE: Infusion: The nuclear medicine technologist and I personally
verified the dose activity (207 mCi) to be delivered as specified in
the written directive (200 mCi), and verified the patient
identification via 2 separate methods. 20 gauge IV were started in
the antecubital veins. Anti-emetics were administered by nursing
staff. NAVTI acid renal protection was initiated 30 minutes prior to
Lu 177 DOTATATE (Lutathera) infusion and continued continuously for
4 hours. Lutathera infusion was administered over 30 minutes.

[Series 1: static · 2.07mm/px · 1 of 1 slices shown]
[im 1/1]
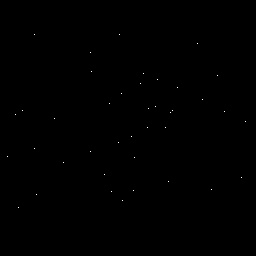

[1 of 1 positions shown; findings below may reference images not displayed]

The total administered dose was 204.5 mCi Lu 177 DOTATATE.

The entire IV tubing, venocatheter, stopcock and syringes was
removed in total, placed in a disposal bag and sent for assay of the
residual activity, which will be reported at a later time in our EMR
by the physics staff. Pressure was applied to the venipuncture
sites, and a compression bandage placed. Radiation Safety personnel
were present to perform the discharge survey, as detailed on their
documentation.

Patient received 120 mg IM long-acting lanreotide injection 4 hours
after Lutathera effusion in the nuclear medicine department.

RADIOPHARMACEUTICALS:  204.5 mCi Lu 177 DOTATATE
FINDINGS: Diagnosis: Metastatic neuroendocrine tumor.

Current Infusion: 4

Planned Infusions: 4

Patient reports minimal interval symptoms following therapy. Patient
reports mild diarrhea and flushing improved with Sandostatin
injections. The patient's most recent blood counts were reviewed and
remains a good candidate to proceed with Lutathera. The patient was
situated in an infusion suite and administered Lutathera as above.
Patient will follow-up with referring oncologist for interval serum
laboratories (CBC and CMP) in approximately 4 weeks.

Patient received 120 mg IM long-acting lanreotide injection 4 hours
after Lutathera effusion in the nuclear medicine department.
IMPRESSION: Fourth and final Lu 177 DOTATATE treatment for metastatic
neuroendocrine tumor. The patient tolerated the infusion well. The
patient will return in approximately 4 to 6 weeks for follow-up
consultation and DOTATATE PET scan.

## 2021-03-17 MED ORDER — PROCHLORPERAZINE EDISYLATE 10 MG/2ML IJ SOLN: 10.0000 mg | Freq: Four times a day (QID) | INTRAMUSCULAR | Status: DC | PRN

## 2021-03-17 MED ORDER — OCTREOTIDE ACETATE 30 MG IM KIT: 30.0000 mg | PACK | Freq: Once | INTRAMUSCULAR | Status: DC

## 2021-03-17 MED ORDER — AMINO ACID RADIOPROTECTANT - L-LYSINE 2.5%/L-ARGININE 2.5% IN NS
250.0000 mL/h | INTRAVENOUS | Status: AC
  Administered 2021-03-17: 250 mL/h via INTRAVENOUS
  Filled 2021-03-17: qty 1000

## 2021-03-17 MED ORDER — SODIUM CHLORIDE 0.9 % IV SOLN
8.0000 mg | Freq: Once | INTRAVENOUS | Status: AC
  Administered 2021-03-17: 8 mg via INTRAVENOUS
  Filled 2021-03-17: qty 4

## 2021-03-17 MED ORDER — LANREOTIDE ACETATE 120 MG/0.5ML ~~LOC~~ SOLN
SUBCUTANEOUS | Status: AC
  Administered 2021-03-17: 120 mg via SUBCUTANEOUS
  Filled 2021-03-17: qty 120

## 2021-03-17 MED ORDER — OCTREOTIDE ACETATE 500 MCG/ML IJ SOLN: 500.0000 ug | Freq: Once | INTRAMUSCULAR | Status: DC | PRN

## 2021-03-17 MED ORDER — LANREOTIDE ACETATE 120 MG/0.5ML ~~LOC~~ SOLN: 120.0000 mg | Freq: Once | SUBCUTANEOUS | Status: AC

## 2021-03-17 MED ORDER — ONDANSETRON HCL 8 MG PO TABS: 8.0000 mg | ORAL_TABLET | Freq: Two times a day (BID) | ORAL | 0 refills | Status: DC | PRN

## 2021-03-17 MED ORDER — PROCHLORPERAZINE EDISYLATE 10 MG/2ML IJ SOLN
INTRAMUSCULAR | Status: AC
  Filled 2021-03-17: qty 2

## 2021-03-17 MED ORDER — OCTREOTIDE ACETATE 500 MCG/ML IJ SOLN
INTRAMUSCULAR | Status: AC
  Filled 2021-03-17: qty 1

## 2021-03-17 MED ORDER — LUTETIUM LU 177 DOTATATE 370 MBQ/ML IV SOLN
200.0000 | Freq: Once | INTRAVENOUS | Status: AC
  Administered 2021-03-17: 200 via INTRAVENOUS

## 2021-03-17 MED ORDER — SODIUM CHLORIDE 0.9 % IV SOLN: 500.0000 mL | Freq: Once | INTRAVENOUS | Status: DC

## 2021-03-17 MED ORDER — OCTREOTIDE ACETATE 30 MG IM KIT
PACK | INTRAMUSCULAR | Status: AC
  Filled 2021-03-17: qty 1

## 2021-03-17 NOTE — Progress Notes (Signed)
Meal tray delivered to patient.  Patient alert and oriented and independent in activities.

## 2021-03-17 NOTE — Progress Notes (Signed)
CLINICAL DATA: [[age]] year-old [male] with metastatic neuroendocrine tumor. Well differentiated tumor with somatostatin receptor is identified within the [liver] by DOTATATE PET CT scan. EXAM: NUCLEAR MEDICINE LUTATHERA ADMINISTRATION TECHNIQUE: Infusion: The nuclear medicine technologist and I personally verified the dose activity ([207] mCi) to be delivered as specified in the written directive (200 mCi), and verified the patient identification via 2 separate methods.  20 gauge IV were started in the antecubital veins. Anti-emetics were administered by nursing staff. Amino acid renal protection was initiated 30 minutes prior to Lu 177 DOTATATE (Lutathera) infusion and continued continuously for 4 hours. Lutathera infusion was administered over 30 minutes.     The total administered dose was [204.5] mCi Lu 177 DOTATATE.   The entire IV tubing, venocatheter, stopcock and syringes was removed in total, placed in a disposal bag and sent for assay of the residual activity, which will be reported at a later time in our EMR by the physics staff. Pressure was applied to the venipuncture sites, and a compression bandage placed. Radiation Safety personnel were present to perform the discharge survey, as detailed on their documentation.   Patient received 120 mg IM long-acting lanreotide injection 4 hours after Lutathera effusion in the nuclear medicine department.  RADIOPHARMACEUTICALS:   [204.5] mCi Lu 177 DOTATATE  FINDINGS: Diagnosis: [Metastatic neuroendocrine tumor.]   Current Infusion: [4]   Planned Infusions: [4]   Patient reports [minimal] interval symptoms following therapy.  Patient reports mild diarrhea and flushing improved with Sandostatin injections.  The patient's most recent blood counts were reviewed and remains a good candidate to proceed with Lutathera. The patient was situated in an infusion suite and administered Lutathera as above. Patient will follow-up with referring  oncologist for interval serum laboratories (CBC and CMP) in approximately 4 weeks.      Patient received 120 mg IM long-acting lanreotide injection 4 hours after Lutathera effusion in the nuclear medicine department.    IMPRESSION: [Fourth and final] [id number] treatment for metastatic neuroendocrine tumor. The patient tolerated the infusion well. The patient will return in approximately 4 to 6 weeks for follow-up consultation and DOTATATE PET scan.

## 2021-03-23 ENCOUNTER — Other Ambulatory Visit: Payer: Self-pay

## 2021-03-23 ENCOUNTER — Ambulatory Visit (INDEPENDENT_AMBULATORY_CARE_PROVIDER_SITE_OTHER): Payer: 59 | Admitting: Cardiovascular Disease

## 2021-03-23 ENCOUNTER — Encounter (HOSPITAL_BASED_OUTPATIENT_CLINIC_OR_DEPARTMENT_OTHER): Payer: Self-pay | Admitting: Cardiovascular Disease

## 2021-03-23 DIAGNOSIS — G4733 Obstructive sleep apnea (adult) (pediatric): Secondary | ICD-10-CM

## 2021-03-23 DIAGNOSIS — E785 Hyperlipidemia, unspecified: Secondary | ICD-10-CM

## 2021-03-23 DIAGNOSIS — E669 Obesity, unspecified: Secondary | ICD-10-CM

## 2021-03-23 DIAGNOSIS — I10 Essential (primary) hypertension: Secondary | ICD-10-CM | POA: Diagnosis not present

## 2021-03-23 MED ORDER — ATORVASTATIN CALCIUM 40 MG PO TABS: ORAL_TABLET | ORAL | 3 refills | Status: DC

## 2021-03-23 NOTE — Assessment & Plan Note (Addendum)
He was diagnosed with sleep apnea and waiting for a CPAP.

## 2021-03-23 NOTE — Assessment & Plan Note (Signed)
Lipids are not quite at goal.  He is unable to increase his exercise due to his knee.  He is going to increase the atorvastatin to 40.  Given that he is atherosclerosis of the aorta LDL should be less than 70.  Triglycerides are also elevated.  Repeat lipids and a CMP in 3 months.

## 2021-03-23 NOTE — Progress Notes (Signed)
Advanced Hypertension Clinic Follow Up:    Date:  03/23/2021   ID:  Dwayne Sawyer, DOB 12-17-1970, MRN [id number]  PCP:  [name], MD  Cardiologist:  Skeet Latch, MD  Nephrologist:  Referring MD: [name], MD   CC: Hypertension  History of Present Illness:    Dwayne Sawyer is a 50 y.o. adult with a hx of carcinoid tumor, hypertension, hyperlipidemia, OSA, atherosclerosis of the aorta, and RA here for follow up.  He first established care in the hypertension clinic 09/2020.  He was first diagnosed with hypertension 15 or 20 years prior.  Initially his blood pressure was fairly easy to control.  However in the last few years it has been more difficult.  He thinks that this started after he was diagnosed with carcinoid syndrome.  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  He will underwent resection and was treated with lanreotide.  He continues to take this monthly to control his symptoms, though it did not stop tumor progression.  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  He has not noted any association with the timing of his next treatment and his blood pressure.  It was thought that his treatment medications were carcinoid may be contributing to the difficult to control blood pressure.  TSH was checked and within normal limits.  He also had a sleep study and was started on a CPAP machine.  Renal artery Dopplers were unremarkable.  He tried stopping his estrogen/progesterone but there is no difference in his blood pressure so he restarted it.  Lutathera was on hold due to manufacturing issues so he was unable to take this medication his blood pressure remained uncontrolled.  He was started on carvedilol.  He followed up with our pharmacist and his blood pressure was 138/84.  Carvedilol was increased to 25 mg.  He last saw Sande Rives, [place] on 11/2020.  At that visit his blood pressure was  142/86.  Valsartan/HCTZ was increased.  Since his last appointment he has been feeling well.  He has no chest pain.  He gets short of breath going up stairs, but attributed that to being overweight.  He is unable to exercise much as he would like due to pain in his right knee.  He thinks he is going to need to have it replaced.  In general his blood pressures been running in the 120s to low 130s over 80s.  He was diagnosed with sleep apnea since his last appointment.  He is waiting for a CPAP machine and likely will be able to get one until at least January.  He has not had any lower extremity edema, orthopnea, or PND.  Overall he is well and without complaint.  Previous antihypertensives: N/A   Past Medical History:  Diagnosis Date   Arthritis    Cancer ([place])    Family history of carcinoid tumor    High cholesterol    Hypertension     Past Surgical History:  Procedure Laterality Date   HERNIA REPAIR     LAPAROTOMY N/A 06/12/2019   Procedure: EXPLORATORY LAPAROTOMY WITH SMALL BOWEL RESECTION;  Surgeon: Johnathan Hausen, MD;  Location: [facility];  Service: General;  Laterality: N/A;   SHOULDER SURGERY     SMALL INTESTINE SURGERY      Current Medications: Current Meds  Medication Sig   amLODipine (NORVASC) 10 MG tablet Take 1 tablet (10 mg total) by mouth  daily.   carvedilol (COREG) 25 MG tablet Take 1 tablet (25 mg total) by mouth 2 (two) times daily with a meal.   chlorthalidone (HYGROTON) 25 MG tablet Take 1 tablet (25 mg total) by mouth daily.   estrogens, conjugated, (PREMARIN) 0.3 MG tablet Take 0.3 mg by mouth daily. Take 4 mg in the morning an 2 in evening.   metFORMIN (GLUCOPHAGE) 500 MG tablet TAKE ONE TABLET BY MOUTH EVERY MORNING WITH BREAKFAST   ondansetron (ZOFRAN) 8 MG tablet Take 1 tablet (8 mg total) by mouth 2 (two) times daily as needed for nausea or vomiting.   progesterone 200 MG SUPP Place 200 mg vaginally at bedtime.   valsartan (DIOVAN) 320 MG tablet Take 1  tablet (320 mg total) by mouth daily.   [DISCONTINUED] atorvastatin (LIPITOR) 10 MG tablet TAKE ONE TABLET BY MOUTH DAILY AT 6:00 IN THE EVENING     Allergies:   Patient has no known allergies.   Social History   Socioeconomic History   Marital status: Married    Spouse name: Not on file   Number of children: Not on file   Years of education: Not on file   Highest education level: Not on file  Occupational History   Not on file  Tobacco Use   Smoking status: Never   Smokeless tobacco: Former    Types: Nurse, children's Use: Never used  Substance and Sexual Activity   Alcohol use: Yes    Comment: occ   Drug use: Never   Sexual activity: Not on file  Other Topics Concern   Not on file  Social History Narrative   Not on file   Social Determinants of Health   Financial Resource Strain: Low Risk    Difficulty of Paying Living Expenses: Not very hard  Food Insecurity: No Food Insecurity   Worried About Running Out of Food in the Last Year: Never true   [place] in the Last Year: Never true  Transportation Needs: No Transportation Needs   Lack of Transportation (Medical): No   Lack of Transportation (Non-Medical): No  Physical Activity: Sufficiently Active   Days of Exercise per Week: 5 days   Minutes of Exercise per Session: 60 min  Stress: No Stress Concern Present   Feeling of Stress : Only a little  Social Connections: Not on file     Family History: The patient's family history includes Cancer in [place]'s father; Hyperlipidemia in [employer] [place]'s mother; Hypertension in [place] Reeder's mother.  ROS:   Please see the history of present illness.    All other systems reviewed and are negative.  EKGs/Labs/Other Studies Reviewed:    EKG:  EKG is not ordered today.  The ekg ordered 10/11/2020 demonstrates sinus rhythm.  Rate 69 bpm.  Recent Labs: 10/11/2020: TSH 1.960 03/17/2021: ALT 22; BUN 19; Creatinine, Ser 0.94; Hemoglobin 12.5; Platelets  263; Potassium 3.2; Sodium 136   Recent Lipid Panel    Component Value Date/Time   CHOL 172 09/06/2020 1046   TRIG 244.0 (H) 09/06/2020 1046   HDL 49.80 09/06/2020 1046   CHOLHDL 3 09/06/2020 1046   VLDL 48.8 (H) 09/06/2020 1046   LDLDIRECT 88.0 09/06/2020 1046    Physical Exam:   VS:  BP 128/82   Pulse 80   Ht '5\' 9"'$  (1.753 m)   Wt 255 lb 3.2 oz (115.8 kg)   SpO2 94%   BMI 37.69 kg/m  , BMI Body mass index  is 37.69 kg/m. GENERAL:  Well appearing HEENT: Pupils equal round and reactive, fundi not visualized, oral mucosa unremarkable NECK:  No jugular venous distention, waveform within normal limits, carotid upstroke brisk and symmetric, no bruits, no thyromegaly LYMPHATICS:  No cervical adenopathy LUNGS:  Clear to auscultation bilaterally HEART:  RRR.  PMI not displaced or sustained,S1 and S2 within normal limits, no S3, no S4, no clicks, no rubs, no murmurs ABD:  Flat, positive bowel sounds normal in frequency in pitch, no bruits, no rebound, no guarding, no midline pulsatile mass, no hepatomegaly, no splenomegaly EXT:  2 plus pulses throughout, no edema, no cyanosis no clubbing SKIN:  No rashes no nodules NEURO:  Cranial nerves II through XII grossly intact, motor grossly intact throughout PSYCH:  Cognitively intact, oriented to person place and time   ASSESSMENT:    1. Essential hypertension   2. Hyperlipidemia, unspecified hyperlipidemia type   3. Resistant hypertension   4. Obstructive sleep apnea syndrome, severe   5. Obesity (BMI 35.0-39.9 without comorbidity)      PLAN:    Resistant hypertension Blood pressure is much better controlled.  Continue amlodipine, carvedilol, chlorthalidone, and valsartan.  He will continue trying to exercise as his knee allows.  Obstructive sleep apnea syndrome, severe He was diagnosed with sleep apnea and waiting for a CPAP.  Hyperlipidemia Lipids are not quite at goal.  He is unable to increase his exercise due to his knee.   He is going to increase the atorvastatin to 40.  Given that he is atherosclerosis of the aorta LDL should be less than 70.  Triglycerides are also elevated.  Repeat lipids and a CMP in 3 months.  Obesity (BMI 35.0-39.9 without comorbidity) Continue working on diet and exercise.    Disposition:    FU with MD/PharmD in 1 month    Medication Adjustments/Labs and Tests Ordered: Current medicines are reviewed at length with the patient today.  Concerns regarding medicines are outlined above.  Orders Placed This Encounter  Procedures   Lipid panel   Comprehensive metabolic panel    Meds ordered this encounter  Medications   atorvastatin (LIPITOR) 40 MG tablet    Sig: TAKE ONE TABLET BY MOUTH DAILY AT 6:00 IN THE EVENING    Dispense:  90 tablet    Refill:  3    INCREASED DOSE      Signed, Skeet Latch, MD  03/23/2021 8:21 AM    [facility]

## 2021-03-23 NOTE — Assessment & Plan Note (Signed)
Blood pressure is much better controlled.  Continue amlodipine, carvedilol, chlorthalidone, and valsartan.  He will continue trying to exercise as his knee allows.

## 2021-03-23 NOTE — Patient Instructions (Signed)
Medication Instructions:  INCREASE YOUR ATORVASTATIN TO 40 MG DAILY   *If you need a refill on your cardiac medications before your next appointment, please call your pharmacy*  Lab Work: FASTING LP/CMET IN December   If you have labs (blood work) drawn today and your tests are completely normal, you will receive your results only by: [place] (if you have MyChart) OR A paper copy in the mail If you have any lab test that is abnormal or we need to change your treatment, we will call you to review the results.  Testing/Procedures: NONE  Follow-Up: At [facility], you and your health needs are our priority.  As part of our continuing mission to provide you with exceptional heart care, we have created designated Provider Care Teams.  These Care Teams include your primary Cardiologist (physician) and Advanced Practice Providers (APPs -  Physician Assistants and Nurse Practitioners) who all work together to provide you with the care you need, when you need it.  We recommend signing up for the patient portal called "MyChart".  Sign up information is provided on this After Visit Summary.  MyChart is used to connect with patients for Virtual Visits (Telemedicine).  Patients are able to view lab/test results, encounter notes, upcoming appointments, etc.  Non-urgent messages can be sent to your provider as well.   To learn more about what you can do with MyChart, go to [email or web address].    Your next appointment:   12 month(s)  The format for your next appointment:   In Person  Provider:   Skeet Latch, MD

## 2021-03-23 NOTE — Assessment & Plan Note (Signed)
Continue working on diet and exercise.

## 2021-03-30 NOTE — Progress Notes (Signed)
I, Dwayne Sawyer, LAT, ATC, am serving as scribe for Dr. [name].  Dwayne Sawyer is a 50 y.o. 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  He was last seen by Dr. Georgina Snell on 12/30/20 for f/u and to review his R knee MRI.  Today, pt reports R knee started hurting again about a month ago after tripping on a sprinkler head hidden in pine straw, falling to the ground. Pain has worsened over the last couple of week. Pt notes his L knee has started to hurt due to compensation.  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   Exam:  BP (!) 148/100   Pulse 74   Ht '5\' 9"'$  (1.753 m)   Wt 258 lb 9.6 oz (117.3 kg)   SpO2 98%   BMI 38.19 kg/m  General: Well Developed, well nourished, and in no acute distress.   MSK: Right knee moderate effusion. Normal-appearing otherwise. Normal motion with crepitation. Tender palpation anterior knee. Stable ligamentous exam.    Lab and Radiology Results  Procedure: Real-time Ultrasound Guided Injection of right knee superior lateral patellar space Device: Philips Affiniti 50G Images permanently stored and available for review in PACS Verbal informed consent obtained.  Discussed risks and benefits of procedure. Warned about infection bleeding damage to structures skin hypopigmentation and fat atrophy among others. Patient expresses understanding and agreement Time-out conducted.   Noted no overlying erythema, induration, or other signs of local infection.   Skin prepped in a sterile fashion.   Local anesthesia: Topical Ethyl chloride.   With sterile technique and under real time ultrasound guidance: 40 mg of Kenalog and 2 mL of Marcaine injected into knee joint. Fluid seen entering the joint capsule.    Completed without difficulty   Pain immediately resolved suggesting accurate placement of the medication.   Advised to call if fevers/chills, erythema, induration, drainage, or persistent bleeding.   Images permanently stored and available for review in the ultrasound unit.  Impression: Technically successful ultrasound guided injection.     EXAM: MRI OF THE RIGHT KNEE WITHOUT CONTRAST   TECHNIQUE: Multiplanar, multisequence MR imaging of the knee was performed. No intravenous contrast was administered.   COMPARISON:  None.   FINDINGS: MENISCI   Medial: Degeneration of the medial meniscus. No discrete tear. Peripheral meniscal extrusion.   Lateral: Intact.   LIGAMENTS   Cruciates: ACL and PCL are intact.   Collaterals: Medial collateral ligament is intact. Lateral collateral ligament complex is intact.   CARTILAGE   Patellofemoral: Partial-thickness cartilage loss of the trochlear groove and medial trochlea. Cartilage fissuring of the medial patellar facet.   Medial: Full-thickness cartilage loss of the medial femorotibial compartment with subchondral reactive marrow edema in the medial femoral condyle and medial tibial plateau.   Lateral:  No chondral defect.   JOINT: No joint effusion. Normal Hoffa's fat-pad. No plical thickening.   POPLITEAL FOSSA: Popliteus tendon is intact. Tiny Baker's cyst.   EXTENSOR MECHANISM: Intact quadriceps tendon. Intact patellar tendon. Intact lateral patellar retinaculum. Intact medial patellar retinaculum. Intact MPFL.   BONES: No aggressive osseous lesion. No fracture or dislocation.   Other: No fluid collection or hematoma. Muscles are normal.   IMPRESSION: 1. Full-thickness cartilage loss of the medial femorotibial compartment  with subchondral reactive marrow edema in the medial femoral condyle and medial tibial plateau. 2. Partial-thickness cartilage loss of the trochlear groove and medial trochlea. Cartilage  fissuring of the medial patellar facet.     Electronically Signed   By: Kathreen Devoid   On: 12/26/2020 07:33   I, [name], personally (independently) visualized and performed the interpretation of the images attached in this note.      Assessment and Plan: 50 y.o. adult with right knee pain.  Exacerbation of underlying DJD.  No evidence of severe injury after recent fall.  Plan for repeat steroid injection today.  If needed certainly can proceed to hyaluronic acid injection however this will need to be authorized.  Could repeat steroid injection every 3 months if needed.   PDMP not reviewed this encounter. Orders Placed This Encounter  Procedures   [place] LIMITED JOINT SPACE STRUCTURES LOW RIGHT(NO LINKED CHARGES)    Order Specific Question:   Reason for Exam (SYMPTOM  OR DIAGNOSIS REQUIRED)    Answer:   R knee pain    Order Specific Question:   Preferred imaging location?    Answer:   [facility]   No orders of the defined types were placed in this encounter.    Discussed warning signs or symptoms. Please see discharge instructions. Patient expresses understanding.   The above documentation has been reviewed and is accurate and complete [name], M.D.

## 2021-03-31 ENCOUNTER — Ambulatory Visit: Payer: Self-pay

## 2021-03-31 ENCOUNTER — Other Ambulatory Visit: Payer: Self-pay

## 2021-03-31 ENCOUNTER — Ambulatory Visit (INDEPENDENT_AMBULATORY_CARE_PROVIDER_SITE_OTHER): Payer: 59 | Admitting: Family Medicine

## 2021-03-31 VITALS — BP 148/100 | HR 74 | Ht 69.0 in | Wt 258.6 lb

## 2021-03-31 DIAGNOSIS — M25561 Pain in right knee: Secondary | ICD-10-CM

## 2021-03-31 DIAGNOSIS — M1711 Unilateral primary osteoarthritis, right knee: Secondary | ICD-10-CM | POA: Diagnosis not present

## 2021-03-31 NOTE — Patient Instructions (Signed)
Thank you for coming in today.   Call or go to the ER if you develop a large red swollen joint with extreme pain or oozing puss.    I can do gel shots if needed. I need a little warning.   Let me know how you do .   We can do the cortisone shots every 3 months.

## 2021-04-07 ENCOUNTER — Telehealth: Payer: 59 | Admitting: Emergency Medicine

## 2021-04-07 DIAGNOSIS — R0981 Nasal congestion: Secondary | ICD-10-CM

## 2021-04-07 MED ORDER — FLUTICASONE PROPIONATE 50 MCG/ACT NA SUSP: 2.0000 | Freq: Every day | NASAL | 0 refills | Status: DC

## 2021-04-07 NOTE — Progress Notes (Signed)
E-Visit for Sinus Problems  We are sorry that you are not feeling well.  Here is how we plan to help!  Based on what you have shared with me it looks like you have sinusitis.  Sinusitis is inflammation and infection in the sinus cavities of the head.  Based on your presentation I believe you most likely have Acute Viral Sinusitis.This is an infection most likely caused by a virus. There is not specific treatment for viral sinusitis other than to help you with the symptoms until the infection runs its course.  You may use an oral decongestant such as Mucinex D or if you have glaucoma or high blood pressure use plain Mucinex. Saline nasal spray help and can safely be used as often as needed for congestion, I have prescribed: Fluticasone nasal spray two sprays in each nostril once a day  Some authorities believe that zinc sprays or the use of Echinacea may shorten the course of your symptoms.  Sinus infections are not as easily transmitted as other respiratory infection, however we still recommend that you avoid close contact with loved ones, especially the very young and elderly.  Remember to wash your hands thoroughly throughout the day as this is the number one way to prevent the spread of infection!  Home Care: Only take medications as instructed by your medical team. Do not take these medications with alcohol. A steam or ultrasonic humidifier can help congestion.  You can place a towel over your head and breathe in the steam from hot water coming from a faucet. Avoid close contacts especially the very young and the elderly. Cover your mouth when you cough or sneeze. Always remember to wash your hands.  Get Help Right Away If: You develop worsening fever or sinus pain. You develop a severe head ache or visual changes. Your symptoms persist after you have completed your treatment plan.  Make sure you Understand these instructions. Will watch your condition. Will get help right away if you  are not doing well or get worse.   Thank you for choosing an e-visit.  Your e-visit answers were reviewed by a board certified advanced clinical practitioner to complete your personal care plan. Depending upon the condition, your plan could have included both over the counter or prescription medications.  Please review your pharmacy choice. Make sure the pharmacy is open so you can pick up prescription now. If there is a problem, you may contact your provider through [employer] and have the prescription routed to another pharmacy.  Your safety is important to [place]. If you have drug allergies check your prescription carefully.   For the next 24 hours you can use MyChart to ask questions about today's visit, request a non-urgent call back, or ask for a work or school excuse. You will get an email in the next two days asking about your experience. I hope that your e-visit has been valuable and will speed your recovery.  Approximately 5 minutes was used in reviewing the patient's chart, questionnaire, prescribing medications, and documentation.

## 2021-04-15 ENCOUNTER — Encounter: Payer: Self-pay | Admitting: Oncology

## 2021-04-15 ENCOUNTER — Inpatient Hospital Stay: Payer: 59 | Attending: Nurse Practitioner

## 2021-04-15 ENCOUNTER — Other Ambulatory Visit: Payer: Self-pay

## 2021-04-15 VITALS — BP 147/96 | HR 82 | Temp 98.2°F | Resp 18

## 2021-04-15 DIAGNOSIS — C7B02 Secondary carcinoid tumors of liver: Secondary | ICD-10-CM | POA: Diagnosis not present

## 2021-04-15 DIAGNOSIS — D3A011 Benign carcinoid tumor of the jejunum: Secondary | ICD-10-CM

## 2021-04-15 DIAGNOSIS — C7A011 Malignant carcinoid tumor of the jejunum: Secondary | ICD-10-CM | POA: Insufficient documentation

## 2021-04-15 DIAGNOSIS — R197 Diarrhea, unspecified: Secondary | ICD-10-CM | POA: Insufficient documentation

## 2021-04-15 MED ORDER — LANREOTIDE ACETATE 120 MG/0.5ML ~~LOC~~ SOLN
120.0000 mg | Freq: Once | SUBCUTANEOUS | Status: AC
Start: 2021-04-15 — End: 2021-04-15
  Administered 2021-04-15: 120 mg via SUBCUTANEOUS
  Filled 2021-04-15: qty 120

## 2021-04-15 NOTE — Patient Instructions (Signed)
Lanreotide injection What is this medication? LANREOTIDE (lan REE oh tide) is used to reduce blood levels of growth hormone in patients with a condition called acromegaly. It also works to slow or stop tumor growth in patients with neuroendocrine tumors and treat carcinoid syndrome. This medicine may be used for other purposes; ask your health care provider or pharmacist if you have questions. COMMON BRAND NAME(S): Somatuline [place] What should I tell my care team before I take this medication? They need to know if you have any of these conditions: diabetes gallbladder disease heart disease kidney disease liver disease thyroid disease an unusual or allergic reaction to lanreotide, other medicines, foods, dyes, or preservatives pregnant or trying to get pregnant breast-feeding How should I use this medication? This medicine is for injection under the skin. It is given by a health care professional in a hospital or clinic setting. Contact your pediatrician or health care professional regarding the use of this medicine in children. Special care may be needed. Overdosage: If you think you have taken too much of this medicine contact a poison control center or emergency room at once. NOTE: This medicine is only for you. Do not share this medicine with others. What if I miss a dose? It is important not to miss your dose. Call your doctor or health care professional if you are unable to keep an appointment. What may interact with this medication? This medicine may interact with the following medications: bromocriptine cyclosporine certain medicines for blood pressure, heart disease, irregular heart beat certain medicines for diabetes quinidine terfenadine This list may not describe all possible interactions. Give your health care provider a list of all the medicines, herbs, non-prescription drugs, or dietary supplements you use. Also tell them if you smoke, drink alcohol, or use illegal drugs.  Some items may interact with your medicine. What should I watch for while using this medication? Tell your doctor or healthcare professional if your symptoms do not start to get better or if they get worse. Visit your doctor or health care professional for regular checks on your progress. Your condition will be monitored carefully while you are receiving this medicine. This medicine may increase blood sugar. Ask your healthcare provider if changes in diet or medicines are needed if you have diabetes. You may need blood work done while you are taking this medicine. Women should inform their doctor if they wish to become pregnant or think they might be pregnant. There is a potential for serious side effects to an unborn child. Talk to your health care professional or pharmacist for more information. Do not breast-feed an infant while taking this medicine or for 6 months after stopping it. This medicine has caused ovarian failure in some women. This medicine may interfere with the ability to have a child. Talk with your doctor or health care professional if you are concerned about your fertility. What side effects may I notice from receiving this medication? Side effects that you should report to your doctor or health care professional as soon as possible: allergic reactions like skin rash, itching or hives, swelling of the face, lips, or tongue increased blood pressure severe stomach pain signs and symptoms of hgh blood sugar such as being more thirsty or hungry or having to urinate more than normal. You may also feel very tired or have blurry vision. signs and symptoms of low blood sugar such as feeling anxious; confusion; dizziness; increased hunger; unusually weak or tired; sweating; shakiness; cold; irritable; headache; blurred vision; fast   heartbeat; loss of consciousness unusually slow heartbeat Side effects that usually do not require medical attention (report to your doctor or health care  professional if they continue or are bothersome): constipation diarrhea dizziness headache muscle pain muscle spasms nausea pain, redness, or irritation at site where injected This list may not describe all possible side effects. Call your doctor for medical advice about side effects. You may report side effects to FDA at 1-800-FDA-1088. Where should I keep my medication? This drug is given in a hospital or clinic and will not be stored at home. NOTE: This sheet is a summary. It may not cover all possible information. If you have questions about this medicine, talk to your doctor, pharmacist, or health care provider.  2022 Elsevier/Gold Standard (2018-04-18 09:13:08)  

## 2021-04-28 ENCOUNTER — Ambulatory Visit (HOSPITAL_COMMUNITY)
Admission: RE | Admit: 2021-04-28 | Discharge: 2021-04-28 | Disposition: A | Discharge status: Home / Self Care | Payer: 59 | Source: Ambulatory Visit | Attending: Hematology | Admitting: Hematology

## 2021-04-28 ENCOUNTER — Other Ambulatory Visit: Payer: Self-pay

## 2021-04-28 DIAGNOSIS — D3A00 Benign carcinoid tumor of unspecified site: Secondary | ICD-10-CM | POA: Insufficient documentation

## 2021-04-28 IMAGING — NM NM RADIOLOGIST EVAL AND MGMT
1 series · 1 of 1 positions shown · non-contrast
Comparison: none

[Series 1: static · 2.07mm/px · 1 of 1 slices shown]
[im 1/1]
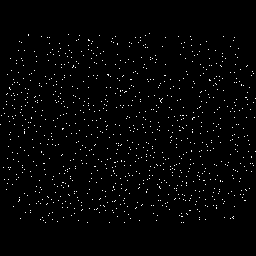

[1 of 1 positions shown; findings below may reference images not displayed]

EXAM:
ESTABLISHED PATIENT OFFICE VISIT

CHIEF COMPLAINT:
Well differentiated neuroendocrine tumor. Patient status post 4
cycles peptide receptor radiotherapy with 200 millicuries per dose
of 2 cm 177 Dotatate. Last treatment [DATE].

Current Pain Level: 1-10

HISTORY OF PRESENT ILLNESS:
See [REDACTED] note

REVIEW OF SYSTEMS:
See [REDACTED] note

PHYSICAL EXAMINATION:
See [REDACTED] note

ASSESSMENT AND PLAN:
1. Patient has completed 4 cycles of peptide receptor radiotherapy
(Lu 177 DOTATATE). Last dose received [DATE]. Patient tolerated
full cycle of treatment well with no evidence of myelosuppression or
renal insufficiency.
2. Follow-up DOTATATE PET scan demonstrate decrease in radiotracer
activity within hepatic metastasis and no evidence disease
progression.
3. Chromogranin A remains stable and decreased from pre therapy
level.
4. Patient continues to have mild diarrhea and flushing which is
controlled by monthly Sandostatin injections.
5. Patient advised if any issues or evidence of disease progression
in the future we are available for consultation.

## 2021-04-28 IMAGING — CT NM PET SKULL BASE TO THIGH
1 of 7 series · 2 of 25 positions shown · non-contrast
Comparison: PET-CT scan [DATE]

CLINICAL DATA: Well differentiated neuroendocrine tumor with liver
metastasis. Patient status post peptide receptor radiotherapy with
final dose [DATE].

EXAM:
NUCLEAR MEDICINE PET SKULL BASE TO THIGH
TECHNIQUE: 5.3 mCi gallium 68 DOTATATE was injected intravenously. Full-ring
PET imaging was performed from the skull base to thigh after the
radiotracer. CT data was obtained and used for attenuation
correction and anatomic localization.

[Series 4: ct sk_thigh 5.0 bf37 · axial · 5.0mm · 0.98mm/px · z∈[-244,-8]mm · 2 of 237 slices shown]
[im 60/237  brain]
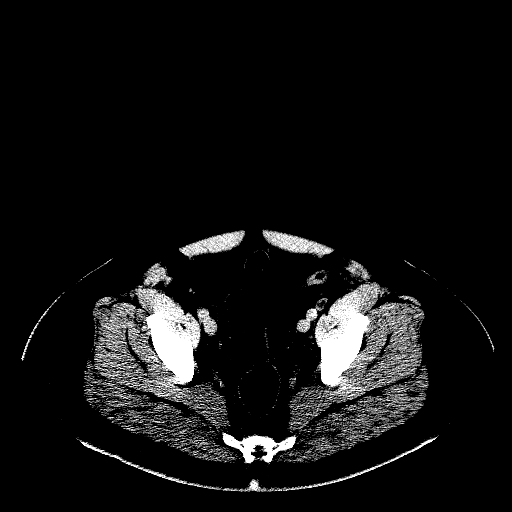
[im 119/237  brain]
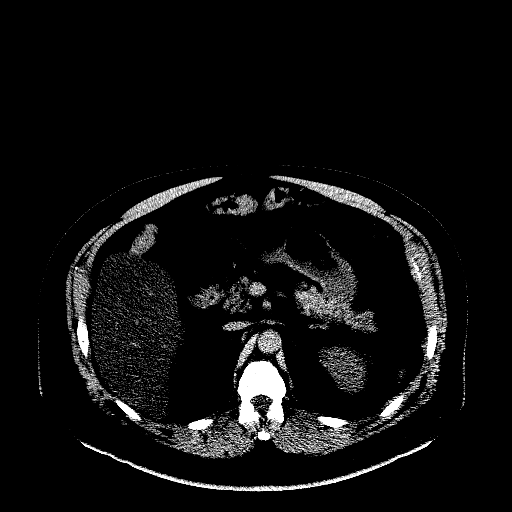

[2 of 25 positions shown; findings below may reference images not displayed]

FINDINGS: NECK

No radiotracer activity in neck lymph nodes.

Incidental CT findings: None

CHEST

No radiotracer accumulation within mediastinal or hilar lymph nodes.
No suspicious pulmonary nodules on the CT scan.

Incidental CT finding:New bilateral symmetric gynecomastia.

ABDOMEN/PELVIS

Again demonstrated diffuse hepatic steatosis which provides contrast
with the hyperdense metastatic lesions in the liver.

Large lesion in the RIGHT hepatic lobe measures 5.4 x 4.2 cm
compared to 5.8 x 4.7 cm. Activity is decreased with SUV max equal
21.6 compared SUV max equal 36.4 (image 99)

Small lesion in the anterior central LEFT hepatic lobe SUV max equal
13. 6 (image 99) compared SUV max equal 19.0.

Lesion in the dome the RIGHT hepatic lobe which measures 13 mm
(image 87/CT series 4) compares to 12 mm with SUV max equal
compared SUV max equal

There several smaller lesions on comparison DOTATATE PET scan along
the subcapsular RIGHT hepatic lobe which are no longer radiotracer
avid on current exam.

On comparison exam, there was a radiotracer avid lesion adjacent the
head of the pancreas. Lesion difficult define on the CT portion exam
but has decreased radiotracer activity with SUV max equal 9.0 (image
117) compared SUV max 19.5.

No radiotracer avid nodules within the mesentery. No abnormal
activity in the small bowel or colon.

Post RIGHT hemicolectomy anatomy.

No new lesions are identified.

Physiologic activity noted in the liver, spleen, adrenal glands and
kidneys.

Incidental CT findings:None

SKELETON

No aggressive osseous lesion.

Incidental CT findings:None
IMPRESSION: 1. Interval decrease in radiotracer activity of neuroendocrine tumor
hepatic metastasis. Dominant lesion the RIGHT hepatic lobe is mildly
in size. Several small peripheral lesions now have no radiotracer
activity. Lesions are conspicuous in liver on noncontrast exam due
to hepatic steatosis.
2. Small lesion in the head of the pancreas ahs decreased
radiotracer activity. Lesion difficult to define on CT portion exam.
3. No evidence new metastatic neuroendocrine tumor.
4. Post RIGHT hemicolectomy anatomy without complication
5. New bilateral symmetric gynecomastia.

## 2021-04-28 MED ORDER — GALLIUM GA 68 DOTATATE IV KIT
5.3000 | PACK | Freq: Once | INTRAVENOUS | Status: AC | PRN
  Administered 2021-04-28: 5.3 via INTRAVENOUS

## 2021-04-28 NOTE — Final Consult Note (Signed)
Chief Complaint: Patient with metastatic neuroendocrine tumor post completion of 4 cycles Peptide receptor radiotherapy (PRRT) with KW409 DOTATATE (Lutathera).  Referring Physician(s):Yeng   Patient Status: [facility] - Out-pt  History of Present Illness: Dwayne Sawyer is a 50 y.o. adult  male who presented on June 11, 2019 with small bowel obstruction identified CT abdomen pelvis exam.  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    Subsequent DOTATATE PET scan 07/16/2019 demonstrated large metastatic well differentiated neuroendocrine tumor within RIGHT hepatic lobe.    Patient was initiated on monthly Sandostatin injection on 08/11/2019.    Subsequent PET-CT scan 05/31/2020 demonstrated progression of well differentiated neuroendocrine tumor with increase in the dominant RIGHT hepatic lobe lesions several smaller RIGHT hepatic lobe lesions.    Patient reports mild loose stools and some mild flushing.  Symptoms not affected by Sandostatin injections.   Patient status post 4 cycles peptide receptor radiotherapy with 735 millicuries per dose of Lu177 Dotatate.  Last treatment 03/17/2021.     Past Medical History:  Diagnosis Date   Arthritis    Cancer ([place])    Family history of carcinoid tumor    High cholesterol    Hypertension     Past Surgical History:  Procedure Laterality Date   HERNIA REPAIR     LAPAROTOMY N/A 06/12/2019   Procedure: EXPLORATORY LAPAROTOMY WITH SMALL BOWEL RESECTION;  Surgeon: Johnathan Hausen, MD;  Location: [facility] ORS;  Service: General;  Laterality: N/A;   SHOULDER SURGERY     SMALL INTESTINE SURGERY      Allergies: Patient has no known allergies.  Medications: Prior to Admission medications   Medication Sig Start Date End Date Taking? Authorizing Provider  amLODipine (NORVASC) 10 MG tablet Take 1 tablet (10 mg total) by mouth daily. 03/14/21   [name], MD  atorvastatin (LIPITOR) 40 MG tablet TAKE ONE TABLET BY MOUTH DAILY AT 6:00 IN THE EVENING 03/23/21   Skeet Latch, MD  carvedilol (COREG) 25 MG tablet Take 1 tablet (25 mg total) by mouth 2 (two) times daily with a meal. 01/10/21   Skeet Latch, MD  chlorthalidone (HYGROTON) 25 MG tablet Take 1 tablet (25 mg total) by mouth daily. 03/14/21   [name], MD  estrogens, conjugated, (PREMARIN) 0.3 MG tablet Take 0.3 mg by mouth daily. Take 4 mg in the morning an 2 in evening.    [provider]  fluticasone (FLONASE) 50 MCG/ACT nasal spray Place 2 sprays into both nostrils daily. 04/07/21   Montine Circle, PA-C  metFORMIN (GLUCOPHAGE) 500 MG tablet TAKE ONE TABLET BY MOUTH EVERY MORNING WITH BREAKFAST 03/07/21   [name], MD  ondansetron (ZOFRAN) 8 MG tablet Take 1 tablet (8 mg total) by mouth 2 (two) times daily as needed for nausea or vomiting. 01/20/21   Gus Height, MD  ondansetron (ZOFRAN) 8 MG tablet Take 1 tablet (8 mg total) by mouth 2 (two) times daily as needed for nausea or vomiting. Patient not taking: Reported on 03/23/2021 03/17/21   Gus Height, MD  progesterone 200 MG SUPP Place 200 mg vaginally at bedtime.    [provider]  valsartan (DIOVAN) 320 MG tablet Take 1 tablet (320 mg total) by mouth daily. 03/14/21   [name], MD     Family History  Problem Relation Age of Onset   Cancer Father        widespread carcinoid cancer   Hypertension  Mother    Hyperlipidemia Mother     Social History   Socioeconomic History   Marital status: Married    Spouse name: Not on file   Number of children: Not on file   Years of education: Not on file   Highest education level: Not on file  Occupational History   Not on file  Tobacco Use   Smoking status: Never   Smokeless tobacco: Former    Types: Nurse, children's Use: Never used  Substance and Sexual Activity   Alcohol use: Yes    Comment: occ   Drug use: Never    Sexual activity: Not on file  Other Topics Concern   Not on file  Social History Narrative   Not on file   Social Determinants of Health   Financial Resource Strain: Low Risk    Difficulty of Paying Living Expenses: Not very hard  Food Insecurity: No Food Insecurity   Worried About Running Out of Food in the Last Year: Never true   [place] in the Last Year: Never true  Transportation Needs: No Transportation Needs   Lack of Transportation (Medical): No   Lack of Transportation (Non-Medical): No  Physical Activity: Sufficiently Active   Days of Exercise per Week: 5 days   Minutes of Exercise per Session: 60 min  Stress: No Stress Concern Present   Feeling of Stress : Only a little  Social Connections: Not on file    ECOG Status: 1 - Symptomatic but completely ambulatory  Review of Systems: A 12 point ROS discussed and pertinent positives are indicated in the HPI above.  All other systems are negative.  Review of Systems  Constitutional: Negative.   Gastrointestinal:  Positive for diarrhea.   Flushing and diarrhea.  Tolerable and controlled with sandostatin IM.  Vital Signs: There were no vitals taken for this visit.  Physical Exam Defferred for covid  Imaging: NM PET (NETSPOT GA 49 DOTATATE) SKULL BASE TO MID THIGH  Result Date: 04/28/2021 CLINICAL DATA:  Well differentiated neuroendocrine tumor with liver metastasis. Patient status post peptide receptor radiotherapy with final dose 03/17/2021. EXAM: NUCLEAR MEDICINE PET SKULL BASE TO THIGH TECHNIQUE: 5.3 mCi gallium 68 DOTATATE was injected intravenously. Full-ring PET imaging was performed from the skull base to thigh after the radiotracer. CT data was obtained and used for attenuation correction and anatomic localization. COMPARISON:  PET-CT scan 05/31/2020 FINDINGS: NECK No radiotracer activity in neck lymph nodes. Incidental CT findings: None CHEST No radiotracer accumulation within mediastinal or hilar lymph  nodes. No suspicious pulmonary nodules on the CT scan. Incidental CT finding:New bilateral symmetric gynecomastia. ABDOMEN/PELVIS Again demonstrated diffuse hepatic steatosis which provides contrast with the hyperdense metastatic lesions in the liver. Large lesion in the RIGHT hepatic lobe measures 5.4 x 4.2 cm compared to 5.8 x 4.7 cm. Activity is decreased with SUV max equal 21.6 compared SUV max equal 36.4 (image 99) Small lesion in the anterior central LEFT hepatic lobe SUV max equal 13. 6 (image 99) compared SUV max equal 19.0. Lesion in the dome the RIGHT hepatic lobe which measures 13 mm (image 87/CT series 4) compares to 12 mm with SUV max equal 11.0 compared SUV max equal 17.0 There several smaller lesions on comparison DOTATATE PET scan along the subcapsular RIGHT hepatic lobe which are no longer radiotracer avid on current exam. On comparison exam, there was a radiotracer avid lesion adjacent the head of the pancreas. Lesion difficult define on the  CT portion exam but has decreased radiotracer activity with SUV max equal 9.0 (image 117) compared SUV max 19.5. No radiotracer avid nodules within the mesentery. No abnormal activity in the small bowel or colon. Post RIGHT hemicolectomy anatomy. No new lesions are identified. Physiologic activity noted in the liver, spleen, adrenal glands and kidneys. Incidental CT findings:None SKELETON No aggressive osseous lesion. Incidental CT findings:None IMPRESSION: 1. Interval decrease in radiotracer activity of neuroendocrine tumor hepatic metastasis. Dominant lesion the RIGHT hepatic lobe is mildly in size. Several small peripheral lesions now have no radiotracer activity. Lesions are conspicuous in liver on noncontrast exam due to hepatic steatosis. 2. Small lesion in the head of the pancreas ahs decreased radiotracer activity. Lesion difficult to define on CT portion exam. 3. No evidence new metastatic neuroendocrine tumor. 4. Post RIGHT hemicolectomy anatomy  without complication 5. New bilateral symmetric gynecomastia. Electronically Signed   By: Suzy Bouchard M.D.   On: 04/28/2021 12:50   NM PET (NETSPOT GA 17 DOTATATE) SKULL BASE TO MID THIGH  Result Date: 05/31/2020 CLINICAL DATA:  Restaging carcinoid tumor. Small-bowel resection 2020. Lanreotide injections ongoing. EXAM: NUCLEAR MEDICINE PET SKULL BASE TO THIGH TECHNIQUE: 5.0 mCi Ga 68/Cu 64 DOTATATE was injected intravenously. Full-ring PET imaging was performed from the skull base to thigh after the radiotracer. CT data was obtained and used for attenuation correction and anatomic localization. COMPARISON:  07/16/2019, DOTATATE PET scan FINDINGS: NECK No radiotracer activity in neck lymph nodes. Incidental CT findings: None CHEST Small 4 mm nodule in the RIGHT lower lobe (image 71) does not have radiotracer activity. No abnormal mediastinal lymph nodes. Mild activity associated with RIGHT axillary lymph nodes may relate to recent COVID vaccination. Incidental CT finding:None ABDOMEN/PELVIS Intense radiotracer activity within the dominant RIGHT hepatic lobe lesion with SUV max equal 36 increased from SUV max equal 21 on DOTATATE PET scan 07/16/2019. The hepatic metastatic lesions are more conspicuous on the noncontrast CT scan as there is increased hepatic steatosis which contrasts with the hyperdense lesions. For example a dominant lesion in the RIGHT hepatic lobe measures 5.8 by 4.6 cm which compares to 4.6 by 3.4 cm on MRI 08/04/2019 for increase in size. Several smaller lesion now evident on the noncontrast CT including 10 mm lesion in the central LEFT hepatic lobe on image 101/series 4, and posterior RIGHT hepatic lobe lesion measuring 12 mm image 88. These lesions also have radiotracer activity which is increased from prior. No clear new lesions are present however again the smaller lesions are more conspicuous on current exam. There is a new focus of radiotracer activity localizes to the head of the  pancreas with SUV max equal 19.5. This lesion is small andless than 1 cm however difficult define on noncontrast CT. Lesion is present on image 115 fused data set. No additional abnormal radiotracer in the abdomen pelvis. Activity within the inguinal nodes is favored reactive. Physiologic activity noted in the liver, spleen, adrenal glands and kidneys. Incidental CT findings:None SKELETON No focal activity to suggest skeletal metastasis. Incidental CT findings:None IMPRESSION: 1. Progression of well differentiated neuroendocrine tumor hepatic metastasis with increase in size and radiotracer activity of dominant lesion in the LEFT hepatic lobe and multiple small lesions as described above. 2. New small focus of radiotracer activity within the head of the pancreas. 3. Increased hepatic steatosis. Electronically Signed   By: Suzy Bouchard M.D.   On: 05/31/2020 12:17    Labs:  CBC: Recent Labs    11/04/20 0908 01/20/21 7322  02/11/21 1447 03/17/21 0835  WBC 9.8 9.5 8.4 7.2  HGB 13.3 13.0 13.6 12.5*  HCT 39.9 39.5 39.9 37.9*  PLT 243 295 250 263    COAGS: No results for input(s): INR, APTT in the last 8760 hours.  BMP: Recent Labs    10/06/20 0825 11/04/20 0908 02/11/21 1447 03/17/21 0835  NA 134* 139 139 136  K 3.7 4.4 3.6 3.2*  CL 99 99 102 99  CO2 27 28 24 27   GLUCOSE 196* 142* 167* 140*  BUN 21* 18 21* 19  CALCIUM 9.1 9.5 9.3 9.0  CREATININE 1.08 1.00 1.03 0.94  GFRNONAA >60 >60 >60 >60    LIVER FUNCTION TESTS: Recent Labs    10/06/20 0825 11/04/20 0908 02/11/21 1447 03/17/21 0835  BILITOT 0.5 0.4 0.3 0.5  AST 19 16 16 19   ALT 21 17 20 22   ALKPHOS 50 58 56 55  PROT 7.4 7.6 7.8 7.1  ALBUMIN 4.0 4.0 4.1 3.9    TUMOR MARKERS: No results for input(s): AFPTM, CEA, CA199, CHROMOGRNA in the last 8760 hours.  Assessment and Plan: Patient has completed 4 cycles of peptide receptor radiotherapy (Lu 177 DOTATATE).  Last dose received 03/17/2025.  Patient tolerated full  cycle of treatment well with no evidence of myelosuppression or renal insufficiency.  Follow-up DOTATATE PET scan demonstrate decrease in radiotracer activity within hepatic metastasis and no evidence disease progression.  Chromogranin A  remains stable and decreased from pre therapy level.   Patient continues to have mild diarrhea and flushing which is controlled by monthly Sandostatin injections.  Patient advised if any issues or evidence of disease progression in the future Molecular Imagind Dept at [facility] is  available for consultation.  ]  .nm Thank you for this interesting consult.  I greatly enjoyed meeting Dwayne Sawyer and look forward to participating in their care.  A copy of this report was sent to the requesting provider on this date.  Electronically Signed: Rennis Golden, MD 04/28/2021, 2:39 PM   I spent a total of    10 Minutes in face to face in clinical consultation, greater than 50% of which was counseling/coordinating care for metastatic neuroendocrine tumor.

## 2021-05-01 ENCOUNTER — Encounter: Payer: Self-pay | Admitting: Oncology

## 2021-05-03 ENCOUNTER — Encounter: Payer: Self-pay | Admitting: Oncology

## 2021-05-03 ENCOUNTER — Encounter: Payer: Self-pay | Admitting: Family Medicine

## 2021-05-06 ENCOUNTER — Other Ambulatory Visit: Payer: Self-pay

## 2021-05-06 ENCOUNTER — Ambulatory Visit: Payer: Self-pay

## 2021-05-06 ENCOUNTER — Ambulatory Visit (INDEPENDENT_AMBULATORY_CARE_PROVIDER_SITE_OTHER): Payer: 59 | Admitting: Family Medicine

## 2021-05-06 DIAGNOSIS — M25561 Pain in right knee: Secondary | ICD-10-CM

## 2021-05-06 DIAGNOSIS — M1711 Unilateral primary osteoarthritis, right knee: Secondary | ICD-10-CM

## 2021-05-06 DIAGNOSIS — G8929 Other chronic pain: Secondary | ICD-10-CM

## 2021-05-06 NOTE — Progress Notes (Signed)
Dwayne Sawyer presents to clinic today for Orthovisc injection right knee 1/3  Procedure: Real-time Ultrasound Guided Injection of right knee superior lateral patellar space Device: Philips Affiniti 50G Images permanently stored and available for review in PACS Verbal informed consent obtained.  Discussed risks and benefits of procedure. Warned about infection bleeding damage to structures skin hypopigmentation and fat atrophy among others. Patient expresses understanding and agreement Time-out conducted.   Noted no overlying erythema, induration, or other signs of local infection.   Skin prepped in a sterile fashion.   Local anesthesia: Topical Ethyl chloride.   With sterile technique and under real time ultrasound guidance: Orthovisc 30 mg injected into knee joint. Fluid seen entering the joint capsule.   Completed without difficulty   Pain immediately resolved suggesting accurate placement of the medication.   Advised to call if fevers/chills, erythema, induration, drainage, or persistent bleeding.   Images permanently stored and available for review in the ultrasound unit.  Impression: Technically successful ultrasound guided injection.  Lot #[id number]  Return 1 week for Orthovisc injection right knee 2/3

## 2021-05-06 NOTE — Patient Instructions (Signed)
Thank you for coming in today.   You received an injection today. Seek immediate medical attention if the joint becomes red, extremely painful, or is oozing fluid.   Please schedule your second gel injection for next week, and the 3rd shot for the following

## 2021-05-13 ENCOUNTER — Ambulatory Visit (INDEPENDENT_AMBULATORY_CARE_PROVIDER_SITE_OTHER): Payer: 59 | Admitting: Family Medicine

## 2021-05-13 ENCOUNTER — Inpatient Hospital Stay: Payer: 59 | Attending: Nurse Practitioner

## 2021-05-13 ENCOUNTER — Inpatient Hospital Stay: Payer: 59

## 2021-05-13 ENCOUNTER — Inpatient Hospital Stay (HOSPITAL_BASED_OUTPATIENT_CLINIC_OR_DEPARTMENT_OTHER): Payer: 59 | Admitting: Hematology

## 2021-05-13 ENCOUNTER — Other Ambulatory Visit: Payer: Self-pay

## 2021-05-13 ENCOUNTER — Ambulatory Visit: Payer: 59

## 2021-05-13 VITALS — BP 143/98 | HR 82 | Temp 99.4°F | Resp 18 | Wt 254.0 lb

## 2021-05-13 DIAGNOSIS — M1711 Unilateral primary osteoarthritis, right knee: Secondary | ICD-10-CM

## 2021-05-13 DIAGNOSIS — C7B02 Secondary carcinoid tumors of liver: Secondary | ICD-10-CM | POA: Insufficient documentation

## 2021-05-13 DIAGNOSIS — D3A011 Benign carcinoid tumor of the jejunum: Secondary | ICD-10-CM | POA: Diagnosis not present

## 2021-05-13 DIAGNOSIS — C7A011 Malignant carcinoid tumor of the jejunum: Secondary | ICD-10-CM | POA: Diagnosis not present

## 2021-05-13 DIAGNOSIS — G8929 Other chronic pain: Secondary | ICD-10-CM | POA: Diagnosis not present

## 2021-05-13 DIAGNOSIS — M25561 Pain in right knee: Secondary | ICD-10-CM

## 2021-05-13 LAB — CMP (CANCER CENTER ONLY)
ALT: 21 U/L (ref 0–44)
AST: 25 U/L (ref 15–41)
Albumin: 4.2 g/dL (ref 3.5–5.0)
Alkaline Phosphatase: 56 U/L (ref 38–126)
Anion gap: 10 (ref 5–15)
BUN: 20 mg/dL (ref 6–20)
CO2: 27 mmol/L (ref 22–32)
Calcium: 9.4 mg/dL (ref 8.9–10.3)
Chloride: 100 mmol/L (ref 98–111)
Creatinine: 0.98 mg/dL (ref 0.61–1.24)
GFR, Estimated: >60 mL/min (ref >60)
Glucose, Bld: 140 mg/dL — ABNORMAL HIGH (ref 70–99)
Potassium: 3.6 mmol/L (ref 3.5–5.1)
Sodium: 137 mmol/L (ref 135–145)
Total Bilirubin: 0.4 mg/dL (ref 0.3–1.2)
Total Protein: 7.7 g/dL (ref 6.5–8.1)

## 2021-05-13 LAB — CBC WITH DIFFERENTIAL (CANCER CENTER ONLY)
Abs Immature Granulocytes: 0.02 10*3/uL (ref 0.00–0.07)
Basophils Absolute: 0 10*3/uL (ref 0.0–0.1)
Basophils Relative: 1 %
Eosinophils Absolute: 0.3 10*3/uL (ref 0.0–0.5)
Eosinophils Relative: 3 %
HCT: 36.9 % — ABNORMAL LOW (ref 39.0–52.0)
Hemoglobin: 12.6 g/dL — ABNORMAL LOW (ref 13.0–17.0)
Immature Granulocytes: 0 %
Lymphocytes Relative: 8 %
Lymphs Abs: 0.7 10*3/uL (ref 0.7–4.0)
MCH: 30.2 pg (ref 26.0–34.0)
MCHC: 34.1 g/dL (ref 30.0–36.0)
MCV: 88.5 fL (ref 80.0–100.0)
Monocytes Absolute: 0.6 10*3/uL (ref 0.1–1.0)
Monocytes Relative: 7 %
Neutro Abs: 7 10*3/uL (ref 1.7–7.7)
Neutrophils Relative %: 81 %
Platelet Count: 260 10*3/uL (ref 150–400)
RBC: 4.17 MIL/uL — ABNORMAL LOW (ref 4.22–5.81)
RDW: 13 % (ref 11.5–15.5)
WBC Count: 8.5 10*3/uL (ref 4.0–10.5)
nRBC: 0 % (ref 0.0–0.2)

## 2021-05-13 MED ORDER — LANREOTIDE ACETATE 120 MG/0.5ML ~~LOC~~ SOLN
120.0000 mg | Freq: Once | SUBCUTANEOUS | Status: AC
  Administered 2021-05-13: 120 mg via SUBCUTANEOUS
  Filled 2021-05-13: qty 120

## 2021-05-13 NOTE — Progress Notes (Signed)
[place]   [phone]) [phone] Fax:(336) [phone]   Clinic Follow up Note   Patient Care Team: [name], MD as PCP - General (Family Medicine) Skeet Latch, MD as PCP - Cardiology (Cardiology) Gregor Hams, MD as Consulting Physician (Sports Medicine)  Date of Service:  05/13/2021  CHIEF COMPLAINT: f/u of neuroendocrine tumor  CURRENT THERAPY:  Lanreotide injection every 4 week, plan to stop after today   ASSESSMENT & PLAN:  Dwayne Sawyer is a 50 y.o. adult with   1. 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 Pathology confirmed well differentiated neuroendocrine tumor, with metastasis to 14 of 32 LNs. Margins clear.  -07/16/19 PET shows two right hepatic lesion which are intensely hypermetabolic consistent with metastatic NET. There is also a smaller less avid left hepatic lesion that is concerning for metastasis which was not seen on CT from 06/11/19. No other evidence of mets -His MRI abdomen from 08/04/19 shows 3 liver lesions (0.8-4.4cm) compatible with neuroendocrine metastatic lesions.  -He was seen by Dr. Barry Dienes and she did not recommend surgery due to the multiple liver mets.  -He was on monthly Lanreotide injections since 08/01/19, but stopped after 05/07/20 due to disease progression as seen on 05/31/20 DOTATATE PET scan, and restarted with Lutathera therapy  -He has received three Lutathera treatments, last on 03/17/21. -restaging DOTATATE PET on 04/28/21 showed positive response to treatment. I personally reviewed and discussed with pt  -he is feeling well overall, will stop Lanreotide injection after today and see how he does. If he has recurrent diarrhea or flushing, will restart  -f/u in 3 months    2. RA -previously on methotrexate and enbrel -not currently on therapy but is receiving Orthovisc injections to  right knee   3. Mild Diarrhea  -Secondary to lanreotide injections  -Dwayne Sawyer has had loose mouse-like stool since surgery and has occasional diarrhea at least 3 times a day. Overall manageable.  -I recommend increased water intake and imodium if needed.    4. Hypertension, Hyperglycemia and CKD -BP not well controlled  -He plans to meet his new PCP next month   5. Transgender and estrogen use -Dwayne Sawyer has started estrogen with Mollie Germany, NP in 10/2020 with goal of transgender transitioning.  -I checked his neuroendocrine ER and PR which were negative, so there is no contraindication for estrogen use. Can continue.     PLAN: -Lanreotide injection today, plan to stop and see how he does  -Follow-up in 3 months   No problem-specific Assessment & Plan notes found for this encounter.   SUMMARY OF ONCOLOGIC HISTORY: Oncology History Overview Note  Cancer Staging No matching staging information was found for the patient.    Carcinoid tumor of small intestine  06/11/2019 Imaging   US Abdomen 06/11/19  IMPRESSION: 1. There is a solid mass in the right lobe of the liver which based on current measurements may have enlarged since the prior study from 2015. Images from the previous MR at that time cannot be retrieved. Given this circumstance, it may be prudent to correlate with pre and serial post-contrast MR or CT of the liver to further evaluate. There is underlying diffuse increase in liver echogenicity, a finding indicative of hepatic steatosis.   2. Multiple loops of fluid-filled bowel. Question a degree of ileus or enteritis.   3. Study otherwise unremarkable. Note that much of  the common bile duct is obscured by gas.    06/11/2019 Imaging   CT AP W Contrast 06/11/19   IMPRESSION: 1. High-grade small bowel obstruction secondary to desmoplastic response in the central right abdominal mesentery centered on a 4 cm calcified irregular/spiculated soft tissue lesion. Abrupt  small bowel transition zone is identified immediately lead adjacent to this mesenteric lesion and multiple adjacent bowel loops are tethered into this region with mesenteric edema/congestion. The loop of small bowel immediately proximal to the transition zone shows mild circumferential wall thickening but no pneumatosis. 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 2. Small lymph nodes in the abnormal right mesentery suggest additional metastatic involvement. 3. Heterogeneous liver parenchyma, likely secondary to geographic fatty deposition. There is a focal 14 mm low-density lesion in the right liver. The patient had an MRI in the [facility] system on 08/09/2013 to evaluate a right liver lesion, but those images are not available. Follow-up MRI without and with contrast recommended to exclude metastatic disease. 4. Tiny sclerotic foci in the T12 vertebral body in both femoral heads are likely benign, but close attention on follow-up recommended. 5.  Aortic Atherosclerois (ICD10-170.0)      06/12/2019 Surgery    EXPLORATORY LAPAROTOMY WITH SMALL BOWEL RESECTION by Dr. 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 Interval resection small bowel and mesenteric mass with no evidence residual mesenteric or small bowel neuroendocrine tumor. 2. Large lesion in RIGHT hepatic lobe with intense  radiotracer activity consistent with well differentiated neuroendocrine tumor metastasis. Smaller lesion in the anterior LEFT hepatic lobe is concerning for second hepatic metastasis. 3. Pulmonary nodule measuring 6 mm in LEFT upper lobe is not have radiotracer activity. Smaller RIGHT upper lobe nodule. Recommend close attention on follow-up.   08/01/2019 - 05/07/2020 Chemotherapy   Lanreotide injection monthly starting 08/01/19. Stopped after 05/07/20 due to disease progression     08/04/2019 Imaging   MRI abdomen  IMPRESSION: 1. The previously noted lesion in the right lobe of the liver has grown slightly compared to 2015. In addition, today's study demonstrates 2 smaller lesions with similar imaging characteristics. Given the activity on the recent PET Dotatate scan, these lesions are all compatible with small neuroendocrine metastatic lesions. 2. Hepatic steatosis.   02/10/2020 Imaging   MRI abdomen  IMPRESSION: 1. The dominant right hepatic lobe mass has mildly increased in size compared to the prior exam. This currently measures 5.4 by 4.5 cm, previously 4.9 by 4.2 cm. 2. Nine additional small T2 hyperintense foci in the liver are stable, and while the small size makes these less specific, there likely small metastatic lesions. Today's exam has the benefit of less motion artifact compared to the 06/03/2020 MRI, and I was able to pick at each of these tiny lesions on the prior exam in retrospect. It is conceivable that 1 or more of these tiny lesions could represent small hemangiomas, although I do not observe classic delayed  enhancement pattern. 3. Diffuse hepatic steatosis.   05/31/2020 PET scan   DOTATATE PET  IMPRESSION: 1. Progression of well differentiated neuroendocrine tumor hepatic metastasis with increase in size and radiotracer activity of dominant lesion in the LEFT hepatic lobe and multiple small lesions as described above. 2. New small focus of radiotracer  activity within the head of the pancreas. 3. Increased hepatic steatosis.      Chemotherapy   Lutathera treatments on 08/11/20, 10/06/20, 12/01/20, 01/26/21      04/28/2021 PET scan   NETSPOT GA 68 DOTATATE  IMPRESSION: 1. Interval decrease in radiotracer activity of neuroendocrine tumor hepatic metastasis. Dominant lesion the RIGHT hepatic lobe is mildly in size. Several small peripheral lesions now have no radiotracer activity. Lesions are conspicuous in liver on noncontrast exam due to hepatic steatosis. 2. Small lesion in the head of the pancreas ahs decreased radiotracer activity. Lesion difficult to define on CT portion exam. 3. No evidence new metastatic neuroendocrine tumor. 4. Post RIGHT hemicolectomy anatomy without complication 5. New bilateral symmetric gynecomastia.      INTERVAL HISTORY:  Dwayne Sawyer is here for a follow up of NET. He was last seen by me on 02/11/21. He presents to the clinic alone.  He is doing well, denies any diarrhea or flushing, no pain or other complaints.   All other systems were reviewed with the patient and are negative.  MEDICAL HISTORY:  Past Medical History:  Diagnosis Date   Arthritis    Cancer ([place])    Family history of carcinoid tumor    High cholesterol    Hypertension     SURGICAL HISTORY: Past Surgical History:  Procedure Laterality Date   HERNIA REPAIR     LAPAROTOMY N/A 06/12/2019   Procedure: EXPLORATORY LAPAROTOMY WITH SMALL BOWEL RESECTION;  Surgeon: Johnathan Hausen, MD;  Location: [facility];  Service: General;  Laterality: N/A;   SHOULDER SURGERY     SMALL INTESTINE SURGERY      I have reviewed the social history and family history with the patient and they are unchanged from previous note.  ALLERGIES:  has No Known Allergies.  MEDICATIONS:  Current Outpatient Medications  Medication Sig Dispense Refill   amLODipine (NORVASC) 10 MG tablet Take 1 tablet (10 mg total) by mouth daily. 90 tablet 1   atorvastatin  (LIPITOR) 40 MG tablet TAKE ONE TABLET BY MOUTH DAILY AT 6:00 IN THE EVENING 90 tablet 3   carvedilol (COREG) 25 MG tablet Take 1 tablet (25 mg total) by mouth 2 (two) times daily with a meal. 180 tablet 3   chlorthalidone (HYGROTON) 25 MG tablet Take 1 tablet (25 mg total) by mouth daily. 90 tablet 3   estrogens, conjugated, (PREMARIN) 0.3 MG tablet Take 0.3 mg by mouth daily. Take 4 mg in the morning an 2 in evening.     fluticasone (FLONASE) 50 MCG/ACT nasal spray Place 2 sprays into both nostrils daily. 9.9 mL 0   metFORMIN (GLUCOPHAGE) 500 MG tablet TAKE ONE TABLET BY MOUTH EVERY MORNING WITH BREAKFAST 90 tablet 1   ondansetron (ZOFRAN) 8 MG tablet Take 1 tablet (8 mg total) by mouth 2 (two) times daily as needed for nausea or vomiting. 20 tablet 0   ondansetron (ZOFRAN) 8 MG tablet Take 1 tablet (8 mg total) by mouth 2 (two) times daily as needed for nausea or vomiting. (Patient not taking: Reported on 03/23/2021) 20 tablet 0   progesterone 200 MG SUPP Place 200 mg vaginally at bedtime.  valsartan (DIOVAN) 320 MG tablet Take 1 tablet (320 mg total) by mouth daily. 90 tablet 3   No current facility-administered medications for this visit.    PHYSICAL EXAMINATION: ECOG PERFORMANCE STATUS: 0 - Asymptomatic  Vitals:   05/13/21 1026  BP: (!) 143/98  Pulse: 82  Resp: 18  Temp: 99.4 F (37.4 C)  SpO2: 98%   Wt Readings from Last 3 Encounters:  05/13/21 254 lb (115.2 kg)  03/31/21 258 lb 9.6 oz (117.3 kg)  03/23/21 255 lb 3.2 oz (115.8 kg)     GENERAL:alert, no distress and comfortable SKIN: skin color normal, no rashes or significant lesions EYES: normal, Conjunctiva are pink and non-injected, sclera clear  NEURO: alert & oriented x 3 with fluent speech  LABORATORY DATA:  I have reviewed the data as listed CBC Latest Ref Rng & Units 05/13/2021 03/17/2021 02/11/2021  WBC 4.0 - 10.5 K/uL 8.5 7.2 8.4  Hemoglobin 13.0 - 17.0 g/dL 12.6(L) 12.5(L) 13.6  Hematocrit 39.0 - 52.0 %  36.9(L) 37.9(L) 39.9  Platelets 150 - 400 K/uL 260 263 250     CMP Latest Ref Rng & Units 03/17/2021 02/11/2021 11/04/2020  Glucose 70 - 99 mg/dL 140(H) 167(H) 142(H)  BUN 6 - 20 mg/dL 19 21(H) 18  Creatinine 0.61 - 1.24 mg/dL 0.94 1.03 1.00  Sodium 135 - 145 mmol/L 136 139 139  Potassium 3.5 - 5.1 mmol/L 3.2(L) 3.6 4.4  Chloride 98 - 111 mmol/L 99 102 99  CO2 22 - 32 mmol/L 27 24 28   Calcium 8.9 - 10.3 mg/dL 9.0 9.3 9.5  Total Protein 6.5 - 8.1 g/dL 7.1 7.8 7.6  Total Bilirubin 0.3 - 1.2 mg/dL 0.5 0.3 0.4  Alkaline Phos 38 - 126 U/L 55 56 58  AST 15 - 41 U/L 19 16 16   ALT 0 - 44 U/L 22 20 17       RADIOGRAPHIC STUDIES: I have personally reviewed the radiological images as listed and agreed with the findings in the report. No results found.    No orders of the defined types were placed in this encounter.  All questions were answered. The patient knows to call the clinic with any problems, questions or concerns. No barriers to learning was detected. The total time spent in the appointment was 30 minutes.     Truitt Merle, MD 05/13/2021   I, Wilburn Mylar, am acting as scribe for Truitt Merle, MD.   I have reviewed the above documentation for accuracy and completeness, and I agree with the above.

## 2021-05-13 NOTE — Progress Notes (Signed)
Dwayne Sawyer presents to clinic today for Orthovisc injection 2/3  Procedure: Real-time Ultrasound Guided Injection of right knee superior lateral patella space  Device: Philips Affiniti 50G Images permanently stored and available for review in PACS Verbal informed consent obtained.  Discussed risks and benefits of procedure. Warned about infection bleeding damage to structures skin hypopigmentation and fat atrophy among others. Patient expresses understanding and agreement Time-out conducted.   Noted no overlying erythema, induration, or other signs of local infection.   Skin prepped in a sterile fashion.   Local anesthesia: Topical Ethyl chloride.   With sterile technique and under real time ultrasound guidance:  Orthovisc 30mg  injected into right knee joint. Fluid seen entering the joint capsule.   Completed without difficulty   Advised to call if fevers/chills, erythema, induration, drainage, or persistent bleeding.   Images permanently stored and available for review in the ultrasound unit.  Impression: Technically successful ultrasound guided injection.  Lot number: [id number]  Return in 1 weeks for Orthovisc injection right knee 3/3

## 2021-05-13 NOTE — Patient Instructions (Signed)
Good to see you today.  You had a R knee Orthovisc injection 2/3. Call or go to the ER if you develop a large red swollen joint with extreme pain or oozing puss.   See you next week for the next injection in the series.

## 2021-05-15 ENCOUNTER — Encounter: Payer: Self-pay | Admitting: Oncology

## 2021-05-15 ENCOUNTER — Encounter: Payer: Self-pay | Admitting: Hematology

## 2021-05-17 LAB — CHROMOGRANIN A: Chromogranin A (ng/mL): 35.6 ng/mL (ref 0.0–101.8)

## 2021-05-20 ENCOUNTER — Other Ambulatory Visit: Payer: Self-pay

## 2021-05-20 ENCOUNTER — Ambulatory Visit (INDEPENDENT_AMBULATORY_CARE_PROVIDER_SITE_OTHER): Payer: 59 | Admitting: Family Medicine

## 2021-05-20 ENCOUNTER — Ambulatory Visit: Payer: Self-pay

## 2021-05-20 DIAGNOSIS — G8929 Other chronic pain: Secondary | ICD-10-CM

## 2021-05-20 DIAGNOSIS — M1711 Unilateral primary osteoarthritis, right knee: Secondary | ICD-10-CM

## 2021-05-20 DIAGNOSIS — M25561 Pain in right knee: Secondary | ICD-10-CM | POA: Diagnosis not present

## 2021-05-20 NOTE — Progress Notes (Signed)
Dwayne Sawyer presents to clinic today for Orthovisc injection right knee 3/3 Procedure: Real-time Ultrasound Guided Injection of right knee superior lateral patellar space Device: Philips Affiniti 50G Images permanently stored and available for review in PACS Verbal informed consent obtained.  Discussed risks and benefits of procedure. Warned about infection bleeding damage to structures skin hypopigmentation and fat atrophy among others. Patient expresses understanding and agreement Time-out conducted.   Noted no overlying erythema, induration, or other signs of local infection.   Skin prepped in a sterile fashion.   Local anesthesia: Topical Ethyl chloride.   With sterile technique and under real time ultrasound guidance: Orthovisc 30 mg injected into knee joint. Fluid seen entering the joint capsule.   Completed without difficulty   Advised to call if fevers/chills, erythema, induration, drainage, or persistent bleeding.   Images permanently stored and available for review in the ultrasound unit.  Impression: Technically successful ultrasound guided injection.   Lot number: [id number]  Recheck as needed  Of note Dwayne Sawyer notes that his not feeling much better.  If after a month or so he has not experienced much benefit he will let me know and neck step is probably referral to orthopedic surgery to consider surgical options including knee replacement.

## 2021-05-20 NOTE — Patient Instructions (Signed)
Good to see you today.  You had your final R knee Orthovisc injection.  Call or go to the ER if you develop a large red swollen joint with extreme pain or oozing puss.   Follow-up as needed.

## 2021-06-06 ENCOUNTER — Encounter: Payer: Self-pay | Admitting: Oncology

## 2021-06-08 NOTE — Progress Notes (Deleted)
   I, Wendy Poet, LAT, ATC, am serving as scribe for Dr. [name].  Dwayne Sawyer is a 50 y.o. 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  He was last seen by Dr. Georgina Snell on 05/20/21 for his final R knee Orthovisc injection in the series.  Today, pt reports   Diagnostic imaging: R knee MRI- 12/25/20; R knee XR- 12/14/20  Pertinent review of systems: ***  Relevant historical information: ***   Exam:  There were no vitals taken for this visit. General: Well Developed, well nourished, and in no acute distress.   MSK: ***    Lab and Radiology Results No results found for this or any previous visit (from the past 72 hour(s)). No results found.     Assessment and Plan: 50 y.o. adult with ***   PDMP not reviewed this encounter. No orders of the defined types were placed in this encounter.  No orders of the defined types were placed in this encounter.    Discussed warning signs or symptoms. Please see discharge instructions. Patient expresses understanding.   ***

## 2021-06-09 ENCOUNTER — Other Ambulatory Visit: Payer: Self-pay

## 2021-06-09 ENCOUNTER — Encounter: Payer: Self-pay | Admitting: Family Medicine

## 2021-06-09 ENCOUNTER — Ambulatory Visit (INDEPENDENT_AMBULATORY_CARE_PROVIDER_SITE_OTHER): Payer: 59 | Admitting: Family Medicine

## 2021-06-09 ENCOUNTER — Ambulatory Visit: Payer: 59 | Admitting: Family Medicine

## 2021-06-09 VITALS — BP 122/86 | HR 77 | Ht 69.0 in | Wt 251.8 lb

## 2021-06-09 DIAGNOSIS — M25561 Pain in right knee: Secondary | ICD-10-CM | POA: Diagnosis not present

## 2021-06-09 DIAGNOSIS — M1711 Unilateral primary osteoarthritis, right knee: Secondary | ICD-10-CM | POA: Diagnosis not present

## 2021-06-09 DIAGNOSIS — G8929 Other chronic pain: Secondary | ICD-10-CM | POA: Diagnosis not present

## 2021-06-09 NOTE — Progress Notes (Signed)
I, Wendy Poet, LAT, ATC, am serving as scribe for Dr. [name].  Dwayne Sawyer is a 50 y.o. adult who presents to [place] at [facility] today for continued chronic R knee pain. Pt was last seen by Dr. Georgina Snell on 05/20/21 and completed the Orthovisc series 3/3. Pt last R knee steroid injection was on 03/31/21. Today, pt reports that his R knee pain did not improve w/ the Orthovisc injections.  He locates his pain mainly to his R medial knee.  He notes that just daily walking for daily activity is beginning to bother him.  He does wear his knee brace w/ activity.  Dx imaging: 12/25/20 R knee MRI  12/14/20 R knee XR  Pertinent review of systems: No fevers or chills  Relevant historical information: Carcinoid tumor currently responding quite well to  [place] treatment is done recent PET scan October 2022.  Please see Dr. Ernestina Penna note from 05/13/21   Exam:  BP 122/86 (BP Location: Left Arm, Patient Position: Sitting, Cuff Size: Large)   Pulse 77   Ht 5\' 9"  (1.753 m)   Wt 251 lb 12.8 oz (114.2 kg)   SpO2 98%   BMI 37.18 kg/m  General: Well Developed, well nourished, and in no acute distress.   MSK: Right knee normal.  Normal motion with crepitation.    Lab and Radiology Results EXAM: MRI OF THE RIGHT KNEE WITHOUT CONTRAST   TECHNIQUE: Multiplanar, multisequence MR imaging of the knee was performed. No intravenous contrast was administered.   COMPARISON:  None.   FINDINGS: MENISCI   Medial: Degeneration of the medial meniscus. No discrete tear. Peripheral meniscal extrusion.   Lateral: Intact.   LIGAMENTS   Cruciates: ACL and PCL are intact.   Collaterals: Medial collateral ligament is intact. Lateral collateral ligament complex is intact.   CARTILAGE   Patellofemoral: Partial-thickness cartilage loss of the trochlear groove and medial trochlea. Cartilage fissuring of the medial patellar facet.   Medial: Full-thickness cartilage loss of the  medial femorotibial compartment with subchondral reactive marrow edema in the medial femoral condyle and medial tibial plateau.   Lateral:  No chondral defect.   JOINT: No joint effusion. Normal Hoffa's fat-pad. No plical thickening.   POPLITEAL FOSSA: Popliteus tendon is intact. Tiny Baker's cyst.   EXTENSOR MECHANISM: Intact quadriceps tendon. Intact patellar tendon. Intact lateral patellar retinaculum. Intact medial patellar retinaculum. Intact MPFL.   BONES: No aggressive osseous lesion. No fracture or dislocation.   Other: No fluid collection or hematoma. Muscles are normal.   IMPRESSION: 1. Full-thickness cartilage loss of the medial femorotibial compartment with subchondral reactive marrow edema in the medial femoral condyle and medial tibial plateau. 2. Partial-thickness cartilage loss of the trochlear groove and medial trochlea. Cartilage fissuring of the medial patellar facet.     Electronically Signed   By: Kathreen Devoid   On: 12/26/2020 07:33   I, [name], personally (independently) visualized and performed the interpretation of the images attached in this note.      Assessment and Plan: 50 y.o. adult with right knee pain.  Due to significant degenerative changes seen on MRI especially at the medial compartment.  Unfortunately Dwayne Sawyer has not responded well to conservative management strategies including steroid injection and most recently completed hyaluronic acid injection series.  At this point I think his best option is to have a surgical consultation to consider surgical options likely including total knee replacement.  He is in a good place with his cancer treatment  where he has had fantastic response to his treatment and I think is now a reasonably good surgical candidate.  Plan for orthopedic surgery referral.  Check back with me as needed.   PDMP not reviewed this encounter. Orders Placed This Encounter  Procedures   Ambulatory referral to Orthopedic  Surgery    Referral Priority:   Routine    Referral Type:   Surgical    Referral Reason:   Specialty Services Required    Requested Specialty:   Orthopedic Surgery    Number of Visits Requested:   1   No orders of the defined types were placed in this encounter.    Discussed warning signs or symptoms. Please see discharge instructions. Patient expresses understanding.   The above documentation has been reviewed and is accurate and complete [name], M.D.

## 2021-06-09 NOTE — Patient Instructions (Addendum)
Good to see you today.  I've referred you to [facility] for another opinion.  Follow-up as needed.

## 2021-06-14 ENCOUNTER — Encounter: Payer: Self-pay | Admitting: Family Medicine

## 2021-06-14 ENCOUNTER — Ambulatory Visit (INDEPENDENT_AMBULATORY_CARE_PROVIDER_SITE_OTHER): Payer: 59 | Admitting: Family Medicine

## 2021-06-14 ENCOUNTER — Other Ambulatory Visit: Payer: Self-pay

## 2021-06-14 VITALS — BP 124/82 | HR 77 | Temp 97.2°F | Ht 69.0 in | Wt 253.2 lb

## 2021-06-14 DIAGNOSIS — I10 Essential (primary) hypertension: Secondary | ICD-10-CM | POA: Diagnosis not present

## 2021-06-14 DIAGNOSIS — M1711 Unilateral primary osteoarthritis, right knee: Secondary | ICD-10-CM | POA: Diagnosis not present

## 2021-06-14 DIAGNOSIS — G4733 Obstructive sleep apnea (adult) (pediatric): Secondary | ICD-10-CM | POA: Diagnosis not present

## 2021-06-14 DIAGNOSIS — D3A011 Benign carcinoid tumor of the jejunum: Secondary | ICD-10-CM

## 2021-06-14 DIAGNOSIS — I1A0 Resistant hypertension: Secondary | ICD-10-CM

## 2021-06-14 NOTE — Progress Notes (Signed)
[facility] [address] [name] [address] Dept: [phone] Dept Fax: [phone]  Chronic Care Office Visit  Subjective:    Patient ID: Dwayne Sawyer, adult    DOB: Feb 21, 1971, 50 y.o..   MRN: [id number]  Chief Complaint  Patient presents with   Follow-up    3 month f/u HTN/chol/DM.      History of Present Illness:  Patient is in today for reassessment of chronic medical issues.  Dwayne Sawyer notes that he is overall doing okay at this point. 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 He has completed  a course of Lutathera (Lutetium Lu-177 dototate) His follow-up PET scan showed remission of his carcinoids.    Dwayne Sawyer  in [place]. He currently is on estrogen and progesterone. He feels better about himself on these medications. He was pleased that the PET scan commented on gynecomastia. He feels the Lutathera may have been having some impact on his hormones, as he feels much better now.   Dwayne Sawyer  has a history of resistant hypertension. He has noted some improved blood pressures at home (132/84, 118/70). He is currently managed on amlodipine, carvedilol, valsartan and chlorthalidone. He feels the switch from HCTZ to chlorthalidone has helped.   Dwayne Sawyer  has a history of OSA. He notes that his DME supplier is getting closer to having him a CPAP machine to use.  Dwayne Sawyer has a history of right knee osteoarthritis. He has failed treatment with intraarticular steroids and Synvisc. He will be seeing the orthopedist soon to discuss possible total knee joint replacement.  Past Medical History: Patient Active Problem List   Diagnosis Date Noted   Primary osteoarthritis of right knee 12/30/2020   Meniscus, medial, derangement, right 12/30/2020   BMI 37.0-37.9, adult 12/10/2020   Obstructive sleep apnea syndrome, severe 11/29/2020   Hyperlipidemia 09/06/2020    Resistant hypertension 09/06/2020   Pre-diabetes 09/06/2020   Family history of carcinoid tumor    Carcinoid tumor of small intestine 06/13/2019   Bowel obstruction ([place]) 06/11/2019   Rheumatoid arthritis involving multiple sites with positive rheumatoid factor ([place]) 10/13/2015   Past Surgical History:  Procedure Laterality Date   HERNIA REPAIR     LAPAROTOMY N/A 06/12/2019   Procedure: EXPLORATORY LAPAROTOMY WITH SMALL BOWEL RESECTION;  Surgeon: Johnathan Hausen, MD;  Location: [facility];  Service: General;  Laterality: N/A;   SHOULDER SURGERY     SMALL INTESTINE SURGERY     Family History  Problem Relation Age of Onset   Cancer Father        widespread carcinoid cancer   Hypertension Mother    Hyperlipidemia Mother    Outpatient Medications Prior to Visit  Medication Sig Dispense Refill   amLODipine (NORVASC) 10 MG tablet Take 1 tablet (10 mg total) by mouth daily. 90 tablet 1   atorvastatin (LIPITOR) 40 MG tablet TAKE ONE TABLET BY MOUTH DAILY AT 6:00 IN THE EVENING 90 tablet 3   carvedilol (COREG) 25 MG tablet Take 1 tablet (25 mg total) by mouth 2 (two) times daily with a meal. 180 tablet 3   chlorthalidone (HYGROTON) 25 MG tablet Take 1 tablet (25 mg total) by mouth daily. 90 tablet 3   estrogens, conjugated, (PREMARIN) 0.3 MG tablet Take 0.3 mg by mouth daily. Take 4 mg in the morning an 2 in evening.     metFORMIN (GLUCOPHAGE) 500 MG tablet TAKE ONE TABLET BY MOUTH EVERY  MORNING WITH BREAKFAST 90 tablet 1   progesterone 200 MG SUPP Place 200 mg vaginally at bedtime.     valsartan (DIOVAN) 320 MG tablet Take 1 tablet (320 mg total) by mouth daily. 90 tablet 3   fluticasone (FLONASE) 50 MCG/ACT nasal spray Place 2 sprays into both nostrils daily. (Patient not taking: Reported on 06/14/2021) 9.9 mL 0   ondansetron (ZOFRAN) 8 MG tablet Take 1 tablet (8 mg total) by mouth 2 (two) times daily as needed for nausea or vomiting. (Patient not taking: Reported on 06/14/2021) 20 tablet 0    ondansetron (ZOFRAN) 8 MG tablet Take 1 tablet (8 mg total) by mouth 2 (two) times daily as needed for nausea or vomiting. (Patient not taking: Reported on 03/23/2021) 20 tablet 0   No facility-administered medications prior to visit.   No Known Allergies    Objective:   Today's Vitals   06/14/21 1009  BP: 124/82  Pulse: 77  Temp: (!) 97.2 F (36.2 C)  SpO2: 96%  Weight: 253 lb 3.2 oz (114.9 kg)  Height: 5\' 9"  (1.753 m)   Body mass index is 37.39 kg/m.   General: Well developed, well nourished. No acute distress. Psych: Alert and oriented x3. Normal mood and affect.  Health Maintenance Due  Topic Date Due   Pneumococcal Vaccine 80-76 Years old (1 - PCV) Never done   Hepatitis C Screening  Never done   Zoster Vaccines- Shingrix (1 of 2) Never done   PAP SMEAR-Modifier  Never done   COLONOSCOPY (Pts 45-24yrs Insurance coverage will need to be confirmed)  Never done   MAMMOGRAM  Never done   COVID-19 Vaccine (2 - [place] risk series) 06/04/2020   INFLUENZA VACCINE  Never done   Lab Results CBC Latest Ref Rng & Units 05/13/2021 03/17/2021 02/11/2021  WBC 4.0 - 10.5 K/uL 8.5 7.2 8.4  Hemoglobin 13.0 - 17.0 g/dL 12.6(L) 12.5(L) 13.6  Hematocrit 39.0 - 52.0 % 36.9(L) 37.9(L) 39.9  Platelets 150 - 400 K/uL 260 263 250   CMP Latest Ref Rng & Units 05/13/2021 03/17/2021 02/11/2021  Glucose 70 - 99 mg/dL 140(H) 140(H) 167(H)  BUN 6 - 20 mg/dL 20 19 21(H)  Creatinine 0.61 - 1.24 mg/dL 0.98 0.94 1.03  Sodium 135 - 145 mmol/L 137 136 139  Potassium 3.5 - 5.1 mmol/L 3.6 3.2(L) 3.6  Chloride 98 - 111 mmol/L 100 99 102  CO2 22 - 32 mmol/L 27 27 24   Calcium 8.9 - 10.3 mg/dL 9.4 9.0 9.3  Total Protein 6.5 - 8.1 g/dL 7.7 7.1 7.8  Total Bilirubin 0.3 - 1.2 mg/dL 0.4 0.5 0.3  Alkaline Phos 38 - 126 U/L 56 55 56  AST 15 - 41 U/L 25 19 16   ALT 0 - 44 U/L 21 22 20    Lab Results  Component Value Date   CHOL 172 09/06/2020   HDL 49.80 09/06/2020   LDLDIRECT 88.0 09/06/2020   TRIG 244.0  (H) 09/06/2020   CHOLHDL 3 09/06/2020   Imaging: NM PET scan (04/28/2021) IMPRESSION: 1. Interval decrease in radiotracer activity of neuroendocrine tumor hepatic metastasis. Dominant lesion the RIGHT hepatic lobe is mildly in size. Several small peripheral lesions now have no radiotracer activity. Lesions are conspicuous in liver on noncontrast exam due to hepatic steatosis. 2. Small lesion in the head of the pancreas has decreased radiotracer activity. Lesion difficult to define on CT portion exam. 3. No evidence new metastatic neuroendocrine tumor. 4. Post RIGHT hemicolectomy anatomy without complication 5. New bilateral  symmetric gynecomastia.    Assessment & Plan:   1. Resistant hypertension Blood pressure is much improved compared to earlier this year. It may be that the Lutathera was contributing to the uncontrolled hypertension. We will continue his current 4-drug treatment.  2. Carcinoid tumor of jejunum, unspecified whether malignant Appears to be in remission. Dwayne Sawyer is very pleased with his response.  3. Primary osteoarthritis of right knee Dwayne Sawyer will follow up with orthopedics regarding potential surgery.  4. Obstructive sleep apnea syndrome, severe CPAP pending.  [name], MD

## 2021-06-22 ENCOUNTER — Other Ambulatory Visit: Payer: Self-pay

## 2021-06-22 ENCOUNTER — Ambulatory Visit (INDEPENDENT_AMBULATORY_CARE_PROVIDER_SITE_OTHER): Payer: 59 | Admitting: Orthopaedic Surgery

## 2021-06-22 ENCOUNTER — Ambulatory Visit (INDEPENDENT_AMBULATORY_CARE_PROVIDER_SITE_OTHER): Payer: 59

## 2021-06-22 ENCOUNTER — Encounter: Payer: Self-pay | Admitting: Orthopaedic Surgery

## 2021-06-22 DIAGNOSIS — M1711 Unilateral primary osteoarthritis, right knee: Secondary | ICD-10-CM | POA: Diagnosis not present

## 2021-06-22 NOTE — Progress Notes (Signed)
Office Visit Note   Patient: Dwayne Sawyer           Date of Birth: Feb 27, 1971           MRN: [id number] Visit Date: 06/22/2021              Requested by: Gregor Hams, MD [place],  [place] [address] PCP: [name], MD   Assessment & Plan: Visit Diagnoses:  1. Primary osteoarthritis of right knee     Plan: Impression is right knee degenerative joint disease.  The patient has tried multiple over-the-counter medications as well as cortisone and viscosupplementation injections without long-lasting relief.  At this point, he is interested in proceeding with right total knee replacement.  Denies allergy to nickel.  Risk, benefits, rehab recovery time discussed.  All questions were answered.  Follow-Up Instructions: Return for post-op.   Orders:  Orders Placed This Encounter  Procedures   XR KNEE 3 VIEW RIGHT    No orders of the defined types were placed in this encounter.     Procedures: No procedures performed   Clinical Data: No additional findings.   Subjective: Chief Complaint  Patient presents with   Right Knee - Pain    HPI patient is a pleasant 50 year old who comes in today with right knee pain.  This is been ongoing for the past 20 years.  He denies any specific injury, but does note he was in the [employer] where he was quite active.  The pain he has at the medial knee.  He describes this as a constant ache worse with any activity.  He wears a knee brace which does seem to help.  He takes an occasional Tylenol without relief.  He has been seeing Dr. Bertram Millard where he has undergone cortisone as well as viscosupplementation injections without any relief.  He has a history of carcinoid intestinal cancer.  He is not on any blood thinners.  Review of Systems as detailed in HPI.  All others reviewed and are negative.   Objective: Vital Signs: There were no vitals taken for this visit.  Physical Exam well-developed well-nourished person in no  acute distress.  Alert and oriented x3.  Ortho Exam right knee exam shows trace effusion.  Range of motion 0 to 100 degrees.  Medial joint line tenderness.  Ligaments are stable. neurovascular intact distally.  Specialty Comments:  No specialty comments available.  Imaging: X-rays reviewed by me in canopy reveal moderate degenerative changes medial compartment   PMFS History: Patient Active Problem List   Diagnosis Date Noted   Primary osteoarthritis of right knee 12/30/2020   Meniscus, medial, derangement, right 12/30/2020   BMI 37.0-37.9, adult 12/10/2020   Obstructive sleep apnea syndrome, severe 11/29/2020   Hyperlipidemia 09/06/2020   Resistant hypertension 09/06/2020   Pre-diabetes 09/06/2020   Family history of carcinoid tumor    Carcinoid tumor of small intestine 06/13/2019   Bowel obstruction ([place]) 06/11/2019   Rheumatoid arthritis involving multiple sites with positive rheumatoid factor ([place]) 10/13/2015   Past Medical History:  Diagnosis Date   Arthritis    Cancer ([place])    Family history of carcinoid tumor    High cholesterol    Hypertension     Family History  Problem Relation Age of Onset   Cancer Father        widespread carcinoid cancer   Hypertension Mother    Hyperlipidemia Mother     Past Surgical History:  Procedure Laterality Date  HERNIA REPAIR     LAPAROTOMY N/A 06/12/2019   Procedure: EXPLORATORY LAPAROTOMY WITH SMALL BOWEL RESECTION;  Surgeon: Johnathan Hausen, MD;  Location: [facility];  Service: General;  Laterality: N/A;   SHOULDER SURGERY     SMALL INTESTINE SURGERY     Social History   Occupational History   Not on file  Tobacco Use   Smoking status: Never   Smokeless tobacco: Former    Types: Nurse, children's Use: Never used  Substance and Sexual Activity   Alcohol use: Yes    Comment: occ   Drug use: Never   Sexual activity: Not on file

## 2021-06-30 ENCOUNTER — Ambulatory Visit: Payer: 59 | Admitting: Family Medicine

## 2021-07-20 ENCOUNTER — Other Ambulatory Visit: Payer: Self-pay

## 2021-08-08 ENCOUNTER — Other Ambulatory Visit: Payer: Self-pay | Admitting: Physician Assistant

## 2021-08-08 ENCOUNTER — Telehealth: Payer: Self-pay | Admitting: Hematology

## 2021-08-08 MED ORDER — METHOCARBAMOL 500 MG PO TABS: 500.0000 mg | ORAL_TABLET | Freq: Two times a day (BID) | ORAL | 0 refills | Status: DC | PRN

## 2021-08-08 MED ORDER — ASPIRIN EC 81 MG PO TBEC: 81.0000 mg | DELAYED_RELEASE_TABLET | Freq: Two times a day (BID) | ORAL | 0 refills | Status: DC

## 2021-08-08 MED ORDER — ONDANSETRON HCL 4 MG PO TABS: 4.0000 mg | ORAL_TABLET | Freq: Three times a day (TID) | ORAL | 0 refills | Status: DC | PRN

## 2021-08-08 MED ORDER — DOCUSATE SODIUM 100 MG PO CAPS: 100.0000 mg | ORAL_CAPSULE | Freq: Every day | ORAL | 2 refills | Status: DC | PRN

## 2021-08-08 MED ORDER — OXYCODONE-ACETAMINOPHEN 5-325 MG PO TABS: 1.0000 | ORAL_TABLET | Freq: Four times a day (QID) | ORAL | 0 refills | Status: DC | PRN

## 2021-08-08 NOTE — Telephone Encounter (Signed)
Rescheduled appointment per 1/16 scheduling message. Patient is aware.

## 2021-08-09 ENCOUNTER — Other Ambulatory Visit: Payer: Self-pay

## 2021-08-09 DIAGNOSIS — D3A011 Benign carcinoid tumor of the jejunum: Secondary | ICD-10-CM

## 2021-08-09 DIAGNOSIS — C7A011 Malignant carcinoid tumor of the jejunum: Secondary | ICD-10-CM

## 2021-08-10 ENCOUNTER — Other Ambulatory Visit: Payer: 59

## 2021-08-10 NOTE — Pre-Procedure Instructions (Signed)
Surgical Instructions    Your procedure is scheduled on Monday, January 23rd.  Report to [facility] Main Entrance "A" at 05:30 A.M., then check in with the Admitting office.  Call this number if you have problems the morning of surgery:  [phone]   If you have any questions prior to your surgery date call [phone]: Open Monday-Friday 8am-4pm    Remember:  Do not eat after midnight the night before your surgery  You may drink clear liquids until 04:30 AM the morning of your surgery.   Clear liquids allowed are: Water, Non-Citrus Juices (without pulp), Carbonated Beverages, Clear Tea, Black Coffee Only, and Gatorade   Patient Instructions  The night before surgery:  No food after midnight. ONLY clear liquids after midnight   The day of surgery (if you have diabetes): Drink ONE (1) 12 oz G2 given to you in your pre admission testing appointment by 04:30 AM the morning of surgery. Drink in one sitting. Do not sip.  This drink was given to you during your hospital  pre-op appointment visit.  Nothing else to drink after completing the  12 oz bottle of G2.         If you have questions, please contact your surgeons office.      Take these medicines the morning of surgery with A SIP OF WATER  amLODipine (NORVASC)  carvedilol (COREG) estrogens, conjugated, (PREMARIN)    If needed: ondansetron (ZOFRAN)   As of today, STOP taking any Aspirin (unless otherwise instructed by your surgeon) Aleve, Naproxen, Ibuprofen, Motrin, Advil, Goody's, BC's, all herbal medications, fish oil, and all vitamins.   WHAT DO I DO ABOUT MY DIABETES MEDICATION?   Do not take metFORMIN (GLUCOPHAGE) the morning of surgery.     HOW TO MANAGE YOUR DIABETES BEFORE AND AFTER SURGERY  Why is it important to control my blood sugar before and after surgery? Improving blood sugar levels before and after surgery helps healing and can limit problems. A way of improving blood sugar control is  eating a healthy diet by:  Eating less sugar and carbohydrates  Increasing activity/exercise  Talking with your doctor about reaching your blood sugar goals High blood sugars (greater than 180 mg/dL) can raise your risk of infections and slow your recovery, so you will need to focus on controlling your diabetes during the weeks before surgery. Make sure that the doctor who takes care of your diabetes knows about your planned surgery including the date and location.  How do I manage my blood sugar before surgery? Check your blood sugar at least 4 times a day, starting 2 days before surgery, to make sure that the level is not too high or low.  Check your blood sugar the morning of your surgery when you wake up and every 2 hours until you get to the Short Stay unit.  If your blood sugar is less than 70 mg/dL, you will need to treat for low blood sugar: Do not take insulin. Treat a low blood sugar (less than 70 mg/dL) with  cup of clear juice (cranberry or apple), 4 glucose tablets, OR glucose gel. Recheck blood sugar in 15 minutes after treatment (to make sure it is greater than 70 mg/dL). If your blood sugar is not greater than 70 mg/dL on recheck, call [phone] for further instructions. Report your blood sugar to the short stay nurse when you get to Short Stay.  If you are admitted to the hospital after surgery: Your blood sugar will be  checked by the staff and you will probably be given insulin after surgery (instead of oral diabetes medicines) to make sure you have good blood sugar levels. The goal for blood sugar control after surgery is 80-180 mg/dL.                     Do NOT Smoke (Tobacco/Vaping) or drink Alcohol 24 hours prior to your procedure.  If you use a CPAP at night, you may bring all equipment for your overnight stay.   Contacts, glasses, piercing's, hearing aid's, dentures or partials may not be worn into surgery, please bring cases for these belongings.    For  patients admitted to the hospital, discharge time will be determined by your treatment team.   Patients discharged the day of surgery will not be allowed to drive home, and someone needs to stay with them for 24 hours.  NO VISITORS WILL BE ALLOWED IN PRE-OP WHERE PATIENTS GET READY FOR SURGERY.  ONLY 1 SUPPORT PERSON MAY BE PRESENT IN THE WAITING ROOM WHILE YOU ARE IN SURGERY.  IF YOU ARE TO BE ADMITTED, ONCE YOU ARE IN YOUR ROOM YOU WILL BE ALLOWED TWO (2) VISITORS.  Minor children may have two parents present. Special consideration for safety and communication needs will be reviewed on a case by case basis.   Special instructions:   [facility]- Preparing For Surgery  Before surgery, you can play an important role. Because skin is not sterile, your skin needs to be as free of germs as possible. You can reduce the number of germs on your skin by washing with CHG (chlorahexidine gluconate) Soap before surgery.  CHG is an antiseptic cleaner which kills germs and bonds with the skin to continue killing germs even after washing.    Oral Hygiene is also important to reduce your risk of infection.  Remember - BRUSH YOUR TEETH THE MORNING OF SURGERY WITH YOUR REGULAR TOOTHPASTE  Please do not use if you have an allergy to CHG or antibacterial soaps. If your skin becomes reddened/irritated stop using the CHG.  Do not shave (including legs and underarms) for at least 48 hours prior to first CHG shower. It is OK to shave your face.  Please follow these instructions carefully.   Shower the NIGHT BEFORE SURGERY and the MORNING OF SURGERY  If you chose to wash your hair, wash your hair first as usual with your normal shampoo.  After you shampoo, rinse your hair and body thoroughly to remove the shampoo.  Use CHG Soap as you would any other liquid soap. You can apply CHG directly to the skin and wash gently with a scrungie or a clean washcloth.   Apply the CHG Soap to your body ONLY FROM THE NECK DOWN.   Do not use on open wounds or open sores. Avoid contact with your eyes, ears, mouth and genitals (private parts). Wash Face and genitals (private parts)  with your normal soap.   Wash thoroughly, paying special attention to the area where your surgery will be performed.  Thoroughly rinse your body with warm water from the neck down.  DO NOT shower/wash with your normal soap after using and rinsing off the CHG Soap.  Pat yourself dry with a CLEAN TOWEL.  Wear CLEAN PAJAMAS to bed the night before surgery  Place CLEAN SHEETS on your bed the night before your surgery  DO NOT SLEEP WITH PETS.   Day of Surgery: Shower with CHG soap. Do not wear  jewelry, make up, nail polish, gel polish, artificial nails, or any other type of covering on natural nails including finger and toenails. If patients have artificial nails, gel coating, etc. that need to be removed by a nail salon please have this removed prior to surgery. Surgery may need to be canceled/delayed if the surgeon/ anesthesia feels like the patient is unable to be adequately monitored. Do not wear lotions, powders, perfumes/colognes, or deodorant. Do not shave 48 hours prior to surgery.  Men may shave face and neck. Do not bring valuables to the hospital. [facility] is not responsible for any belongings or valuables. Wear Clean/Comfortable clothing the morning of surgery Remember to brush your teeth WITH YOUR REGULAR TOOTHPASTE.   Please read over the following fact sheets that you were given.   3 days prior to your procedure or After your COVID test   You are not required to quarantine however you are required to wear a well-fitting mask when you are out and around people not in your household. If your mask becomes wet or soiled, replace with a new one.   Wash your hands often with soap and water for 20 seconds or clean your hands with an alcohol-based hand sanitizer that contains at least 60% alcohol.   Do not share personal  items.   Notify your provider:  o if you are in close contact with someone who has COVID  o or if you develop a fever of 100.4 or greater, sneezing, cough, sore throat, shortness of breath or body aches.

## 2021-08-11 ENCOUNTER — Other Ambulatory Visit: Payer: Self-pay

## 2021-08-11 ENCOUNTER — Encounter (HOSPITAL_COMMUNITY): Payer: Self-pay

## 2021-08-11 ENCOUNTER — Encounter (HOSPITAL_COMMUNITY)
Admission: RE | Admit: 2021-08-11 | Discharge: 2021-08-11 | Disposition: A | Discharge status: Home / Self Care | Payer: 59 | Source: Ambulatory Visit | Attending: Orthopaedic Surgery | Admitting: Orthopaedic Surgery

## 2021-08-11 VITALS — BP 142/94 | HR 79 | Temp 98.5°F | Resp 18 | Ht 69.0 in | Wt 250.6 lb

## 2021-08-11 DIAGNOSIS — Z01812 Encounter for preprocedural laboratory examination: Secondary | ICD-10-CM | POA: Insufficient documentation

## 2021-08-11 DIAGNOSIS — U071 COVID-19: Secondary | ICD-10-CM | POA: Insufficient documentation

## 2021-08-11 DIAGNOSIS — Z01818 Encounter for other preprocedural examination: Secondary | ICD-10-CM

## 2021-08-11 DIAGNOSIS — M1711 Unilateral primary osteoarthritis, right knee: Secondary | ICD-10-CM | POA: Diagnosis not present

## 2021-08-11 DIAGNOSIS — R7303 Prediabetes: Secondary | ICD-10-CM | POA: Insufficient documentation

## 2021-08-11 HISTORY — DX: Type 2 diabetes mellitus without complications: E11.9

## 2021-08-11 LAB — CBC WITH DIFFERENTIAL/PLATELET
Abs Immature Granulocytes: 0.03 10*3/uL (ref 0.00–0.07)
Basophils Absolute: 0.1 10*3/uL (ref 0.0–0.1)
Basophils Relative: 1 %
Eosinophils Absolute: 0.3 10*3/uL (ref 0.0–0.5)
Eosinophils Relative: 4 %
HCT: 37.2 % — ABNORMAL LOW (ref 39.0–52.0)
Hemoglobin: 12.7 g/dL — ABNORMAL LOW (ref 13.0–17.0)
Immature Granulocytes: 0 %
Lymphocytes Relative: 11 %
Lymphs Abs: 0.8 10*3/uL (ref 0.7–4.0)
MCH: 30.5 pg (ref 26.0–34.0)
MCHC: 34.1 g/dL (ref 30.0–36.0)
MCV: 89.4 fL (ref 80.0–100.0)
Monocytes Absolute: 0.9 10*3/uL (ref 0.1–1.0)
Monocytes Relative: 11 %
Neutro Abs: 5.6 10*3/uL (ref 1.7–7.7)
Neutrophils Relative %: 73 %
Platelets: 291 10*3/uL (ref 150–400)
RBC: 4.16 MIL/uL — ABNORMAL LOW (ref 4.22–5.81)
RDW: 12 % (ref 11.5–15.5)
WBC: 7.7 10*3/uL (ref 4.0–10.5)
nRBC: 0 % (ref 0.0–0.2)

## 2021-08-11 LAB — GLUCOSE, CAPILLARY: Glucose-Capillary: 172 mg/dL — ABNORMAL HIGH (ref 70–99)

## 2021-08-11 LAB — COMPREHENSIVE METABOLIC PANEL
ALT: 28 U/L (ref 0–44)
AST: 32 U/L (ref 15–41)
Albumin: 3.7 g/dL (ref 3.5–5.0)
Alkaline Phosphatase: 67 U/L (ref 38–126)
Anion gap: 10 (ref 5–15)
BUN: 19 mg/dL (ref 6–20)
CO2: 29 mmol/L (ref 22–32)
Calcium: 9.4 mg/dL (ref 8.9–10.3)
Chloride: 101 mmol/L (ref 98–111)
Creatinine, Ser: 1.12 mg/dL (ref 0.61–1.24)
GFR, Estimated: >60 mL/min (ref >60)
Glucose, Bld: 144 mg/dL — ABNORMAL HIGH (ref 70–99)
Potassium: 4 mmol/L (ref 3.5–5.1)
Sodium: 140 mmol/L (ref 135–145)
Total Bilirubin: 0.2 mg/dL — ABNORMAL LOW (ref 0.3–1.2)
Total Protein: 7.1 g/dL (ref 6.5–8.1)

## 2021-08-11 LAB — SURGICAL PCR SCREEN
MRSA, PCR: NEGATIVE
Staphylococcus aureus: NEGATIVE

## 2021-08-11 LAB — HEMOGLOBIN A1C
Hgb A1c MFr Bld: 7.3 % — ABNORMAL HIGH (ref 4.8–5.6)
Mean Plasma Glucose: 162.81 mg/dL

## 2021-08-11 LAB — SARS CORONAVIRUS 2 (TAT 6-24 HRS): SARS Coronavirus 2: POSITIVE — AB

## 2021-08-11 NOTE — Progress Notes (Signed)
PCP - Dr. Arlester Marker Cardiologist - Dr. Skeet Latch- pt last saw her in 02/2021 for resistant HTN. His BP readings have been normal at his last visit with her and his last PCP visit. Jeneen Rinks, Anesthesia PA consulted. No cardiac clearance needed at this time  PPM/ICD - denies   Chest x-ray - denies EKG - 10/11/20 Stress Test - denies ECHO - denies Cardiac Cath - denies  Sleep Study - 2022, OSA+ CPAP - pt waiting on CPAP machine  DM- Type 2 Pt does not check CBG at home  Blood Thinner Instructions: n/a Aspirin Instructions: n/a  ERAS Protcol - yes PRE-SURGERY G2- given at PAT  COVID TEST- 08/11/21 at PAT   Anesthesia review: no  Patient denies shortness of breath, fever, cough and chest pain at PAT appointment   All instructions explained to the patient, with a verbal understanding of the material. Patient agrees to go over the instructions while at home for a better understanding. Patient also instructed to wear a mask in public after being tested for COVID-19. The opportunity to ask questions was provided.

## 2021-08-12 NOTE — Progress Notes (Signed)
Secure chat message sent to Dr Phoebe Sharps scheduler re covid test result from 08/11/2021

## 2021-08-14 ENCOUNTER — Telehealth: Payer: 59 | Admitting: Family

## 2021-08-14 DIAGNOSIS — U071 COVID-19: Secondary | ICD-10-CM

## 2021-08-14 MED ORDER — MOLNUPIRAVIR EUA 200MG CAPSULE: 4.0000 | ORAL_CAPSULE | Freq: Two times a day (BID) | ORAL | 0 refills | Status: AC

## 2021-08-14 NOTE — Progress Notes (Signed)
Virtual Visit Consent   Dwayne Sawyer, you are scheduled for a virtual visit with a [facility] provider today.     Just as with appointments in the office, your consent must be obtained to participate.  Your consent will be active for this visit and any virtual visit you may have with one of our providers in the next 365 days.     If you have a MyChart account, a copy of this consent can be sent to you electronically.  All virtual visits are billed to your insurance company just like a traditional visit in the office.    As this is a virtual visit, video technology does not allow for your provider to perform a traditional examination.  This may limit your provider's ability to fully assess your condition.  If your provider identifies any concerns that need to be evaluated in person or the need to arrange testing (such as labs, EKG, etc.), we will make arrangements to do so.     Although advances in technology are sophisticated, we cannot ensure that it will always work on either your end or our end.  If the connection with a video visit is poor, the visit may have to be switched to a telephone visit.  With either a video or telephone visit, we are not always able to ensure that we have a secure connection.     I need to obtain your verbal consent now.   Are you willing to proceed with your visit today?    Dwayne Sawyer has provided verbal consent on 08/14/2021 for a virtual visit (video or telephone).   Evelina Dun, FNP   Date: 08/14/2021 8:44 AM   Virtual Visit via Video Note   I, Evelina Dun, connected with  Dwayne Sawyer  ([id number], Mar 28, 1971) on 08/14/21 at  8:45 AM EST by a video-enabled telemedicine application and verified that I am speaking with the correct person using two identifiers.  Location: Patient: Virtual Visit Location Patient: Home Provider: Virtual Visit Location Provider: Home Office   I discussed the limitations of evaluation and management by telemedicine and  the availability of in person appointments. The patient expressed understanding and agreed to proceed.    History of Present Illness: Dwayne Sawyer is a 51 y.o. who identifies as a nonbinary who was assigned adult at birth, and is being seen today for congestion and diagnosed with COVID on Thursday.  HPI: Cough This is a new problem. The current episode started in the past 7 days. The problem has been gradually worsening. The cough is Productive of sputum. Associated symptoms include myalgias, nasal congestion, postnasal drip and rhinorrhea. Pertinent negatives include no chills, ear congestion, ear pain, fever, headaches, shortness of breath or wheezing. He has tried rest and OTC cough suppressant for the symptoms. The treatment provided mild relief.   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 To be taken after surgery, Disp: 84 tablet, Rfl: 0   docusate sodium (COLACE) 100 MG capsule, Take 1 capsule (100 mg total) by mouth daily as needed., Disp: 30 capsule, Rfl: 2   methocarbamol (ROBAXIN) 500 MG tablet, Take 1 tablet (500 mg total) by mouth 2 (two) times daily as needed. To be taken after surgery, Disp: 20 tablet, Rfl: 0   molnupiravir EUA (LAGEVRIO) 200 mg CAPS capsule, Take 4 capsules (800 mg total) by mouth 2  (two) times daily for 5 days., Disp: 40 capsule, Rfl: 0   ondansetron (ZOFRAN) 4 MG tablet, Take 1 tablet (4 mg total) by mouth every 8 (eight) hours as needed for nausea or vomiting., Disp: 40 tablet, Rfl: 0   oxyCODONE-acetaminophen (PERCOCET) 5-325 MG tablet, Take 1-2 tablets by mouth every 6 (six) hours as needed. To be taken after surgery, Disp: 40 tablet, Rfl: 0   amLODipine (NORVASC) 10 MG tablet, Take 1 tablet (10 mg total) by mouth daily., Disp: 90 tablet, Rfl: 1   atorvastatin (LIPITOR) 40 MG tablet, TAKE ONE TABLET BY MOUTH DAILY AT 6:00 IN THE EVENING, Disp: 90 tablet, Rfl: 3   carvedilol (COREG) 25 MG tablet, Take 1 tablet (25 mg total) by mouth 2 (two) times daily with a meal., Disp: 180 tablet, Rfl: 3   chlorthalidone (HYGROTON) 25 MG tablet, Take 1 tablet (25 mg total) by mouth daily., Disp: 90 tablet, Rfl: 3   estrogens, conjugated, (PREMARIN) 0.3 MG tablet, Take 0.3-0.6 mg by mouth See admin instructions. Take 0.6 mg by mouth in the morning and 0.3 mg in the evening, Disp: , Rfl:    metFORMIN (GLUCOPHAGE) 500 MG tablet, TAKE ONE TABLET BY MOUTH EVERY MORNING WITH BREAKFAST, Disp: 90 tablet, Rfl: 1   progesterone 200 MG SUPP, Place 200 mg vaginally at bedtime., Disp: , Rfl:    valsartan (DIOVAN) 320 MG tablet, Take 1 tablet (320 mg total) by mouth daily., Disp: 90 tablet, Rfl: 3  Observations/Objective: Patient is well-developed, well-nourished in no acute distress.  Resting comfortably  at home.  Head is normocephalic, atraumatic.  No labored breathing.  Speech is clear and coherent with logical content.  Patient is alert and oriented at baseline.  Nasal congestion  Assessment and Plan: 1. COVID-19 - molnupiravir EUA (LAGEVRIO) 200 mg CAPS capsule; Take 4 capsules (800 mg total) by mouth 2 (two) times daily for 5 days.  Dispense: 40 capsule; Refill: 0  COVID positive, rest, force fluids, tylenol as needed, Quarantine for at least 5 days and you are fever free, then must  wear a mask out in public from day 7-40, report any worsening symptoms such as increased shortness of breath, swelling, or continued high fevers. Possible adverse effects discussed with antivirals.    Follow Up Instructions: I discussed the assessment and treatment plan with the patient. The patient was provided an opportunity to ask questions and all were answered. The patient agreed with the plan and demonstrated an understanding of the instructions.  A copy of instructions were sent to the patient via MyChart unless otherwise noted below.     The patient was advised to call back or seek an in-person evaluation if the symptoms worsen or if the condition fails to improve as anticipated.  Time:  I spent 9 minutes with the patient via telehealth technology discussing the above problems/concerns.    Evelina Dun, FNP

## 2021-08-15 DIAGNOSIS — M1711 Unilateral primary osteoarthritis, right knee: Secondary | ICD-10-CM

## 2021-08-17 ENCOUNTER — Inpatient Hospital Stay: Payer: 59 | Admitting: Hematology

## 2021-09-01 ENCOUNTER — Encounter: Payer: 59 | Admitting: Orthopaedic Surgery

## 2021-09-04 ENCOUNTER — Other Ambulatory Visit: Payer: Self-pay | Admitting: Physician Assistant

## 2021-09-06 ENCOUNTER — Other Ambulatory Visit: Payer: Self-pay | Admitting: Physician Assistant

## 2021-09-06 ENCOUNTER — Other Ambulatory Visit: Payer: Self-pay | Admitting: Family Medicine

## 2021-09-06 DIAGNOSIS — I10 Essential (primary) hypertension: Secondary | ICD-10-CM

## 2021-09-06 DIAGNOSIS — R7303 Prediabetes: Secondary | ICD-10-CM

## 2021-09-06 MED ORDER — SODIUM CHLORIDE 0.9 % IV SOLN
2000.0000 mg | INTRAVENOUS | Status: DC
  Filled 2021-09-06: qty 20

## 2021-09-07 ENCOUNTER — Observation Stay (HOSPITAL_COMMUNITY): Payer: 59

## 2021-09-07 ENCOUNTER — Ambulatory Visit (HOSPITAL_BASED_OUTPATIENT_CLINIC_OR_DEPARTMENT_OTHER): Payer: 59 | Admitting: Anesthesiology

## 2021-09-07 ENCOUNTER — Observation Stay (HOSPITAL_COMMUNITY)
Admission: RE | Admit: 2021-09-07 | Discharge: 2021-09-08 | Disposition: A | Discharge status: Home Health | Payer: 59 | Source: Ambulatory Visit | Attending: Orthopaedic Surgery | Admitting: Orthopaedic Surgery

## 2021-09-07 ENCOUNTER — Ambulatory Visit (HOSPITAL_COMMUNITY): Payer: 59 | Admitting: Physician Assistant

## 2021-09-07 ENCOUNTER — Encounter (HOSPITAL_COMMUNITY)
Admission: RE | Disposition: A | Discharge status: Home Health | Payer: Self-pay | Source: Ambulatory Visit | Attending: Orthopaedic Surgery

## 2021-09-07 ENCOUNTER — Other Ambulatory Visit: Payer: Self-pay | Admitting: Physician Assistant

## 2021-09-07 ENCOUNTER — Encounter (HOSPITAL_COMMUNITY): Payer: Self-pay | Admitting: Orthopaedic Surgery

## 2021-09-07 ENCOUNTER — Other Ambulatory Visit: Payer: Self-pay

## 2021-09-07 DIAGNOSIS — Z7982 Long term (current) use of aspirin: Secondary | ICD-10-CM | POA: Insufficient documentation

## 2021-09-07 DIAGNOSIS — N179 Acute kidney failure, unspecified: Secondary | ICD-10-CM

## 2021-09-07 DIAGNOSIS — I10 Essential (primary) hypertension: Secondary | ICD-10-CM | POA: Insufficient documentation

## 2021-09-07 DIAGNOSIS — Z96651 Presence of right artificial knee joint: Secondary | ICD-10-CM

## 2021-09-07 DIAGNOSIS — Z8505 Personal history of malignant neoplasm of liver: Secondary | ICD-10-CM | POA: Diagnosis not present

## 2021-09-07 DIAGNOSIS — Z87891 Personal history of nicotine dependence: Secondary | ICD-10-CM | POA: Insufficient documentation

## 2021-09-07 DIAGNOSIS — E119 Type 2 diabetes mellitus without complications: Secondary | ICD-10-CM | POA: Diagnosis not present

## 2021-09-07 DIAGNOSIS — M1711 Unilateral primary osteoarthritis, right knee: Secondary | ICD-10-CM

## 2021-09-07 DIAGNOSIS — Z79899 Other long term (current) drug therapy: Secondary | ICD-10-CM | POA: Diagnosis not present

## 2021-09-07 DIAGNOSIS — Z7984 Long term (current) use of oral hypoglycemic drugs: Secondary | ICD-10-CM | POA: Insufficient documentation

## 2021-09-07 DIAGNOSIS — R52 Pain, unspecified: Secondary | ICD-10-CM

## 2021-09-07 HISTORY — PX: TOTAL KNEE ARTHROPLASTY: SHX125

## 2021-09-07 LAB — GLUCOSE, CAPILLARY
Glucose-Capillary: 132 mg/dL — ABNORMAL HIGH (ref 70–99)
Glucose-Capillary: 119 mg/dL — ABNORMAL HIGH (ref 70–99)

## 2021-09-07 IMAGING — CR DG KNEE 1-2V PORT*R*
2 series · 2 of 2 positions shown · non-contrast
Comparison: [DATE]

CLINICAL DATA: Status post right knee arthroplasty

EXAM:
PORTABLE RIGHT KNEE - 1-2 VIEW

[AP]
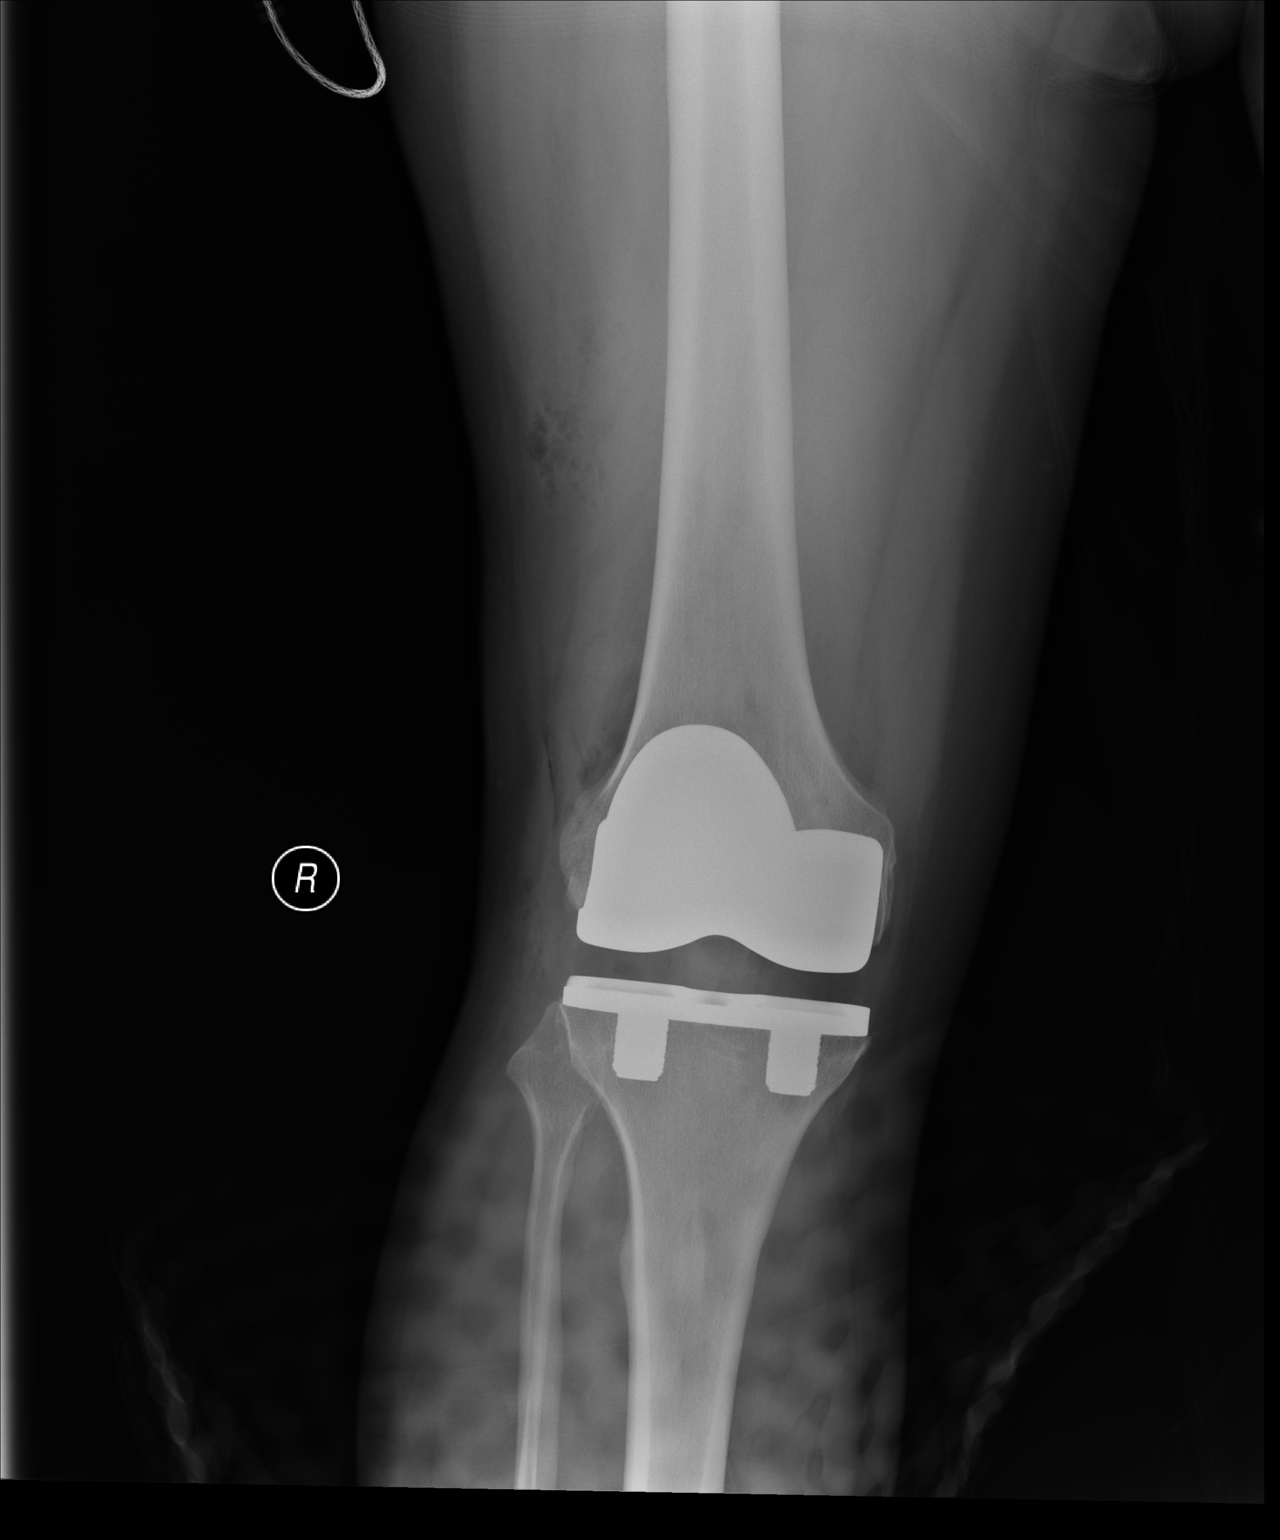

[xtable lateral]
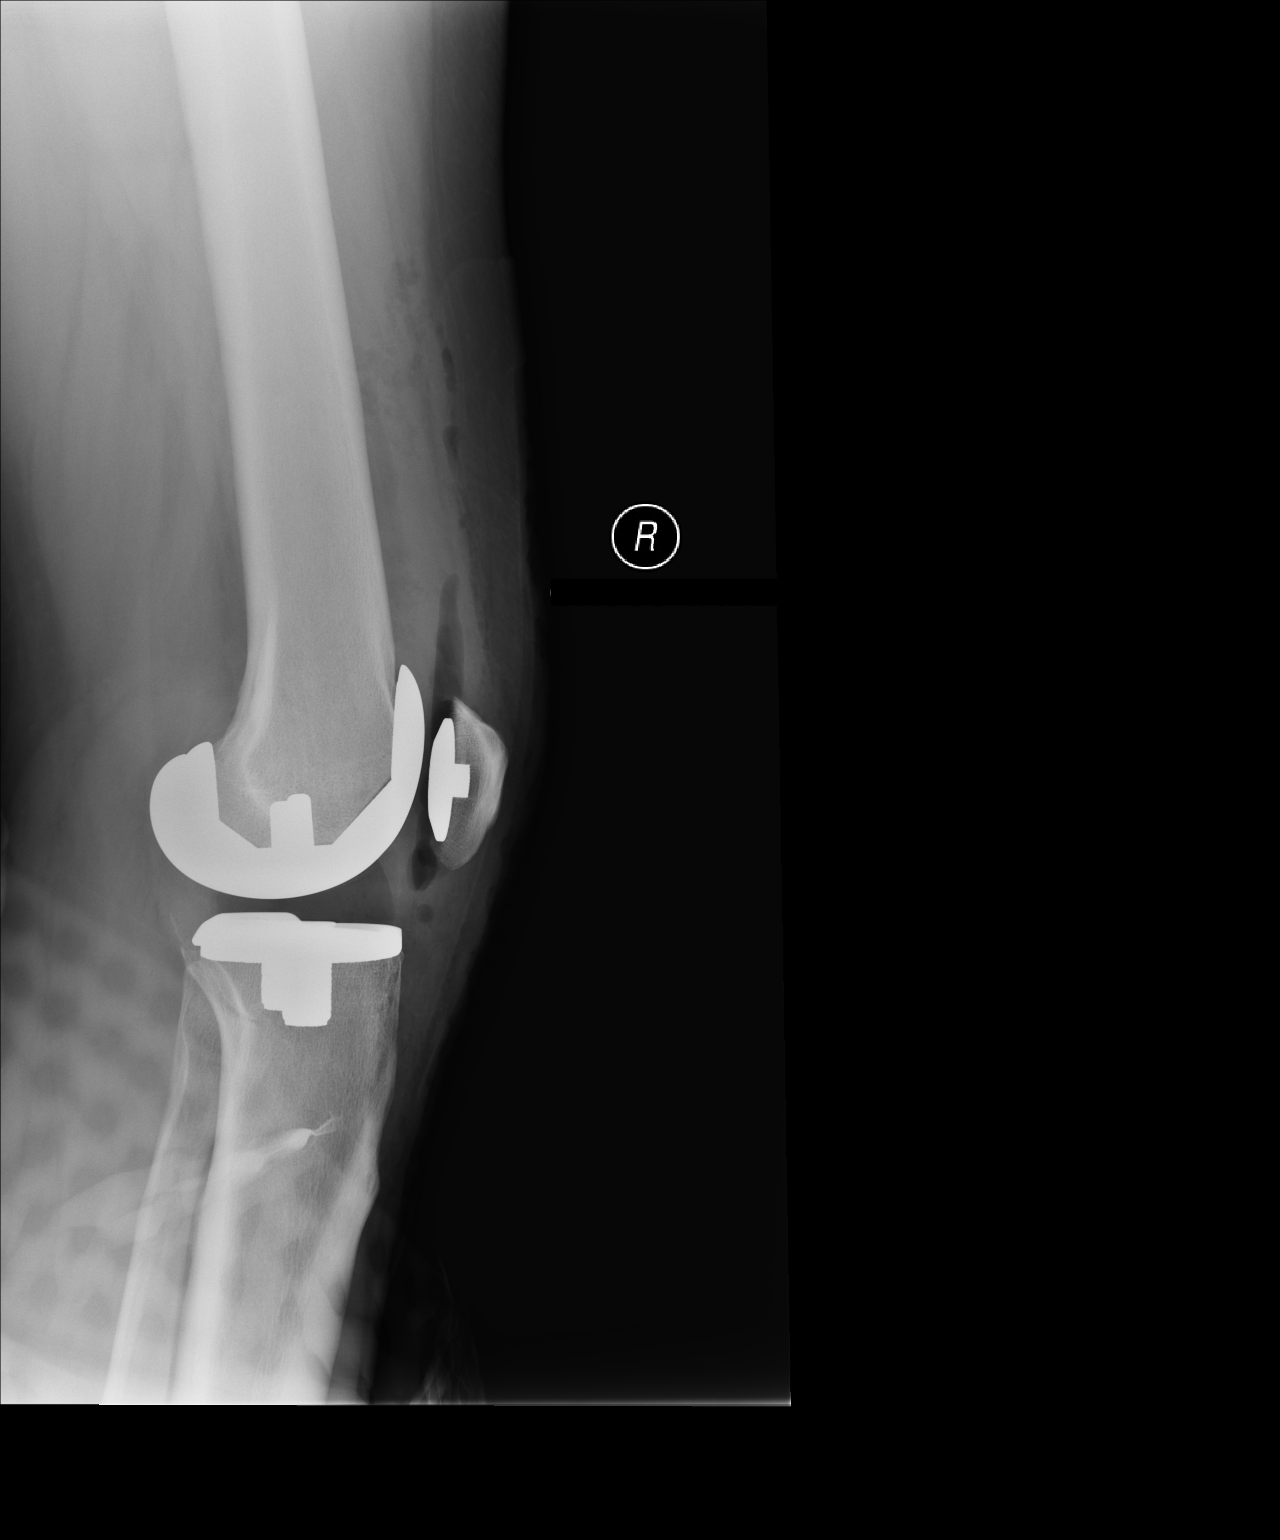

[2 of 2 positions shown; findings below may reference images not displayed]

FINDINGS: There is interval right knee arthroplasty. No fracture or
dislocation is seen. There are pockets of air in the soft tissues
and in the suprapatellar bursa related to recent surgery.
IMPRESSION: Status post right knee arthroplasty.

## 2021-09-07 SURGERY — ARTHROPLASTY, KNEE, TOTAL
Anesthesia: Spinal | Site: Knee | Laterality: Right

## 2021-09-07 MED ORDER — INSULIN ASPART 100 UNIT/ML IJ SOLN: 0.0000 [IU] | INTRAMUSCULAR | Status: DC | PRN

## 2021-09-07 MED ORDER — METFORMIN HCL 500 MG PO TABS
500.0000 mg | ORAL_TABLET | Freq: Every day | ORAL | Status: DC
Start: 2021-09-08 — End: 2021-09-08
  Administered 2021-09-08: 500 mg via ORAL
  Filled 2021-09-07: qty 1

## 2021-09-07 MED ORDER — DEXAMETHASONE SODIUM PHOSPHATE 4 MG/ML IJ SOLN
INTRAMUSCULAR | Status: DC | PRN
  Administered 2021-09-07: 5 mg via PERINEURAL

## 2021-09-07 MED ORDER — CARVEDILOL 25 MG PO TABS
25.0000 mg | ORAL_TABLET | Freq: Two times a day (BID) | ORAL | Status: DC
  Administered 2021-09-07 – 2021-09-08 (×2): 25 mg via ORAL
  Filled 2021-09-07 (×2): qty 1

## 2021-09-07 MED ORDER — ONDANSETRON HCL 4 MG PO TABS: 4.0000 mg | ORAL_TABLET | Freq: Four times a day (QID) | ORAL | Status: DC | PRN

## 2021-09-07 MED ORDER — ACETAMINOPHEN 325 MG PO TABS: 325.0000 mg | ORAL_TABLET | Freq: Four times a day (QID) | ORAL | Status: DC | PRN

## 2021-09-07 MED ORDER — TRANEXAMIC ACID-NACL 1000-0.7 MG/100ML-% IV SOLN
1000.0000 mg | Freq: Once | INTRAVENOUS | Status: AC
  Administered 2021-09-07: 1000 mg via INTRAVENOUS
  Filled 2021-09-07: qty 100

## 2021-09-07 MED ORDER — CHLORHEXIDINE GLUCONATE 0.12 % MT SOLN
OROMUCOSAL | Status: AC
  Administered 2021-09-07: 15 mL via OROMUCOSAL
  Filled 2021-09-07: qty 15

## 2021-09-07 MED ORDER — PROPOFOL 500 MG/50ML IV EMUL
INTRAVENOUS | Status: DC | PRN
  Administered 2021-09-07: 30 ug/kg/min via INTRAVENOUS

## 2021-09-07 MED ORDER — SULFAMETHOXAZOLE-TRIMETHOPRIM 800-160 MG PO TABS: 1.0000 | ORAL_TABLET | Freq: Two times a day (BID) | ORAL | 0 refills | Status: DC

## 2021-09-07 MED ORDER — TRANEXAMIC ACID-NACL 1000-0.7 MG/100ML-% IV SOLN
1000.0000 mg | INTRAVENOUS | Status: AC
Start: 2021-09-07 — End: 2021-09-07
  Administered 2021-09-07: 1000 mg via INTRAVENOUS

## 2021-09-07 MED ORDER — INSULIN ASPART 100 UNIT/ML IJ SOLN: 0.0000 [IU] | Freq: Three times a day (TID) | INTRAMUSCULAR | Status: DC

## 2021-09-07 MED ORDER — PHENOL 1.4 % MT LIQD: 1.0000 | OROMUCOSAL | Status: DC | PRN

## 2021-09-07 MED ORDER — ACETAMINOPHEN 500 MG PO TABS
1000.0000 mg | ORAL_TABLET | Freq: Once | ORAL | Status: AC
  Administered 2021-09-07: 1000 mg via ORAL
  Filled 2021-09-07: qty 2

## 2021-09-07 MED ORDER — TRANEXAMIC ACID-NACL 1000-0.7 MG/100ML-% IV SOLN
INTRAVENOUS | Status: AC
  Filled 2021-09-07: qty 100

## 2021-09-07 MED ORDER — IRBESARTAN 150 MG PO TABS
300.0000 mg | ORAL_TABLET | Freq: Every day | ORAL | Status: DC
  Administered 2021-09-07: 300 mg via ORAL
  Filled 2021-09-07: qty 2

## 2021-09-07 MED ORDER — HYDROMORPHONE HCL 1 MG/ML IJ SOLN
INTRAMUSCULAR | Status: AC
  Filled 2021-09-07: qty 1

## 2021-09-07 MED ORDER — OXYCODONE HCL ER 10 MG PO T12A: 10.0000 mg | EXTENDED_RELEASE_TABLET | Freq: Two times a day (BID) | ORAL | Status: DC

## 2021-09-07 MED ORDER — DOCUSATE SODIUM 100 MG PO CAPS
100.0000 mg | ORAL_CAPSULE | Freq: Two times a day (BID) | ORAL | Status: DC
  Administered 2021-09-07: 100 mg via ORAL
  Filled 2021-09-07: qty 1

## 2021-09-07 MED ORDER — DEXAMETHASONE SODIUM PHOSPHATE 10 MG/ML IJ SOLN
INTRAMUSCULAR | Status: DC | PRN
  Administered 2021-09-07: 8 mg via INTRAVENOUS

## 2021-09-07 MED ORDER — LACTATED RINGERS IV SOLN: INTRAVENOUS | Status: DC

## 2021-09-07 MED ORDER — VANCOMYCIN HCL 1000 MG IV SOLR
INTRAVENOUS | Status: AC
  Filled 2021-09-07: qty 20

## 2021-09-07 MED ORDER — CEFAZOLIN SODIUM-DEXTROSE 2-4 GM/100ML-% IV SOLN
2.0000 g | INTRAVENOUS | Status: AC
  Administered 2021-09-07: 2 g via INTRAVENOUS

## 2021-09-07 MED ORDER — ASPIRIN 81 MG PO CHEW
81.0000 mg | CHEWABLE_TABLET | Freq: Two times a day (BID) | ORAL | Status: DC
  Administered 2021-09-07: 81 mg via ORAL
  Filled 2021-09-07: qty 1

## 2021-09-07 MED ORDER — SODIUM CHLORIDE 0.9 % IR SOLN
Status: DC | PRN
  Administered 2021-09-07: 1000 mL

## 2021-09-07 MED ORDER — STERILE WATER FOR IRRIGATION IR SOLN
Status: DC | PRN
  Administered 2021-09-07: 1000 mL

## 2021-09-07 MED ORDER — METOCLOPRAMIDE HCL 5 MG/ML IJ SOLN: 5.0000 mg | Freq: Three times a day (TID) | INTRAMUSCULAR | Status: DC | PRN

## 2021-09-07 MED ORDER — ONDANSETRON HCL 4 MG/2ML IJ SOLN
INTRAMUSCULAR | Status: AC
  Filled 2021-09-07: qty 2

## 2021-09-07 MED ORDER — HYDROMORPHONE HCL 1 MG/ML IJ SOLN
0.2500 mg | INTRAMUSCULAR | Status: DC | PRN
  Administered 2021-09-07 (×2): 0.5 mg via INTRAVENOUS

## 2021-09-07 MED ORDER — DEXAMETHASONE SODIUM PHOSPHATE 10 MG/ML IJ SOLN: 10.0000 mg | Freq: Once | INTRAMUSCULAR | Status: DC

## 2021-09-07 MED ORDER — MIDAZOLAM HCL 2 MG/2ML IJ SOLN
INTRAMUSCULAR | Status: AC
  Filled 2021-09-07: qty 2

## 2021-09-07 MED ORDER — PHENYLEPHRINE HCL-NACL 20-0.9 MG/250ML-% IV SOLN
INTRAVENOUS | Status: DC | PRN
  Administered 2021-09-07: 20 ug/min via INTRAVENOUS

## 2021-09-07 MED ORDER — PROMETHAZINE HCL 25 MG/ML IJ SOLN: 6.2500 mg | INTRAMUSCULAR | Status: DC | PRN

## 2021-09-07 MED ORDER — MEPERIDINE HCL 25 MG/ML IJ SOLN: 6.2500 mg | INTRAMUSCULAR | Status: DC | PRN

## 2021-09-07 MED ORDER — PHENYLEPHRINE 40 MCG/ML (10ML) SYRINGE FOR IV PUSH (FOR BLOOD PRESSURE SUPPORT)
PREFILLED_SYRINGE | INTRAVENOUS | Status: AC
  Filled 2021-09-07: qty 10

## 2021-09-07 MED ORDER — BUPIVACAINE-MELOXICAM ER 400-12 MG/14ML IJ SOLN
INTRAMUSCULAR | Status: DC | PRN
  Administered 2021-09-07: 14 mL

## 2021-09-07 MED ORDER — ONDANSETRON HCL 4 MG/2ML IJ SOLN
INTRAMUSCULAR | Status: DC | PRN
  Administered 2021-09-07: 4 mg via INTRAVENOUS

## 2021-09-07 MED ORDER — 0.9 % SODIUM CHLORIDE (POUR BTL) OPTIME
TOPICAL | Status: DC | PRN
  Administered 2021-09-07: 1000 mL

## 2021-09-07 MED ORDER — PROPOFOL 10 MG/ML IV BOLUS
INTRAVENOUS | Status: AC
  Filled 2021-09-07: qty 20

## 2021-09-07 MED ORDER — KETOROLAC TROMETHAMINE 15 MG/ML IJ SOLN
15.0000 mg | Freq: Four times a day (QID) | INTRAMUSCULAR | Status: DC
  Administered 2021-09-07 – 2021-09-08 (×3): 15 mg via INTRAVENOUS
  Filled 2021-09-07 (×3): qty 1

## 2021-09-07 MED ORDER — AMLODIPINE BESYLATE 5 MG PO TABS: 10.0000 mg | ORAL_TABLET | Freq: Every day | ORAL | Status: DC

## 2021-09-07 MED ORDER — INSULIN ASPART 100 UNIT/ML IJ SOLN: 0.0000 [IU] | Freq: Every day | INTRAMUSCULAR | Status: DC

## 2021-09-07 MED ORDER — METHOCARBAMOL 500 MG PO TABS
500.0000 mg | ORAL_TABLET | Freq: Four times a day (QID) | ORAL | Status: DC | PRN
  Administered 2021-09-07: 500 mg via ORAL
  Filled 2021-09-07: qty 1

## 2021-09-07 MED ORDER — MIDAZOLAM HCL 2 MG/2ML IJ SOLN: 1.0000 mg | Freq: Once | INTRAMUSCULAR | Status: AC

## 2021-09-07 MED ORDER — METHOCARBAMOL 1000 MG/10ML IJ SOLN
500.0000 mg | Freq: Four times a day (QID) | INTRAVENOUS | Status: DC | PRN
  Filled 2021-09-07: qty 5

## 2021-09-07 MED ORDER — DEXAMETHASONE SODIUM PHOSPHATE 10 MG/ML IJ SOLN
INTRAMUSCULAR | Status: AC
  Filled 2021-09-07: qty 1

## 2021-09-07 MED ORDER — FENTANYL CITRATE (PF) 100 MCG/2ML IJ SOLN: 50.0000 ug | Freq: Once | INTRAMUSCULAR | Status: AC

## 2021-09-07 MED ORDER — HYDROMORPHONE HCL 1 MG/ML IJ SOLN: 0.5000 mg | INTRAMUSCULAR | Status: DC | PRN

## 2021-09-07 MED ORDER — MIDAZOLAM HCL 2 MG/2ML IJ SOLN
INTRAMUSCULAR | Status: DC | PRN
Start: 2021-09-07 — End: 2021-09-07
  Administered 2021-09-07 (×2): 1 mg via INTRAVENOUS

## 2021-09-07 MED ORDER — METOCLOPRAMIDE HCL 5 MG PO TABS: 5.0000 mg | ORAL_TABLET | Freq: Three times a day (TID) | ORAL | Status: DC | PRN

## 2021-09-07 MED ORDER — CEFAZOLIN SODIUM-DEXTROSE 2-4 GM/100ML-% IV SOLN
2.0000 g | Freq: Four times a day (QID) | INTRAVENOUS | Status: AC
  Administered 2021-09-07 (×2): 2 g via INTRAVENOUS
  Filled 2021-09-07 (×2): qty 100

## 2021-09-07 MED ORDER — MENTHOL 3 MG MT LOZG: 1.0000 | LOZENGE | OROMUCOSAL | Status: DC | PRN

## 2021-09-07 MED ORDER — SODIUM CHLORIDE 0.9 % IV SOLN: INTRAVENOUS | Status: DC

## 2021-09-07 MED ORDER — IRRISEPT - 450ML BOTTLE WITH 0.05% CHG IN STERILE WATER, USP 99.95% OPTIME
TOPICAL | Status: DC | PRN
  Administered 2021-09-07: 450 mL via TOPICAL

## 2021-09-07 MED ORDER — FENTANYL CITRATE (PF) 100 MCG/2ML IJ SOLN
INTRAMUSCULAR | Status: AC
  Administered 2021-09-07: 50 ug via INTRAVENOUS
  Filled 2021-09-07: qty 2

## 2021-09-07 MED ORDER — ACETAMINOPHEN 500 MG PO TABS
1000.0000 mg | ORAL_TABLET | Freq: Four times a day (QID) | ORAL | Status: DC
  Administered 2021-09-07 – 2021-09-08 (×3): 1000 mg via ORAL
  Filled 2021-09-07 (×3): qty 2

## 2021-09-07 MED ORDER — BUPIVACAINE-MELOXICAM ER 400-12 MG/14ML IJ SOLN
INTRAMUSCULAR | Status: AC
  Filled 2021-09-07: qty 1

## 2021-09-07 MED ORDER — PROPOFOL 10 MG/ML IV BOLUS
INTRAVENOUS | Status: DC | PRN
  Administered 2021-09-07 (×2): 10 mg via INTRAVENOUS

## 2021-09-07 MED ORDER — OXYCODONE HCL 5 MG PO TABS: 10.0000 mg | ORAL_TABLET | ORAL | Status: DC | PRN

## 2021-09-07 MED ORDER — CHLORHEXIDINE GLUCONATE 0.12 % MT SOLN: 15.0000 mL | Freq: Once | OROMUCOSAL | Status: AC

## 2021-09-07 MED ORDER — PHENYLEPHRINE HCL (PRESSORS) 10 MG/ML IV SOLN
INTRAVENOUS | Status: DC | PRN
Start: 2021-09-07 — End: 2021-09-07
  Administered 2021-09-07: 40 ug via INTRAVENOUS

## 2021-09-07 MED ORDER — FENTANYL CITRATE (PF) 250 MCG/5ML IJ SOLN
INTRAMUSCULAR | Status: AC
  Filled 2021-09-07: qty 5

## 2021-09-07 MED ORDER — VANCOMYCIN HCL 1000 MG IV SOLR
INTRAVENOUS | Status: DC | PRN
  Administered 2021-09-07: 1000 mg via TOPICAL

## 2021-09-07 MED ORDER — ROPIVACAINE HCL 7.5 MG/ML IJ SOLN
INTRAMUSCULAR | Status: DC | PRN
  Administered 2021-09-07: 20 mL via PERINEURAL

## 2021-09-07 MED ORDER — CLONIDINE HCL (ANALGESIA) 100 MCG/ML EP SOLN
EPIDURAL | Status: DC | PRN
  Administered 2021-09-07: 80 ug

## 2021-09-07 MED ORDER — ORAL CARE MOUTH RINSE: 15.0000 mL | Freq: Once | OROMUCOSAL | Status: AC

## 2021-09-07 MED ORDER — OXYCODONE HCL 5 MG PO TABS: 5.0000 mg | ORAL_TABLET | ORAL | Status: DC | PRN

## 2021-09-07 MED ORDER — SODIUM CHLORIDE 0.9 % IV SOLN
INTRAVENOUS | Status: DC | PRN
  Administered 2021-09-07: 2000 mg via TOPICAL

## 2021-09-07 MED ORDER — CELECOXIB 200 MG PO CAPS
200.0000 mg | ORAL_CAPSULE | Freq: Once | ORAL | Status: AC
  Administered 2021-09-07: 200 mg via ORAL
  Filled 2021-09-07: qty 1

## 2021-09-07 MED ORDER — CEFAZOLIN SODIUM-DEXTROSE 2-4 GM/100ML-% IV SOLN
INTRAVENOUS | Status: AC
  Filled 2021-09-07: qty 100

## 2021-09-07 MED ORDER — MIDAZOLAM HCL 2 MG/2ML IJ SOLN
INTRAMUSCULAR | Status: AC
  Administered 2021-09-07: 1 mg via INTRAVENOUS
  Filled 2021-09-07: qty 2

## 2021-09-07 MED ORDER — ONDANSETRON HCL 4 MG/2ML IJ SOLN: 4.0000 mg | Freq: Four times a day (QID) | INTRAMUSCULAR | Status: DC | PRN

## 2021-09-07 MED ORDER — FENTANYL CITRATE (PF) 250 MCG/5ML IJ SOLN
INTRAMUSCULAR | Status: DC | PRN
  Administered 2021-09-07: 50 ug via INTRAVENOUS

## 2021-09-07 SURGICAL SUPPLY — 73 items
ALCOHOL 70% 16 OZ (MISCELLANEOUS) ×2 IMPLANT
BAG COUNTER SPONGE SURGICOUNT (BAG) IMPLANT
BAG DECANTER FOR FLEXI CONT (MISCELLANEOUS) ×2 IMPLANT
BLADE SAG 18X100X1.27 (BLADE) ×2 IMPLANT
BOWL SMART MIX CTS (DISPOSABLE) ×1 IMPLANT
CLSR STERI-STRIP ANTIMIC 1/2X4 (GAUZE/BANDAGES/DRESSINGS) ×4 IMPLANT
COMP FEM CR PERS SZ 9 RT (Joint) ×2 IMPLANT
COMP PATELLAR 10X35 METAL (Joint) ×2 IMPLANT
COMPONENT FEM CR PERS SZ 9 RT (Joint) IMPLANT
COMPONENT PATELLAR 10X35 METAL (Joint) IMPLANT
COOLER ICEMAN CLASSIC (MISCELLANEOUS) ×2 IMPLANT
COVER SURGICAL LIGHT HANDLE (MISCELLANEOUS) ×2 IMPLANT
CUFF TOURN SGL QUICK 34 (TOURNIQUET CUFF) ×2
CUFF TRNQT CYL 34X4.125X (TOURNIQUET CUFF) ×1 IMPLANT
DERMABOND ADVANCED (GAUZE/BANDAGES/DRESSINGS) ×1
DERMABOND ADVANCED .7 DNX12 (GAUZE/BANDAGES/DRESSINGS) ×1 IMPLANT
DRAPE EXTREMITY T 121X128X90 (DISPOSABLE) ×2 IMPLANT
DRAPE HALF SHEET 40X57 (DRAPES) ×2 IMPLANT
DRAPE INCISE IOBAN 66X45 STRL (DRAPES) ×2 IMPLANT
DRAPE ORTHO SPLIT 77X108 STRL (DRAPES) ×4
DRAPE POUCH INSTRU U-SHP 10X18 (DRAPES) ×2 IMPLANT
DRAPE SURG ORHT 6 SPLT 77X108 (DRAPES) ×2 IMPLANT
DRAPE U-SHAPE 47X51 STRL (DRAPES) ×4 IMPLANT
DRSG AQUACEL AG ADV 3.5X10 (GAUZE/BANDAGES/DRESSINGS) ×2 IMPLANT
DURAPREP 26ML APPLICATOR (WOUND CARE) ×6 IMPLANT
ELECT CAUTERY BLADE 6.4 (BLADE) ×2 IMPLANT
ELECT REM PT RETURN 9FT ADLT (ELECTROSURGICAL) ×2
ELECTRODE REM PT RTRN 9FT ADLT (ELECTROSURGICAL) ×1 IMPLANT
GLOVE BIOGEL PI IND STRL 7.5 (GLOVE) ×4 IMPLANT
GLOVE BIOGEL PI INDICATOR 7.5 (GLOVE) ×4
GLOVE SURG UNDER LTX SZ7.5 (GLOVE) ×4 IMPLANT
GLOVE SURG UNDER POLY LF SZ7 (GLOVE) ×6 IMPLANT
GOWN STRL REIN XL XLG (GOWN DISPOSABLE) ×2 IMPLANT
GOWN STRL REUS W/ TWL LRG LVL3 (GOWN DISPOSABLE) ×1 IMPLANT
GOWN STRL REUS W/TWL LRG LVL3 (GOWN DISPOSABLE) ×2
HANDPIECE INTERPULSE COAX TIP (DISPOSABLE) ×2
HDLS TROCR DRIL PIN KNEE 75 (PIN) ×6
HOOD PEEL AWAY FLYTE STAYCOOL (MISCELLANEOUS) ×4 IMPLANT
INSERT TIB ASF SZ8-11 14 RT (Insert) ×1 IMPLANT
JET LAVAGE IRRISEPT WOUND (IRRIGATION / IRRIGATOR) ×2
KIT BASIN OR (CUSTOM PROCEDURE TRAY) ×2 IMPLANT
KIT TURNOVER KIT B (KITS) ×2 IMPLANT
LAVAGE JET IRRISEPT WOUND (IRRIGATION / IRRIGATOR) ×1 IMPLANT
MANIFOLD NEPTUNE II (INSTRUMENTS) ×2 IMPLANT
MARKER SKIN DUAL TIP RULER LAB (MISCELLANEOUS) ×4 IMPLANT
NDL SPNL 18GX3.5 QUINCKE PK (NEEDLE) ×1 IMPLANT
NEEDLE SPNL 18GX3.5 QUINCKE PK (NEEDLE) ×2 IMPLANT
NS IRRIG 1000ML POUR BTL (IV SOLUTION) ×2 IMPLANT
PACK TOTAL JOINT (CUSTOM PROCEDURE TRAY) ×2 IMPLANT
PAD ARMBOARD 7.5X6 YLW CONV (MISCELLANEOUS) ×4 IMPLANT
PAD COLD SHLDR WRAP-ON (PAD) ×2 IMPLANT
PIN DRILL HDLS TROCAR 75 4PK (PIN) IMPLANT
SAW OSC TIP CART 19.5X105X1.3 (SAW) ×2 IMPLANT
SCREW FEMALE HEX FIX 25X2.5 (ORTHOPEDIC DISPOSABLE SUPPLIES) ×1 IMPLANT
SET HNDPC FAN SPRY TIP SCT (DISPOSABLE) ×1 IMPLANT
STAPLER VISISTAT 35W (STAPLE) IMPLANT
STEM TIBIAL TRAB SZF RT (Stem) ×1 IMPLANT
SUCTION FRAZIER HANDLE 10FR (MISCELLANEOUS) ×2
SUCTION TUBE FRAZIER 10FR DISP (MISCELLANEOUS) ×1 IMPLANT
SUT ETHILON 2 0 FS 18 (SUTURE) ×3 IMPLANT
SUT MNCRL AB 3-0 PS2 27 (SUTURE) IMPLANT
SUT VIC AB 0 CT1 27 (SUTURE) ×4
SUT VIC AB 0 CT1 27XBRD ANBCTR (SUTURE) ×2 IMPLANT
SUT VIC AB 1 CTX 27 (SUTURE) ×6 IMPLANT
SUT VIC AB 1 CTX 36 (SUTURE) ×2
SUT VIC AB 1 CTX36XBRD ANBCTR (SUTURE) IMPLANT
SUT VIC AB 2-0 CT1 27 (SUTURE) ×10
SUT VIC AB 2-0 CT1 TAPERPNT 27 (SUTURE) ×4 IMPLANT
SYR 50ML LL SCALE MARK (SYRINGE) ×4 IMPLANT
TOWEL GREEN STERILE (TOWEL DISPOSABLE) ×2 IMPLANT
TOWEL GREEN STERILE FF (TOWEL DISPOSABLE) ×2 IMPLANT
UNDERPAD 30X36 HEAVY ABSORB (UNDERPADS AND DIAPERS) ×2 IMPLANT
YANKAUER SUCT BULB TIP NO VENT (SUCTIONS) ×4 IMPLANT

## 2021-09-07 NOTE — Anesthesia Procedure Notes (Signed)
Anesthesia Regional Block: Adductor canal block   Pre-Anesthetic Checklist: , timeout performed,  Correct Patient, Correct Site, Correct Laterality,  Correct Procedure, Correct Position, site marked,  Risks and benefits discussed,  Surgical consent,  Pre-op evaluation,  At surgeon's request and post-op pain management  Laterality: Lower and Right  Prep: chloraprep       Needles:  Injection technique: Single-shot  Needle Type: Stimiplex     Needle Length: 9cm  Needle Gauge: 21     Additional Needles:   Procedures:,,,, ultrasound used (permanent image in chart),,    Narrative:  Start time: 09/07/2021 10:45 AM End time: 09/07/2021 11:05 AM Injection made incrementally with aspirations every 5 mL.  Performed by: Personally  Anesthesiologist: Nolon Nations, MD  Additional Notes: BP cuff, EKG monitors applied. Sedation begun. Artery and nerve location verified with ultrasound. Anesthetic injected incrementally (54ml), slowly, and after negative aspirations under direct u/s guidance. Good fascial/perineural spread. Tolerated well.

## 2021-09-07 NOTE — Anesthesia Preprocedure Evaluation (Addendum)
Anesthesia Evaluation  Patient identified by MRN, date of birth, ID band Patient awake    Reviewed: Allergy & Precautions, NPO status , Patient's Chart, lab work & pertinent test results  History of Anesthesia Complications Negative for: history of anesthetic complications  Airway Mallampati: III  TM Distance: >3 FB Neck ROM: Full    Dental  (+) Dental Advisory Given,    Pulmonary sleep apnea ,    Pulmonary exam normal breath sounds clear to auscultation       Cardiovascular hypertension, Pt. on medications and Pt. on home beta blockers Normal cardiovascular exam Rhythm:Regular Rate:Normal     Neuro/Psych negative neurological ROS  negative psych ROS   GI/Hepatic Neg liver ROS, Small bowel obstruction   Endo/Other  diabetes  Renal/GU ARFRenal disease (Cr 1.27)     Musculoskeletal  (+) Arthritis , Rheumatoid disorders,    Abdominal (+) + obese,   Peds  Hematology negative hematology ROS (+)   Anesthesia Other Findings Day of surgery medications reviewed with patient.  Reproductive/Obstetrics                           Anesthesia Physical  Anesthesia Plan  ASA: 3  Anesthesia Plan: Spinal   Post-op Pain Management: Tylenol PO (pre-op)*, Celebrex PO (pre-op)* and Regional block*   Induction: Intravenous  PONV Risk Score and Plan: 4 or greater and Treatment may vary due to age or medical condition, Ondansetron, Dexamethasone, Propofol infusion and Midazolam  Airway Management Planned: Natural Airway  Additional Equipment:   Intra-op Plan:   Post-operative Plan:   Informed Consent: I have reviewed the patients History and Physical, chart, labs and discussed the procedure including the risks, benefits and alternatives for the proposed anesthesia with the patient or authorized representative who has indicated his/her understanding and acceptance.     Dental advisory given  Plan  Discussed with: CRNA  Anesthesia Plan Comments:       Anesthesia Quick Evaluation

## 2021-09-07 NOTE — TOC Progression Note (Signed)
Transition of Care [facility]) - Progression Note    Patient Details  Name: Dwayne Sawyer MRN: [id number] Date of Birth: [birth year]  [facility]) CM/SW Contact  Jacalyn Lefevre Edson Snowball, RN Phone Number: 09/07/2021, 4:33 PM  Clinical Narrative:     Dr Erlinda Hong office arranged home health services through [facility].   3C staff will provide any DME needed.     Transition of Care [facility]) Screening Note   Patient Details  Name: Dwayne Sawyer Date of Birth: [birth year]      Transition of Care Department [facility]) has reviewed patient and no TOC needs have been identified at this time. We will continue to monitor patient advancement through interdisciplinary progression rounds. If new patient transition needs arise, please place a TOC consult.         Expected Discharge Plan and Services                                                 Social Determinants of Health (SDOH) Interventions    Readmission Risk Interventions No flowsheet data found.

## 2021-09-07 NOTE — Anesthesia Procedure Notes (Addendum)
Spinal  Patient location during procedure: OR Start time: 09/07/2021 11:39 AM End time: 09/07/2021 11:44 AM Reason for block: surgical anesthesia Staffing Performed: anesthesiologist  Anesthesiologist: Nolon Nations, MD Preanesthetic Checklist Completed: patient identified, IV checked, site marked, risks and benefits discussed, surgical consent, monitors and equipment checked, pre-op evaluation and timeout performed Spinal Block Prep: DuraPrep and site prepped and draped Patient monitoring: heart rate, continuous pulse ox and blood pressure Approach: midline Location: L3-4 Injection technique: single-shot Needle Needle type: Spinocan  Needle gauge: 25 G Needle length: 9 cm Additional Notes Expiration date of kit checked and confirmed. Patient tolerated procedure well, without complications.

## 2021-09-07 NOTE — Op Note (Signed)
Total Knee Arthroplasty Procedure Note  Preoperative diagnosis: Right knee osteoarthritis  Postoperative diagnosis:same  Operative procedure: Right total knee arthroplasty. CPT [phone]  Surgeon: N. Eduard Roux, MD  Assist: Madalyn Rob, PA-C; necessary for the timely completion of procedure and due to complexity of procedure.  Anesthesia: Spinal, regional  Tourniquet time: less than 60 mins  Implants used: Zimmer persona pressfit Femur: CR 9 Tibia: F Patella: 35 mm Polyethylene: 14 mm, MC  Indication: Dwayne Sawyer is a 51 y.o. year old adult with a history of knee pain. Having failed conservative management, the patient elected to proceed with a total knee arthroplasty.  We have reviewed the risk and benefits of the surgery and they elected to proceed after voicing understanding.  Procedure:  After informed consent was obtained and understanding of the risk were voiced including but not limited to bleeding, infection, damage to surrounding structures including nerves and vessels, blood clots, leg length inequality and the failure to achieve desired results, the operative extremity was marked with verbal confirmation of the patient in the holding area.   The patient was then brought to the operating room and transported to the operating room table in the supine position.  A tourniquet was applied to the operative extremity around the upper thigh. The operative limb was then prepped and draped in the usual sterile fashion and preoperative antibiotics were administered.  A time out was performed prior to the start of surgery confirming the correct extremity, preoperative antibiotic administration, as well as team members, implants and instruments available for the case. Correct surgical site was also confirmed with preoperative radiographs. The limb was then elevated for exsanguination and the tourniquet was inflated. A midline incision was made and a standard medial parapatellar  approach was performed.  The patella was prepared and sized to a 35 mm.  A cover was placed on the patella for protection from retractors.  We then turned our attention to the femur. Posterior cruciate ligament was sacrificed. Start site was drilled in the femur and the intramedullary distal femoral cutting guide was placed, set at 5 degrees valgus, taking 10 mm of distal resection. The distal cut was made. Osteophytes were then removed.   Next, the proximal tibial cutting guide was placed with appropriate slope, varus/valgus alignment and depth of resection. The proximal tibial cut was made taking 4 mm off the low side below the sclerotic bone. Gap blocks were then used to assess the extension gap and alignment, and appropriate soft tissue releases were performed. Attention was turned back to the femur, which was sized using the sizing guide to a size 9. Appropriate rotation of the femoral component was determined using epicondylar axis, Whitesides line, and assessing the flexion gap under ligament tension. The appropriate size 4-in-1 cutting block was placed and cuts were made.  Posterior femoral osteophytes and uncapped bone were then removed with the curved osteotome.  Trial components were placed, and stability was checked in full extension, mid-flexion, and deep flexion. Proper tibial rotation was determined and marked.  The patella tracked well without a lateral release. Trial components were then removed and tibial preparation performed.  The tibia was sized for a size F component.  The bony surfaces were irrigated with a pulse lavage and then dried. The stability of the construct was re-evaluated throughout a range of motion and found to be acceptable. The trial liner was removed, the knee was copiously irrigated, and the knee was re-evaluated for any excess bone debris. The real  polyethylene liner, 14 mm thick, was inserted and checked to ensure the locking mechanism had engaged appropriately. The  tourniquet was deflated and hemostasis was achieved. The wound was irrigated with normal saline.  One gram of vancomycin powder was placed in the surgical bed.  Topical 0.25% bupivacaine and meloxicam was placed in the joint for postoperative pain.  Capsular closure was performed with a #1 vicryl, subcutaneous fat closed with a 0 vicryl suture, then subcutaneous tissue closed with interrupted 2.0 vicryl suture. The skin was then closed with a 2.0 nylon and dermabond. A sterile dressing was applied.  The patient was awakened in the operating room and taken to recovery in stable condition. All sponge, needle, and instrument counts were correct at the end of the case.  Tawanna Cooler was necessary for opening, closing, retracting, limb positioning and overall facilitation and completion of the surgery.  Position: supine  Complications: none.  Time Out: performed   Drains/Packing: none  Estimated blood loss: minimal  Returned to Recovery Room: in good condition.   Antibiotics: yes   Mechanical VTE (DVT) Prophylaxis: sequential compression devices, TED thigh-high  Chemical VTE (DVT) Prophylaxis: aspirin  Fluid Replacement  Crystalloid: see anesthesia record Blood: none  FFP: none   Specimens Removed: 1 to pathology   Sponge and Instrument Count Correct? yes   PACU: portable radiograph - knee AP and Lateral   Plan/RTC: Return in 2 weeks for wound check.   Weight Bearing/Load Lower Extremity: full   Implant Name Type Inv. Item Serial No. Manufacturer Lot No. LRB No. Used Action  COMP PATELLAR 10X35 METAL - [id number] Joint COMP PATELLAR 10X35 METAL  ZIMMER RECON(ORTH,TRAU,BIO,SG) [id number] Right 1 Implanted  INSERT TIB ASF SZ8-11 14 RT - [id number] Insert INSERT TIB ASF SZ8-11 14 RT  ZIMMER [employer]ON(ORTH,TRAU,BIO,SG) [id number] Right 1 Implanted  STEM TIBIAL TRAB SZF RT - [id number] Stem STEM TIBIAL TRAB SZF RT  ZIMMER [employer]ON(ORTH,TRAU,BIO,SG) [id number] Right 1 Implanted  COMP FEM CR PERS SZ 9  RT - [id number] Joint COMP FEM CR PERS SZ 9 RT  ZIMMER [employer]ON(ORTH,TRAU,BIO,SG) [id number] Right 1 Implanted    N. Eduard Roux, MD [facility] 1:09 PM

## 2021-09-07 NOTE — H&P (Signed)
PREOPERATIVE H&P  Chief Complaint: RIGHT KNEE DEGENERATIVE JOINT DISEASE  HPI: Dwayne Sawyer is a 51 y.o. adult who presents for surgical treatment of RIGHT KNEE DEGENERATIVE JOINT DISEASE.  He denies any changes in medical history.  Past Medical History:  Diagnosis Date   Arthritis    Cancer ([place]) 2020   liver   Diabetes mellitus without complication ([place])    type 2- pt was told he was pre-diabetic   Family history of carcinoid tumor    High cholesterol    Hypertension    Past Surgical History:  Procedure Laterality Date   EYE SURGERY Right    pt says we he was a baby surgery had to be done on one eye "because it wasn't as strong as the other"   [place]   inguinal- pt doesn't remember which side   LAPAROTOMY N/A 06/12/2019   Procedure: EXPLORATORY LAPAROTOMY WITH SMALL BOWEL RESECTION;  Surgeon: Johnathan Hausen, MD;  Location: [facility];  Service: General;  Laterality: N/A;   SHOULDER SURGERY Right 2001   rotator cuff repair   SMALL INTESTINE SURGERY  06/12/2019   part of ex lap procedure. pt states "3 feet of intestine was removed"   TONSILLECTOMY     as a child   Social History   Socioeconomic History   Marital status: Married    Spouse name: Not on file   Number of children: 3   Years of education: Not on file   Highest education level: Not on file  Occupational History   Not on file  Tobacco Use   Smoking status: Never   Smokeless tobacco: Former    Types: Chew    Quit date: 2020  Vaping Use   Vaping Use: Never used  Substance and Sexual Activity   Alcohol use: Yes    Comment: very occasionally (less than a 6 pack of beer a year)   Drug use: Never   Sexual activity: Not on file  Other Topics Concern   Not on file  Social History Narrative   Not on file   Social Determinants of Health   Financial Resource Strain: Low Risk    Difficulty of Paying Living Expenses: Not very hard  Food Insecurity: No Food Insecurity   Worried About  Charity fundraiser in the Last Year: Never true   [place] in the Last Year: Never true  Transportation Needs: No Transportation Needs   Lack of Transportation (Medical): No   Lack of Transportation (Non-Medical): No  Physical Activity: Sufficiently Active   Days of Exercise per Week: 5 days   Minutes of Exercise per Session: 60 min  Stress: No Stress Concern Present   Feeling of Stress : Only a little  Social Connections: Not on file   Family History  Problem Relation Age of Onset   Cancer Father        widespread carcinoid cancer   Hypertension Mother    Hyperlipidemia Mother    No Known Allergies Prior to Admission medications   Medication Sig Start Date End Date Taking? Authorizing Provider  aspirin EC 81 MG tablet Take 1 tablet (81 mg total) by mouth 2 (two) times daily. To be taken after surgery 08/08/21   Aundra Dubin, PA-C  atorvastatin (LIPITOR) 40 MG tablet TAKE ONE TABLET BY MOUTH DAILY AT 6:00 IN THE EVENING 03/23/21  Yes Skeet Latch, MD  carvedilol (COREG) 25 MG tablet Take 1 tablet (25 mg total) by  mouth 2 (two) times daily with a meal. 01/10/21  Yes Skeet Latch, MD  chlorthalidone (HYGROTON) 25 MG tablet Take 1 tablet (25 mg total) by mouth daily. 03/14/21  Yes [name], MD  docusate sodium (COLACE) 100 MG capsule Take 1 capsule (100 mg total) by mouth daily as needed. 08/08/21 08/08/22  Aundra Dubin, PA-C  estrogens, conjugated, (PREMARIN) 0.3 MG tablet Take 0.3-0.6 mg by mouth See admin instructions. Take 0.6 mg by mouth in the morning and 0.3 mg in the evening   Yes [provider]  methocarbamol (ROBAXIN) 500 MG tablet Take 1 tablet (500 mg total) by mouth 2 (two) times daily as needed. To be taken after surgery 08/08/21   Aundra Dubin, PA-C  ondansetron (ZOFRAN) 4 MG tablet Take 1 tablet (4 mg total) by mouth every 8 (eight) hours as needed for nausea or vomiting. 08/08/21   Aundra Dubin, PA-C  oxyCODONE-acetaminophen  (PERCOCET) 5-325 MG tablet Take 1-2 tablets by mouth every 6 (six) hours as needed. To be taken after surgery 08/08/21   Aundra Dubin, PA-C  progesterone 200 MG SUPP Place 200 mg vaginally at bedtime.   Yes [provider]  valsartan (DIOVAN) 320 MG tablet Take 1 tablet (320 mg total) by mouth daily. 03/14/21  Yes [name], MD  amLODipine (NORVASC) 10 MG tablet TAKE ONE TABLET BY MOUTH DAILY 09/06/21   [name], MD  metFORMIN (GLUCOPHAGE) 500 MG tablet TAKE ONE TABLET BY MOUTH EVERY MORNING WITH BREAKFAST 09/06/21   [name], MD     Positive ROS: All other systems have been reviewed and were otherwise negative with the exception of those mentioned in the HPI and as above.  Physical Exam: General: Alert, no acute distress Cardiovascular: No pedal edema Respiratory: No cyanosis, no use of accessory musculature GI: abdomen soft Skin: No lesions in the area of chief complaint Neurologic: Sensation intact distally Psychiatric: Patient is competent for consent with normal mood and affect Lymphatic: no lymphedema  MUSCULOSKELETAL: exam stable  Assessment: RIGHT KNEE DEGENERATIVE JOINT DISEASE  Plan: Plan for Procedure(s): RIGHT TOTAL KNEE ARTHROPLASTY  The risks benefits and alternatives were discussed with the patient including but not limited to the risks of nonoperative treatment, versus surgical intervention including infection, bleeding, nerve injury,  blood clots, cardiopulmonary complications, morbidity, mortality, among others, and they were willing to proceed.   Preoperative templating of the joint replacement has been completed, documented, and submitted to the Operating Room personnel in order to optimize intra-operative equipment management.   Eduard Roux, MD 09/07/2021 10:09 AM

## 2021-09-07 NOTE — Discharge Instructions (Signed)

## 2021-09-07 NOTE — Evaluation (Signed)
Physical Therapy Evaluation Patient Details Name: Dwayne Sawyer MRN: [id number] DOB: 02-Oct-1970 Today's Date: 09/07/2021  History of Present Illness  Pt is a 51 y.o. male who presented 09/07/21 for elective R TKA. PMH: arthritis, cancer, DM, HTN  Clinical Impression  Pt presents with condition above and deficits mentioned below, see PT Problem List. PTA, he was independent without an AD, working, driving, and living with his wife in a 2-level house with 4 STE. Pt reports his wife will be home with him to assist him 24/7 the first couple days and then transition to intermittent assistance as she returns to work. Pt plans to stay on main level of his house initially. Currently, pt is able to perform all transfers, gait bouts with either RW or crutches, and x3 stairs with handrails steadily at a min guard-supervision level. Pt reports a preference for crutches instead of the RW. Provided HEP handout for exercises s/p TKA. Will continue to follow acutely.     Recommendations for follow up therapy are one component of a multi-disciplinary discharge planning process, led by the attending physician.  Recommendations may be updated based on patient status, additional functional criteria and insurance authorization.  Follow Up Recommendations Follow physician's recommendations for discharge plan and follow up therapies    Assistance Recommended at Discharge PRN  Patient can return home with the following  A little help with bathing/dressing/bathroom;Assistance with cooking/housework;Help with stairs or ramp for entrance    Equipment Recommendations Crutches  Recommendations for Other Services  OT consult    Functional Status Assessment Patient has had a recent decline in their functional status and demonstrates the ability to make significant improvements in function in a reasonable and predictable amount of time.     Precautions / Restrictions Precautions Precautions: Fall Precaution Comments: low  fall risk Restrictions Weight Bearing Restrictions: Yes RLE Weight Bearing: Weight bearing as tolerated      Mobility  Bed Mobility Overal bed mobility: Modified Independent             General bed mobility comments: Pt able to transition supine > sit L EOB with HOB elevated without assistance.    Transfers Overall transfer level: Needs assistance Equipment used: Rolling walker (2 wheels) Transfers: Sit to/from Stand Sit to Stand: Min guard           General transfer comment: Min guard for safety. Educated pt on placement of crutches with stand > sit. No LOB    Ambulation/Gait Ambulation/Gait assistance: Min guard, Supervision Gait Distance (Feet): 400 Feet Assistive device: Rolling walker (2 wheels), Crutches Gait Pattern/deviations: Step-through pattern, Decreased stride length, Decreased step length - left, Knee flexed in stance - right, Decreased weight shift to right, Antalgic, Trunk flexed Gait velocity: reduced Gait velocity interpretation: <1.31 ft/sec, indicative of household ambulator   General Gait Details: Pt with slow, but staeady antalgic gait pattern, properly using RW or crutches. Pt reported preference for crutches. No LOB, min guard-supervision for safety.  Stairs Stairs: Yes Stairs assistance: Min guard Stair Management: One rail Left, One rail Right, Step to pattern, Forwards Number of Stairs: 3 General stair comments: Ascends with L rail leading up with L leg, descends with R rail leading down with R leg as cued. No LOB, min guard for safety.  Wheelchair Mobility    Modified Rankin (Stroke Patients Only)       Balance Overall balance assessment: Needs assistance Sitting-balance support: No upper extremity supported, Feet supported Sitting balance-Leahy Scale: Good     Standing  balance support: Bilateral upper extremity supported, No upper extremity supported, During functional activity Standing balance-Leahy Scale: Fair Standing  balance comment: Able to stand statically without UE support but benefits from bil UE support for mobility                             Pertinent Vitals/Pain Pain Assessment Pain Assessment: Faces Faces Pain Scale: Hurts little more Pain Location: R knee Pain Descriptors / Indicators: Discomfort, Operative site guarding Pain Intervention(s): Limited activity within patient's tolerance, Monitored during session, Repositioned    Home Living Family/patient expects to be discharged to:: Private residence Living Arrangements: Spouse/significant other Available Help at Discharge: Family;Available 24 hours/day (initial couple days, then wife has to return to work and can help PRN) Type of Home: House Home Access: Stairs to enter Entrance Stairs-Rails: Can reach both Entrance Stairs-Number of Steps: 4 Alternate Level Stairs-Number of Steps: flight Home Layout: Two level;1/2 bath on main level;Bed/bath upstairs;Able to live on main level with bedroom/bathroom Home Equipment: Rolling Walker (2 wheels) Additional Comments: Plans to stay on main floor initially and do sponge baths. Shower is upstairs.    Prior Function Prior Level of Function : Independent/Modified Independent;Working/employed;Driving             Mobility Comments: Does not use an AD at baseline. ADLs Comments: Ex-marine.     Hand Dominance        Extremity/Trunk Assessment   Upper Extremity Assessment Upper Extremity Assessment: Defer to OT evaluation    Lower Extremity Assessment Lower Extremity Assessment: RLE deficits/detail RLE Deficits / Details: slight numbness inferior to the knee, s/p TKA, limitation in knee flexion AROM to ~80 degrees flexion and full AROM knee extension noted RLE Sensation: decreased light touch    Cervical / Trunk Assessment Cervical / Trunk Assessment: Normal  Communication   Communication: No difficulties  Cognition Arousal/Alertness: Awake/alert Behavior During  Therapy: WFL for tasks assessed/performed Overall Cognitive Status: Within Functional Limits for tasks assessed                                          General Comments General comments (skin integrity, edema, etc.): Provided HEP handout for s/p TKA    Exercises     Assessment/Plan    PT Assessment Patient needs continued PT services  PT Problem List Decreased range of motion;Decreased activity tolerance;Decreased balance;Decreased mobility;Decreased knowledge of use of DME;Impaired sensation;Pain       PT Treatment Interventions DME instruction;Gait training;Stair training;Functional mobility training;Therapeutic activities;Therapeutic exercise;Balance training;Neuromuscular re-education;Patient/family education    PT Goals (Current goals can be found in the Care Plan section)  Acute Rehab PT Goals Patient Stated Goal: to get better PT Goal Formulation: With patient Time For Goal Achievement: 09/14/21 Potential to Achieve Goals: Good    Frequency 7X/week (BID if able)     Co-evaluation               AM-PAC PT "6 Clicks" Mobility  Outcome Measure Help needed turning from your back to your side while in a flat bed without using bedrails?: None Help needed moving from lying on your back to sitting on the side of a flat bed without using bedrails?: None Help needed moving to and from a bed to a chair (including a wheelchair)?: A Little Help needed standing up from a chair using your arms (  e.g., wheelchair or bedside chair)?: A Little Help needed to walk in hospital room?: A Little Help needed climbing 3-5 steps with a railing? : A Little 6 Click Score: 20    End of Session Equipment Utilized During Treatment: Gait belt Activity Tolerance: Patient tolerated treatment well Patient left: in chair;with call bell/phone within reach Nurse Communication: Mobility status PT Visit Diagnosis: Unsteadiness on feet (R26.81);Other abnormalities of gait and  mobility (R26.89);Difficulty in walking, not elsewhere classified (R26.2);Pain Pain - Right/Left: Right Pain - part of body: Knee    Time: 6948-5462 PT Time Calculation (min) (ACUTE ONLY): 28 min   Charges:   PT Evaluation $PT Eval Low Complexity: 1 Low PT Treatments $Gait Training: 8-22 mins        Moishe Spice, PT, DPT Acute Rehabilitation Services  Pager: [phone] Office: [phone]   Orvan Falconer 09/07/2021, 4:53 PM

## 2021-09-07 NOTE — Transfer of Care (Signed)
Immediate Anesthesia Transfer of Care Note  Patient: Dwayne Sawyer  Procedure(s) Performed: RIGHT TOTAL KNEE ARTHROPLASTY (Right: Knee)  Patient Location: PACU  Anesthesia Type:Spinal and MAC combined with regional for post-op pain  Level of Consciousness: awake, alert  and oriented  Airway & Oxygen Therapy: Patient Spontanous Breathing  Post-op Assessment: Report given to RN and Post -op Vital signs reviewed and stable  Post vital signs: Reviewed and stable  Last Vitals:  Vitals Value Taken Time  BP 111/68 09/07/21 1347  Temp    Pulse 64 09/07/21 1347  Resp 19 09/07/21 1347  SpO2 94 % 09/07/21 1347  Vitals shown include unvalidated device data.  Last Pain:  Vitals:   09/07/21 1032  TempSrc:   PainSc: 3       Patients Stated Pain Goal: 1 (32/54/98 2641)  Complications: No notable events documented.

## 2021-09-08 ENCOUNTER — Encounter (HOSPITAL_COMMUNITY): Payer: Self-pay | Admitting: Orthopaedic Surgery

## 2021-09-08 DIAGNOSIS — M1711 Unilateral primary osteoarthritis, right knee: Secondary | ICD-10-CM | POA: Diagnosis not present

## 2021-09-08 LAB — BASIC METABOLIC PANEL
Anion gap: 12 (ref 5–15)
BUN: 27 mg/dL — ABNORMAL HIGH (ref 6–20)
CO2: 21 mmol/L — ABNORMAL LOW (ref 22–32)
Calcium: 8.5 mg/dL — ABNORMAL LOW (ref 8.9–10.3)
Chloride: 100 mmol/L (ref 98–111)
Creatinine, Ser: 1.43 mg/dL — ABNORMAL HIGH (ref 0.61–1.24)
GFR, Estimated: 59 mL/min — ABNORMAL LOW (ref >60)
Glucose, Bld: 283 mg/dL — ABNORMAL HIGH (ref 70–99)
Potassium: 4.1 mmol/L (ref 3.5–5.1)
Sodium: 133 mmol/L — ABNORMAL LOW (ref 135–145)

## 2021-09-08 LAB — CBC
HCT: 33.3 % — ABNORMAL LOW (ref 39.0–52.0)
Hemoglobin: 11.2 g/dL — ABNORMAL LOW (ref 13.0–17.0)
MCH: 29.9 pg (ref 26.0–34.0)
MCHC: 33.6 g/dL (ref 30.0–36.0)
MCV: 88.8 fL (ref 80.0–100.0)
Platelets: 254 10*3/uL (ref 150–400)
RBC: 3.75 MIL/uL — ABNORMAL LOW (ref 4.22–5.81)
RDW: 12.6 % (ref 11.5–15.5)
WBC: 18.7 10*3/uL — ABNORMAL HIGH (ref 4.0–10.5)
nRBC: 0 % (ref 0.0–0.2)

## 2021-09-08 NOTE — Progress Notes (Signed)
Orthopedic Tech Progress Note Patient Details:  Dwayne Sawyer 07-Mar-1971 [id number]  Ortho Devices Type of Ortho Device: Crutches Ortho Device/Splint Interventions: Ordered, Adjustment     RN called requesting crutches Ronelle Smallman A Cayden Granholm 09/08/2021, 10:19 AM

## 2021-09-08 NOTE — Discharge Summary (Signed)
Patient ID: Dwayne Sawyer MRN: [id number] DOB/AGE: 01-26-1971 51 y.o.  Admit date: 09/07/2021 Discharge date: 09/08/2021  Admission Diagnoses:  Principal Problem:   Primary osteoarthritis of right knee Active Problems:   Status post total right knee replacement   Discharge Diagnoses:  Same  Past Medical History:  Diagnosis Date   Arthritis    Cancer ([place]) 2020   liver   Diabetes mellitus without complication ([place])    type 2- pt was told he was pre-diabetic   Family history of carcinoid tumor    High cholesterol    Hypertension     Surgeries: Procedure(s): RIGHT TOTAL KNEE ARTHROPLASTY on 09/07/2021   Consultants:   Discharged Condition: Improved  Hospital Course: Dwayne Sawyer is an 51 y.o. adult who was admitted 09/07/2021 for operative treatment ofPrimary osteoarthritis of right knee. Patient has severe unremitting pain that affects sleep, daily activities, and work/hobbies. After pre-op clearance the patient was taken to the operating room on 09/07/2021 and underwent  Procedure(s): RIGHT TOTAL KNEE ARTHROPLASTY.    Patient was given perioperative antibiotics:  Anti-infectives (From admission, onward)    Start     Dose/Rate Route Frequency Ordered Stop   09/07/21 1730  ceFAZolin (ANCEF) IVPB 2g/100 mL premix        2 g 200 mL/hr over 30 Minutes Intravenous Every 6 hours 09/07/21 1559 09/07/21 2301   09/07/21 1300  vancomycin (VANCOCIN) powder  Status:  Discontinued          As needed 09/07/21 1300 09/07/21 1343   09/07/21 1018  ceFAZolin (ANCEF) 2-4 GM/100ML-% IVPB       Note to Pharmacy: Humberto Leep O: cabinet override      09/07/21 1018 09/07/21 1218   09/07/21 1015  ceFAZolin (ANCEF) IVPB 2g/100 mL premix        2 g 200 mL/hr over 30 Minutes Intravenous On call to O.R. 09/07/21 1012 09/07/21 1151        Patient was given sequential compression devices, early ambulation, and chemoprophylaxis to prevent DVT.  Patient benefited maximally from hospital stay  and there were no complications.    Recent vital signs: Patient Vitals for the past 24 hrs:  BP Temp Temp src Pulse Resp SpO2 Height Weight  09/08/21 0359 110/75 (!) 97.5 F (36.4 C) Oral 68 20 95 % -- --  09/07/21 2330 101/65 98.4 F (36.9 C) Oral 73 20 96 % -- --  09/07/21 1936 101/63 98.6 F (37 C) Oral 78 18 96 % -- --  09/07/21 1555 121/74 -- -- 75 18 97 % -- --  09/07/21 1520 108/82 98.3 F (36.8 C) -- -- 13 -- -- --  09/07/21 1450 130/79 -- -- 71 17 94 % -- --  09/07/21 1435 118/72 -- -- 66 16 97 % -- --  09/07/21 1419 121/87 -- -- 69 19 97 % -- --  09/07/21 1405 107/67 -- -- 66 (!) 21 95 % -- --  09/07/21 1350 111/68 98.1 F (36.7 C) -- 64 13 95 % -- --  09/07/21 1107 125/83 -- -- 71 11 100 % -- --  09/07/21 1102 (!) 149/100 -- -- 74 (!) 24 100 % -- --  09/07/21 1012 (!) 151/91 98.3 F (36.8 C) Oral 75 17 100 % 5\' 9"  (1.753 m) 111.1 kg     Recent laboratory studies:  Recent Labs    09/08/21 0650  WBC 18.7*  HGB 11.2*  HCT 33.3*  PLT 254     Discharge Medications:  Allergies as of 09/08/2021   No Known Allergies      Medication List     TAKE these medications    amLODipine 10 MG tablet Commonly known as: NORVASC TAKE ONE TABLET BY MOUTH DAILY   aspirin EC 81 MG tablet Take 1 tablet (81 mg total) by mouth 2 (two) times daily. To be taken after surgery   atorvastatin 40 MG tablet Commonly known as: LIPITOR TAKE ONE TABLET BY MOUTH DAILY AT 6:00 IN THE EVENING   carvedilol 25 MG tablet Commonly known as: COREG Take 1 tablet (25 mg total) by mouth 2 (two) times daily with a meal.   chlorthalidone 25 MG tablet Commonly known as: HYGROTON Take 1 tablet (25 mg total) by mouth daily.   docusate sodium 100 MG capsule Commonly known as: Colace Take 1 capsule (100 mg total) by mouth daily as needed.   estrogens (conjugated) 0.3 MG tablet Commonly known as: PREMARIN Take 0.3-0.6 mg by mouth See admin instructions. Take 0.6 mg by mouth in the morning  and 0.3 mg in the evening   metFORMIN 500 MG tablet Commonly known as: GLUCOPHAGE TAKE ONE TABLET BY MOUTH EVERY MORNING WITH BREAKFAST   methocarbamol 500 MG tablet Commonly known as: Robaxin Take 1 tablet (500 mg total) by mouth 2 (two) times daily as needed. To be taken after surgery   ondansetron 4 MG tablet Commonly known as: Zofran Take 1 tablet (4 mg total) by mouth every 8 (eight) hours as needed for nausea or vomiting.   oxyCODONE-acetaminophen 5-325 MG tablet Commonly known as: Percocet Take 1-2 tablets by mouth every 6 (six) hours as needed. To be taken after surgery   progesterone 200 MG Supp Place 200 mg vaginally at bedtime.   sulfamethoxazole-trimethoprim 800-160 MG tablet Commonly known as: BACTRIM DS Take 1 tablet by mouth 2 (two) times daily. To be taken after surgery   valsartan 320 MG tablet Commonly known as: DIOVAN Take 1 tablet (320 mg total) by mouth daily.               Durable Medical Equipment  (From admission, onward)           Start     Ordered   09/07/21 1600  DME Walker rolling  Once       Question Answer Comment  Walker: With 5 Inch Wheels   Patient needs a walker to treat with the following condition Status post left partial knee replacement      09/07/21 1559   09/07/21 1600  DME 3 n 1  Once        09/07/21 1559   09/07/21 1600  DME Bedside commode  Once       Question:  Patient needs a bedside commode to treat with the following condition  Answer:  Status post left partial knee replacement   09/07/21 1559            Diagnostic Studies: DG Knee Right Port  Result Date: 09/07/2021 CLINICAL DATA:  Status post right knee arthroplasty EXAM: PORTABLE RIGHT KNEE - 1-2 VIEW COMPARISON:  06/22/2021 FINDINGS: There is interval right knee arthroplasty. No fracture or dislocation is seen. There are pockets of air in the soft tissues and in the suprapatellar bursa related to recent surgery. IMPRESSION: Status post right knee  arthroplasty. Electronically Signed   By: Elmer Picker M.D.   On: 09/07/2021 15:18    Disposition: Discharge disposition: 01-Home or Self Care  Follow-up Information     Leandrew Koyanagi, MD. Schedule an appointment as soon as possible for a visit in 2 week(s).   Specialty: Orthopedic Surgery Contact information: [name] [address] [phone]         [facility] Follow up.   Specialty: [facility] Contact information: [place] [address] [phone]                  Signed: Aundra Dubin 09/08/2021, 7:50 AM

## 2021-09-08 NOTE — Progress Notes (Signed)
Subjective: 1 Day Post-Op Procedure(s) (LRB): RIGHT TOTAL KNEE ARTHROPLASTY (Right) Patient reports pain as mild.  Feeling great!  Objective: Vital signs in last 24 hours: Temp:  [97.5 F (36.4 C)-98.6 F (37 C)] 97.5 F (36.4 C) (02/16 0359) Pulse Rate:  [64-78] 68 (02/16 0359) Resp:  [11-24] 20 (02/16 0359) BP: (101-151)/(63-100) 110/75 (02/16 0359) SpO2:  [94 %-100 %] 95 % (02/16 0359) Weight:  [111.1 kg] 111.1 kg (02/15 1012)  Intake/Output from previous day: 02/15 0701 - 02/16 0700 In: 1000 [I.V.:800; IV Piggyback:200] Out: 775 [Urine:700; Blood:75] Intake/Output this shift: No intake/output data recorded.  Recent Labs    09/08/21 0650  HGB 11.2*   Recent Labs    09/08/21 0650  WBC 18.7*  RBC 3.75*  HCT 33.3*  PLT 254   No results for input(s): NA, K, CL, CO2, BUN, CREATININE, GLUCOSE, CALCIUM in the last 72 hours. No results for input(s): LABPT, INR in the last 72 hours.  Neurologically intact Neurovascular intact Sensation intact distally Intact pulses distally Dorsiflexion/Plantar flexion intact Incision: dressing C/D/I No cellulitis present Compartment soft   Assessment/Plan: 1 Day Post-Op Procedure(s) (LRB): RIGHT TOTAL KNEE ARTHROPLASTY (Right) Advance diet Up with therapy D/C IV fluids Discharge home with home health after first PT session (completed stair training yesterday) WBAT RLE ABLA- mild and stable   Anticipated LOS equal to or greater than 2 midnights due to - Age 43 and older with one or more of the following:  - Obesity  - Expected need for hospital services (PT, OT, Nursing) required for safe  discharge  - Anticipated need for postoperative skilled nursing care or inpatient rehab  - Active co-morbidities: Diabetes OR   - Unanticipated findings during/Post Surgery: None  - Patient is a high risk of re-admission due to: None   Dwayne Sawyer 09/08/2021, 7:48 AM

## 2021-09-08 NOTE — Evaluation (Signed)
Occupational Therapy Evaluation Patient Details Name: Dwayne Sawyer MRN: [id number] DOB: 1971/02/06 Today's Date: 09/08/2021   History of Present Illness Pt is a 51 y.o. male who presented 09/07/21 for elective R TKA. PMH: arthritis, cancer, DM, HTN   Clinical Impression   Pt is functioning modified independently. All education completed. No further OT needs.      Recommendations for follow up therapy are one component of a multi-disciplinary discharge planning process, led by the attending physician.  Recommendations may be updated based on patient status, additional functional criteria and insurance authorization.   Follow Up Recommendations  No OT follow up    Assistance Recommended at Discharge PRN  Patient can return home with the following Assist for transportation;Help with stairs or ramp for entrance    Functional Status Assessment  Patient has had a recent decline in their functional status and demonstrates the ability to make significant improvements in function in a reasonable and predictable amount of time.  Equipment Recommendations  None recommended by OT    Recommendations for Other Services       Precautions / Restrictions Precautions Precautions: Fall Precaution Comments: low fall risk Restrictions Weight Bearing Restrictions: Yes RLE Weight Bearing: Weight bearing as tolerated      Mobility Bed Mobility Overal bed mobility: Modified Independent             General bed mobility comments: HOB up    Transfers Overall transfer level: Modified independent Equipment used: Rolling walker (2 wheels)                      Balance     Sitting balance-Leahy Scale: Good       Standing balance-Leahy Scale: Fair                             ADL either performed or assessed with clinical judgement   ADL Overall ADL's : Modified independent                                             Vision          Perception     Praxis      Pertinent Vitals/Pain Pain Assessment Pain Assessment: Faces Faces Pain Scale: Hurts a little bit Pain Location: R knee Pain Descriptors / Indicators: Discomfort, Operative site guarding Pain Intervention(s): Monitored during session, Repositioned     Hand Dominance Right   Extremity/Trunk Assessment Upper Extremity Assessment Upper Extremity Assessment: Overall WFL for tasks assessed   Lower Extremity Assessment Lower Extremity Assessment: Defer to PT evaluation   Cervical / Trunk Assessment Cervical / Trunk Assessment: Normal   Communication Communication Communication: No difficulties   Cognition Arousal/Alertness: Awake/alert Behavior During Therapy: WFL for tasks assessed/performed Overall Cognitive Status: Within Functional Limits for tasks assessed                                       General Comments       Exercises     Shoulder Instructions      Home Living Family/patient expects to be discharged to:: Private residence Living Arrangements: Spouse/significant other Available Help at Discharge: Family;Available 24 hours/day Type of Home: House Home Access: Stairs to enter [employer]  of Steps: 4 Entrance Stairs-Rails: Can reach both Home Layout: Two level;1/2 bath on main level;Bed/bath upstairs;Able to live on main level with bedroom/bathroom Alternate Level Stairs-Number of Steps: flight Alternate Level Stairs-Rails: Left Bathroom Shower/Tub: Tub/shower unit   Bathroom Toilet: Handicapped height     Home Equipment: Conservation officer, nature (2 wheels)   Additional Comments: Plans to stay on main floor initially and do sponge baths. Shower is upstairs.      Prior Functioning/Environment Prior Level of Function : Independent/Modified Independent;Working/employed;Driving                        OT Problem List:        OT Treatment/Interventions:      OT Goals(Current goals can be found  in the care plan section)    OT Frequency:      Co-evaluation              AM-PAC OT "6 Clicks" Daily Activity     Outcome Measure Help from another person eating meals?: None Help from another person taking care of personal grooming?: None Help from another person toileting, which includes using toliet, bedpan, or urinal?: None Help from another person bathing (including washing, rinsing, drying)?: None Help from another person to put on and taking off regular upper body clothing?: None Help from another person to put on and taking off regular lower body clothing?: None 6 Click Score: 24   End of Session Equipment Utilized During Treatment: Rolling walker (2 wheels)  Activity Tolerance: Patient tolerated treatment well Patient left: in bed;with call bell/phone within reach  OT Visit Diagnosis: Other abnormalities of gait and mobility (R26.89)                Time: 6734-1937 OT Time Calculation (min): 14 min Charges:  OT General Charges $OT Visit: 1 Visit OT Evaluation $OT Eval Low Complexity: 1 Low  Nestor Lewandowsky, OTR/L Acute Rehabilitation Services Pager: [phone] Office: [phone]  Malka So 09/08/2021, 8:39 AM

## 2021-09-08 NOTE — Progress Notes (Signed)
Physical Therapy Treatment Patient Details Name: Dwayne Sawyer MRN: [id number] DOB: 1970-08-21 Today's Date: 09/08/2021   History of Present Illness Pt is a 51 y.o. male who presented 09/07/21 for elective R TKA. PMH: arthritis, cancer, DM, HTN    PT Comments    Patient progressing well. Patient has 114 degrees of R knee flexion and lacking 5 degrees of extension. He is modified independent to supervision assist with all functional mobility. He required VC's to synchronize R heel strike with crutches as well as focusing on heel strike and push off with knee flexion to perform more normal gait mechanics. Patient able to perform increased reps of HEP with mild muscle fatigue. Patient safe to return home due to family support, good pain management, and safe functional mobility.    Recommendations for follow up therapy are one component of a multi-disciplinary discharge planning process, led by the attending physician.  Recommendations may be updated based on patient status, additional functional criteria and insurance authorization.  Follow Up Recommendations  Follow physician's recommendations for discharge plan and follow up therapies     Assistance Recommended at Discharge PRN  Patient can return home with the following A little help with bathing/dressing/bathroom;Assistance with cooking/housework;Help with stairs or ramp for entrance   Equipment Recommendations  Crutches    Recommendations for Other Services       Precautions / Restrictions Precautions Precautions: Fall Precaution Comments: low fall risk Restrictions Weight Bearing Restrictions: Yes RLE Weight Bearing: Weight bearing as tolerated     Mobility  Bed Mobility Overal bed mobility: Modified Independent             General bed mobility comments: HOB up    Transfers Overall transfer level: Modified independent Equipment used: Crutches Transfers: Sit to/from Stand Sit to Stand: Modified independent  (Device/Increase time)           General transfer comment: increased time required but stable with no sway upon standing    Ambulation/Gait Ambulation/Gait assistance: Supervision Gait Distance (Feet): 400 Feet Assistive device: Crutches Gait Pattern/deviations: Step-through pattern, Decreased weight shift to right, Antalgic Gait velocity: decreased Gait velocity interpretation: 1.31 - 2.62 ft/sec, indicative of limited community ambulator   General Gait Details: Pt with slow but steady gait pattern. He had decreased weight shift onto R side and during swing phase on R side performed circumduction. He would land on ball of foot insteady of heel strike upon initial stance phase. Patient educated in syncronizing crutches with R heel strike and knee flexion with push off.   Stairs Stairs: Yes Stairs assistance: Min guard (for safety) Stair Management: One rail Left, One rail Right, Step to pattern, Forwards, With crutches Number of Stairs: 10 General stair comments: ascends with L rail leading up with L leg, descends with L rail leading with R leg. Patient cued to place both crutches on R side instead of leaving a crutch at the bottom of the steps.   Wheelchair Mobility    Modified Rankin (Stroke Patients Only)       Balance Overall balance assessment: Needs assistance Sitting-balance support: No upper extremity supported, Feet supported Sitting balance-Leahy Scale: Good     Standing balance support: Bilateral upper extremity supported, During functional activity, Reliant on assistive device for balance Standing balance-Leahy Scale: Fair Standing balance comment: able to stand statically without UE support but reliant on crutches for balance with walking  Cognition Arousal/Alertness: Awake/alert Behavior During Therapy: WFL for tasks assessed/performed Overall Cognitive Status: Within Functional Limits for tasks assessed                                           Exercises Total Joint Exercises Short Arc Quad: 20 reps, Right, Seated Heel Slides: Seated, 20 reps, Right Hip ABduction/ADduction: 15 reps, Right, Seated Straight Leg Raises: 15 reps, Right, Seated (in recliner) Goniometric ROM: 5-114 degrees of knee flexion    General Comments General comments (skin integrity, edema, etc.): Updated reps on HEP for patient since he is progressing quickly      Pertinent Vitals/Pain Pain Assessment Pain Assessment: Faces Faces Pain Scale: No hurt    Home Living Family/patient expects to be discharged to:: Private residence Living Arrangements: Spouse/significant other Available Help at Discharge: Family;Available 24 hours/day Type of Home: House Home Access: Stairs to enter Entrance Stairs-Rails: Can reach both Entrance Stairs-Number of Steps: 4 Alternate Level Stairs-Number of Steps: flight Home Layout: Two level;1/2 bath on main level;Bed/bath upstairs;Able to live on main level with bedroom/bathroom Home Equipment: Rolling Walker (2 wheels) Additional Comments: Plans to stay on main floor initially and do sponge baths. Shower is upstairs.    Prior Function            PT Goals (current goals can now be found in the care plan section) Acute Rehab PT Goals Patient Stated Goal: to get better PT Goal Formulation: With patient Time For Goal Achievement: 09/08/21 Potential to Achieve Goals: Good    Frequency    7X/week      PT Plan Discharge plan needs to be updated    Co-evaluation              AM-PAC PT "6 Clicks" Mobility   Outcome Measure  Help needed turning from your back to your side while in a flat bed without using bedrails?: None Help needed moving from lying on your back to sitting on the side of a flat bed without using bedrails?: None Help needed moving to and from a bed to a chair (including a wheelchair)?: None Help needed standing up from a chair using your  arms (e.g., wheelchair or bedside chair)?: A Little Help needed to walk in hospital room?: A Little Help needed climbing 3-5 steps with a railing? : A Little 6 Click Score: 21    End of Session Equipment Utilized During Treatment: Gait belt Activity Tolerance: Patient tolerated treatment well Patient left: in chair;with call bell/phone within reach Nurse Communication: Mobility status PT Visit Diagnosis: Difficulty in walking, not elsewhere classified (R26.2);Unsteadiness on feet (R26.81);Other abnormalities of gait and mobility (R26.89) Pain - Right/Left: Right Pain - part of body: Knee     Time: 0831-0900 PT Time Calculation (min) (ACUTE ONLY): 29 min  Charges:  $Gait Training: 8-22 mins $Therapeutic Exercise: 8-22 mins                     Jonne Ply, SPT   [place] 09/08/2021, 11:39 AM

## 2021-09-08 NOTE — Anesthesia Postprocedure Evaluation (Signed)
Anesthesia Post Note  Patient: Dwayne Sawyer  Procedure(s) Performed: RIGHT TOTAL KNEE ARTHROPLASTY (Right: Knee)     Patient location during evaluation: PACU Anesthesia Type: Spinal Level of consciousness: awake and alert Pain management: pain level controlled Vital Signs Assessment: post-procedure vital signs reviewed and stable Respiratory status: spontaneous breathing Cardiovascular status: stable Anesthetic complications: no   No notable events documented.  Last Vitals:  Vitals:   09/08/21 0359 09/08/21 0812  BP: 110/75 120/72  Pulse: 68 73  Resp: 20 18  Temp: (!) 36.4 C 36.6 C  SpO2: 95% 98%    Last Pain:  Vitals:   09/08/21 0830  TempSrc:   PainSc: 0-No pain                 Nolon Nations

## 2021-09-08 NOTE — Progress Notes (Signed)
Pt doing well. Pt given D/C instructions with verbal understanding. Rx's were sent to the pharmacy by MD. Pt's incision is clean and dry with no sign of infection. Pt's IV was removed prior to D/C. Pt D/C'd home via wheelchair per MD order. Pt is stable @ D/C and has no other needs at this time. Matias Thurman, RN  

## 2021-09-09 ENCOUNTER — Telehealth: Payer: Self-pay

## 2021-09-09 ENCOUNTER — Other Ambulatory Visit: Payer: Self-pay | Admitting: Physician Assistant

## 2021-09-09 MED ORDER — TRAMADOL HCL 50 MG PO TABS
50.0000 mg | ORAL_TABLET | Freq: Three times a day (TID) | ORAL | 2 refills | Status: DC | PRN
Start: 2021-09-09 — End: 2021-12-23

## 2021-09-09 NOTE — Telephone Encounter (Signed)
Patient called into the office stating that he doesn't take narcotics and wanted to know if we can send him him something a little stronger than tylenol.  Please advise

## 2021-09-09 NOTE — Telephone Encounter (Signed)
Sent in tramadol

## 2021-09-09 NOTE — Telephone Encounter (Signed)
Patient aware this was called in for him  

## 2021-09-14 ENCOUNTER — Other Ambulatory Visit: Payer: Self-pay

## 2021-09-15 ENCOUNTER — Encounter: Payer: Self-pay | Admitting: Family Medicine

## 2021-09-15 ENCOUNTER — Ambulatory Visit (INDEPENDENT_AMBULATORY_CARE_PROVIDER_SITE_OTHER): Payer: 59 | Admitting: Family Medicine

## 2021-09-15 VITALS — BP 116/68 | Temp 97.6°F | Ht 69.0 in | Wt 250.6 lb

## 2021-09-15 DIAGNOSIS — E785 Hyperlipidemia, unspecified: Secondary | ICD-10-CM

## 2021-09-15 DIAGNOSIS — Z6837 Body mass index (BMI) 37.0-37.9, adult: Secondary | ICD-10-CM

## 2021-09-15 DIAGNOSIS — Z96651 Presence of right artificial knee joint: Secondary | ICD-10-CM | POA: Diagnosis not present

## 2021-09-15 DIAGNOSIS — R7303 Prediabetes: Secondary | ICD-10-CM

## 2021-09-15 DIAGNOSIS — I10 Essential (primary) hypertension: Secondary | ICD-10-CM

## 2021-09-15 NOTE — Patient Instructions (Signed)
Request cancer center lab also collect lipid profile and comprehensive metabolic panel previously ordered by Dr. Gena Fray.

## 2021-09-15 NOTE — Progress Notes (Signed)
[facility] [address] [name] [address] Dept: [phone] Dept Fax: [phone]  Chronic Care Office Visit  Subjective:    Patient ID: Dwayne Sawyer, adult    DOB: 1971-04-23, 51 y.o..   MRN: [id number]  Chief Complaint  Patient presents with   Follow-up    3 month f/u,  would like to get something for pain (doesn't want to take Tramadol).     History of Present Illness:  Patient is in today for reassessment of chronic medical issues.  Dwayne Sawyer has a history of right knee osteoarthritis. He underwent a total right knee joint replacement on 09/07/2021. He notes he is making progress in advancing his activity. He is using the CPM and seeign PT. He had refused opioids/controlled substances for pain when going home, but notes he does have some pain when getting up and around.   Dwayne Sawyer  has a history of resistant hypertension. He is currently managed on amlodipine, carvedilol, valsartan and chlorthalidone.   Dwayne Sawyer has a history of hyperlipidemia. He is  managed on atorvastatin.  Dwayne Sawyer has a history of prediabetes.  Dwayne Sawyer  has a history of obesity which impacts his hypertension, hyperlipidemia, prediabetes and his osteoarthritis. He continues to work at weight loss. His weight is down 18 lbs. since 10/2020.    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   Bowel obstruction ([place]) 06/11/2019   Rheumatoid arthritis involving multiple sites with positive  rheumatoid factor ([place]) 10/13/2015   Past Surgical History:  Procedure Laterality Date   EYE SURGERY Right    pt says we he was a baby surgery had to be done on one eye "because it wasn't as strong as the other"   [place]   inguinal- pt doesn't remember which side   LAPAROTOMY N/A 06/12/2019   Procedure: EXPLORATORY LAPAROTOMY WITH SMALL BOWEL RESECTION;  Surgeon: Johnathan Hausen, MD;  Location: [facility];  Service: General;  Laterality: N/A;   SHOULDER SURGERY Right 2001   rotator cuff repair   SMALL INTESTINE SURGERY  06/12/2019   part of ex lap procedure. pt states "3 feet of intestine was removed"   TONSILLECTOMY     as a child   TOTAL KNEE ARTHROPLASTY Right 09/07/2021   Procedure: RIGHT TOTAL KNEE ARTHROPLASTY;  Surgeon: Leandrew Koyanagi, MD;  Location: [facility];  Service: Orthopedics;  Laterality: Right;   Family History  Problem Relation Age of Onset   Cancer Father        widespread carcinoid cancer   Hypertension Mother    Hyperlipidemia Mother    Outpatient Medications Prior to Visit  Medication Sig Dispense Refill   aspirin EC 81 MG tablet Take 1 tablet (81 mg total) by mouth 2 (two) times daily. To be taken after surgery 84 tablet 0   atorvastatin (LIPITOR) 40 MG tablet TAKE ONE TABLET BY MOUTH DAILY AT 6:00 IN THE EVENING 90 tablet 3   carvedilol (COREG) 25 MG tablet Take 1 tablet (25 mg total) by mouth 2 (two) times daily with  a meal. 180 tablet 3   chlorthalidone (HYGROTON) 25 MG tablet Take 1 tablet (25 mg total) by mouth daily. 90 tablet 3   docusate sodium (COLACE) 100 MG capsule Take 1 capsule (100 mg total) by mouth daily as needed. 30 capsule 2   estrogens, conjugated, (PREMARIN) 0.3 MG tablet Take 0.3-0.6 mg by mouth See admin instructions. Take 0.6 mg by mouth in the morning and 0.3 mg in the evening     metFORMIN (GLUCOPHAGE) 500 MG tablet TAKE ONE TABLET BY MOUTH EVERY MORNING WITH BREAKFAST 90 tablet 1   methocarbamol (ROBAXIN) 500 MG tablet Take 1  tablet (500 mg total) by mouth 2 (two) times daily as needed. To be taken after surgery 20 tablet 0   progesterone 200 MG SUPP Place 200 mg vaginally at bedtime.     sulfamethoxazole-trimethoprim (BACTRIM DS) 800-160 MG tablet Take 1 tablet by mouth 2 (two) times daily. To be taken after surgery 20 tablet 0   traMADol (ULTRAM) 50 MG tablet Take 1-2 tablets (50-100 mg total) by mouth 3 (three) times daily as needed. 60 tablet 2   valsartan (DIOVAN) 320 MG tablet Take 1 tablet (320 mg total) by mouth daily. 90 tablet 3   amLODipine (NORVASC) 10 MG tablet TAKE ONE TABLET BY MOUTH DAILY 90 tablet 1   ondansetron (ZOFRAN) 4 MG tablet Take 1 tablet (4 mg total) by mouth every 8 (eight) hours as needed for nausea or vomiting. (Patient not taking: Reported on 09/15/2021) 40 tablet 0   oxyCODONE-acetaminophen (PERCOCET) 5-325 MG tablet Take 1-2 tablets by mouth every 6 (six) hours as needed. To be taken after surgery 40 tablet 0   No facility-administered medications prior to visit.   No Known Allergies    Objective:   Today's Vitals   09/15/21 0850  BP: 116/68  Temp: 97.6 F (36.4 C)  TempSrc: Temporal  SpO2: 98%  Weight: 250 lb 9.6 oz (113.7 kg)  Height: 5\' 9"  (1.753 m)   Body mass index is 37.01 kg/m.   General: Well developed, well nourished. No acute distress. Extremities: Dressing in place across anterior knee. Some moderate swelling and decreased warmth noted.   ROM limited, but not painful. Psych: Alert and oriented x3. Normal mood and affect.  Health Maintenance Due  Topic Date Due   Hepatitis C Screening  Never done   TETANUS/TDAP  Never done   Zoster Vaccines- Shingrix (1 of 2) Never done     Lab Results: CMP Latest Ref Rng & Units 09/08/2021 08/11/2021 05/13/2021  Glucose 70 - 99 mg/dL 283(H) 144(H) 140(H)  BUN 6 - 20 mg/dL 27(H) 19 20  Creatinine 0.61 - 1.24 mg/dL 1.43(H) 1.12 0.98  Sodium 135 - 145 mmol/L 133(L) 140 137  Potassium 3.5 - 5.1 mmol/L 4.1 4.0 3.6  Chloride  98 - 111 mmol/L 100 101 100  CO2 22 - 32 mmol/L 21(L) 29 27  Calcium 8.9 - 10.3 mg/dL 8.5(L) 9.4 9.4  Total Protein 6.5 - 8.1 g/dL - 7.1 7.7  Total Bilirubin 0.3 - 1.2 mg/dL - 0.2(L) 0.4  Alkaline Phos 38 - 126 U/L - 67 56  AST 15 - 41 U/L - 32 25  ALT 0 - 44 U/L - 28 21   Lab Results  Component Value Date   HGBA1C 7.3 (H) 08/11/2021   Lab Results  Component Value Date   CHOL 172 09/06/2020   HDL 49.80 09/06/2020   LDLDIRECT 88.0 09/06/2020   TRIG 244.0 (H) 09/06/2020  CHOLHDL 3 09/06/2020   Assessment & Plan:   1. Resistant hypertension Blood pressure at goal on 4-drug therapy.   2. Status post total right knee replacement Stable. Continue PT and follow with orthopedics. He is having blood work at the [facility] I asked that he request the previosuly ordered CMP run to reassess some of the abnormal labs from the day after his surgery.  3. Pre-diabetes The A1c run last month is in the diabetes range. If his blood sugar remains elevated post-operatively I will plan to follow-up with him regarding initiating treatment for diabetes.  4. Hyperlipidemia, unspecified hyperlipidemia type Lipids have been at goal. I asked that he have the pending order for lipids drawn at [place] visit.  5. Morbid obesity ([place]) associated with hypertension, hyperlipidemia, prediaebtes, and osteoarthritis 6. BMI 37.0-37.9, adult Continuing to work at weight loss through dietary changes. He will icnrease physical activity as his knee allows.  Return in about 3 months (around 12/13/2021) for Reassessment.   [name], MD

## 2021-09-16 ENCOUNTER — Other Ambulatory Visit: Payer: Self-pay

## 2021-09-16 ENCOUNTER — Inpatient Hospital Stay: Payer: 59 | Attending: Hematology

## 2021-09-16 DIAGNOSIS — D3A011 Benign carcinoid tumor of the jejunum: Secondary | ICD-10-CM

## 2021-09-16 DIAGNOSIS — D3A098 Benign carcinoid tumors of other sites: Secondary | ICD-10-CM

## 2021-09-16 DIAGNOSIS — C7A011 Malignant carcinoid tumor of the jejunum: Secondary | ICD-10-CM | POA: Diagnosis not present

## 2021-09-16 DIAGNOSIS — C7A8 Other malignant neuroendocrine tumors: Secondary | ICD-10-CM | POA: Insufficient documentation

## 2021-09-16 DIAGNOSIS — C7B8 Other secondary neuroendocrine tumors: Secondary | ICD-10-CM | POA: Insufficient documentation

## 2021-09-16 LAB — CBC WITH DIFFERENTIAL (CANCER CENTER ONLY)
Abs Immature Granulocytes: 0.1 10*3/uL — ABNORMAL HIGH (ref 0.00–0.07)
Basophils Absolute: 0.1 10*3/uL (ref 0.0–0.1)
Basophils Relative: 1 %
Eosinophils Absolute: 0.3 10*3/uL (ref 0.0–0.5)
Eosinophils Relative: 3 %
HCT: 33.5 % — ABNORMAL LOW (ref 39.0–52.0)
Hemoglobin: 10.8 g/dL — ABNORMAL LOW (ref 13.0–17.0)
Immature Granulocytes: 1 %
Lymphocytes Relative: 10 %
Lymphs Abs: 1.2 10*3/uL (ref 0.7–4.0)
MCH: 29.3 pg (ref 26.0–34.0)
MCHC: 32.2 g/dL (ref 30.0–36.0)
MCV: 91 fL (ref 80.0–100.0)
Monocytes Absolute: 0.9 10*3/uL (ref 0.1–1.0)
Monocytes Relative: 8 %
Neutro Abs: 8.9 10*3/uL — ABNORMAL HIGH (ref 1.7–7.7)
Neutrophils Relative %: 77 %
Platelet Count: 481 10*3/uL — ABNORMAL HIGH (ref 150–400)
RBC: 3.68 MIL/uL — ABNORMAL LOW (ref 4.22–5.81)
RDW: 13.2 % (ref 11.5–15.5)
WBC Count: 11.5 10*3/uL — ABNORMAL HIGH (ref 4.0–10.5)
nRBC: 0 % (ref 0.0–0.2)

## 2021-09-16 LAB — CMP (CANCER CENTER ONLY)
ALT: 15 U/L (ref 0–44)
AST: 13 U/L — ABNORMAL LOW (ref 15–41)
Albumin: 3.9 g/dL (ref 3.5–5.0)
Alkaline Phosphatase: 98 U/L (ref 38–126)
Anion gap: 9 (ref 5–15)
BUN: 31 mg/dL — ABNORMAL HIGH (ref 6–20)
CO2: 27 mmol/L (ref 22–32)
Calcium: 9.5 mg/dL (ref 8.9–10.3)
Chloride: 100 mmol/L (ref 98–111)
Creatinine: 1.51 mg/dL — ABNORMAL HIGH (ref 0.61–1.24)
GFR, Estimated: 55 mL/min — ABNORMAL LOW (ref >60)
Glucose, Bld: 98 mg/dL (ref 70–99)
Potassium: 4.7 mmol/L (ref 3.5–5.1)
Sodium: 136 mmol/L (ref 135–145)
Total Bilirubin: 0.4 mg/dL (ref 0.3–1.2)
Total Protein: 7.6 g/dL (ref 6.5–8.1)

## 2021-09-16 LAB — LIPID PANEL
Cholesterol: 135 mg/dL (ref 0–200)
HDL: 43 mg/dL (ref >40)
LDL Cholesterol: 57 mg/dL (ref 0–99)
Total CHOL/HDL Ratio: 3.1 RATIO
Triglycerides: 177 mg/dL — ABNORMAL HIGH (ref <150)
VLDL: 35 mg/dL (ref 0–40)

## 2021-09-20 ENCOUNTER — Ambulatory Visit (INDEPENDENT_AMBULATORY_CARE_PROVIDER_SITE_OTHER): Payer: 59 | Admitting: Physician Assistant

## 2021-09-20 ENCOUNTER — Other Ambulatory Visit: Payer: Self-pay

## 2021-09-20 ENCOUNTER — Encounter: Payer: Self-pay | Admitting: Orthopaedic Surgery

## 2021-09-20 DIAGNOSIS — Z96651 Presence of right artificial knee joint: Secondary | ICD-10-CM

## 2021-09-20 LAB — CHROMOGRANIN A: Chromogranin A (ng/mL): 61.5 ng/mL (ref 0.0–101.8)

## 2021-09-20 NOTE — Progress Notes (Signed)
Post-Op Visit Note   Patient: Dwayne Sawyer           Date of Birth: 1971-01-07           MRN: [id number] Visit Date: 09/20/2021 PCP: [name], MD   Assessment & Plan:  Chief Complaint:  Chief Complaint  Patient presents with   Right Knee - Pain   Visit Diagnoses:  1. Hx of total knee replacement, right     Plan: Patient is a pleasant 51 year old gentleman who comes in today 2 weeks status post right total knee replacement 09/07/2021.  He has been doing well.  He has been working with home health physical therapy making great progress.  He is taking Tylenol for pain.  He has been compliant taking his baby aspirin twice a day.  Examination of the right lower extremity reveals a well healed surgical incision with nylon sutures in place.  No evidence of infection or cellulitis.  Calf is soft nontender.  He is neurovascular intact distally.  Today, sutures were removed and Steri-Strips applied.  Formal physical therapy prescription provided to the patient to take to a location near his house.  He will continue with baby aspirin twice a day for another 4 weeks.  Dental prophylaxis reinforced.  Follow-up with [place] in 4 weeks time for repeat evaluation and 2 view x-rays of the right knee.  Follow-Up Instructions: Return in about 4 weeks (around 10/18/2021).   Orders:  No orders of the defined types were placed in this encounter.  No orders of the defined types were placed in this encounter.   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   Bowel obstruction ([place]) 06/11/2019   Rheumatoid arthritis involving multiple sites with positive rheumatoid factor ([place]) 10/13/2015   Past Medical History:  Diagnosis Date   Arthritis    Cancer ([place]) 2020   liver   Diabetes mellitus without complication ([place])    type 2- pt was told he was pre-diabetic   Family history of carcinoid tumor    High cholesterol    Hypertension     Family History  Problem Relation Age of Onset   Cancer Father        widespread carcinoid cancer   Hypertension Mother    Hyperlipidemia Mother     Past Surgical History:  Procedure Laterality Date   EYE SURGERY Right    pt says we he was a baby surgery had to be done on one eye "because it wasn't as strong as the other"   [place]   inguinal- pt doesn't remember which side   LAPAROTOMY N/A 06/12/2019   Procedure: EXPLORATORY LAPAROTOMY WITH SMALL BOWEL RESECTION;  Surgeon: Johnathan Hausen, MD;  Location: [facility];  Service: General;  Laterality: N/A;   SHOULDER SURGERY Right 2001   rotator cuff repair   SMALL INTESTINE SURGERY  06/12/2019   part of ex lap procedure. pt states "3 feet of intestine was removed"   TONSILLECTOMY     as a child  TOTAL KNEE ARTHROPLASTY Right 09/07/2021   Procedure: RIGHT TOTAL KNEE ARTHROPLASTY;  Surgeon: Leandrew Koyanagi, MD;  Location: [facility];  Service: Orthopedics;  Laterality: Right;   Social History   Occupational History   Not on file  Tobacco Use   Smoking status: Never   Smokeless tobacco: Former    Types: Chew    Quit date: 2020  Vaping Use   Vaping Use: Never used  Substance and Sexual Activity   Alcohol use: Yes    Comment: very occasionally (less than a 6 pack of beer a year)   Drug use: Never   Sexual activity: Not on file

## 2021-09-23 ENCOUNTER — Inpatient Hospital Stay: Payer: 59 | Attending: Hematology | Admitting: Hematology

## 2021-09-23 ENCOUNTER — Encounter: Payer: Self-pay | Admitting: Hematology

## 2021-09-23 ENCOUNTER — Other Ambulatory Visit: Payer: Self-pay | Admitting: Hematology

## 2021-09-23 ENCOUNTER — Other Ambulatory Visit: Payer: Self-pay

## 2021-09-23 VITALS — BP 110/68 | HR 97 | Temp 98.5°F | Resp 18 | Ht 69.0 in | Wt 249.8 lb

## 2021-09-23 DIAGNOSIS — Z79899 Other long term (current) drug therapy: Secondary | ICD-10-CM | POA: Insufficient documentation

## 2021-09-23 DIAGNOSIS — D3A011 Benign carcinoid tumor of the jejunum: Secondary | ICD-10-CM | POA: Diagnosis not present

## 2021-09-23 DIAGNOSIS — C7A011 Malignant carcinoid tumor of the jejunum: Secondary | ICD-10-CM

## 2021-09-23 DIAGNOSIS — C7B8 Other secondary neuroendocrine tumors: Secondary | ICD-10-CM | POA: Insufficient documentation

## 2021-09-23 DIAGNOSIS — C7A8 Other malignant neuroendocrine tumors: Secondary | ICD-10-CM | POA: Insufficient documentation

## 2021-09-23 NOTE — Progress Notes (Signed)
[facility]   [phone] Fax:([phone]) [phone]   Clinic Follow up Note   Patient Care Team: [name], MD as PCP - General (Family Medicine) Skeet Latch, MD as PCP - Cardiology (Cardiology) Gregor Hams, MD as Consulting Physician (Sports Medicine)  Date of Service:  09/23/2021  CHIEF COMPLAINT: f/u of neuroendocrine tumor  CURRENT THERAPY:  Surveillance  ASSESSMENT & PLAN:  Dwayne Sawyer is a 51 y.o. adult with   1. 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 Pathology confirmed well differentiated neuroendocrine tumor, with metastasis to 14 of 32 LNs. Margins clear.  -07/16/19 PET shows two right hepatic lesion which are intensely hypermetabolic consistent with metastatic NET. There is also a smaller less avid left hepatic lesion that is concerning for metastasis which was not seen on CT from 06/11/19. No other evidence of mets -His MRI abdomen from 08/04/19 shows 3 liver lesions (0.8-4.4cm) compatible with neuroendocrine metastatic lesions.  -He was seen by Dr. Barry Dienes and she did not recommend surgery due to the multiple liver mets.  -He was on monthly Lanreotide injections since 08/01/19, but stopped after 05/07/20 due to disease progression as seen on 05/31/20 DOTATATE PET scan, and restarted with Lutathera therapy  -He has received Lutathera treatments, last on 03/17/21. -restaging DOTATATE PET on 04/28/21 showed positive response to treatment. We discontinued his lanreotide injections after 05/13/21. -he is feeling well overall, with no recurrent diarrhea or flushing -f/u in 4 months -repeat CT scan in 3-4 weeks    2. RA -previously on methotrexate and enbrel -s/p right knee replacement on 09/07/21 under Dr. Erlinda Hong.   3. Hypertension, Hyperglycemia and CKD -now under care of Dr. Gena Fray, BP better controlled. -his  creatinine has been rising recently, up to 1.51 on 09/16/21. I advised him to increase his water intake.   4. Transgender and estrogen use -Ryot has started estrogen with Mollie Germany, NP in 10/2020 with goal of transgender transitioning.  -I checked his neuroendocrine ER and PR which were negative, so there is no contraindication for estrogen use. Can continue.     PLAN: -lab and CT scan in 3-4 weeks -lab and f/u in 4 months   No problem-specific Assessment & Plan notes found for this encounter.   SUMMARY OF ONCOLOGIC HISTORY: Oncology History Overview Note  Cancer Staging No matching staging information was found for the patient.    Carcinoid tumor of small intestine  06/11/2019 Imaging   US Abdomen 06/11/19  IMPRESSION: 1. There is a solid mass in the right lobe of the liver which based on current measurements may have enlarged since the prior study from 2015. Images from the previous MR at that time cannot be retrieved. Given this circumstance, it may be prudent to correlate with pre and serial post-contrast MR or CT of the liver to further evaluate. There is underlying diffuse increase in liver echogenicity, a finding indicative of hepatic steatosis.   2. Multiple loops of fluid-filled bowel. Question a degree of ileus or enteritis.   3. Study otherwise unremarkable. Note that much of the common bile duct is obscured by gas.    06/11/2019 Imaging   CT AP W Contrast 06/11/19   IMPRESSION: 1. High-grade small bowel obstruction secondary to desmoplastic response in the central right abdominal mesentery centered on a 4 cm calcified irregular/spiculated soft tissue lesion. Abrupt small bowel transition zone is identified  immediately lead adjacent to this mesenteric lesion and multiple adjacent bowel loops are tethered into this region with mesenteric edema/congestion. The loop of small bowel immediately proximal to the transition zone shows mild circumferential wall  thickening but no pneumatosis. 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 2. Small lymph nodes in the abnormal right mesentery suggest additional metastatic involvement. 3. Heterogeneous liver parenchyma, likely secondary to geographic fatty deposition. There is a focal 14 mm low-density lesion in the right liver. The patient had an MRI in the [facility] system on 08/09/2013 to evaluate a right liver lesion, but those images are not available. Follow-up MRI without and with contrast recommended to exclude metastatic disease. 4. Tiny sclerotic foci in the T12 vertebral body in both femoral heads are likely benign, but close attention on follow-up recommended. 5.  Aortic Atherosclerois (ICD10-170.0)      06/12/2019 Surgery    EXPLORATORY LAPAROTOMY WITH SMALL BOWEL RESECTION by Dr. 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 Interval resection small bowel and mesenteric mass with no evidence residual mesenteric or small bowel neuroendocrine tumor. 2. Large lesion in RIGHT hepatic lobe with intense radiotracer activity consistent with well differentiated neuroendocrine tumor metastasis. Smaller lesion in the anterior LEFT hepatic lobe is concerning for second hepatic metastasis. 3. Pulmonary nodule measuring 6 mm in LEFT upper lobe is not have radiotracer activity. Smaller RIGHT upper  lobe nodule. Recommend close attention on follow-up.   08/01/2019 - 05/07/2020 Chemotherapy   Lanreotide injection monthly starting 08/01/19. Stopped after 05/07/20 due to disease progression     08/04/2019 Imaging   MRI abdomen  IMPRESSION: 1. The previously noted lesion in the right lobe of the liver has grown slightly compared to 2015. In addition, today's study demonstrates 2 smaller lesions with similar imaging characteristics. Given the activity on the recent PET Dotatate scan, these lesions are all compatible with small neuroendocrine metastatic lesions. 2. Hepatic steatosis.   02/10/2020 Imaging   MRI abdomen  IMPRESSION: 1. The dominant right hepatic lobe mass has mildly increased in size compared to the prior exam. This currently measures 5.4 by 4.5 cm, previously 4.9 by 4.2 cm. 2. Nine additional small T2 hyperintense foci in the liver are stable, and while the small size makes these less specific, there likely small metastatic lesions. Today's exam has the benefit of less motion artifact compared to the 06/03/2020 MRI, and I was able to pick at each of these tiny lesions on the prior exam in retrospect. It is conceivable that 1 or more of these tiny lesions could represent small hemangiomas, although I do not observe classic delayed enhancement pattern. 3. Diffuse hepatic steatosis.   05/31/2020 PET scan   DOTATATE PET  IMPRESSION: 1. Progression of well differentiated neuroendocrine tumor hepatic metastasis with increase in size and radiotracer activity of dominant lesion in the LEFT hepatic lobe and multiple small lesions as described above. 2. New small focus of radiotracer activity  within the head of the pancreas. 3. Increased hepatic steatosis.      Chemotherapy   Lutathera treatments on 08/11/20, 10/06/20, 12/01/20, 01/26/21      04/28/2021 PET scan   NETSPOT GA 68 DOTATATE  IMPRESSION: 1. Interval decrease in radiotracer activity of neuroendocrine  tumor hepatic metastasis. Dominant lesion the RIGHT hepatic lobe is mildly in size. Several small peripheral lesions now have no radiotracer activity. Lesions are conspicuous in liver on noncontrast exam due to hepatic steatosis. 2. Small lesion in the head of the pancreas ahs decreased radiotracer activity. Lesion difficult to define on CT portion exam. 3. No evidence new metastatic neuroendocrine tumor. 4. Post RIGHT hemicolectomy anatomy without complication 5. New bilateral symmetric gynecomastia.      INTERVAL HISTORY:  Dwayne Sawyer is here for a follow up of neuroendocrine tumor. He was last seen by me on 05/13/21. He presents to the clinic alone. He had knee replacement surgery several weeks ago. He notes he "graduated" from home PT and is continuing OT. He reports he is doing well off the lanreotide, no new concerns. He notes occasional diarrhea that resolves on its own.   All other systems were reviewed with the patient and are negative.  MEDICAL HISTORY:  Past Medical History:  Diagnosis Date   Arthritis    Cancer ([place]) 2020   liver   Diabetes mellitus without complication ([place])    type 2- pt was told he was pre-diabetic   Family history of carcinoid tumor    High cholesterol    Hypertension     SURGICAL HISTORY: Past Surgical History:  Procedure Laterality Date   EYE SURGERY Right    pt says we he was a baby surgery had to be done on one eye "because it wasn't as strong as the other"   [place]   inguinal- pt doesn't remember which side   LAPAROTOMY N/A 06/12/2019   Procedure: EXPLORATORY LAPAROTOMY WITH SMALL BOWEL RESECTION;  Surgeon: Johnathan Hausen, MD;  Location: [facility];  Service: General;  Laterality: N/A;   SHOULDER SURGERY Right 2001   rotator cuff repair   SMALL INTESTINE SURGERY  06/12/2019   part of ex lap procedure. pt states "3 feet of intestine was removed"   TONSILLECTOMY     as a child   TOTAL KNEE ARTHROPLASTY Right 09/07/2021    Procedure: RIGHT TOTAL KNEE ARTHROPLASTY;  Surgeon: Leandrew Koyanagi, MD;  Location: [facility];  Service: Orthopedics;  Laterality: Right;    I have reviewed the social history and family history with the patient and they are unchanged from previous note.  ALLERGIES:  has No Known Allergies.  MEDICATIONS:  Current Outpatient Medications  Medication Sig Dispense Refill   amLODipine (NORVASC) 10 MG tablet TAKE ONE TABLET BY MOUTH DAILY 90 tablet 1   aspirin EC 81 MG tablet Take 1 tablet (81 mg total) by mouth 2 (two) times daily. To be taken after surgery 84 tablet 0   atorvastatin (LIPITOR) 40 MG tablet TAKE ONE TABLET BY MOUTH DAILY AT 6:00 IN THE EVENING 90 tablet 3   carvedilol (COREG) 25 MG tablet Take 1 tablet (25 mg total) by mouth 2 (two) times daily with a meal. 180 tablet 3   chlorthalidone (HYGROTON) 25 MG tablet Take 1 tablet (25 mg total) by mouth daily. 90 tablet 3   docusate sodium (COLACE) 100 MG capsule Take 1 capsule (100 mg total) by mouth daily as needed. 30 capsule 2   estrogens,  conjugated, (PREMARIN) 0.3 MG tablet Take 0.3-0.6 mg by mouth See admin instructions. Take 0.6 mg by mouth in the morning and 0.3 mg in the evening     metFORMIN (GLUCOPHAGE) 500 MG tablet TAKE ONE TABLET BY MOUTH EVERY MORNING WITH BREAKFAST 90 tablet 1   methocarbamol (ROBAXIN) 500 MG tablet Take 1 tablet (500 mg total) by mouth 2 (two) times daily as needed. To be taken after surgery 20 tablet 0   progesterone 200 MG SUPP Place 200 mg vaginally at bedtime.     sulfamethoxazole-trimethoprim (BACTRIM DS) 800-160 MG tablet Take 1 tablet by mouth 2 (two) times daily. To be taken after surgery 20 tablet 0   traMADol (ULTRAM) 50 MG tablet Take 1-2 tablets (50-100 mg total) by mouth 3 (three) times daily as needed. 60 tablet 2   valsartan (DIOVAN) 320 MG tablet Take 1 tablet (320 mg total) by mouth daily. 90 tablet 3   No current facility-administered medications for this visit.    PHYSICAL  EXAMINATION: ECOG PERFORMANCE STATUS: 2 - Symptomatic, <50% confined to bed  Vitals:   09/23/21 1405  BP: 110/68  Pulse: 97  Resp: 18  Temp: 98.5 F (36.9 C)  SpO2: 100%   Wt Readings from Last 3 Encounters:  09/23/21 249 lb 12.8 oz (113.3 kg)  09/15/21 250 lb 9.6 oz (113.7 kg)  09/07/21 245 lb (111.1 kg)     GENERAL:alert, no distress and comfortable SKIN: skin color normal, no rashes or significant lesions EYES: normal, Conjunctiva are pink and non-injected, sclera clear  NEURO: alert & oriented x 3 with fluent speech  LABORATORY DATA:  I have reviewed the data as listed CBC Latest Ref Rng & Units 09/16/2021 09/08/2021 08/11/2021  WBC 4.0 - 10.5 K/uL 11.5(H) 18.7(H) 7.7  Hemoglobin 13.0 - 17.0 g/dL 10.8(L) 11.2(L) 12.7(L)  Hematocrit 39.0 - 52.0 % 33.5(L) 33.3(L) 37.2(L)  Platelets 150 - 400 K/uL 481(H) 254 291     CMP Latest Ref Rng & Units 09/16/2021 09/08/2021 08/11/2021  Glucose 70 - 99 mg/dL 98 283(H) 144(H)  BUN 6 - 20 mg/dL 31(H) 27(H) 19  Creatinine 0.61 - 1.24 mg/dL 1.51(H) 1.43(H) 1.12  Sodium 135 - 145 mmol/L 136 133(L) 140  Potassium 3.5 - 5.1 mmol/L 4.7 4.1 4.0  Chloride 98 - 111 mmol/L 100 100 101  CO2 22 - 32 mmol/L 27 21(L) 29  Calcium 8.9 - 10.3 mg/dL 9.5 8.5(L) 9.4  Total Protein 6.5 - 8.1 g/dL 7.6 - 7.1  Total Bilirubin 0.3 - 1.2 mg/dL 0.4 - 0.2(L)  Alkaline Phos 38 - 126 U/L 98 - 67  AST 15 - 41 U/L 13(L) - 32  ALT 0 - 44 U/L 15 - 28      RADIOGRAPHIC STUDIES: I have personally reviewed the radiological images as listed and agreed with the findings in the report. No results found.    Orders Placed This Encounter  Procedures   CT ABDOMEN PELVIS W CONTRAST    Standing Status:   Future    Standing Expiration Date:   09/24/2022    Order Specific Question:   If indicated for the ordered procedure, I authorize the administration of contrast media per Radiology protocol    Answer:   Yes    Order Specific Question:   Preferred imaging location?     Answer:   [facility]    Order Specific Question:   Release to patient    Answer:   Immediate    Order Specific Question:  Is Oral Contrast requested for this exam?    Answer:   Yes, Per Radiology protocol    Order Specific Question:   Is patient pregnant?    Answer:   No   All questions were answered. The patient knows to call the clinic with any problems, questions or concerns. No barriers to learning was detected.      Truitt Merle, MD 09/23/2021   I, Wilburn Mylar, am acting as scribe for Truitt Merle, MD.   I have reviewed the above documentation for accuracy and completeness, and I agree with the above.

## 2021-09-29 ENCOUNTER — Ambulatory Visit: Payer: 59 | Attending: Physician Assistant | Admitting: Physical Therapy

## 2021-09-29 ENCOUNTER — Encounter: Payer: Self-pay | Admitting: Physical Therapy

## 2021-09-29 ENCOUNTER — Other Ambulatory Visit: Payer: Self-pay

## 2021-09-29 DIAGNOSIS — M25661 Stiffness of right knee, not elsewhere classified: Secondary | ICD-10-CM | POA: Insufficient documentation

## 2021-09-29 DIAGNOSIS — M6281 Muscle weakness (generalized): Secondary | ICD-10-CM | POA: Diagnosis present

## 2021-09-29 DIAGNOSIS — R262 Difficulty in walking, not elsewhere classified: Secondary | ICD-10-CM | POA: Insufficient documentation

## 2021-09-29 DIAGNOSIS — M25561 Pain in right knee: Secondary | ICD-10-CM | POA: Diagnosis present

## 2021-09-29 NOTE — Therapy (Signed)
[name]. [place], [name], [address] Phone: [phone]   Fax:  [phone]  Physical Therapy Evaluation  Patient Details  Name: Dwayne Sawyer MRN: [id number] Date of Birth: 02-Nov-1970 Referring Provider (PT): Dwayne Sawyer   Encounter Date: 09/29/2021   PT End of Session - 09/29/21 1351     Visit Number 1    Number of Visits 10    Date for PT Re-Evaluation 11/10/21    Authorization Type [employer]    Authorization Time Period 09/29/21 to 11/10/21    Authorization - Number of Visits 30    Activity Tolerance Patient tolerated treatment well    Behavior During Therapy [name] for tasks assessed/performed             Past Medical History:  Diagnosis Date   Arthritis    Cancer ([place]) 2020   liver   Diabetes mellitus without complication ([place])    type 2- pt was told he was pre-diabetic   Family history of carcinoid tumor    High cholesterol    Hypertension     Past Surgical History:  Procedure Laterality Date   EYE SURGERY Right    pt says we he was a baby surgery had to be done on one eye "because it wasn't as strong as the other"   [place]   inguinal- pt doesn't remember which side   LAPAROTOMY N/A 06/12/2019   Procedure: EXPLORATORY LAPAROTOMY WITH SMALL BOWEL RESECTION;  Surgeon: Johnathan Hausen, MD;  Location: [facility];  Service: General;  Laterality: N/A;   SHOULDER SURGERY Right 2001   rotator cuff repair   SMALL INTESTINE SURGERY  06/12/2019   part of ex lap procedure. pt states "3 feet of intestine was removed"   TONSILLECTOMY     as a child   TOTAL KNEE ARTHROPLASTY Right 09/07/2021   Procedure: RIGHT TOTAL KNEE ARTHROPLASTY;  Surgeon: Leandrew Koyanagi, MD;  Location: [facility];  Service: Orthopedics;  Laterality: Right;    There were no vitals filed for this visit.    Subjective Assessment - 09/29/21 1319     Subjective I had my R knee replaced on 2/15, its been doing well. I had  HHPT and did well, was DCed. Flexing my knee is hardest, I usually use the cane to go outdoors but I am walking around without the cane in the house. No falls or anything recently. Swelling is getting better.    Patient Stated Goals work on mobility, improve strength    Currently in Pain? No/denies                [name] PT Assessment - 09/29/21 0001       Assessment   Medical Diagnosis s/p R TKR    Referring Provider (PT) Dwayne Sawyer    Onset Date/Surgical Date 09/07/21    Next MD Visit surgeon's office end of march    Prior Therapy acute and HHPT for this knee      Precautions   Precautions None    Precaution Comments WBAT      Restrictions   Weight Bearing Restrictions No      Balance Screen   Has the patient fallen in the past 6 months No    Has the patient had a decrease in activity level because of a fear of falling?  No    Is the patient reluctant to leave their home because of a fear of falling?  No  Home Environment   Living Environment Private residence      Prior Function   Level of Independence Independent;Independent with basic ADLs    Vocation Full time employment    Designer, multimedia    Leisure golf      ROM / Strength   AROM / PROM / Strength AROM;Strength      AROM   AROM Assessment Site Knee    Right/Left Knee Right;Left    Right Knee Extension 10    Right Knee Flexion 88      Strength   Strength Assessment Site Knee;Hip;Ankle    Right/Left Hip Right;Left    Right Hip Flexion 4/5    Left Hip Flexion 5/5    Right/Left Knee Right;Left    Right Knee Flexion 4/5   in available ROM   Right Knee Extension 5/5   in available ROM   Left Knee Flexion 4+/5    Left Knee Extension 4+/5    Right/Left Ankle Left;Right    Right Ankle Dorsiflexion 5/5    Left Ankle Dorsiflexion 5/5                        Objective measurements completed on examination: See above findings.       [name] Treatment/Exercise -  09/29/21 0001       Exercises   Exercises Knee/Hip      Knee/Hip Exercises: Aerobic   Recumbent Bike bike seat 10 alternating between full and half rotation      Knee/Hip Exercises: Supine   Quad Sets Right;1 set;10 reps    Quad Sets Limitations 3 second holds    Straight Leg Raises Right;1 set;5 reps    Knee Flexion Right;1 set;10 reps    Knee Flexion Limitations 5 second holds with strap                     PT Education - 09/29/21 1351     Education Details exam findings, POC, HEP    Person(s) Educated Patient    Methods Explanation    Comprehension Verbalized understanding              PT Short Term Goals - 09/29/21 1402       PT SHORT TERM GOAL #1   Title Will be compliant with appropriate progressive HEP    Time 3    Period Weeks    Status New    Target Date 10/20/21      PT SHORT TERM GOAL #2   Title R knee AROM to be no more than 5 degrees extension and no less than 105 degrees flexion    Time 3    Period Weeks    Status New      PT SHORT TERM GOAL #3   Title Will be able to ambulate at least 557f with no device, reciprocal gait pattern and equal step lengths/stance times with quality heel toe pattern    Time 3    Period Weeks    Status New      PT SHORT TERM GOAL #4   Title Will be independent with edema management program    Time 3    Period Weeks    Status New               PT Long Term Goals - 09/29/21 1404       PT LONG TERM GOAL #1   Title MMT to improve by  at least 1 grade in all weak groups    Time 6    Period Weeks    Status New    Target Date 11/10/21      PT LONG TERM GOAL #2   Title Will be able to reciprocally ascend/descend at least 4 steps without increase in pain and U UE support    Time 6    Period Weeks    Status New      PT LONG TERM GOAL #3   Title Will be able to ambulate over uneven surfaces such as grass or gravel with LRAD and no unsteadiness to show improved functional balance    Time 6     Period Weeks    Status New      PT LONG TERM GOAL #4   Title Pain in R knee to be no more than 2/10 with all functional weight bearing activities at work and at home    Time [place] - 09/29/21 [place] arrives today doing well, had his R knee replaced mid-February and is doing well so far. Exam is typical for this stage of recovery from TKR, including gait deviation, balance impairment, localized edema, limited strength and ROM, and pain. He does have quite a high copay so we will definitely need to work with him and make sure his HEP is as effective as possible. Will benefit from skilled PT services to address functional impairments and assist in return to optimal level of function.    Personal Factors and Comorbidities Comorbidity 3+;Finances    Examination-Activity Limitations Locomotion Level;Transfers;Sit;Sleep;Squat;Stairs;Stand;Lift    Examination-Participation Restrictions Cleaning;Occupation;Community Activity;Shop;Laundry;Yard Work    Stability/Clinical Decision Making Stable/Uncomplicated    Designer, jewellery Low    Rehab Potential Good    PT Frequency Other (comment)   2x/week for first 3 weeks then 1x/week for an additional 3 weeks   PT Duration 6 weeks    PT Treatment/Interventions ADLs/Self Care Home Management;Cryotherapy;Electrical Stimulation;Iontophoresis '4mg'$ /ml Dexamethasone;Moist Heat;Ultrasound;DME Instruction;Gait training;Stair training;Functional mobility training;Therapeutic activities;Therapeutic exercise;Balance training;Neuromuscular re-education;Patient/family education;Manual techniques;Scar mobilization;Passive range of motion;Dry needling;Energy conservation;Taping;Vasopneumatic Device    PT Next Visit Plan progress as able and tolerated, has high co-pay so need to be cognizant of what he is already doing at home/progressions in PT and HEP additions    PT Home  Exercise Plan [id number]    Consulted and Agree with Plan of Care Patient             Patient will benefit from skilled therapeutic intervention in order to improve the following deficits and impairments:  Abnormal gait, Decreased range of motion, Difficulty walking, Increased muscle spasms, Decreased activity tolerance, Decreased skin integrity, Pain, Decreased scar mobility, Hypomobility, Impaired flexibility, Decreased strength, Decreased mobility, Increased edema  Visit Diagnosis: Acute pain of right knee  Stiffness of right knee, not elsewhere classified  Localized muscle weakness  Difficulty in walking, not elsewhere classified     Problem List Patient Active Problem List   Diagnosis Date Noted   Morbid obesity, associated with hypertension, hyperlipidemia, prediabetes, and osteoarthritis ([place]) 09/15/2021   Status post total right knee replacement 09/07/2021   Primary osteoarthritis of right knee 12/30/2020   Meniscus, medial, derangement, right 12/30/2020   BMI 37.0-37.9, adult 12/10/2020   Obstructive sleep apnea syndrome, severe 11/29/2020  Hyperlipidemia 09/06/2020   Resistant hypertension 09/06/2020   Pre-diabetes 09/06/2020   Carcinoid tumor of small intestine 06/13/2019   Bowel obstruction ([place]) 06/11/2019   Rheumatoid arthritis involving multiple sites with positive rheumatoid factor ([place]) 10/13/2015   Ann Lions PT, DPT, PN2   Supplemental Physical Therapist [place]. [place], [name], [address] Phone: [phone]   Fax:  [phone]  Name: Dwayne Sawyer MRN: [id number] Date of Birth: [birth year]

## 2021-10-02 ENCOUNTER — Emergency Department (HOSPITAL_BASED_OUTPATIENT_CLINIC_OR_DEPARTMENT_OTHER): Payer: 59

## 2021-10-02 ENCOUNTER — Encounter (HOSPITAL_BASED_OUTPATIENT_CLINIC_OR_DEPARTMENT_OTHER): Payer: Self-pay | Admitting: Emergency Medicine

## 2021-10-02 ENCOUNTER — Other Ambulatory Visit: Payer: Self-pay

## 2021-10-02 ENCOUNTER — Emergency Department (HOSPITAL_BASED_OUTPATIENT_CLINIC_OR_DEPARTMENT_OTHER)
Admission: EM | Admit: 2021-10-02 | Discharge: 2021-10-02 | Disposition: A | Discharge status: Home / Self Care | Payer: 59 | Attending: Emergency Medicine | Admitting: Emergency Medicine

## 2021-10-02 ENCOUNTER — Telehealth: Payer: 59 | Admitting: Family

## 2021-10-02 DIAGNOSIS — Z96651 Presence of right artificial knee joint: Secondary | ICD-10-CM | POA: Insufficient documentation

## 2021-10-02 DIAGNOSIS — I10 Essential (primary) hypertension: Secondary | ICD-10-CM | POA: Insufficient documentation

## 2021-10-02 DIAGNOSIS — Z7984 Long term (current) use of oral hypoglycemic drugs: Secondary | ICD-10-CM | POA: Diagnosis not present

## 2021-10-02 DIAGNOSIS — N200 Calculus of kidney: Secondary | ICD-10-CM | POA: Diagnosis not present

## 2021-10-02 DIAGNOSIS — C799 Secondary malignant neoplasm of unspecified site: Secondary | ICD-10-CM | POA: Insufficient documentation

## 2021-10-02 DIAGNOSIS — N179 Acute kidney failure, unspecified: Secondary | ICD-10-CM | POA: Diagnosis not present

## 2021-10-02 DIAGNOSIS — Z79899 Other long term (current) drug therapy: Secondary | ICD-10-CM | POA: Diagnosis not present

## 2021-10-02 DIAGNOSIS — R39198 Other difficulties with micturition: Secondary | ICD-10-CM | POA: Diagnosis present

## 2021-10-02 DIAGNOSIS — E119 Type 2 diabetes mellitus without complications: Secondary | ICD-10-CM | POA: Diagnosis not present

## 2021-10-02 DIAGNOSIS — Z7982 Long term (current) use of aspirin: Secondary | ICD-10-CM | POA: Diagnosis not present

## 2021-10-02 DIAGNOSIS — R339 Retention of urine, unspecified: Secondary | ICD-10-CM

## 2021-10-02 LAB — URINALYSIS, ROUTINE W REFLEX MICROSCOPIC
Bilirubin Urine: NEGATIVE
Glucose, UA: NEGATIVE mg/dL
Hgb urine dipstick: NEGATIVE
Ketones, ur: NEGATIVE mg/dL
Leukocytes,Ua: NEGATIVE
Nitrite: NEGATIVE
Protein, ur: NEGATIVE mg/dL
Specific Gravity, Urine: 1.01 (ref 1.005–1.030)
pH: 5 (ref 5.0–8.0)

## 2021-10-02 LAB — CBC WITH DIFFERENTIAL/PLATELET
Abs Immature Granulocytes: 0.03 10*3/uL (ref 0.00–0.07)
Basophils Absolute: 0.1 10*3/uL (ref 0.0–0.1)
Basophils Relative: 1 %
Eosinophils Absolute: 0.3 10*3/uL (ref 0.0–0.5)
Eosinophils Relative: 2 %
HCT: 32.6 % — ABNORMAL LOW (ref 39.0–52.0)
Hemoglobin: 10.9 g/dL — ABNORMAL LOW (ref 13.0–17.0)
Immature Granulocytes: 0 %
Lymphocytes Relative: 9 %
Lymphs Abs: 1 10*3/uL (ref 0.7–4.0)
MCH: 29.9 pg (ref 26.0–34.0)
MCHC: 33.4 g/dL (ref 30.0–36.0)
MCV: 89.3 fL (ref 80.0–100.0)
Monocytes Absolute: 1 10*3/uL (ref 0.1–1.0)
Monocytes Relative: 9 %
Neutro Abs: 8.7 10*3/uL — ABNORMAL HIGH (ref 1.7–7.7)
Neutrophils Relative %: 79 %
Platelets: 376 10*3/uL (ref 150–400)
RBC: 3.65 MIL/uL — ABNORMAL LOW (ref 4.22–5.81)
RDW: 13.1 % (ref 11.5–15.5)
WBC: 11.1 10*3/uL — ABNORMAL HIGH (ref 4.0–10.5)
nRBC: 0 % (ref 0.0–0.2)

## 2021-10-02 LAB — COMPREHENSIVE METABOLIC PANEL
ALT: 18 U/L (ref 0–44)
AST: 24 U/L (ref 15–41)
Albumin: 3.8 g/dL (ref 3.5–5.0)
Alkaline Phosphatase: 98 U/L (ref 38–126)
Anion gap: 13 (ref 5–15)
BUN: 30 mg/dL — ABNORMAL HIGH (ref 6–20)
CO2: 24 mmol/L (ref 22–32)
Calcium: 8.8 mg/dL — ABNORMAL LOW (ref 8.9–10.3)
Chloride: 94 mmol/L — ABNORMAL LOW (ref 98–111)
Creatinine, Ser: 1.83 mg/dL — ABNORMAL HIGH (ref 0.61–1.24)
GFR, Estimated: 44 mL/min — ABNORMAL LOW (ref >60)
Glucose, Bld: 120 mg/dL — ABNORMAL HIGH (ref 70–99)
Potassium: 3.4 mmol/L — ABNORMAL LOW (ref 3.5–5.1)
Sodium: 131 mmol/L — ABNORMAL LOW (ref 135–145)
Total Bilirubin: 0.8 mg/dL (ref 0.3–1.2)
Total Protein: 7.8 g/dL (ref 6.5–8.1)

## 2021-10-02 LAB — LIPASE, BLOOD: Lipase: 34 U/L (ref 11–51)

## 2021-10-02 IMAGING — CT CT RENAL STONE PROTOCOL
2 of 4 series · 16 of 46 positions shown, 18 images · non-contrast
Comparison: PET-CT on [DATE]

CLINICAL DATA: Urinary retention. Acute kidney injury. Metastatic
neuroendocrine tumor.

* onc *
EXAM:
CT ABDOMEN AND PELVIS WITHOUT CONTRAST
TECHNIQUE: Multidetector CT imaging of the abdomen and pelvis was performed
following the standard protocol without IV contrast.
RADIATION DOSE REDUCTION: This exam was performed according to the
departmental dose-optimization program which includes automated
exposure control, adjustment of the mA and/or kV according to
patient size and/or use of iterative reconstruction technique.

[Series 2: axial st · axial · 0.98mm/px · z∈[+734,+1168]mm · 13 of 95 slices shown, 15 images]
[im 4/95  soft-tissue]
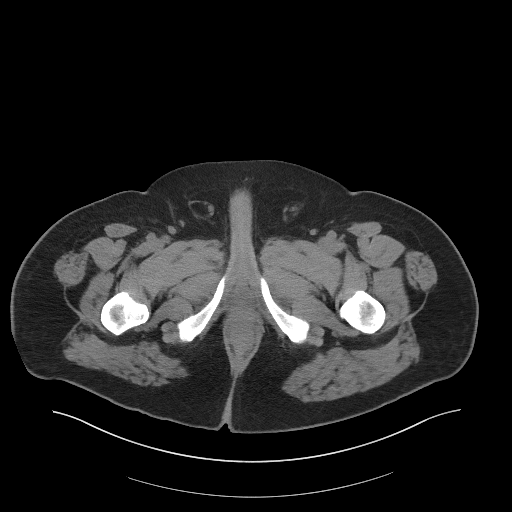
[im 4/95  bone]
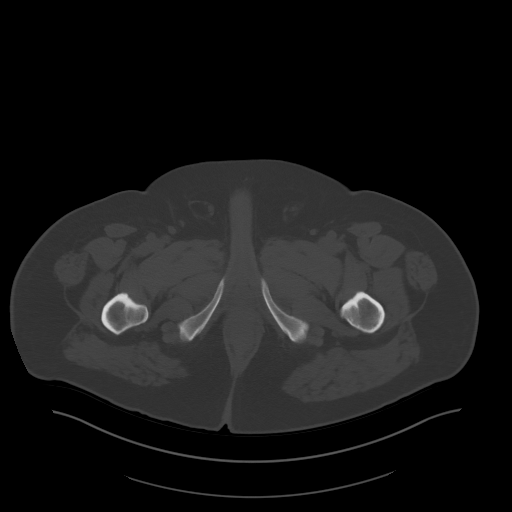
[im 12/95  soft-tissue]
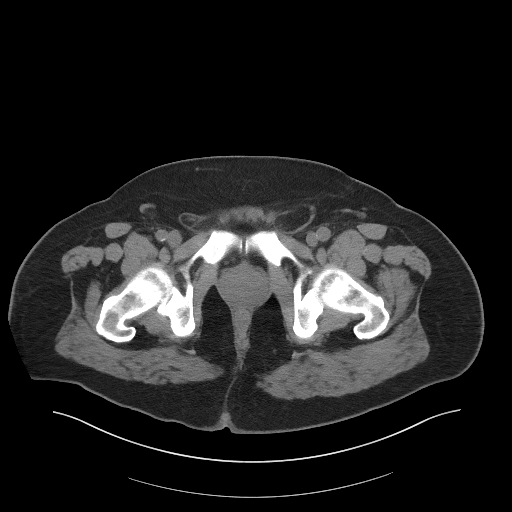
[im 19/95  soft-tissue]
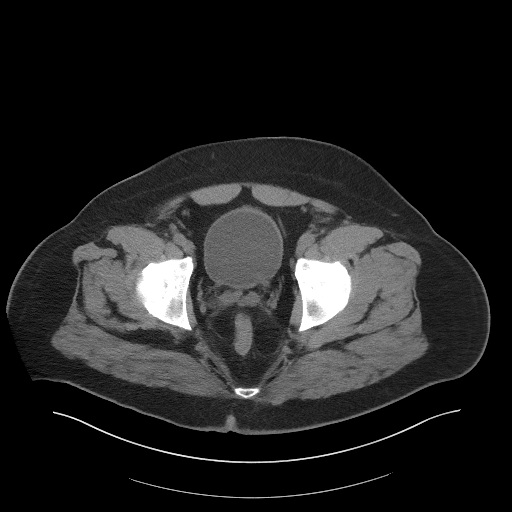
[im 27/95  soft-tissue]
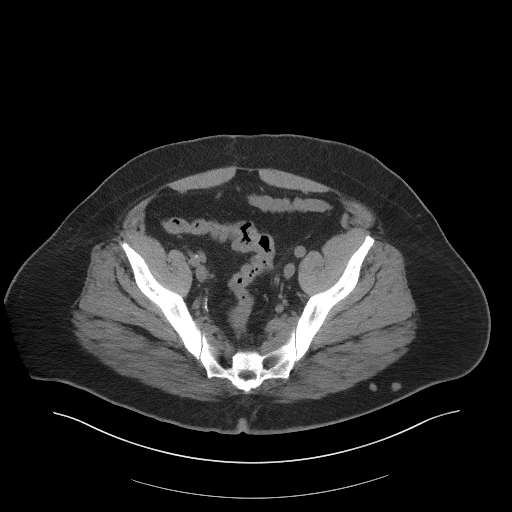
[im 34/95  soft-tissue]
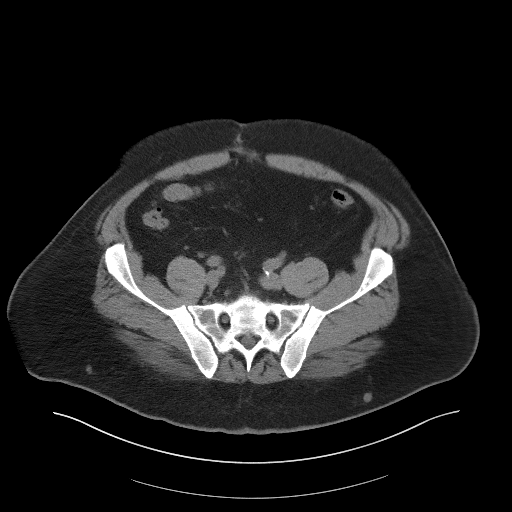
[im 42/95  soft-tissue]
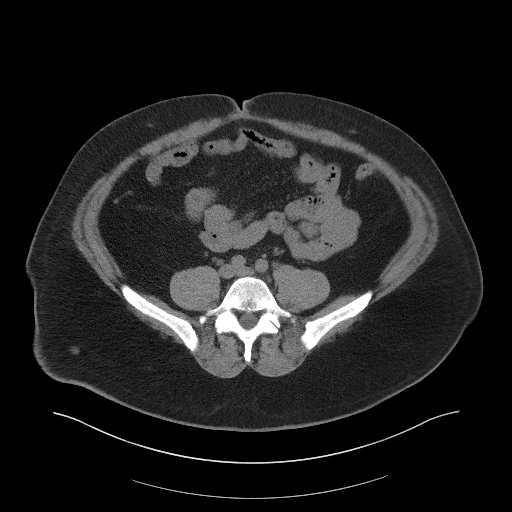
[im 49/95  soft-tissue]
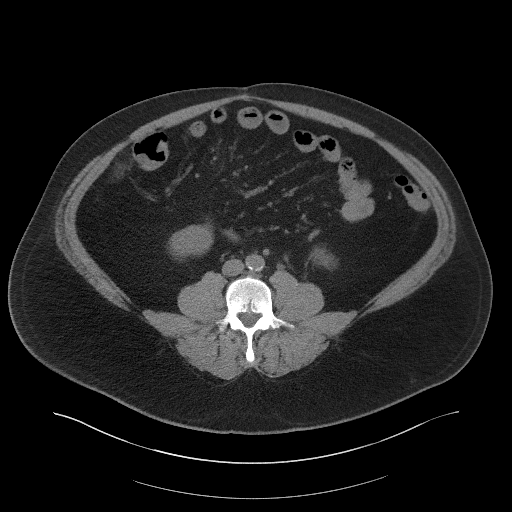
[im 53/95  soft-tissue]
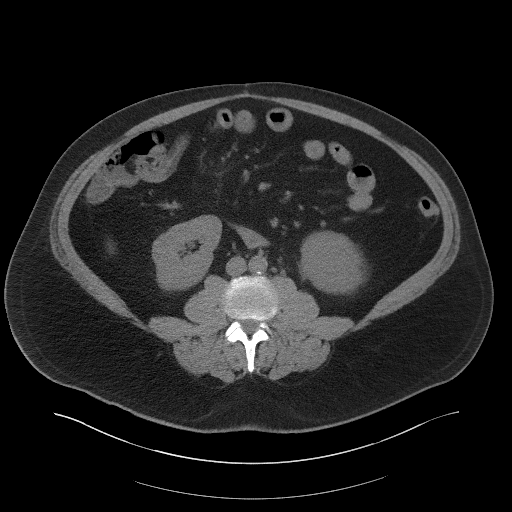
[im 61/95  soft-tissue]
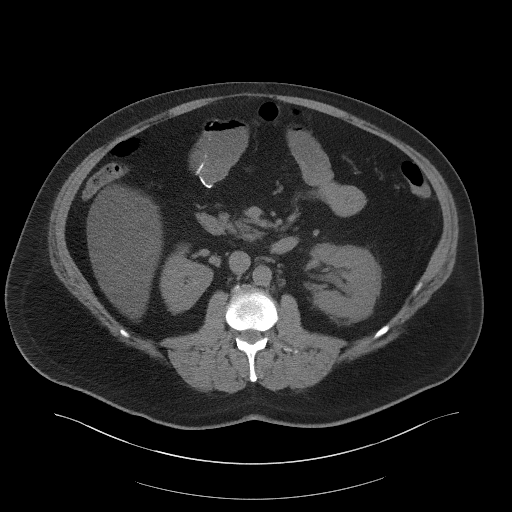
[im 61/95  bone]
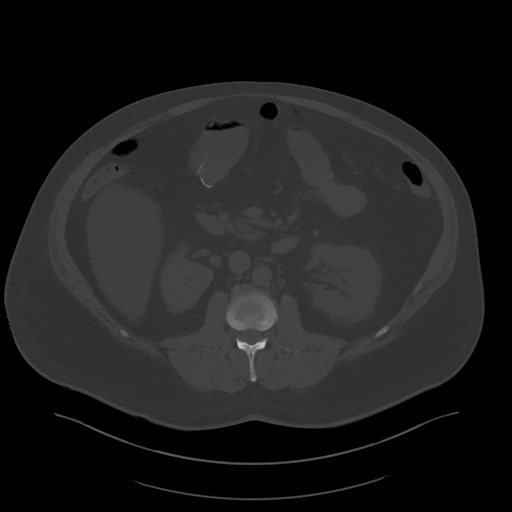
[im 68/95  soft-tissue]
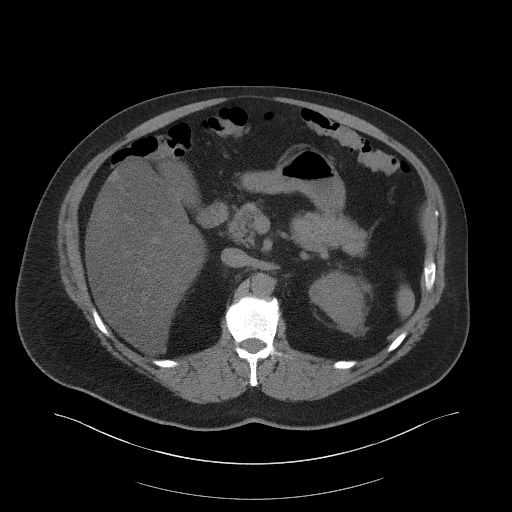
[im 76/95  soft-tissue]
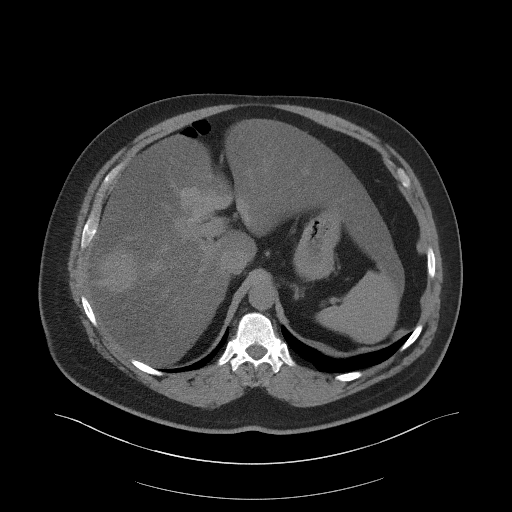
[im 83/95  soft-tissue]
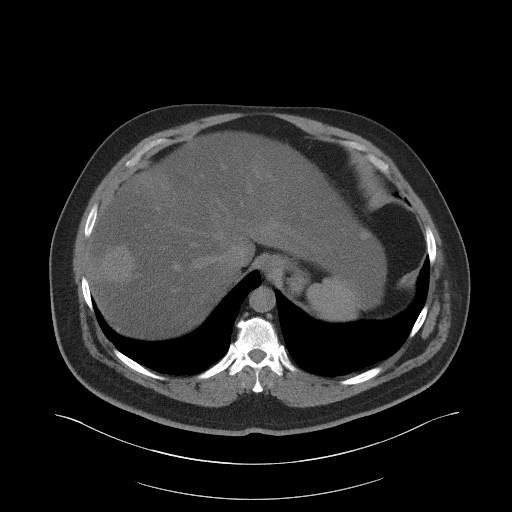
[im 91/95  soft-tissue]
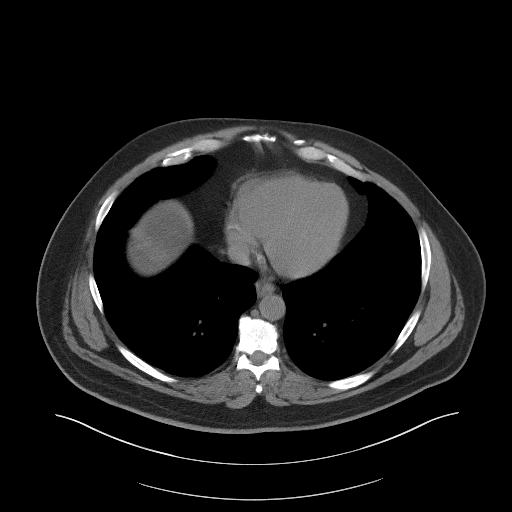

[Series 4: coronal st · coronal · 0.93mm/px · 3 of 110 slices shown]
[im 37/110  soft-tissue]
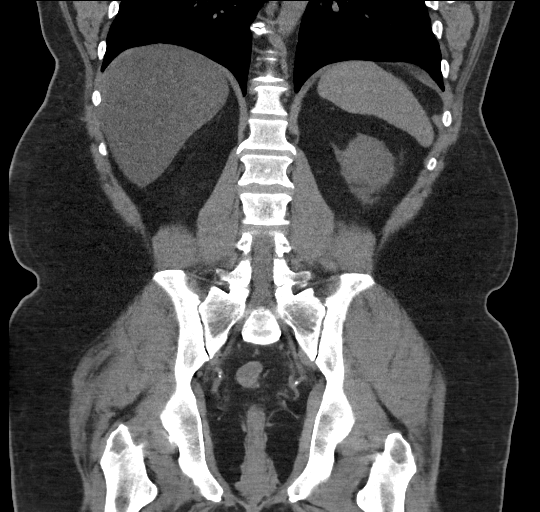
[im 49/110  soft-tissue]
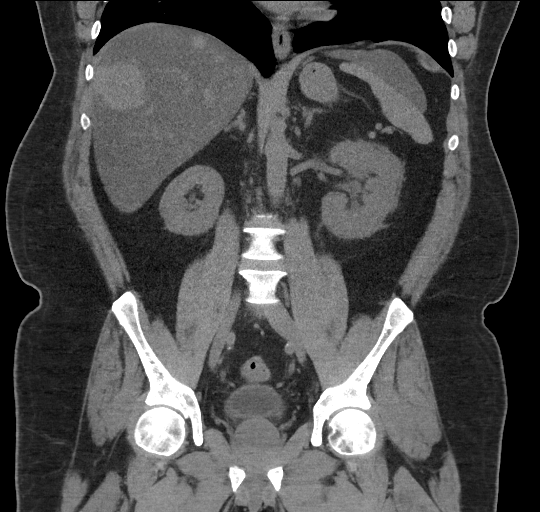
[im 61/110  soft-tissue]
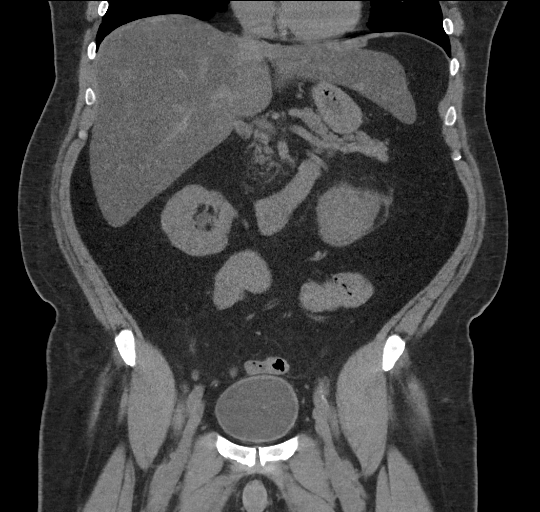

[16 of 46 positions shown; findings below may reference images not displayed]

FINDINGS: Lower chest: No acute findings.

Hepatobiliary: Severe hepatic steatosis is again demonstrated, with
probable focal fatty sparing in the central left hepatic lobe.
Dominant high attenuation mass is again seen in the anterior right
hepatic lobe, measuring 5.5 x 4.3 cm. Multiple other smaller liver
lesions measuring up to 1 cm seen throughout the right and left
hepatic lobes. These remains stable since previous study and are
consistent with known liver metastases. Gallbladder is unremarkable.
No evidence of biliary ductal dilatation.

Pancreas: No mass or inflammatory process visualized on this
unenhanced exam.

Spleen:  Within normal limits in size.

Adrenals/Urinary tract: 2 tiny less than 5 mm calculi are seen in
the lower pole of the left kidney. A tiny 1-2 mm calculus is seen in
the distal left ureter at the left UVJ, with minimal left
hydroureteronephrosis. Bladder is otherwise normal in appearance.

Stomach/Bowel: Stable postop changes from distal small bowel
resection. No masses identified. No evidence of obstruction,
inflammatory process, or abnormal fluid collections. No evidence of
mesenteric soft tissue nodules or masses.

Vascular/Lymphatic: 10 mm right common iliac lymph node on image
59/2 remains stable. No other pathologically enlarged lymph nodes
identified. No evidence of abdominal aortic aneurysm. Aortic
atherosclerotic calcification noted.

Reproductive:  No mass or other significant abnormality.

Other:  None.

Musculoskeletal:  No suspicious bone lesions identified.
IMPRESSION: Tiny 1-2 mm distal left ureteral calculus at the left UVJ, with
minimal left hydroureteronephrosis.

Stable liver metastases and severe hepatic steatosis. No new or
progressive metastatic disease identified.

Stable 10 mm right common iliac lymph node, which is nonspecific.

## 2021-10-02 MED ORDER — SODIUM CHLORIDE 0.9 % IV BOLUS
1000.0000 mL | Freq: Once | INTRAVENOUS | Status: AC
  Administered 2021-10-02: 1000 mL via INTRAVENOUS

## 2021-10-02 MED ORDER — ACETAMINOPHEN 325 MG PO TABS: 650.0000 mg | ORAL_TABLET | Freq: Four times a day (QID) | ORAL | 0 refills | Status: DC | PRN

## 2021-10-02 MED ORDER — TAMSULOSIN HCL 0.4 MG PO CAPS: 0.4000 mg | ORAL_CAPSULE | Freq: Every day | ORAL | 0 refills | Status: AC

## 2021-10-02 NOTE — ED Notes (Signed)
BLADDER SCAN PERFORMED, VOL INDICATED 17ML - 20ML ?

## 2021-10-02 NOTE — ED Triage Notes (Signed)
Pt arrives pov, ambulatory with cane, endorses urinary retention today. Pt endorses urge ?

## 2021-10-02 NOTE — Discharge Instructions (Addendum)
It was a pleasure caring for you today in the emergency department. ? ?Please follow-up with your primary care doctor for repeat creatinine in 3 days.  Please increase your water intake ? ?Please return to the emergency department for any worsening or worrisome symptoms. ? ?

## 2021-10-02 NOTE — ED Notes (Signed)
Pt returned to room from CT

## 2021-10-02 NOTE — Progress Notes (Signed)
?Virtual Visit Consent  ? ?Dwayne Sawyer, you are scheduled for a virtual visit with a [facility] provider today.   ?  ?Just as with appointments in the office, your consent must be obtained to participate.  Your consent will be active for this visit and any virtual visit you may have with one of our providers in the next 365 days.   ?  ?If you have a MyChart account, a copy of this consent can be sent to you electronically.  All virtual visits are billed to your insurance company just like a traditional visit in the office.   ? ?As this is a virtual visit, video technology does not allow for your provider to perform a traditional examination.  This may limit your provider's ability to fully assess your condition.  If your provider identifies any concerns that need to be evaluated in person or the need to arrange testing (such as labs, EKG, etc.), we will make arrangements to do so.   ?  ?Although advances in technology are sophisticated, we cannot ensure that it will always work on either your end or our end.  If the connection with a video visit is poor, the visit may have to be switched to a telephone visit.  With either a video or telephone visit, we are not always able to ensure that we have a secure connection.    ? ?I need to obtain your verbal consent now.   Are you willing to proceed with your visit today?  ?  ?Dwayne Sawyer has provided verbal consent on 10/02/2021 for a virtual visit (video or telephone). ?  ?Evelina Dun, FNP  ? ?Date: 10/02/2021 8:52 AM ? ? ?Virtual Visit via Video Note  ? ?IEvelina Dun, connected with  Dwayne Sawyer  ([id number], 07/28/1970) on 10/02/21 at  8:45 AM EDT by a video-enabled telemedicine application and verified that I am speaking with the correct person using two identifiers. ? ?Location: ?Patient: Virtual Visit Location Patient: Home ?Provider: Virtual Visit Location Provider: Home Office ?  ?I discussed the limitations of evaluation and management by telemedicine and  the availability of in person appointments. The patient expressed understanding and agreed to proceed.   ? ?History of Present Illness: ?Dwayne Sawyer is a 51 y.o. who identifies as a nonbinary who was assigned adult at birth, and is being seen today for urinary problems. States he woke up at 2 Am with feeling that he has to urinate, but can not. He had forced fluids and has only been able urinate a few drops.  ? ?HPI: HPI  ?Problems:  ?Patient Active Problem List  ? Diagnosis Date Noted  ? Morbid obesity, associated with hypertension, hyperlipidemia, prediabetes, and osteoarthritis ([place]) 09/15/2021  ? Status post total right knee replacement 09/07/2021  ? Primary osteoarthritis of right knee 12/30/2020  ? Meniscus, medial, derangement, right 12/30/2020  ? BMI 37.0-37.9, adult 12/10/2020  ? Obstructive sleep apnea syndrome, severe 11/29/2020  ? Hyperlipidemia 09/06/2020  ? Resistant hypertension 09/06/2020  ? Pre-diabetes 09/06/2020  ? 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 Bowel obstruction ([place]) 06/11/2019  ? Rheumatoid arthritis involving multiple sites with positive rheumatoid factor ([place]) 10/13/2015  ?  ?Allergies: No Known Allergies ?Medications:  ?Current Outpatient Medications:  ?  amLODipine (NORVASC) 10 MG tablet, TAKE ONE TABLET BY MOUTH DAILY, Disp: 90 tablet, Rfl: 1 ?  aspirin EC 81 MG tablet, Take 1 tablet (81 mg total) by mouth 2 (two) times daily. To be taken after surgery,  Disp: 84 tablet, Rfl: 0 ?  atorvastatin (LIPITOR) 40 MG tablet, TAKE ONE TABLET BY MOUTH DAILY AT 6:00 IN THE EVENING, Disp: 90 tablet, Rfl: 3 ?  carvedilol (COREG) 25 MG tablet, Take 1 tablet (25 mg total) by mouth 2 (two) times daily with a meal., Disp: 180 tablet, Rfl: 3 ?  chlorthalidone (HYGROTON) 25 MG tablet, Take 1 tablet (25 mg total) by mouth daily., Disp: 90 tablet, Rfl: 3 ?  docusate sodium (COLACE) 100 MG capsule, Take 1 capsule (100 mg total) by mouth daily as needed., Disp: 30 capsule, Rfl: 2 ?  estrogens,  conjugated, (PREMARIN) 0.3 MG tablet, Take 0.3-0.6 mg by mouth See admin instructions. Take 0.6 mg by mouth in the morning and 0.3 mg in the evening, Disp: , Rfl:  ?  metFORMIN (GLUCOPHAGE) 500 MG tablet, TAKE ONE TABLET BY MOUTH EVERY MORNING WITH BREAKFAST, Disp: 90 tablet, Rfl: 1 ?  progesterone 200 MG SUPP, Place 200 mg vaginally at bedtime., Disp: , Rfl:  ?  traMADol (ULTRAM) 50 MG tablet, Take 1-2 tablets (50-100 mg total) by mouth 3 (three) times daily as needed., Disp: 60 tablet, Rfl: 2 ?  valsartan (DIOVAN) 320 MG tablet, Take 1 tablet (320 mg total) by mouth daily., Disp: 90 tablet, Rfl: 3 ? ?Observations/Objective: ?Patient is well-developed, well-nourished in no acute distress.  ?Resting comfortably  at home.  ?Head is normocephalic, atraumatic.  ?No labored breathing.  ?Speech is clear and coherent with logical content.  ?Patient is alert and oriented at baseline.  ? ? ?Assessment and Plan: ?1. Unable to pass urine ? ?Given he can not pass urine, recommend patient go to ED for bladder scan and possible cath.  ?He will also need a urine test to rule out infection ?Possible Urologists referral? ? ?Follow Up Instructions: ?I discussed the assessment and treatment plan with the patient. The patient was provided an opportunity to ask questions and all were answered. The patient agreed with the plan and demonstrated an understanding of the instructions.  A copy of instructions were sent to the patient via MyChart unless otherwise noted below.  ? ? ? ?The patient was advised to call back or seek an in-person evaluation if the symptoms worsen or if the condition fails to improve as anticipated. ? ?Time:  ?I spent 14 minutes with the patient via telehealth technology discussing the above problems/concerns.   ? ?Evelina Dun, FNP ? ?

## 2021-10-02 NOTE — ED Provider Notes (Addendum)
[facility] EMERGENCY DEPARTMENT Provider Note   CSN: [id number] Arrival date & time: 10/02/21  0957     History  Chief Complaint  Patient presents with   Urinary Retention    Oddis Westling is a 51 y.o. adult.  This is a 51 y.o. 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  Patient woke up this morning unable to void completely, sensation of urinary retention.  Dribbling.  No dysuria or hematuria.  No pain to testicle or penis, no testicle or penile swelling.  No fevers, chills, nausea or vomiting.  No hematochezia or bright red per rectum.  No rashes.  No flank pain or abdominal pain.    Past Medical History: No date: Arthritis 2020: Cancer ([address])     Comment:  liver No date: Diabetes mellitus without complication (HCC)     Comment:  type 2- pt was told he was pre-diabetic No date: Family history of carcinoid tumor No date: High cholesterol No date: Hypertension  Past Surgical History: No date: EYE SURGERY; Right     Comment:  pt says we he was a baby surgery had to be done on one               eye "because it wasn't as strong as the other" 1988: HERNIA REPAIR     Comment:  inguinal- pt doesn't remember which side 06/12/2019: LAPAROTOMY; N/A     Comment:  Procedure: EXPLORATORY LAPAROTOMY WITH SMALL BOWEL               RESECTION;  Surgeon: Johnathan Hausen, MD;  Location: [facility]               ORS;  Service: General;  Laterality: N/A; 2001: SHOULDER SURGERY; Right     Comment:  rotator cuff repair 06/12/2019: SMALL INTESTINE SURGERY     Comment:  part of ex lap procedure. pt states "3 feet of intestine              was removed" No date: TONSILLECTOMY     Comment:  as a child 09/07/2021: TOTAL KNEE ARTHROPLASTY; Right     Comment:  Procedure: RIGHT TOTAL KNEE  ARTHROPLASTY;  Surgeon: Leandrew Koyanagi, MD;  Location: [facility];  Service: Orthopedics;                Laterality: Right;    The history is provided by the patient. No language interpreter was used.      Home Medications Prior to Admission medications   Medication Sig Start Date End Date Taking? Authorizing Provider  acetaminophen (TYLENOL) 325 MG tablet Take 2 tablets (650 mg total) by mouth every 6 (six) hours as needed. 10/02/21  Yes Wynona Dove A, DO  tamsulosin (FLOMAX) 0.4 MG CAPS capsule Take 1 capsule (0.4 mg total) by mouth daily after breakfast for 7 days. 10/02/21 10/09/21 Yes Wynona Dove A, DO  amLODipine (NORVASC) 10 MG tablet TAKE ONE TABLET BY MOUTH DAILY 09/06/21   [name], MD  aspirin EC 81 MG tablet Take 1 tablet (81 mg total) by mouth 2 (two) times daily. To be taken after surgery 08/08/21   Aundra Dubin, PA-C  atorvastatin (LIPITOR) 40 MG tablet TAKE ONE TABLET BY MOUTH DAILY AT 6:00 IN  THE EVENING 03/23/21   Skeet Latch, MD  carvedilol (COREG) 25 MG tablet Take 1 tablet (25 mg total) by mouth 2 (two) times daily with a meal. 01/10/21   Skeet Latch, MD  chlorthalidone (HYGROTON) 25 MG tablet Take 1 tablet (25 mg total) by mouth daily. 03/14/21   [name], MD  docusate sodium (COLACE) 100 MG capsule Take 1 capsule (100 mg total) by mouth daily as needed. 08/08/21 08/08/22  Aundra Dubin, PA-C  estrogens, conjugated, (PREMARIN) 0.3 MG tablet Take 0.3-0.6 mg by mouth See admin instructions. Take 0.6 mg by mouth in the morning and 0.3 mg in the evening    [provider]  metFORMIN (GLUCOPHAGE) 500 MG tablet TAKE ONE TABLET BY MOUTH EVERY MORNING WITH BREAKFAST 09/06/21   [name], MD  progesterone 200 MG SUPP Place 200 mg vaginally at bedtime.    [provider]  traMADol (ULTRAM) 50 MG tablet Take 1-2 tablets (50-100 mg total) by mouth 3 (three) times daily as needed. 09/09/21   Aundra Dubin, PA-C  valsartan  (DIOVAN) 320 MG tablet Take 1 tablet (320 mg total) by mouth daily. 03/14/21   [name], MD      Allergies    Patient has no known allergies.    Review of Systems   Review of Systems  Genitourinary:  Positive for difficulty urinating and frequency.  All other systems reviewed and are negative.  Physical Exam Updated Vital Signs BP 132/84    Pulse 75    Temp 98.8 F (37.1 C) (Oral)    Resp 18    Ht '5\' 9"'$  (1.753 m)    Wt 111.1 kg    SpO2 100%    BMI 36.18 kg/m  Physical Exam Vitals and nursing note reviewed.  Constitutional:      General: He is not in acute distress.    Appearance: Normal appearance. He is obese.  HENT:     Head: Normocephalic and atraumatic.     Right Ear: External ear normal.     Left Ear: External ear normal.     Nose: Nose normal.     Mouth/Throat:     Mouth: Mucous membranes are moist.  Eyes:     General: No scleral icterus.       Right eye: No discharge.        Left eye: No discharge.  Cardiovascular:     Rate and Rhythm: Normal rate and regular rhythm.     Pulses: Normal pulses.     Heart sounds: Normal heart sounds.  Pulmonary:     Effort: Pulmonary effort is normal. No respiratory distress.     Breath sounds: Normal breath sounds.  Abdominal:     General: Abdomen is flat.     Palpations: Abdomen is soft.     Tenderness: There is no abdominal tenderness.  Musculoskeletal:        General: Normal range of motion.     Cervical back: Normal range of motion.     Right lower leg: No edema.     Left lower leg: No edema.  Skin:    General: Skin is warm and dry.     Capillary Refill: Capillary refill takes less than 2 seconds.  Neurological:     Mental Status: He is alert.  Psychiatric:        Mood and Affect: Mood normal.        Behavior: Behavior normal.    ED Results / Procedures /  Treatments   Labs (all labs ordered are listed, but only abnormal results are displayed) Labs Reviewed  CBC WITH DIFFERENTIAL/PLATELET - Abnormal;  Notable for the following components:      Result Value   WBC 11.1 (*)    RBC 3.65 (*)    Hemoglobin 10.9 (*)    HCT 32.6 (*)    Neutro Abs 8.7 (*)    All other components within normal limits  COMPREHENSIVE METABOLIC PANEL - Abnormal; Notable for the following components:   Sodium 131 (*)    Potassium 3.4 (*)    Chloride 94 (*)    Glucose, Bld 120 (*)    BUN 30 (*)    Creatinine, Ser 1.83 (*)    Calcium 8.8 (*)    GFR, Estimated 44 (*)    All other components within normal limits  LIPASE, BLOOD  URINALYSIS, ROUTINE W REFLEX MICROSCOPIC    EKG None  Radiology CT Renal Stone Study  Result Date: 10/02/2021 CLINICAL DATA:  Urinary retention. Acute kidney injury. Metastatic neuroendocrine tumor. * onc * EXAM: CT ABDOMEN AND PELVIS WITHOUT CONTRAST TECHNIQUE: Multidetector CT imaging of the abdomen and pelvis was performed following the standard protocol without IV contrast. RADIATION DOSE REDUCTION: This exam was performed according to the departmental dose-optimization program which includes automated exposure control, adjustment of the mA and/or kV according to patient size and/or use of iterative reconstruction technique. COMPARISON:  PET-CT on 04/28/2021 FINDINGS: Lower chest: No acute findings. Hepatobiliary: Severe hepatic steatosis is again demonstrated, with probable focal fatty sparing in the central left hepatic lobe. Dominant high attenuation mass is again seen in the anterior right hepatic lobe, measuring 5.5 x 4.3 cm. Multiple other smaller liver lesions measuring up to 1 cm seen throughout the right and left hepatic lobes. These remains stable since previous study and are consistent with known liver metastases. Gallbladder is unremarkable. No evidence of biliary ductal dilatation. Pancreas: No mass or inflammatory process visualized on this unenhanced exam. Spleen:  Within normal limits in size. Adrenals/Urinary tract: 2 tiny less than 5 mm calculi are seen in the lower pole of  the left kidney. A tiny 1-2 mm calculus is seen in the distal left ureter at the left UVJ, with minimal left hydroureteronephrosis. Bladder is otherwise normal in appearance. Stomach/Bowel: Stable postop changes from distal small bowel resection. No masses identified. No evidence of obstruction, inflammatory process, or abnormal fluid collections. No evidence of mesenteric soft tissue nodules or masses. Vascular/Lymphatic: 10 mm right common iliac lymph node on image 59/2 remains stable. No other pathologically enlarged lymph nodes identified. No evidence of abdominal aortic aneurysm. Aortic atherosclerotic calcification noted. Reproductive:  No mass or other significant abnormality. Other:  None. Musculoskeletal:  No suspicious bone lesions identified. IMPRESSION: Tiny 1-2 mm distal left ureteral calculus at the left UVJ, with minimal left hydroureteronephrosis. Stable liver metastases and severe hepatic steatosis. No new or progressive metastatic disease identified. Stable 10 mm right common iliac lymph node, which is nonspecific. Electronically Signed   By: Marlaine Hind M.D.   On: 10/02/2021 11:42    Procedures .Critical Care Performed by: Jeanell Sparrow, DO Authorized by: Jeanell Sparrow, DO   Critical care provider statement:    Critical care time (minutes):  30   Critical care time was exclusive of:  Separately billable procedures and treating other patients   Critical care was necessary to treat or prevent imminent or life-threatening deterioration of the following conditions:  Dehydration   Critical care  was time spent personally by me on the following activities:  Development of treatment plan with patient or surrogate, discussions with consultants, evaluation of patient's response to treatment, examination of patient, ordering and review of laboratory studies, ordering and review of radiographic studies, ordering and performing treatments and interventions, pulse oximetry, re-evaluation of  patient's condition, review of old charts and obtaining history from patient or surrogate    Medications Ordered in ED Medications  sodium chloride 0.9 % bolus 1,000 mL (0 mLs Intravenous Stopped 10/02/21 1142)  sodium chloride 0.9 % bolus 1,000 mL (0 mLs Intravenous Stopped 10/02/21 1410)    ED Course/ Medical Decision Making/ A&P                           Medical Decision Making Amount and/or Complexity of Data Reviewed Labs: ordered. Radiology: ordered.  Risk OTC drugs. Prescription drug management.   Initial Impression and Ddx This patient presents to the Emergency Department for the above complaint. This involves an extensive number of treatment options and is a complaint that carries with it a high risk of complications and morbidity. Vital signs were reviewed. Serious etiologies considered. Ddx includes but is not limited to: UTI, pyelo-, nephrolithiasis, BPH  Patient PMH that increases complexity of ED encounter: Nonbinary, patient has male GU organs  Previous records obtained and reviewed    Interpretation of Diagnostics Labs & imaging results that were available during my care of the patient were visualized by me and considered in my medical decision making.   Cardiac monitoring reviewed and interpreted personally which shows NSR  Social determinants of health include - N/a  Bladder scan with approximately 20 mL of urine, no retention. Will get UA.   Renal function is worsened over the last month, baseline prior to last month was normal. Will give rpt fluid bolus, collect CT renal as GFR has been gradually worsening. 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     Ct with distal left ureteral calculus at L UVJ w/ minimal hydroureteronephrosis. No infection. Stable mets. Agree with radiologist.       Patient Reassessment and Ultimate Disposition/Management Pt reports feeling better, he is voiding spontaneously. He has ongoing elevation to his Cr,  he is tolerating PO intake. Would be reasonable to dc with close pcp f/u for rpt Cr in the next few days. Continue oral rehydration. He has small stone, mild obstruction, will start flomax and f/u w/ urology. No sig discomfort, okay with OTC analgesics PRN. Give urine strainer.   He has been followed by his oncologist regarding renal function, they are following closely per the pt. He has been struggling with water intake but will continue to increase PO liquids. F/u with pcp in the next few days for rpt Cr level. Given that he has good o/p f/u and is tolerating PO w/o difficulty reasonable to trial o/p rehydration. Pt agreeable. Will return if worse. He has been able to void w/o any difficulty while in the ED mx times   Patient management required discussion with the following services or consulting groups:  None  Complexity of Problems Addressed Acute illness or injury that poses threat of life of bodily function  Additional Data Reviewed and Analyzed Further history obtained from: Further history from spouse/family member, Past medical history and medications listed in the EMR, Prior ED visit notes, Recent PCP notes, and Prior labs/imaging results  Patient Encounter Risk Assessment Prescriptions and  Consideration of hospitalization    The patient improved significantly and was discharged in stable condition. Detailed discussions were had with the patient regarding current findings, and need for close f/u with PCP or on call doctor. The patient has been instructed to return immediately if the symptoms worsen in any way for re-evaluation. Patient verbalized understanding and is in agreement with current care plan. All questions answered prior to discharge.    This chart was dictated using voice recognition software.  Despite best efforts to proofread,  errors can occur which can change the documentation meaning.         Final Clinical Impression(s) / ED Diagnoses Final diagnoses:   Nephrolithiasis  AKI (acute kidney injury) ([place])  Metastatic malignant neoplasm, unspecified site [facility])    Rx / DC Orders ED Discharge Orders          Ordered    tamsulosin (FLOMAX) 0.4 MG CAPS capsule  Daily after breakfast        10/02/21 1151    acetaminophen (TYLENOL) 325 MG tablet  Every 6 hours PRN        10/02/21 1432              Jeanell Sparrow, DO 10/03/21 1333    Jeanell Sparrow, DO 10/03/21 1333

## 2021-10-02 NOTE — ED Notes (Signed)
ED Provider at bedside. 

## 2021-10-03 ENCOUNTER — Encounter: Payer: Self-pay | Admitting: Physical Therapy

## 2021-10-03 ENCOUNTER — Ambulatory Visit: Payer: 59 | Admitting: Physical Therapy

## 2021-10-03 DIAGNOSIS — M25661 Stiffness of right knee, not elsewhere classified: Secondary | ICD-10-CM

## 2021-10-03 DIAGNOSIS — M25561 Pain in right knee: Secondary | ICD-10-CM

## 2021-10-03 DIAGNOSIS — M6281 Muscle weakness (generalized): Secondary | ICD-10-CM

## 2021-10-03 DIAGNOSIS — R262 Difficulty in walking, not elsewhere classified: Secondary | ICD-10-CM

## 2021-10-03 NOTE — Therapy (Signed)
[place]. [place], [name], [address] Phone: [phone]   Fax:  [phone]  Physical Therapy Treatment  Patient Details  Name: Dwayne Sawyer MRN: [id number] Date of Birth: 18-Jul-1971 Referring Provider (PT): Dwana Melena   Encounter Date: 10/03/2021   PT End of Session - 10/03/21 0930     Visit Number 2    Number of Visits 10    Date for PT Re-Evaluation 11/10/21    Authorization Type [employer]    Authorization Time Period 09/29/21 to 11/10/21    Authorization - Number of Visits 30    PT Start Time 0848    PT Stop Time 0928    PT Time Calculation (min) 40 min    Activity Tolerance Patient tolerated treatment well    Behavior During Therapy [facility] for tasks assessed/performed             Past Medical History:  Diagnosis Date   Arthritis    Cancer ([place]) 2020   liver   Diabetes mellitus without complication ([place])    type 2- pt was told he was pre-diabetic   Family history of carcinoid tumor    High cholesterol    Hypertension     Past Surgical History:  Procedure Laterality Date   EYE SURGERY Right    pt says we he was a baby surgery had to be done on one eye "because it wasn't as strong as the other"   [place]   inguinal- pt doesn't remember which side   LAPAROTOMY N/A 06/12/2019   Procedure: EXPLORATORY LAPAROTOMY WITH SMALL BOWEL RESECTION;  Surgeon: Johnathan Hausen, MD;  [facility];  Service: General;  Laterality: N/A;   SHOULDER SURGERY Right 2001   rotator cuff repair   SMALL INTESTINE SURGERY  06/12/2019   part of ex lap procedure. pt states "3 feet of intestine was removed"   TONSILLECTOMY     as a child   TOTAL KNEE ARTHROPLASTY Right 09/07/2021   Procedure: RIGHT TOTAL KNEE ARTHROPLASTY;  Surgeon: Leandrew Koyanagi, MD;  Location: [place];  Service: Orthopedics;  Laterality: Right;    There were no vitals filed for this visit.   Subjective Assessment - 10/03/21  0849     Subjective I had to go to the hospital this weekend due to kidney stones, extra stiff today because of this. Nothing new going on, that was enough excitement    Currently in Pain? Yes    Pain Score 2     Pain Location Knee    Pain Orientation Right    Pain Descriptors / Indicators Aching    Pain Type Surgical pain                [facility] PT Assessment - 10/03/21 0001       AROM   Right Knee Extension 3    Right Knee Flexion 88                           [facility] Adult PT Treatment/Exercise - 10/03/21 0001       Knee/Hip Exercises: Stretches   Active Hamstring Stretch Right;3 reps;30 seconds    Other Knee/Hip Stretches forward knee flexion stretch 10x5 second holds      Knee/Hip Exercises: Aerobic   Recumbent Bike bike seat 11 progressing to 10 x6 minutes      Knee/Hip Exercises: Standing   Knee Flexion Right;1 set;10 reps  Terminal Knee Extension Right;1 set;15 reps    Terminal Knee Extension Limitations 5 second holds    Forward Step Up Right;1 set;10 reps;Hand Hold: 2;Step Height: 4"      Manual Therapy   Manual Therapy Joint mobilization;Soft tissue mobilization;Passive ROM    Edema Management --    Joint Mobilization patella mobility all directions    Soft tissue mobilization IASTM R quad    Passive ROM R knee extension and flexion                     PT Education - 10/03/21 0930     Education Details exercise form/purpose    Person(s) Educated Patient    Methods Explanation    Comprehension Verbalized understanding              PT Short Term Goals - 09/29/21 1402       PT SHORT TERM GOAL #1   Title Will be compliant with appropriate progressive HEP    Time 3    Period Weeks    Status New    Target Date 10/20/21      PT SHORT TERM GOAL #2   Title R knee AROM to be no more than 5 degrees extension and no less than 105 degrees flexion    Time 3    Period Weeks    Status New      PT SHORT TERM GOAL #3    Title Will be able to ambulate at least 534f with no device, reciprocal gait pattern and equal step lengths/stance times with quality heel toe pattern    Time 3    Period Weeks    Status New      PT SHORT TERM GOAL #4   Title Will be independent with edema management program    Time 3    Period Weeks    Status New               PT Long Term Goals - 09/29/21 1404       PT LONG TERM GOAL #1   Title MMT to improve by at least 1 grade in all weak groups    Time 6    Period Weeks    Status New    Target Date 11/10/21      PT LONG TERM GOAL #2   Title Will be able to reciprocally ascend/descend at least 4 steps without increase in pain and U UE support    Time 6    Period Weeks    Status New      PT LONG TERM GOAL #3   Title Will be able to ambulate over uneven surfaces such as grass or gravel with LRAD and no unsteadiness to show improved functional balance    Time 6    Period Weeks    Status New      PT LONG TERM GOAL #4   Title Pain in R knee to be no more than 2/10 with all functional weight bearing activities at work and at home    Time [place]- 10/03/21 0930     Clinical Impression Statement JTerionarrives today doing OK, had some issues with kidney stones over the weekend and didnt have much time to work on HEP due to this. Continued with bike for ROM, otherwise  worked on knee extension and flexion ROM and strength. We also worked on [employer] to quad as well as closed chain activities and ROM work in supine. Will continue to progress as appropriate and tolerated.    Personal Factors and Comorbidities Comorbidity 3+;Finances    Examination-Activity Limitations Locomotion Level;Transfers;Sit;Sleep;Squat;Stairs;Stand;Lift    Examination-Participation Restrictions Cleaning;Occupation;Community Activity;Shop;Laundry;Yard Work    Stability/Clinical Decision Making Stable/Uncomplicated    Clinical Decision Making  Low    Rehab Potential Good    PT Frequency Other (comment)    PT Duration 6 weeks    PT Treatment/Interventions ADLs/Self Care Home Management;Cryotherapy;Electrical Stimulation;Iontophoresis '4mg'$ /ml Dexamethasone;Moist Heat;Ultrasound;DME Instruction;Gait training;Stair training;Functional mobility training;Therapeutic activities;Therapeutic exercise;Balance training;Neuromuscular re-education;Patient/family education;Manual techniques;Scar mobilization;Passive range of motion;Dry needling;Energy conservation;Taping;Vasopneumatic Device    PT Next Visit Plan progress as able and tolerated, has high co-pay so need to be cognizant of what he is already doing at home/progressions in PT and HEP additions    PT Home Exercise Plan [id number]    Consulted and Agree with Plan of Care Patient             Patient will benefit from skilled therapeutic intervention in order to improve the following deficits and impairments:  Abnormal gait, Decreased range of motion, Difficulty walking, Increased muscle spasms, Decreased activity tolerance, Decreased skin integrity, Pain, Decreased scar mobility, Hypomobility, Impaired flexibility, Decreased strength, Decreased mobility, Increased edema  Visit Diagnosis: Acute pain of right knee  Stiffness of right knee, not elsewhere classified  Localized muscle weakness  Difficulty in walking, not elsewhere classified     Problem List Patient Active Problem List   Diagnosis Date Noted   Morbid obesity, associated with hypertension, hyperlipidemia, prediabetes, and osteoarthritis ([place]) 09/15/2021   Status post total right knee replacement 09/07/2021   Primary osteoarthritis of right knee 12/30/2020   Meniscus, medial, derangement, right 12/30/2020   BMI 37.0-37.9, adult 12/10/2020   Obstructive sleep apnea syndrome, severe 11/29/2020   Hyperlipidemia 09/06/2020   Resistant hypertension 09/06/2020   Pre-diabetes 09/06/2020   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 [place], [name], [address] Phone: [phone]   Fax:  [phone]  Name: Reg Bircher MRN: [id number] Date of Birth: 14-Aug-1970

## 2021-10-06 ENCOUNTER — Other Ambulatory Visit: Payer: Self-pay

## 2021-10-06 ENCOUNTER — Encounter: Payer: Self-pay | Admitting: Physical Therapy

## 2021-10-06 ENCOUNTER — Ambulatory Visit: Payer: 59 | Admitting: Physical Therapy

## 2021-10-06 DIAGNOSIS — R262 Difficulty in walking, not elsewhere classified: Secondary | ICD-10-CM

## 2021-10-06 DIAGNOSIS — M25561 Pain in right knee: Secondary | ICD-10-CM

## 2021-10-06 DIAGNOSIS — M6281 Muscle weakness (generalized): Secondary | ICD-10-CM

## 2021-10-06 DIAGNOSIS — M25661 Stiffness of right knee, not elsewhere classified: Secondary | ICD-10-CM

## 2021-10-06 NOTE — Therapy (Signed)
[place] ?[address] ?[place], [name], [address] ?Phone: [phone]   Fax:  [phone] ? ?Physical Therapy Treatment ? ?Patient Details  ?Name: Dwayne Sawyer ?MRN: [id number] ?Date of Birth: 11-07-1970 ?Referring Provider (PT): Dwayne Sawyer ? ? ?Encounter Date: 10/06/2021 ? ? PT End of Session - 10/06/21 0928   ? ? Visit Number 3   ? Number of Visits 10   ? Date for PT Re-Evaluation 11/10/21   ? Authorization Type [employer]   ? Authorization Time Period 09/29/21 to 11/10/21   ? Authorization - Number of Visits 30   ? PT Start Time [phone]   ? PT Stop Time 0926   ? PT Time Calculation (min) 40 min   ? Activity Tolerance Patient tolerated treatment well   ? Behavior During Therapy [facility] for tasks assessed/performed   ? ?  ?  ? ?  ? ? ?Past Medical History:  ?Diagnosis Date  ? Arthritis   ? Cancer [facility]) 2020  ? liver  ? Diabetes mellitus without complication ([place])   ? type 2- pt was told he was pre-diabetic  ? Family history of carcinoid tumor   ? High cholesterol   ? Hypertension   ? ? ?Past Surgical History:  ?Procedure Laterality Date  ? EYE SURGERY Right   ? pt says we he was a baby surgery had to be done on one eye "because it wasn't as strong as the other"  ? HERNIA REPAIR  1988  ? inguinal- pt doesn't remember which side  ? LAPAROTOMY N/A 06/12/2019  ? Procedure: EXPLORATORY LAPAROTOMY WITH SMALL BOWEL RESECTION;  Surgeon: Dwayne Hausen, MD;  [facility];  Service: General;  Laterality: N/A;  ? SHOULDER SURGERY Right 2001  ? rotator cuff repair  ? SMALL INTESTINE SURGERY  06/12/2019  ? part of ex lap procedure. pt states "3 feet of intestine was removed"  ? TONSILLECTOMY    ? as a child  ? TOTAL KNEE ARTHROPLASTY Right 09/07/2021  ? Procedure: RIGHT TOTAL KNEE ARTHROPLASTY;  Surgeon: Dwayne Koyanagi, MD;  Location: [place];  Service: Orthopedics;  Laterality: Right;  ? ? ?There were no vitals filed for this visit. ? ? Subjective Assessment - 10/06/21  0853   ? ? Subjective Doing better, feeling more recovered from kidney stone issues from the weekend. R quad is really spasming, feels like a charlie horse   ? Patient Stated Goals work on mobility, improve strength   ? Currently in Pain? Yes   ? Pain Score 3    ? Pain Location Knee   ? Pain Orientation Right   ? Pain Descriptors / Indicators Spasm;Aching;Dull   ? ?  ?  ? ?  ? ? ? ? ? ? ? ? ? ? ? ? ? ? ? ? ? ? ? ? [facility] Adult PT Treatment/Exercise - 10/06/21 0001   ? ?  ? Knee/Hip Exercises: Stretches  ? Other Knee/Hip Stretches forward knee flexion stretch 10x10 second holds   ?  ? Knee/Hip Exercises: Aerobic  ? Recumbent Bike bike seat 11 x6 minutes   really compensating this morning, did not progress to 10 but able to do full rotations on seat 11  ?  ? Knee/Hip Exercises: Standing  ? Forward Step Up Right;1 set;10 reps;Hand Hold: 2;Step Height: 4"   ? Step Down Right;1 set;10 reps;Hand Hold: 2;Step Height: 4"   ? Wall Squat 1 set;10 reps   ? Gait Training heel  toe gait training 159f x1 SPC   ?  ? Knee/Hip Exercises: Supine  ? Bridges Both;1 set;10 reps   ? Other Supine Knee/Hip Exercises supine R knee extension overpressure x10, flexion overpressure x10   ?  ? Manual Therapy  ? Manual Therapy Soft tissue mobilization   ? Soft tissue mobilization IASTM R quad   ? ?  ?  ? ?  ? ? ? ? ? ? ? ? ? ? PT Education - 10/06/21 0927   ? ? Education Details use of moist heat pack on quad mm/cold pack on knee, HEP updates, self quad STM with tennis ball   ? Person(s) Educated Patient   ? Methods Explanation;Handout;Demonstration   ? Comprehension Verbalized understanding;Returned demonstration   ? ?  ?  ? ?  ? ? ? PT Short Term Goals - 09/29/21 1402   ? ?  ? PT SHORT TERM GOAL #1  ? Title Will be compliant with appropriate progressive HEP   ? Time 3   ? Period Weeks   ? Status New   ? Target Date 10/20/21   ?  ? PT SHORT TERM GOAL #2  ? Title R knee AROM to be no more than 5 degrees extension and no less than 105 degrees  flexion   ? Time 3   ? Period Weeks   ? Status New   ?  ? PT SHORT TERM GOAL #3  ? Title Will be able to ambulate at least 5058fwith no device, reciprocal gait pattern and equal step lengths/stance times with quality heel toe pattern   ? Time 3   ? Period Weeks   ? Status New   ?  ? PT SHORT TERM GOAL #4  ? Title Will be independent with edema management program   ? Time 3   ? Period Weeks   ? Status New   ? ?  ?  ? ?  ? ? ? ? PT Long Term Goals - 09/29/21 1404   ? ?  ? PT LONG TERM GOAL #1  ? Title MMT to improve by at least 1 grade in all weak groups   ? Time 6   ? Period Weeks   ? Status New   ? Target Date 11/10/21   ?  ? PT LONG TERM GOAL #2  ? Title Will be able to reciprocally ascend/descend at least 4 steps without increase in pain and U UE support   ? Time 6   ? Period Weeks   ? Status New   ?  ? PT LONG TERM GOAL #3  ? Title Will be able to ambulate over uneven surfaces such as grass or gravel with LRAD and no unsteadiness to show improved functional balance   ? Time 6   ? Period Weeks   ? Status New   ?  ? PT LONG TERM GOAL #4  ? Title Pain in R knee to be no more than 2/10 with all functional weight bearing activities at work and at home   ? Time 6   ? Period Weeks   ? Status New   ? ?  ?  ? ?  ? ? ? ? ? ? ? ? Plan - 10/06/21 0928   ? ? Clinical Impression Statement Dwayne Sawyer today doing well, but is having ongoing problems with severe quad spasms. He has been able to pick back up on his HEP again. We continued progressing his knee as able and  tolerated, I think he is coming along nicely but as with any other TKR it will just take some time to build full ROM, strength, and function. Will continue efforts.   ? Personal Factors and Comorbidities Comorbidity 3+;Finances   ? Examination-Activity Limitations Locomotion Level;Transfers;Sit;Sleep;Squat;Stairs;Stand;Lift   ? Examination-Participation Restrictions Cleaning;Occupation;Community Activity;Shop;Laundry;[employer] Work   ? Stability/Clinical Decision  Making Stable/Uncomplicated   ? Clinical Decision Making Low   ? Rehab Potential Good   ? PT Frequency Other (comment)   ? PT Duration 6 weeks   ? PT Treatment/Interventions ADLs/Self Care Home Management;Cryotherapy;Electrical Stimulation;Iontophoresis '4mg'$ /ml Dexamethasone;Moist Heat;Ultrasound;DME Instruction;Gait training;Stair training;Functional mobility training;Therapeutic activities;Therapeutic exercise;Balance training;Neuromuscular re-education;Patient/family education;Manual techniques;Scar mobilization;Passive range of motion;Dry needling;Energy conservation;Taping;Vasopneumatic Device   ? PT Next Visit Plan progress as able and tolerated, has high co-pay so need to be cognizant of what he is already doing at home/progressions in PT and HEP additions   ? PT Home Exercise Plan [id number]   ? Consulted and Agree with Plan of Care Patient   ? ?  ?  ? ?  ? ? ?Patient will benefit from skilled therapeutic intervention in order to improve the following deficits and impairments:  Abnormal gait, Decreased range of motion, Difficulty walking, Increased muscle spasms, Decreased activity tolerance, Decreased skin integrity, Pain, Decreased scar mobility, Hypomobility, Impaired flexibility, Decreased strength, Decreased mobility, Increased edema ? ?Visit Diagnosis: ?Acute pain of right knee ? ?Stiffness of right knee, not elsewhere classified ? ?Localized muscle weakness ? ?Difficulty in walking, not elsewhere classified ? ? ? ? ?Problem List ?Patient Active Problem List  ? Diagnosis Date Noted  ? Morbid obesity, associated with hypertension, hyperlipidemia, prediabetes, and osteoarthritis ([place]) 09/15/2021  ? Status post total right knee replacement 09/07/2021  ? Primary osteoarthritis of right knee 12/30/2020  ? Meniscus, medial, derangement, right 12/30/2020  ? BMI 37.0-37.9, adult 12/10/2020  ? Obstructive sleep apnea syndrome, severe 11/29/2020  ? Hyperlipidemia 09/06/2020  ? Resistant hypertension 09/06/2020  ?  Pre-diabetes 09/06/2020  ? 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 Bowel obstruction ([place]) 06/11/2019  ? Rheumatoid arthritis involving multiple sites with positive rheumatoid factor ([place]) 10/13/2015

## 2021-10-10 ENCOUNTER — Encounter: Payer: Self-pay | Admitting: Physical Therapy

## 2021-10-10 ENCOUNTER — Other Ambulatory Visit: Payer: Self-pay

## 2021-10-10 ENCOUNTER — Ambulatory Visit: Payer: 59 | Admitting: Physical Therapy

## 2021-10-10 DIAGNOSIS — R262 Difficulty in walking, not elsewhere classified: Secondary | ICD-10-CM

## 2021-10-10 DIAGNOSIS — M25561 Pain in right knee: Secondary | ICD-10-CM | POA: Diagnosis not present

## 2021-10-10 DIAGNOSIS — M25661 Stiffness of right knee, not elsewhere classified: Secondary | ICD-10-CM

## 2021-10-10 DIAGNOSIS — M6281 Muscle weakness (generalized): Secondary | ICD-10-CM

## 2021-10-10 NOTE — Therapy (Signed)
[place] ?[address] ?[address] ?[place], [name], [address] ?Phone: [phone]   Fax:  [phone] ? ?Physical Therapy Treatment ? ?Patient Details  ?Name: Dwayne Sawyer ?MRN: [id number] ?Date of Birth: [birth year] ?Referring Provider (PT): Dwana Melena ? ? ?Encounter Date: 10/10/2021 ? ? PT End of Session - 10/10/21 0926   ? ? Visit Number 4   ? Date for PT Re-Evaluation 11/10/21   ? Authorization Type [employer]   ? Authorization Time Period 09/29/21 to 11/10/21   ? PT Start Time 0845   ? PT Stop Time 0926   ? PT Time Calculation (min) 41 min   ? Activity Tolerance Patient tolerated treatment well   ? Behavior During Therapy [facility] for tasks assessed/performed   ? ?  ?  ? ?  ? ? ?Past Medical History:  ?Diagnosis Date  ? Arthritis   ? Cancer [facility]) 2020  ? liver  ? Diabetes mellitus without complication ([place])   ? type 2- pt was told he was pre-diabetic  ? Family history of carcinoid tumor   ? High cholesterol   ? Hypertension   ? ? ?Past Surgical History:  ?Procedure Laterality Date  ? EYE SURGERY Right   ? pt says we he was a baby surgery had to be done on one eye "because it wasn't as strong as the other"  ? HERNIA REPAIR  1988  ? inguinal- pt doesn't remember which side  ? LAPAROTOMY N/A 06/12/2019  ? Procedure: EXPLORATORY LAPAROTOMY WITH SMALL BOWEL RESECTION;  Surgeon: Johnathan Hausen, MD;  [facility];  Service: General;  Laterality: N/A;  ? SHOULDER SURGERY Right 2001  ? rotator cuff repair  ? SMALL INTESTINE SURGERY  06/12/2019  ? part of ex lap procedure. pt states "3 feet of intestine was removed"  ? TONSILLECTOMY    ? as a child  ? TOTAL KNEE ARTHROPLASTY Right 09/07/2021  ? Procedure: RIGHT TOTAL KNEE ARTHROPLASTY;  Surgeon: Leandrew Koyanagi, MD;  Location: [place];  Service: Orthopedics;  Laterality: Right;  ? ? ?There were no vitals filed for this visit. ? ? Subjective Assessment - 10/10/21 0850   ? ? Subjective "Been doing ok"   ? Currently in Pain? Yes    ? Pain Score 3    ? Pain Location Knee   ? Pain Orientation Right   ? Pain Descriptors / Indicators Aching;Dull   ? ?  ?  ? ?  ? ? ? ? ? ? ? ? ? ? ? ? ? ? ? ? ? ? ? ? [facility] Adult PT Treatment/Exercise - 10/10/21 0001   ? ?  ? Knee/Hip Exercises: Aerobic  ? Nustep L3 x 6 min   ?  ? Knee/Hip Exercises: Machines for Strengthening  ? Cybex Leg Press 40lb 2x10, RLE 20lb x10   ?  ? Knee/Hip Exercises: Seated  ? Long Arc [employer] sets;10 reps   ? Long Arc Quad Weight 2 lbs.   ? Hamstring Curl Strengthening;Right;2 sets;10 reps   ? Hamstring Limitations red   ? Sit to Sand 2 sets;10 reps;without UE support   ?  ? Manual Therapy  ? Manual Therapy Soft tissue mobilization;Passive ROM   ? Soft tissue mobilization R quad   ? Passive ROM R knee extension and flexion   ? ?  ?  ? ?  ? ? ? ? ? ? ? ? ? ? ? ? PT Short Term Goals - 09/29/21 1402   ? ?  ?  PT SHORT TERM GOAL #1  ? Title Will be compliant with appropriate progressive HEP   ? Time 3   ? Period Weeks   ? Status New   ? Target Date 10/20/21   ?  ? PT SHORT TERM GOAL #2  ? Title R knee AROM to be no more than 5 degrees extension and no less than 105 degrees flexion   ? Time 3   ? Period Weeks   ? Status New   ?  ? PT SHORT TERM GOAL #3  ? Title Will be able to ambulate at least 536f with no device, reciprocal gait pattern and equal step lengths/stance times with quality heel toe pattern   ? Time 3   ? Period Weeks   ? Status New   ?  ? PT SHORT TERM GOAL #4  ? Title Will be independent with edema management program   ? Time 3   ? Period Weeks   ? Status New   ? ?  ?  ? ?  ? ? ? ? PT Long Term Goals - 09/29/21 1404   ? ?  ? PT LONG TERM GOAL #1  ? Title MMT to improve by at least 1 grade in all weak groups   ? Time 6   ? Period Weeks   ? Status New   ? Target Date 11/10/21   ?  ? PT LONG TERM GOAL #2  ? Title Will be able to reciprocally ascend/descend at least 4 steps without increase in pain and U UE support   ? Time 6   ? Period Weeks   ? Status New   ?  ? PT LONG  TERM GOAL #3  ? Title Will be able to ambulate over uneven surfaces such as grass or gravel with LRAD and no unsteadiness to show improved functional balance   ? Time 6   ? Period Weeks   ? Status New   ?  ? PT LONG TERM GOAL #4  ? Title Pain in R knee to be no more than 2/10 with all functional weight bearing activities at work and at home   ? Time 6   ? Period Weeks   ? Status New   ? ?  ?  ? ?  ? ? ? ? ? ? ? ? Plan - 10/10/21 0927   ? ? Clinical Impression Statement Pt enters with no new issues. Pt did well with a progressed treatment session. R quad and HS weakness present with LAQ and hamstring curls. Cue to shift more weight to his R side with sit to stands. Tactile cue to prevent compensation needed with PROM. End range R knee pain noted what PROM. Fatigue noted on leg press. Some difficulty with SL on leg press.   ? Personal Factors and Comorbidities Comorbidity 3+;Finances   ? Examination-Activity Limitations Locomotion Level;Transfers;Sit;Sleep;Squat;Stairs;Stand;Lift   ? Rehab Potential Good   ? PT Treatment/Interventions ADLs/Self Care Home Management;Cryotherapy;Electrical Stimulation;Iontophoresis '4mg'$ /ml Dexamethasone;Moist Heat;Ultrasound;DME Instruction;Gait training;Stair training;Functional mobility training;Therapeutic activities;Therapeutic exercise;Balance training;Neuromuscular re-education;Patient/family education;Manual techniques;Scar mobilization;Passive range of motion;Dry needling;Energy conservation;Taping;Vasopneumatic Device   ? PT Next Visit Plan progress as able and tolerated, has high co-pay so need to be cognizant of what he is already doing at home/progressions in PT and HEP additions   ? ?  ?  ? ?  ? ? ?Patient will benefit from skilled therapeutic intervention in order to improve the following deficits and impairments:    ? ?Visit Diagnosis: ?Acute  pain of right knee ? ?Localized muscle weakness ? ?Difficulty in walking, not elsewhere classified ? ?Stiffness of right knee, not  elsewhere classified ? ? ? ? ?Problem List ?Patient Active Problem List  ? Diagnosis Date Noted  ? Morbid obesity, associated with hypertension, hyperlipidemia, prediabetes, and osteoarthritis ([place]) 09/15/2021  ? Status post total right knee replacement 09/07/2021  ? Primary osteoarthritis of right knee 12/30/2020  ? Meniscus, medial, derangement, right 12/30/2020  ? BMI 37.0-37.9, adult 12/10/2020  ? Obstructive sleep apnea syndrome, severe 11/29/2020  ? Hyperlipidemia 09/06/2020  ? Resistant hypertension 09/06/2020  ? Pre-diabetes 09/06/2020  ? 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 Bowel obstruction ([place]) 06/11/2019  ? Rheumatoid arthritis involving multiple sites with positive rheumatoid factor ([place]) 10/13/2015  ? ? ?Scot Jun, PTA ?10/10/2021, 9:37 AM ? ?[place] ?[address] ?[address] ?[place], [name], [address] ?Phone: [phone]   Fax:  [phone] ? ?Name: Jayven Naill ?MRN: [id number] ?Date of Birth: 1970-10-28 ? ? ? ?

## 2021-10-11 ENCOUNTER — Encounter: Payer: Self-pay | Admitting: Family Medicine

## 2021-10-11 ENCOUNTER — Ambulatory Visit (INDEPENDENT_AMBULATORY_CARE_PROVIDER_SITE_OTHER): Payer: 59 | Admitting: Family Medicine

## 2021-10-11 VITALS — BP 122/74 | HR 79 | Temp 97.9°F | Ht 69.0 in | Wt 247.4 lb

## 2021-10-11 DIAGNOSIS — N200 Calculus of kidney: Secondary | ICD-10-CM

## 2021-10-11 MED ORDER — TAMSULOSIN HCL 0.4 MG PO CAPS: 0.4000 mg | ORAL_CAPSULE | Freq: Every day | ORAL | 3 refills | Status: DC

## 2021-10-11 NOTE — Patient Instructions (Signed)
Strain urine. ?Push water ?Return if stone does not pass in 1 month or worsening pain ? ? ?Kidney Stones ?Kidney stones are solid, rock-like deposits that form inside of the kidneys. The kidneys are a pair of organs that make urine. A kidney stone may form in a kidney and move into other parts of the urinary tract, including the tubes that connect the kidneys to the bladder (ureters), the bladder, and the tube that carries urine out of the body (urethra). As the stone moves through these areas, it can cause intense pain and block the flow of urine. ?Kidney stones are created when high levels of certain minerals are found in the urine. The stones are usually passed out of the body through urination, but in some cases, medical treatment may be needed to remove them. ?What are the causes? ?Kidney stones may be caused by: ?A condition in which certain glands produce too much parathyroid hormone (primary hyperparathyroidism), which causes too much calcium buildup in the blood. ?A buildup of uric acid crystals in the bladder (hyperuricosuria). Uric acid is a chemical that the body produces when you eat certain foods. It usually exits the body in the urine. ?Narrowing (stricture) of one or both of the ureters. ?A kidney blockage that is present at birth (congenital obstruction). ?Past surgery on the kidney or the ureters, such as gastric bypass surgery. ?What increases the risk? ?The following factors may make you more likely to develop this condition: ?Having had a kidney stone in the past. ?Having a family history of kidney stones. ?Not drinking enough water. ?Eating a diet that is high in protein, salt (sodium), or sugar. ?Being overweight or obese. ?What are the signs or symptoms? ?Symptoms of a kidney stone may include: ?Pain in the side of the abdomen, right below the ribs (flank pain). Pain usually spreads (radiates) to the groin. ?Needing to urinate frequently or urgently. ?Painful urination. ?Blood in the urine  (hematuria). ?Nausea. ?Vomiting. ?Fever and chills. ?How is this diagnosed? ?This condition may be diagnosed based on: ?Your symptoms and medical history. ?A physical exam. ?Blood tests. ?Urine tests. These may be done before and after the stone passes out of your body through urination. ?Imaging tests, such as a CT scan, abdominal X-ray, or ultrasound. ?A procedure to examine the inside of the bladder (cystoscopy). ?How is this treated? ?Treatment for kidney stones depends on the size, location, and makeup of the stones. Kidney stones will often pass out of the body through urination. You may need to: ?Increase your fluid intake to help pass the stone. In some cases, you may be given fluids through an IV and may need to be monitored at the hospital. ?Take medicine for pain. ?Make changes in your diet to help prevent kidney stones from coming back. ?Sometimes, medical procedures are needed to remove a kidney stone. This may involve: ?A procedure to break up kidney stones using: ?A focused beam of light (laser therapy). ?Shock waves (extracorporeal shock wave lithotripsy). ?Surgery to remove kidney stones. This may be needed if you have severe pain or have stones that block your urinary tract. ?Follow these instructions at home: ?Medicines ?Take over-the-counter and prescription medicines only as told by your health care provider. ?Ask your health care provider if the medicine prescribed to you requires you to avoid driving or using heavy machinery. ?Eating and drinking ?Drink enough fluid to keep your urine pale yellow. You may be instructed to drink at least 8-10 glasses of water each day. This will  help you pass the kidney stone. ?If directed, change your diet. This may include: ?Limiting how much sodium you eat. ?Eating more fruits and vegetables. ?Limiting how much animal protein--such as red meat, poultry, fish, and eggs--you eat. ?Follow instructions from your health care provider about eating or drinking  restrictions. ?General instructions ?Collect urine samples as told by your health care provider. You may need to collect a urine sample: ?24 hours after you pass the stone. ?8-12 weeks after passing the kidney stone, and every 6-12 months after that. ?Strain your urine every time you urinate, for as long as directed. Use the strainer that your health care provider recommends. ?Do not throw out the kidney stone after passing it. Keep the stone so it can be tested by your health care provider. Testing the makeup of your kidney stone may help prevent you from getting kidney stones in the future. ?Keep all follow-up visits as told by your health care provider. This is important. You may need follow-up X-rays or ultrasounds to make sure that your stone has passed. ?How is this prevented? ?To prevent another kidney stone: ?Drink enough fluid to keep your urine pale yellow. This is the best way to prevent kidney stones. ?Eat a healthy diet and follow recommendations from your health care provider about foods to avoid. You may be instructed to eat a low-protein diet. Recommendations vary depending on the type of kidney stone that you have. ?Maintain a healthy weight. ?Where to find more information ?[facility]): www.kidney.org ?[facility]): www.urologyhealth.org ?Contact a health care provider if: ?You have pain that gets worse or does not get better with medicine. ?Get help right away if: ?You have a fever or chills. ?You develop severe pain. ?You develop new abdominal pain. ?You faint. ?You are unable to urinate. ?Summary ?Kidney stones are solid, rock-like deposits that form inside of the kidneys. ?Kidney stones can cause nausea, vomiting, blood in the urine, abdominal pain, and the urge to urinate frequently. ?Treatment for kidney stones depends on the size, location, and makeup of the stones. Kidney stones will often pass out of the body through urination. ?Kidney stones can be  prevented by drinking enough fluids, eating a healthy diet, and maintaining a healthy weight. ?This information is not intended to replace advice given to you by your health care provider. Make sure you discuss any questions you have with your health care provider. ?Document Revised: 11/22/2018 Document Reviewed: 11/26/2018 ?Elsevier Patient Education ? [place]. ? ?

## 2021-10-11 NOTE — Progress Notes (Signed)
?[facility] ?[facility] ?[place] ?[name] [address] ?Dept: [phone] ?Dept Fax: [phone] ? ?Office Visit ? ?Subjective:  ? ? Patient ID: Dwayne Sawyer, adult    DOB: 24-Aug-1970, 51 y.o..   MRN: [id number] ? ?Chief Complaint  ?Patient presents with  ? Acute Visit  ?  C/o having kidney stone pain.   ? ? ?History of Present Illness: ? ?Patient is in today for evaluation of a possible kidney stone. Dwayne Sawyer was seen on 10/02/2021 at [facility] and diagnosed with a 1-2 mm stone at the left UVJ. He was treated with fluids and Flomax and was able to pass the stone, thoguh he did not retrieve this through straining. He notes that 2 days ago, he had recurrence of his symptoms. He states he had not realized until after this, that he had two additional stones present in the lower pole of the left kidney. He does report colicky pain associated with urination. He admits to some degree of cola use. ? ?Past Medical History: ?Patient Active Problem List  ? Diagnosis Date Noted  ? Morbid obesity, associated with hypertension, hyperlipidemia, prediabetes, and osteoarthritis ([place]) 09/15/2021  ? Status post total right knee replacement 09/07/2021  ? Primary osteoarthritis of right knee 12/30/2020  ? Meniscus, medial, derangement, right 12/30/2020  ? BMI 37.0-37.9, adult 12/10/2020  ? Obstructive sleep apnea syndrome, severe 11/29/2020  ? Hyperlipidemia 09/06/2020  ? Resistant hypertension 09/06/2020  ? Pre-diabetes 09/06/2020  ? 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 Bowel obstruction ([place]) 06/11/2019  ? Rheumatoid arthritis involving multiple sites with positive rheumatoid factor ([place]) 10/13/2015  ? ?Past Surgical History:  ?Procedure Laterality Date  ? EYE SURGERY Right   ? pt says we he was a baby surgery had to be done on one eye "because it wasn't as strong as the other"  ? HERNIA REPAIR  1988  ? inguinal- pt doesn't remember which side  ? LAPAROTOMY  N/A 06/12/2019  ? Procedure: EXPLORATORY LAPAROTOMY WITH SMALL BOWEL RESECTION;  Surgeon: Johnathan Hausen, MD;  Location: [facility];  Service: General;  Laterality: N/A;  ? SHOULDER SURGERY Right 2001  ? rotator cuff repair  ? SMALL INTESTINE SURGERY  06/12/2019  ? part of ex lap procedure. pt states "3 feet of intestine was removed"  ? TONSILLECTOMY    ? as a child  ? TOTAL KNEE ARTHROPLASTY Right 09/07/2021  ? Procedure: RIGHT TOTAL KNEE ARTHROPLASTY;  Surgeon: Leandrew Koyanagi, MD;  Location: [facility];  Service: Orthopedics;  Laterality: Right;  ? ?Family History  ?Problem Relation Age of Onset  ? Cancer Father   ?     widespread carcinoid cancer  ? Hypertension Mother   ? Hyperlipidemia Mother   ? ?Outpatient Medications Prior to Visit  ?Medication Sig Dispense Refill  ? acetaminophen (TYLENOL) 325 MG tablet Take 2 tablets (650 mg total) by mouth every 6 (six) hours as needed. 36 tablet 0  ? amLODipine (NORVASC) 10 MG tablet TAKE ONE TABLET BY MOUTH DAILY 90 tablet 1  ? aspirin EC 81 MG tablet Take 1 tablet (81 mg total) by mouth 2 (two) times daily. To be taken after surgery 84 tablet 0  ? atorvastatin (LIPITOR) 40 MG tablet TAKE ONE TABLET BY MOUTH DAILY AT 6:00 IN THE EVENING 90 tablet 3  ? carvedilol (COREG) 25 MG tablet Take 1 tablet (25 mg total) by mouth 2 (two) times daily with a meal. 180 tablet 3  ? chlorthalidone (  HYGROTON) 25 MG tablet Take 1 tablet (25 mg total) by mouth daily. 90 tablet 3  ? docusate sodium (COLACE) 100 MG capsule Take 1 capsule (100 mg total) by mouth daily as needed. 30 capsule 2  ? estrogens, conjugated, (PREMARIN) 0.3 MG tablet Take 0.3-0.6 mg by mouth See admin instructions. Take 0.6 mg by mouth in the morning and 0.3 mg in the evening    ? metFORMIN (GLUCOPHAGE) 500 MG tablet TAKE ONE TABLET BY MOUTH EVERY MORNING WITH BREAKFAST 90 tablet 1  ? progesterone 200 MG SUPP Place 200 mg vaginally at bedtime.    ? traMADol (ULTRAM) 50 MG tablet Take 1-2 tablets (50-100 mg total) by mouth 3  (three) times daily as needed. 60 tablet 2  ? valsartan (DIOVAN) 320 MG tablet Take 1 tablet (320 mg total) by mouth daily. 90 tablet 3  ? ?No facility-administered medications prior to visit.  ? ?No Known Allergies ?   ?Objective:  ? ?Today's Vitals  ? 10/11/21 1257  ?BP: 122/74  ?Pulse: 79  ?Temp: 97.9 ?F (36.6 ?C)  ?TempSrc: Temporal  ?SpO2: 98%  ?Weight: 247 lb 6.4 oz (112.2 kg)  ?Height: '5\' 9"'$  (1.753 m)  ? ?Body mass index is 36.53 kg/m?.  ? ?General: Well developed, well nourished. No acute distress. ?Psych: Alert and oriented. Normal mood and affect. ? ?Health Maintenance Due  ?Topic Date Due  ? Hepatitis C Screening  Never done  ? TETANUS/TDAP  Never done  ? Zoster Vaccines- Shingrix (1 of 2) Never done  ?   ?Imaging: ?CT Renal Stone Study (10/02/2021) ?FINDINGS: ?Lower chest: No acute findings. ?  ?Hepatobiliary: Severe hepatic steatosis is again demonstrated, with probable focal fatty sparing in the central left hepatic lobe. Dominant high attenuation mass is again seen in the anterior right hepatic lobe, measuring 5.5 x 4.3 cm. Multiple other smaller liver lesions measuring up to 1 cm seen throughout the right and left hepatic lobes. These remains stable since previous study and are consistent with known liver metastases. Gallbladder is unremarkable. No evidence of biliary ductal dilatation. ?  ?Pancreas: No mass or inflammatory process visualized on this unenhanced exam. ?  ?Spleen:  Within normal limits in size. ?  ?Adrenals/Urinary tract: 2 tiny less than 5 mm calculi are seen in the lower pole of the left kidney. A tiny 1-2 mm calculus is seen in the distal left ureter at the left UVJ, with minimal left hydroureteronephrosis. Bladder is otherwise normal in appearance. ?  ?Stomach/Bowel: Stable postop changes from distal small bowel resection. No masses identified. No evidence of obstruction, inflammatory process, or abnormal fluid collections. No evidence of mesenteric soft tissue nodules or masses. ?   ?Vascular/Lymphatic: 10 mm right common iliac lymph node on image 59/2 remains stable. No other pathologically enlarged lymph nodes identified. No evidence of abdominal aortic aneurysm. Aortic atherosclerotic calcification noted. ?  ?Reproductive:  No mass or other significant abnormality. ?  ?Other:  None. ?  ?Musculoskeletal:  No suspicious bone lesions identified. ?  ?IMPRESSION: ?Tiny 1-2 mm distal left ureteral calculus at the left UVJ, with minimal left hydroureteronephrosis. ?  ?Stable liver metastases and severe hepatic steatosis. No new or progressive metastatic disease identified. ?  ?Stable 10 mm right common iliac lymph node, which is nonspecific. ? ?Assessment & Plan:  ? ?1. Nephrolithiasis ?Recommend Dwayne Sawyer continue to push fluids, avoiding colas. He shodul continue to strain his urine. I will restart Flomax. He has a high probability of passing a 5 mm  stone. If not passed int he next month, or if his pain worsens, he should follow-up. ? ?- tamsulosin (FLOMAX) 0.4 MG CAPS capsule; Take 1 capsule (0.4 mg total) by mouth daily.  Dispense: 30 capsule; Refill: 3 ? ? ?Return in about 4 weeks (around 11/08/2021), or or sooner if symptoms worsen or fail to improve.  ? ?[name], MD ?

## 2021-10-13 ENCOUNTER — Ambulatory Visit: Payer: 59 | Admitting: Physical Therapy

## 2021-10-13 ENCOUNTER — Encounter: Payer: Self-pay | Admitting: Physical Therapy

## 2021-10-13 ENCOUNTER — Other Ambulatory Visit: Payer: Self-pay

## 2021-10-13 DIAGNOSIS — R262 Difficulty in walking, not elsewhere classified: Secondary | ICD-10-CM

## 2021-10-13 DIAGNOSIS — M25561 Pain in right knee: Secondary | ICD-10-CM | POA: Diagnosis not present

## 2021-10-13 DIAGNOSIS — M25661 Stiffness of right knee, not elsewhere classified: Secondary | ICD-10-CM

## 2021-10-13 DIAGNOSIS — M6281 Muscle weakness (generalized): Secondary | ICD-10-CM

## 2021-10-13 NOTE — Therapy (Signed)
[facility] ?[address] ?[address] ?[place], [name], [address] ?Phone: [phone]   Fax:  [phone] ? ?Physical Therapy Treatment ? ?Patient Details  ?Name: Dwayne Sawyer ?MRN: [id number] ?Date of Birth: [birth year] ?Referring Provider (PT): Dwana Melena ? ? ?Encounter Date: 10/13/2021 ? ? PT End of Session - 10/13/21 5027   ? ? Visit Number 5   ? Date for PT Re-Evaluation 11/10/21   ? Authorization Type [facility]   ? Authorization Time Period 09/29/21 to 11/10/21   ? PT Start Time 0845   ? PT Stop Time 0930   ? PT Time Calculation (min) 45 min   ? Activity Tolerance Patient tolerated treatment well   ? Behavior During Therapy [facility] for tasks assessed/performed   ? ?  ?  ? ?  ? ? ?Past Medical History:  ?Diagnosis Date  ? Arthritis   ? Cancer [facility]) 2020  ? liver  ? Diabetes mellitus without complication ([place])   ? type 2- pt was told he was pre-diabetic  ? Family history of carcinoid tumor   ? High cholesterol   ? Hypertension   ? ? ?Past Surgical History:  ?Procedure Laterality Date  ? EYE SURGERY Right   ? pt says we he was a baby surgery had to be done on one eye "because it wasn't as strong as the other"  ? HERNIA REPAIR  1988  ? inguinal- pt doesn't remember which side  ? LAPAROTOMY N/A 06/12/2019  ? Procedure: EXPLORATORY LAPAROTOMY WITH SMALL BOWEL RESECTION;  Surgeon: Johnathan Hausen, MD;  Location: [facility];  Service: General;  Laterality: N/A;  ? SHOULDER SURGERY Right 2001  ? rotator cuff repair  ? SMALL INTESTINE SURGERY  06/12/2019  ? part of ex lap procedure. pt states "3 feet of intestine was removed"  ? TONSILLECTOMY    ? as a child  ? TOTAL KNEE ARTHROPLASTY Right 09/07/2021  ? Procedure: RIGHT TOTAL KNEE ARTHROPLASTY;  Surgeon: Leandrew Koyanagi, MD;  Location: [facility];  Service: Orthopedics;  Laterality: Right;  ? ? ?There were no vitals filed for this visit. ? ? Subjective Assessment - 10/13/21 0846   ? ? Subjective "Going pretty good"   ? Currently in Pain?  Yes   ? Pain Score 2    R quad  ? ?  ?  ? ?  ? ? ? ? ? ? ? ? ? ? ? ? ? ? ? ? ? ? ? ? [facility] Adult PT Treatment/Exercise - 10/13/21 0001   ? ?  ? Knee/Hip Exercises: Aerobic  ? Nustep L3 x 6 min LE only   ?  ? Knee/Hip Exercises: Machines for Strengthening  ? Cybex Leg Press RLE 20lb TKE x10   ?  ? Knee/Hip Exercises: Standing  ? Other Standing Knee Exercises Alt 4in box taps x10   ?  ? Knee/Hip Exercises: Seated  ? Long Arc [employer] sets;10 reps   ? Long Arc Quad Weight 2 lbs.   ? Hamstring Curl Strengthening;Right;2 sets;10 reps   ? Hamstring Limitations green   ? Sit to Sand 2 sets;10 reps;without UE support   first set w/ Ret TKE resistance  ?  ? Knee/Hip Exercises: Supine  ? Short Educational psychologist sets;10 reps   ?  ? Manual Therapy  ? Manual Therapy Soft tissue mobilization;Passive ROM;Muscle Energy Technique   ? Soft tissue mobilization R quad   ? Passive ROM R knee extension and flexion   ?  Muscle Energy Technique Contract relac   ? ?  ?  ? ?  ? ? ? ? ? ? ? ? ? ? ? ? PT Short Term Goals - 09/29/21 1402   ? ?  ? PT SHORT TERM GOAL #1  ? Title Will be compliant with appropriate progressive HEP   ? Time 3   ? Period Weeks   ? Status New   ? Target Date 10/20/21   ?  ? PT SHORT TERM GOAL #2  ? Title R knee AROM to be no more than 5 degrees extension and no less than 105 degrees flexion   ? Time 3   ? Period Weeks   ? Status New   ?  ? PT SHORT TERM GOAL #3  ? Title Will be able to ambulate at least 59f with no device, reciprocal gait pattern and equal step lengths/stance times with quality heel toe pattern   ? Time 3   ? Period Weeks   ? Status New   ?  ? PT SHORT TERM GOAL #4  ? Title Will be independent with edema management program   ? Time 3   ? Period Weeks   ? Status New   ? ?  ?  ? ?  ? ? ? ? PT Long Term Goals - 10/13/21 0921   ? ?  ? PT LONG TERM GOAL #1  ? Title MMT to improve by at least 1 grade in all weak groups   ? Status On-going   ?  ? PT LONG TERM GOAL #2  ? Title Will be  able to reciprocally ascend/descend at least 4 steps without increase in pain and U UE support   ? Status On-going   ? ?  ?  ? ?  ? ? ? ? ? ? ? ? Plan - 10/13/21 08546  ? ? Clinical Impression Statement Pt enters clinic feeling well with little pain in R quad. Some pain reported at end range with PROM. pt gave good effort throughout session. Heavy focus spent with TKE strength and R quad activation. Visual quad quivering noted with focus TKE quad activation. Pt does have better Extension with close chain interventions,   ? Personal Factors and Comorbidities Comorbidity 3+;Finances   ? Examination-Activity Limitations Locomotion Level;Transfers;Sit;Sleep;Squat;Stairs;Stand;Lift   ? Examination-Participation Restrictions Cleaning;Occupation;Community Activity;Shop;Laundry;[employer]Work   ? Stability/Clinical Decision Making Stable/Uncomplicated   ? Rehab Potential Good   ? PT Duration 6 weeks   ? PT Treatment/Interventions ADLs/Self Care Home Management;Cryotherapy;Electrical Stimulation;Iontophoresis 455mml Dexamethasone;Moist Heat;Ultrasound;DME Instruction;Gait training;Stair training;Functional mobility training;Therapeutic activities;Therapeutic exercise;Balance training;Neuromuscular re-education;Patient/family education;Manual techniques;Scar mobilization;Passive range of motion;Dry needling;Energy conservation;Taping;Vasopneumatic Device   ? PT Next Visit Plan progress as able and tolerated, has high co-pay so need to be cognizant of what he is already doing at home/progressions in PT and HEP additions   ? ?  ?  ? ?  ? ? ?Patient will benefit from skilled therapeutic intervention in order to improve the following deficits and impairments:  Abnormal gait, Decreased range of motion, Difficulty walking, Increased muscle spasms, Decreased activity tolerance, Decreased skin integrity, Pain, Decreased scar mobility, Hypomobility, Impaired flexibility, Decreased strength, Decreased mobility, Increased edema ? ?Visit  Diagnosis: ?Acute pain of right knee ? ?Difficulty in walking, not elsewhere classified ? ?Stiffness of right knee, not elsewhere classified ? ?Localized muscle weakness ? ? ? ? ?Problem List ?Patient Active Problem List  ? Diagnosis Date Noted  ? Morbid obesity, associated with hypertension, hyperlipidemia, prediabetes, and  osteoarthritis ([place]) 09/15/2021  ? Status post total right knee replacement 09/07/2021  ? Primary osteoarthritis of right knee 12/30/2020  ? Meniscus, medial, derangement, right 12/30/2020  ? BMI 37.0-37.9, adult 12/10/2020  ? Obstructive sleep apnea syndrome, severe 11/29/2020  ? Hyperlipidemia 09/06/2020  ? Resistant hypertension 09/06/2020  ? Pre-diabetes 09/06/2020  ? 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 Bowel obstruction ([place]) 06/11/2019  ? Rheumatoid arthritis involving multiple sites with positive rheumatoid factor ([place]) 10/13/2015  ? ? ?Scot Jun, PTA ?10/13/2021, 9:24 AM ? ?[facility] ?[place], [name], [address] ?Phone: [phone]   Fax:  [phone] ? ?Name: Dwayne Sawyer ?MRN: [id number] ?Date of Birth: 08-Oct-1970 ? ? ? ?

## 2021-10-17 ENCOUNTER — Other Ambulatory Visit: Payer: Self-pay

## 2021-10-17 ENCOUNTER — Encounter: Payer: Self-pay | Admitting: Physical Therapy

## 2021-10-17 ENCOUNTER — Ambulatory Visit: Payer: 59 | Admitting: Physical Therapy

## 2021-10-17 DIAGNOSIS — R262 Difficulty in walking, not elsewhere classified: Secondary | ICD-10-CM

## 2021-10-17 DIAGNOSIS — M6281 Muscle weakness (generalized): Secondary | ICD-10-CM

## 2021-10-17 DIAGNOSIS — M25661 Stiffness of right knee, not elsewhere classified: Secondary | ICD-10-CM

## 2021-10-17 DIAGNOSIS — M25561 Pain in right knee: Secondary | ICD-10-CM | POA: Diagnosis not present

## 2021-10-17 NOTE — Therapy (Signed)
[place] ?[place] [address] ?[place], [name], [address] ?Phone: [phone]   Fax:  [phone] ? ?Physical Therapy Treatment ? ?Patient Details  ?Name: Dwayne Sawyer ?MRN: [id number] ?Date of Birth: Nov 29, 1970 ?Referring Provider (PT): Dwana Melena ? ? ?Encounter Date: 10/17/2021 ? ? PT End of Session - 10/17/21 9983   ? ? Visit Number 6   ? Date for PT Re-Evaluation 11/10/21   ? Authorization Type [employer]   ? Authorization Time Period 09/29/21 to 11/10/21   ? PT Start Time 0845   ? PT Stop Time 0930   ? PT Time Calculation (min) 45 min   ? Activity Tolerance Patient tolerated treatment well   ? Behavior During Therapy [facility] for tasks assessed/performed   ? ?  ?  ? ?  ? ? ?Past Medical History:  ?Diagnosis Date  ? Arthritis   ? Cancer [facility] 2020  ? liver  ? Diabetes mellitus without complication ([place])   ? type 2- pt was told he was pre-diabetic  ? Family history of carcinoid tumor   ? High cholesterol   ? Hypertension   ? ? ?Past Surgical History:  ?Procedure Laterality Date  ? EYE SURGERY Right   ? pt says we he was a baby surgery had to be done on one eye "because it wasn't as strong as the other"  ? HERNIA REPAIR  1988  ? inguinal- pt doesn't remember which side  ? LAPAROTOMY N/A 06/12/2019  ? Procedure: EXPLORATORY LAPAROTOMY WITH SMALL BOWEL RESECTION;  Surgeon: Johnathan Hausen, MD;  [facility];  Service: General;  Laterality: N/A;  ? SHOULDER SURGERY Right 2001  ? rotator cuff repair  ? SMALL INTESTINE SURGERY  06/12/2019  ? part of ex lap procedure. pt states "3 feet of intestine was removed"  ? TONSILLECTOMY    ? as a child  ? TOTAL KNEE ARTHROPLASTY Right 09/07/2021  ? Procedure: RIGHT TOTAL KNEE ARTHROPLASTY;  Surgeon: Leandrew Koyanagi, MD;  Location: [place];  Service: Orthopedics;  Laterality: Right;  ? ? ?There were no vitals filed for this visit. ? ? Subjective Assessment - 10/17/21 0844   ? ? Subjective "Hanging in their" knee is good   ? Currently  in Pain? No/denies   ? ?  ?  ? ?  ? ? ? ? ? [facility] PT Assessment - 10/17/21 0001   ? ?  ? AROM  ? Right Knee Extension 15   against gravity  ? Right Knee Flexion 92   ? ?  ?  ? ?  ? ? ? ? ? ? ? ? ? ? ? ? ? ? ? ? [facility] Adult PT Treatment/Exercise - 10/17/21 0001   ? ?  ? Knee/Hip Exercises: Aerobic  ? Nustep L3 x 6 min LE only   ?  ? Knee/Hip Exercises: Standing  ? Forward Step Up Right;1 set;10 reps;Hand Hold: 0;Step Height: 4"   ? Walking with Sports Cord forward 20lb x3   ?  ? Knee/Hip Exercises: Seated  ? Long Arc [employer] sets;10 reps   ? Long Arc Quad Weight 3 lbs.   ? Sit to Sand 2 sets;10 reps;without UE support   ?  ? Knee/Hip Exercises: Supine  ? Short Educational psychologist sets;10 reps   ? Short Arc [employer] Limitations 3   ?  ? Manual Therapy  ? Soft tissue mobilization R quad   ? Passive ROM R knee extension and  flexion   ? [employer] relac   ? ?  ?  ? ?  ? ? ? ? ? ? ? ? ? ? ? ? PT Short Term Goals - 10/17/21 2423   ? ?  ? PT SHORT TERM GOAL #1  ? Title Will be compliant with appropriate progressive HEP   ? Status Achieved   ?  ? PT SHORT TERM GOAL #2  ? Title R knee AROM to be no more than 5 degrees extension and no less than 105 degrees flexion   ? Status On-going   ?  ? PT SHORT TERM GOAL #3  ? Title Will be able to ambulate at least 558f with no device, reciprocal gait pattern and equal step lengths/stance times with quality heel toe pattern   ? Status On-going   ?  ? PT SHORT TERM GOAL #4  ? Title Will be independent with edema management program   ? Status Achieved   ? ?  ?  ? ?  ? ? ? ? PT Long Term Goals - 10/17/21 0922   ? ?  ? PT LONG TERM GOAL #1  ? Title MMT to improve by at least 1 grade in all weak groups   ? Status On-going   ?  ? PT LONG TERM GOAL #2  ? Title Will be able to reciprocally ascend/descend at least 4 steps without increase in pain and U UE support   ? Status On-going   ?  ? PT LONG TERM GOAL #3  ? Title Will be able to ambulate over  uneven surfaces such as grass or gravel with LRAD and no unsteadiness to show improved functional balance   ? Status On-going   ?  ? PT LONG TERM GOAL #4  ? Title Pain in R knee to be no more than 2/10 with all functional weight bearing activities at work and at home   ? Status Partially Met   ? ?  ?  ? ?  ? ? ? ? ? ? ? ? Plan - 10/17/21 0923   ? ? Clinical Impression Statement Overall pt is doing well, but continues to lack some R knee flexion and extensions. Good effort with all interventions, Some end rang pain with both passive flexion and extension. Again focus spent of R knee TKE strength with end range holds. Cue to prevent circumduction with step ups. No AD use with mobility around clinic, pt ambulated with decrease stance time on RLE.   ? Personal Factors and Comorbidities Comorbidity 3+;Finances   ? Examination-Activity Limitations Locomotion Level;Transfers;Sit;Sleep;Squat;Stairs;Stand;Lift   ? Examination-Participation Restrictions Cleaning;Occupation;Community Activity;Shop;Laundry;YValla LeaverWork   ? Stability/Clinical Decision Making Stable/Uncomplicated   ? Rehab Potential Good   ? PT Duration 6 weeks   ? PT Treatment/Interventions ADLs/Self Care Home Management;Cryotherapy;Electrical Stimulation;Iontophoresis 4100mml Dexamethasone;Moist Heat;Ultrasound;DME Instruction;Gait training;Stair training;Functional mobility training;Therapeutic activities;Therapeutic exercise;Balance training;Neuromuscular re-education;Patient/family education;Manual techniques;Scar mobilization;Passive range of motion;Dry needling;Energy conservation;Taping;Vasopneumatic Device   ? PT Next Visit Plan progress as able and tolerated, has high co-pay so need to be cognizant of what he is already doing at home/progressions in PT and HEP additions   ? ?  ?  ? ?  ? ? ?Patient will benefit from skilled therapeutic intervention in order to improve the following deficits and impairments:  Abnormal gait, Decreased range of motion,  Difficulty walking, Increased muscle spasms, Decreased activity tolerance, Decreased skin integrity, Pain, Decreased scar mobility, Hypomobility, Impaired flexibility, Decreased strength, Decreased mobility, Increased edema ? ?Visit  Diagnosis: ?Acute pain of right knee ? ?Difficulty in walking, not elsewhere classified ? ?Localized muscle weakness ? ?Stiffness of right knee, not elsewhere classified ? ? ? ? ?Problem List ?Patient Active Problem List  ? Diagnosis Date Noted  ? Morbid obesity, associated with hypertension, hyperlipidemia, prediabetes, and osteoarthritis ([place]) 09/15/2021  ? Status post total right knee replacement 09/07/2021  ? Primary osteoarthritis of right knee 12/30/2020  ? Meniscus, medial, derangement, right 12/30/2020  ? BMI 37.0-37.9, adult 12/10/2020  ? Obstructive sleep apnea syndrome, severe 11/29/2020  ? Hyperlipidemia 09/06/2020  ? Resistant hypertension 09/06/2020  ? Pre-diabetes 09/06/2020  ? 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 Bowel obstruction ([place]) 06/11/2019  ? Rheumatoid arthritis involving multiple sites with positive rheumatoid factor ([place]) 10/13/2015  ? ? ?Scot Jun, PTA ?10/17/2021, 9:26 AM ? ?[place] ?[address] ?[address] ?[place], [name], [address] ?Phone: [phone]   Fax:  [phone] ? ?Name: Hurschel Paynter ?MRN: [id number] ?Date of Birth: [birth year] ? ? ? ?

## 2021-10-18 ENCOUNTER — Encounter: Payer: Self-pay | Admitting: Orthopaedic Surgery

## 2021-10-18 ENCOUNTER — Ambulatory Visit (INDEPENDENT_AMBULATORY_CARE_PROVIDER_SITE_OTHER): Payer: 59

## 2021-10-18 ENCOUNTER — Ambulatory Visit (INDEPENDENT_AMBULATORY_CARE_PROVIDER_SITE_OTHER): Payer: 59 | Admitting: Orthopaedic Surgery

## 2021-10-18 DIAGNOSIS — Z96651 Presence of right artificial knee joint: Secondary | ICD-10-CM

## 2021-10-18 NOTE — Progress Notes (Signed)
? ?Post-Op Visit Note ?  ?Patient: Dwayne Sawyer           ?Date of Birth: 1970/10/22           ?MRN: [id number] ?Visit Date: 10/18/2021 ?PCP: [name], MD ? ? ?Assessment & Plan: ? ?Chief Complaint:  ?Chief Complaint  ?Patient presents with  ? Right Knee - Pain, Routine Post Op, Follow-up  ? ?Visit Diagnoses:  ?1. Hx of total knee replacement, right   ? ? ?Plan: Dwayne Sawyer is 6 weeks status post right total knee replacement.  Overall he is doing well.  He is in outpatient PT twice a week at [place] location.  He takes only Tylenol and tramadol as needed.  He has been ambulating with a single-point cane in public.  He is back to driving. ? ?Examination of right knee shows a fully healed surgical scar.  Range of motion is approximately 10 to 95 degrees.  Stable to varus valgus.  No evidence of infection. ? ?Dwayne Sawyer is recovering appropriately from the surgery.  He will continue to focus on regaining range of motion through outpatient PT and home exercises.  Dental prophylaxis reinforced.  He may discontinue DVT prophylaxis at this time.  Recheck in 6 weeks for 37-monthvisit. ? ?Follow-Up Instructions: Return in about 6 weeks (around 11/29/2021).  ? ?Orders:  ?Orders Placed This Encounter  ?Procedures  ? XR Knee 1-2 Views Right  ? ?No orders of the defined types were placed in this encounter. ? ? ?Imaging: ?XR Knee 1-2 Views Right ? ?Result Date: 10/18/2021 ?Stable total knee replacement in good alignment   ? ?PMFS History: ?Patient Active Problem List  ? Diagnosis Date Noted  ? Morbid obesity, associated with hypertension, hyperlipidemia, prediabetes, and osteoarthritis ([place] 09/15/2021  ? Status post total right knee replacement 09/07/2021  ? Primary osteoarthritis of right knee 12/30/2020  ? Meniscus, medial, derangement, right 12/30/2020  ? BMI 37.0-37.9, adult 12/10/2020  ? Obstructive sleep apnea syndrome, severe 11/29/2020  ? Hyperlipidemia 09/06/2020  ? Resistant hypertension 09/06/2020  ? Pre-diabetes  09/06/2020  ? 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 Bowel obstruction ([facility] 06/11/2019  ? Rheumatoid arthritis involving multiple sites with positive rheumatoid factor ([facility] 10/13/2015  ? ?Past Medical History:  ?Diagnosis Date  ? Arthritis   ? Cancer [facility] 2020  ? liver  ? Diabetes mellitus without complication ([place]   ? type 2- pt was told he was pre-diabetic  ? Family history of carcinoid tumor   ? High cholesterol   ? Hypertension   ?  ?Family History  ?Problem Relation Age of Onset  ? Cancer Father   ?     widespread carcinoid cancer  ? Hypertension Mother   ? Hyperlipidemia Mother   ?  ?Past Surgical History:  ?Procedure Laterality Date  ? EYE SURGERY Right   ? pt says we he was a baby surgery had to be done on one eye "because it wasn't as strong as the other"  ? HERNIA REPAIR  1988  ? inguinal- pt doesn't remember which side  ? LAPAROTOMY N/A 06/12/2019  ? Procedure: EXPLORATORY LAPAROTOMY WITH SMALL BOWEL RESECTION;  Surgeon: MJohnathan Hausen MD;  [facility];  Service: General;  Laterality: N/A;  ? SHOULDER SURGERY Right 2001  ? rotator cuff repair  ? SMALL INTESTINE SURGERY  06/12/2019  ? part of ex lap procedure. pt states "3 feet of intestine was removed"  ? TONSILLECTOMY    ? as a child  ?  TOTAL KNEE ARTHROPLASTY Right 09/07/2021  ? Procedure: RIGHT TOTAL KNEE ARTHROPLASTY;  Surgeon: Leandrew Koyanagi, MD;  Location: [place] [facility];  Service: Orthopedics;  Laterality: Right;  ? ?Social History  ? ?Occupational History  ? Not on file  ?Tobacco Use  ? Smoking status: Never  ? Smokeless tobacco: Former  ?  Types: Chew  ?  Quit date: 2020  ?Vaping Use  ? Vaping Use: Never used  ?Substance and Sexual Activity  ? Alcohol use: Yes  ?  Comment: very occasionally (less than a 6 pack of beer a year)  ? Drug use: Never  ? Sexual activity: Not on file  ? ? ? ?

## 2021-10-19 ENCOUNTER — Encounter: Payer: 59 | Admitting: Physical Therapy

## 2021-10-19 ENCOUNTER — Inpatient Hospital Stay: Payer: 59

## 2021-10-19 ENCOUNTER — Encounter: Payer: Self-pay | Admitting: Oncology

## 2021-10-19 ENCOUNTER — Other Ambulatory Visit: Payer: Self-pay

## 2021-10-19 DIAGNOSIS — C7A8 Other malignant neuroendocrine tumors: Secondary | ICD-10-CM | POA: Diagnosis not present

## 2021-10-19 DIAGNOSIS — D3A011 Benign carcinoid tumor of the jejunum: Secondary | ICD-10-CM

## 2021-10-19 DIAGNOSIS — D3A098 Benign carcinoid tumors of other sites: Secondary | ICD-10-CM

## 2021-10-19 DIAGNOSIS — C7A011 Malignant carcinoid tumor of the jejunum: Secondary | ICD-10-CM

## 2021-10-19 LAB — CMP (CANCER CENTER ONLY)
ALT: 13 U/L (ref 0–44)
AST: 13 U/L — ABNORMAL LOW (ref 15–41)
Albumin: 3.9 g/dL (ref 3.5–5.0)
Alkaline Phosphatase: 91 U/L (ref 38–126)
Anion gap: 9 (ref 5–15)
BUN: 24 mg/dL — ABNORMAL HIGH (ref 6–20)
CO2: 28 mmol/L (ref 22–32)
Calcium: 9.4 mg/dL (ref 8.9–10.3)
Chloride: 102 mmol/L (ref 98–111)
Creatinine: 1.3 mg/dL — ABNORMAL HIGH (ref 0.61–1.24)
GFR, Estimated: >60 mL/min (ref >60)
Glucose, Bld: 174 mg/dL — ABNORMAL HIGH (ref 70–99)
Potassium: 3.8 mmol/L (ref 3.5–5.1)
Sodium: 139 mmol/L (ref 135–145)
Total Bilirubin: 0.4 mg/dL (ref 0.3–1.2)
Total Protein: 7.5 g/dL (ref 6.5–8.1)

## 2021-10-19 LAB — CBC WITH DIFFERENTIAL (CANCER CENTER ONLY)
Abs Immature Granulocytes: 0.03 10*3/uL (ref 0.00–0.07)
Basophils Absolute: 0 10*3/uL (ref 0.0–0.1)
Basophils Relative: 1 %
Eosinophils Absolute: 0.2 10*3/uL (ref 0.0–0.5)
Eosinophils Relative: 3 %
HCT: 33.1 % — ABNORMAL LOW (ref 39.0–52.0)
Hemoglobin: 11.2 g/dL — ABNORMAL LOW (ref 13.0–17.0)
Immature Granulocytes: 0 %
Lymphocytes Relative: 12 %
Lymphs Abs: 1 10*3/uL (ref 0.7–4.0)
MCH: 29.7 pg (ref 26.0–34.0)
MCHC: 33.8 g/dL (ref 30.0–36.0)
MCV: 87.8 fL (ref 80.0–100.0)
Monocytes Absolute: 0.5 10*3/uL (ref 0.1–1.0)
Monocytes Relative: 6 %
Neutro Abs: 6.3 10*3/uL (ref 1.7–7.7)
Neutrophils Relative %: 78 %
Platelet Count: 333 10*3/uL (ref 150–400)
RBC: 3.77 MIL/uL — ABNORMAL LOW (ref 4.22–5.81)
RDW: 12.9 % (ref 11.5–15.5)
WBC Count: 8.1 10*3/uL (ref 4.0–10.5)
nRBC: 0 % (ref 0.0–0.2)

## 2021-10-20 ENCOUNTER — Ambulatory Visit: Payer: 59 | Admitting: Physical Therapy

## 2021-10-20 ENCOUNTER — Encounter: Payer: Self-pay | Admitting: Physical Therapy

## 2021-10-20 DIAGNOSIS — M25561 Pain in right knee: Secondary | ICD-10-CM | POA: Diagnosis not present

## 2021-10-20 DIAGNOSIS — R262 Difficulty in walking, not elsewhere classified: Secondary | ICD-10-CM

## 2021-10-20 DIAGNOSIS — M6281 Muscle weakness (generalized): Secondary | ICD-10-CM

## 2021-10-20 DIAGNOSIS — M25661 Stiffness of right knee, not elsewhere classified: Secondary | ICD-10-CM

## 2021-10-20 NOTE — Therapy (Signed)
[facility] ?[place] ?[address] ?[place], [name], [address] ?Phone: [phone]   Fax:  [phone] ? ?Physical Therapy Treatment ? ?Patient Details  ?Name: Dwayne Sawyer ?MRN: [id number] ?Date of Birth: [birth year] ?Referring Provider (PT): Dwana Melena ? ? ?Encounter Date: 10/20/2021 ? ? PT End of Session - 10/20/21 0936   ? ? Visit Number 7   ? Number of Visits 10   ? Date for PT Re-Evaluation 11/10/21   ? Authorization Type [employer]   ? Authorization Time Period 09/29/21 to 11/10/21   ? Authorization - Number of Visits 30   ? PT Start Time 0848   ? PT Stop Time 2549   ? PT Time Calculation (min) 39 min   ? Activity Tolerance Patient tolerated treatment well   ? Behavior During Therapy [facility] for tasks assessed/performed   ? ?  ?  ? ?  ? ? ?Past Medical History:  ?Diagnosis Date  ? Arthritis   ? Cancer [facility]) 2020  ? liver  ? Diabetes mellitus without complication ([place])   ? type 2- pt was told he was pre-diabetic  ? Family history of carcinoid tumor   ? High cholesterol   ? Hypertension   ? ? ?Past Surgical History:  ?Procedure Laterality Date  ? EYE SURGERY Right   ? pt says we he was a baby surgery had to be done on one eye "because it wasn't as strong as the other"  ? HERNIA REPAIR  1988  ? inguinal- pt doesn't remember which side  ? LAPAROTOMY N/A 06/12/2019  ? Procedure: EXPLORATORY LAPAROTOMY WITH SMALL BOWEL RESECTION;  Surgeon: Johnathan Hausen, MD;  [facility];  Service: General;  Laterality: N/A;  ? SHOULDER SURGERY Right 2001  ? rotator cuff repair  ? SMALL INTESTINE SURGERY  06/12/2019  ? part of ex lap procedure. pt states "3 feet of intestine was removed"  ? TONSILLECTOMY    ? as a child  ? TOTAL KNEE ARTHROPLASTY Right 09/07/2021  ? Procedure: RIGHT TOTAL KNEE ARTHROPLASTY;  Surgeon: Leandrew Koyanagi, MD;  Location: [facility];  Service: Orthopedics;  Laterality: Right;  ? ? ?There were no vitals filed for this visit. ? ? Subjective Assessment - 10/20/21  0849   ? ? Subjective I saw the doctor the other day, he said scar is good and I'm good to drive and take baths. He was OK with how things were looking, he thinks ROM will improve with time. Co-pay is still a challenge, still would like to drop to 1x/week soon   ? Patient Stated Goals work on mobility, improve strength   ? Currently in Pain? Yes   ? Pain Score 1    ? Pain Location Knee   ? Pain Orientation Right   ? Pain Descriptors / Indicators Aching;Dull   ? Pain Type Surgical pain   ? ?  ?  ? ?  ? ? ? ? ? [facility] PT Assessment - 10/20/21 0001   ? ?  ? AROM  ? Right Knee Extension 7   supine  ? Right Knee Flexion 101   seated  ? ?  ?  ? ?  ? ? ? ? ? ? ? ? ? ? ? ? ? ? ? ? [facility] Adult PT Treatment/Exercise - 10/20/21 0001   ? ?  ? Knee/Hip Exercises: Aerobic  ? Recumbent Bike bike seat 11-->9 x6 minutes full rotations   ?  ? Knee/Hip Exercises: Standing  ?  Heel Raises Right;1 set;10 reps   ? Terminal Knee Extension Right;1 set;10 reps   ? Terminal Knee Extension Limitations black TB   ? Forward Step Up Right;1 set;15 reps   ? Step Down Right;1 set;5 reps   ? Step Down Limitations UUE on rail max fcux on quad activation   ? Gait Training gait training x271f no device, cues for increased knee flexion during initial swing phase   ? Other Standing Knee Exercises squats with L LE on 4inch step, cues for max WB R LE 1x15   ?  ? Manual Therapy  ? Joint Mobilization patella mobility all directions   ? Soft tissue mobilization scar mobility all directions, CW/CCW   ? Passive ROM R knee extnesion and R knee flexion   ? ?  ?  ? ?  ? ? ? ? ? ? ? ? ? ? PT Education - 10/20/21 0935   ? ? Education Details encouraged moist heat on quad/ice on knee/elevation of LE during ice to reduce edema, encouraged gym activity   ? Person(s) Educated Patient   ? Methods Explanation   ? Comprehension Verbalized understanding   ? ?  ?  ? ?  ? ? ? PT Short Term Goals - 10/17/21 0922   ? ?  ? PT SHORT TERM GOAL #1  ? Title Will be compliant with  appropriate progressive HEP   ? Status Achieved   ?  ? PT SHORT TERM GOAL #2  ? Title R knee AROM to be no more than 5 degrees extension and no less than 105 degrees flexion   ? Status On-going   ?  ? PT SHORT TERM GOAL #3  ? Title Will be able to ambulate at least 5070fwith no device, reciprocal gait pattern and equal step lengths/stance times with quality heel toe pattern   ? Status On-going   ?  ? PT SHORT TERM GOAL #4  ? Title Will be independent with edema management program   ? Status Achieved   ? ?  ?  ? ?  ? ? ? ? PT Long Term Goals - 10/17/21 0922   ? ?  ? PT LONG TERM GOAL #1  ? Title MMT to improve by at least 1 grade in all weak groups   ? Status On-going   ?  ? PT LONG TERM GOAL #2  ? Title Will be able to reciprocally ascend/descend at least 4 steps without increase in pain and U UE support   ? Status On-going   ?  ? PT LONG TERM GOAL #3  ? Title Will be able to ambulate over uneven surfaces such as grass or gravel with LRAD and no unsteadiness to show improved functional balance   ? Status On-going   ?  ? PT LONG TERM GOAL #4  ? Title Pain in R knee to be no more than 2/10 with all functional weight bearing activities at work and at home   ? Status Partially Met   ? ?  ?  ? ?  ? ? ? ? ? ? ? ? Plan - 10/20/21 0936   ? ? Clinical Impression Statement JaDarusrrives today doing well, MD is happy with his progress but ROM continues to be limited we will continue to focus on and progress this. I was able to progress ROM somewhat today, he has been working really  hard on his knee at the gym, wants to generally drop to 1x/week due to  high copay at this point. Worked on manual interventions as well as progressing quad/glute/CKC strength today. Will continue to do our best with him given drop in frequency of sessions.   ? Personal Factors and Comorbidities Comorbidity 3+;Finances   ? Examination-Activity Limitations Locomotion Level;Transfers;Sit;Sleep;Squat;Stairs;Stand;Lift   ? Examination-Participation  Restrictions Cleaning;Occupation;Community Activity;Shop;Laundry;Valla Leaver Work   ? Stability/Clinical Decision Making Stable/Uncomplicated   ? Clinical Decision Making Low   ? Rehab Potential Good   ? PT Frequency Other (comment)   ? PT Duration 6 weeks   ? PT Treatment/Interventions ADLs/Self Care Home Management;Cryotherapy;Electrical Stimulation;Iontophoresis 51m/ml Dexamethasone;Moist Heat;Ultrasound;DME Instruction;Gait training;Stair training;Functional mobility training;Therapeutic activities;Therapeutic exercise;Balance training;Neuromuscular re-education;Patient/family education;Manual techniques;Scar mobilization;Passive range of motion;Dry needling;Energy conservation;Taping;Vasopneumatic Device   ? PT Next Visit Plan progress as able and tolerated, has high co-pay so need to be cognizant of what he is already doing at home/progressions in PT and HEP additions   ? PT Home Exercise Plan [id number]   ? Consulted and Agree with Plan of Care Patient   ? ?  ?  ? ?  ? ? ?Patient will benefit from skilled therapeutic intervention in order to improve the following deficits and impairments:  Abnormal gait, Decreased range of motion, Difficulty walking, Increased muscle spasms, Decreased activity tolerance, Decreased skin integrity, Pain, Decreased scar mobility, Hypomobility, Impaired flexibility, Decreased strength, Decreased mobility, Increased edema ? ?Visit Diagnosis: ?Acute pain of right knee ? ?Localized muscle weakness ? ?Difficulty in walking, not elsewhere classified ? ?Stiffness of right knee, not elsewhere classified ? ? ? ? ?Problem List ?Patient Active Problem List  ? Diagnosis Date Noted  ? Morbid obesity, associated with hypertension, hyperlipidemia, prediabetes, and osteoarthritis ([facility] 09/15/2021  ? Status post total right knee replacement 09/07/2021  ? Primary osteoarthritis of right knee 12/30/2020  ? Meniscus, medial, derangement, right 12/30/2020  ? BMI 37.0-37.9, adult 12/10/2020  ? Obstructive  sleep apnea syndrome, severe 11/29/2020  ? Hyperlipidemia 09/06/2020  ? Resistant hypertension 09/06/2020  ? Pre-diabetes 09/06/2020  ? 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 Bowel obstruction ([place] 11/1

## 2021-10-21 LAB — CHROMOGRANIN A: Chromogranin A (ng/mL): 61.7 ng/mL (ref 0.0–101.8)

## 2021-10-24 ENCOUNTER — Encounter: Payer: Self-pay | Admitting: Physical Therapy

## 2021-10-24 ENCOUNTER — Ambulatory Visit: Payer: 59 | Attending: Physician Assistant | Admitting: Physical Therapy

## 2021-10-24 DIAGNOSIS — M25661 Stiffness of right knee, not elsewhere classified: Secondary | ICD-10-CM | POA: Insufficient documentation

## 2021-10-24 DIAGNOSIS — R262 Difficulty in walking, not elsewhere classified: Secondary | ICD-10-CM | POA: Diagnosis present

## 2021-10-24 DIAGNOSIS — M6281 Muscle weakness (generalized): Secondary | ICD-10-CM | POA: Insufficient documentation

## 2021-10-24 DIAGNOSIS — M25561 Pain in right knee: Secondary | ICD-10-CM | POA: Insufficient documentation

## 2021-10-24 NOTE — Therapy (Signed)
[facility] ?[place], [name], [address] ?Phone: [phone]   Fax:  [phone] ? ?Physical Therapy Treatment ? ?Patient Details  ?Name: Dwayne Sawyer ?MRN: [id number] ?Date of Birth: 05/10/1971 ?Referring Provider (PT): Dwana Melena ? ? ?Encounter Date: 10/24/2021 ? ? PT End of Session - 10/24/21 1023   ? ? Visit Number 8   ? Date for PT Re-Evaluation 11/10/21   ? Authorization Type [employer]   ? PT Start Time [phone]   ? PT Stop Time 1018   ? PT Time Calculation (min) 50 min   ? Activity Tolerance Patient tolerated treatment well   ? Behavior During Therapy [facility] for tasks assessed/performed   ? ?  ?  ? ?  ? ? ?Past Medical History:  ?Diagnosis Date  ? Arthritis   ? Cancer [facility]) 2020  ? liver  ? Diabetes mellitus without complication ([place])   ? type 2- pt was told he was pre-diabetic  ? Family history of carcinoid tumor   ? High cholesterol   ? Hypertension   ? ? ?Past Surgical History:  ?Procedure Laterality Date  ? EYE SURGERY Right   ? pt says we he was a baby surgery had to be done on one eye "because it wasn't as strong as the other"  ? HERNIA REPAIR  1988  ? inguinal- pt doesn't remember which side  ? LAPAROTOMY N/A 06/12/2019  ? Procedure: EXPLORATORY LAPAROTOMY WITH SMALL BOWEL RESECTION;  Surgeon: Johnathan Hausen, MD;  [facility];  Service: General;  Laterality: N/A;  ? SHOULDER SURGERY Right 2001  ? rotator cuff repair  ? SMALL INTESTINE SURGERY  06/12/2019  ? part of ex lap procedure. pt states "3 feet of intestine was removed"  ? TONSILLECTOMY    ? as a child  ? TOTAL KNEE ARTHROPLASTY Right 09/07/2021  ? Procedure: RIGHT TOTAL KNEE ARTHROPLASTY;  Surgeon: Leandrew Koyanagi, MD;  Location: [place];  Service: Orthopedics;  Laterality: Right;  ? ? ?There were no vitals filed for this visit. ? ? Subjective Assessment - 10/24/21 0934   ? ? Subjective Patient reports that he did not have a good night, reports that he is really stiff, he walks in  with a significant limp   ? Currently in Pain? Yes   ? Pain Score 2    ? Pain Location Knee   ? Pain Orientation Right   ? Pain Descriptors / Indicators Tightness   ? ?  ?  ? ?  ? ? ? ? ? ? ? ? ? ? ? ? ? ? ? ? ? ? ? ? [place] Adult PT Treatment/Exercise - 10/24/21 0001   ? ?  ? Ambulation/Gait  ? Gait Comments walking outside with [facility], negotiated curbs and some walking in the grass, reports does not trust the leg, 4" steps step over step up and down.   ?  ? High Level Balance  ? High Level Balance Comments on airex marching, ball toss, cone toe taps   ?  ? Knee/Hip Exercises: Aerobic  ? Recumbent Bike x 6 minutes seat position 9   ? Nustep level 4 x 6 mins   ?  ? Knee/Hip Exercises: Machines for Strengthening  ? Cybex Knee Extension 5# 2x10 both legs   ? Cybex Knee Flexion 25# 2x10 both legs   ?  ? Manual Therapy  ? Soft tissue mobilization scar mobility all directions, CW/CCW   ? Passive ROM R  knee extnesion and R knee flexion   ? ?  ?  ? ?  ? ? ? ? ? ? ? ? ? ? ? ? PT Short Term Goals - 10/17/21 0922   ? ?  ? PT SHORT TERM GOAL #1  ? Title Will be compliant with appropriate progressive HEP   ? Status Achieved   ?  ? PT SHORT TERM GOAL #2  ? Title R knee AROM to be no more than 5 degrees extension and no less than 105 degrees flexion   ? Status On-going   ?  ? PT SHORT TERM GOAL #3  ? Title Will be able to ambulate at least 573f with no device, reciprocal gait pattern and equal step lengths/stance times with quality heel toe pattern   ? Status On-going   ?  ? PT SHORT TERM GOAL #4  ? Title Will be independent with edema management program   ? Status Achieved   ? ?  ?  ? ?  ? ? ? ? PT Long Term Goals - 10/24/21 1027   ? ?  ? PT LONG TERM GOAL #1  ? Title MMT to improve by at least 1 grade in all weak groups   ? Status On-going   ?  ? PT LONG TERM GOAL #2  ? Title Will be able to reciprocally ascend/descend at least 4 steps without increase in pain and U UE support   ? Status On-going   ? ?  ?  ? ?  ? ? ? ? ? ? ? ? Plan  - 10/24/21 1023   ? ? Clinical Impression Statement Patient with significant antalgic limp, when I ask him about pain he says no, I asked about habit and he reports that he does not trust the leg, we did a little more proprioception and balance today, he struggles with 4" steps step over step.  Lacks strength and ROM.   ? PT Next Visit Plan progress as able and tolerated, has high co-pay so need to be cognizant of what he is already doing at home/progressions in PT and HEP additions   ? Consulted and Agree with Plan of Care Patient   ? ?  ?  ? ?  ? ? ?Patient will benefit from skilled therapeutic intervention in order to improve the following deficits and impairments:  Abnormal gait, Decreased range of motion, Difficulty walking, Increased muscle spasms, Decreased activity tolerance, Decreased skin integrity, Pain, Decreased scar mobility, Hypomobility, Impaired flexibility, Decreased strength, Decreased mobility, Increased edema ? ?Visit Diagnosis: ?Acute pain of right knee ? ?Localized muscle weakness ? ?Difficulty in walking, not elsewhere classified ? ?Stiffness of right knee, not elsewhere classified ? ? ? ? ?Problem List ?Patient Active Problem List  ? Diagnosis Date Noted  ? Morbid obesity, associated with hypertension, hyperlipidemia, prediabetes, and osteoarthritis ([place] 09/15/2021  ? Status post total right knee replacement 09/07/2021  ? Primary osteoarthritis of right knee 12/30/2020  ? Meniscus, medial, derangement, right 12/30/2020  ? BMI 37.0-37.9, adult 12/10/2020  ? Obstructive sleep apnea syndrome, severe 11/29/2020  ? Hyperlipidemia 09/06/2020  ? Resistant hypertension 09/06/2020  ? Pre-diabetes 09/06/2020  ? 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 Bowel obstruction ([place] 06/11/2019  ? Rheumatoid arthritis involving multiple sites with positive rheumatoid factor ([place] 10/13/2015  ? ? ?Sumner Boast PT ?10/24/2021, 10:27 AM ? ?[place] [address] ?[place] [address]?Phone: [phone]  Fax:  [phone] ? ?Name: Dwayne Sawyer ?MRN: [id number] ?Date of Birth: 17-Nov-1970 ? ? ? ?

## 2021-10-25 ENCOUNTER — Ambulatory Visit (HOSPITAL_COMMUNITY)
Admission: RE | Admit: 2021-10-25 | Discharge: 2021-10-25 | Disposition: A | Discharge status: Home / Self Care | Payer: 59 | Source: Ambulatory Visit | Attending: Hematology | Admitting: Hematology

## 2021-10-25 ENCOUNTER — Other Ambulatory Visit: Payer: Self-pay | Admitting: *Deleted

## 2021-10-25 DIAGNOSIS — D3A011 Benign carcinoid tumor of the jejunum: Secondary | ICD-10-CM | POA: Insufficient documentation

## 2021-10-25 DIAGNOSIS — D3A098 Benign carcinoid tumors of other sites: Secondary | ICD-10-CM

## 2021-10-25 IMAGING — CT CT ABD-PELV W/ CM
2 of 5 series · 16 of 46 positions shown, 18 images · IV contrast (OMNIPAQUE)
Comparison: DOTATATE PET scan [DATE], CT scan [DATE]

CLINICAL DATA: Neuroendocrine tumor with hepatic metastasis.
Patient status post peptide receptor radiotherapy with final
treatment [DATE].

EXAM:
CT ABDOMEN AND PELVIS WITH CONTRAST
TECHNIQUE: Multidetector CT imaging of the abdomen and pelvis was performed
using the standard protocol following bolus administration of
intravenous contrast.

[Series 2: axial st · axial · 0.98mm/px · z∈[+1181,+1606]mm · 13 of 101 slices shown, 15 images]
[im 8/101  soft-tissue]
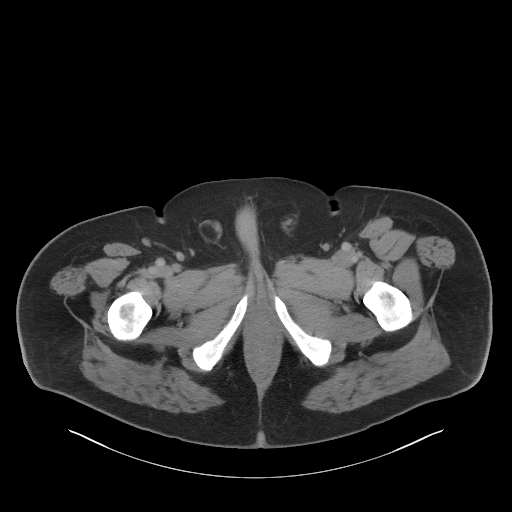
[im 8/101  bone]
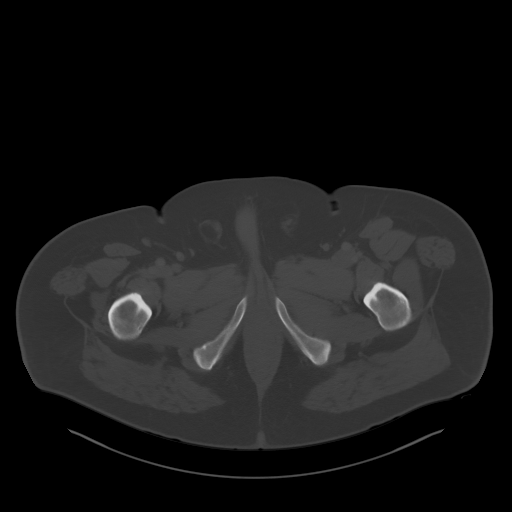
[im 15/101  soft-tissue]
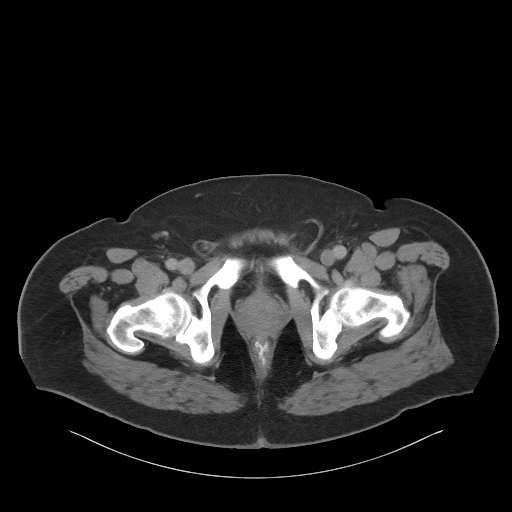
[im 22/101  soft-tissue]
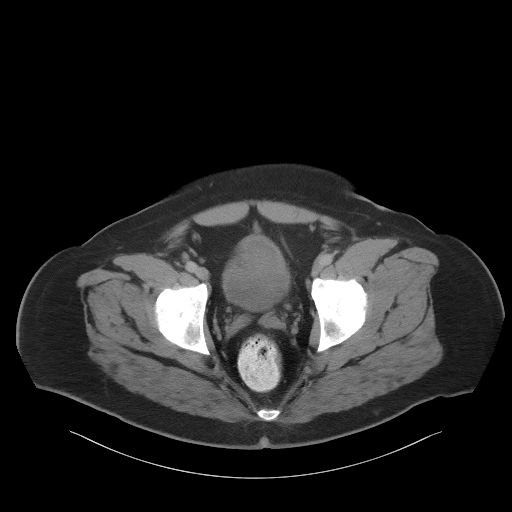
[im 29/101  soft-tissue]
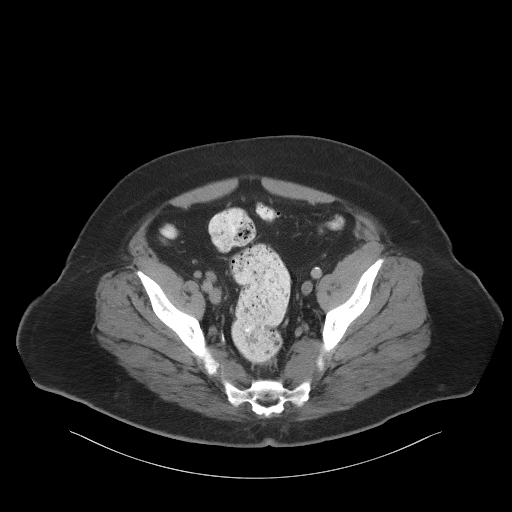
[im 36/101  soft-tissue]
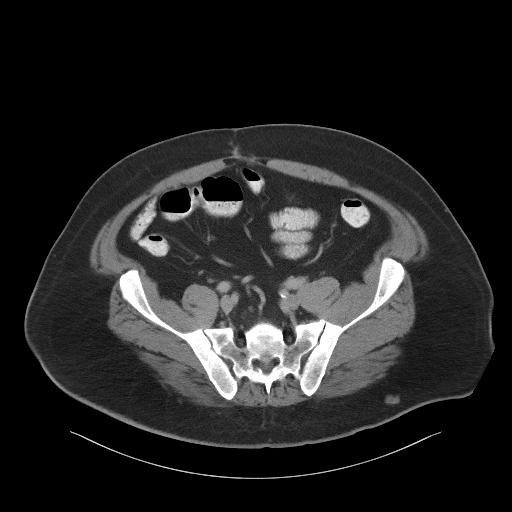
[im 43/101  soft-tissue]
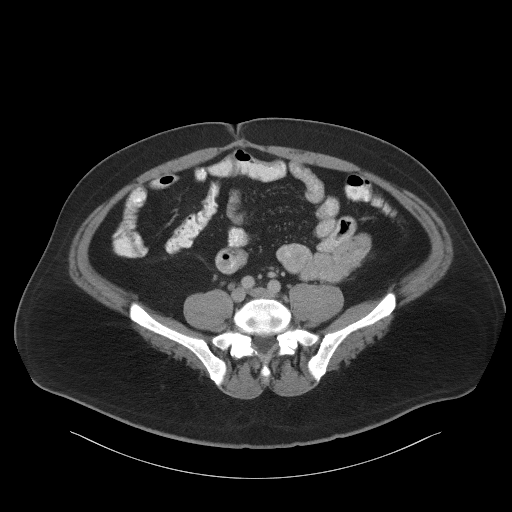
[im 51/101  soft-tissue]
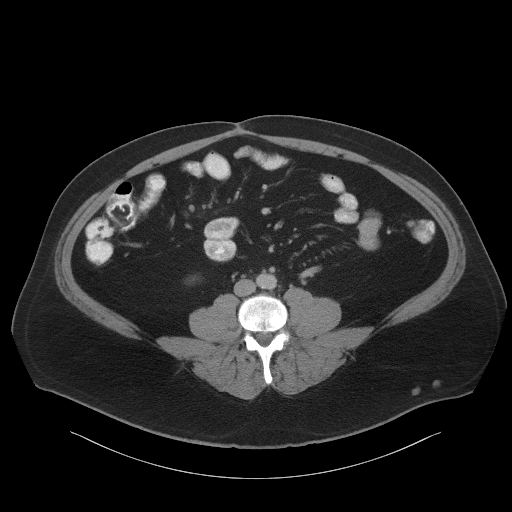
[im 58/101  soft-tissue]
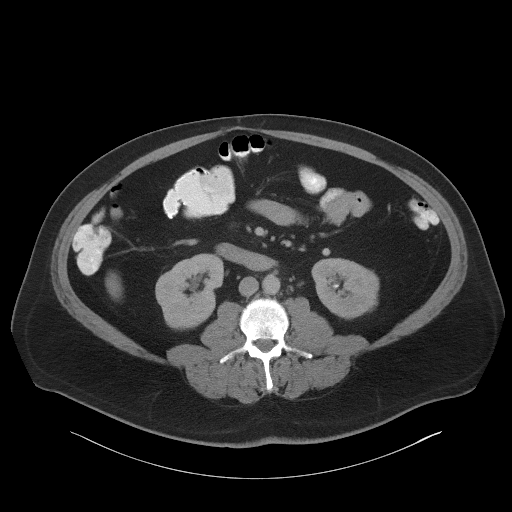
[im 65/101  soft-tissue]
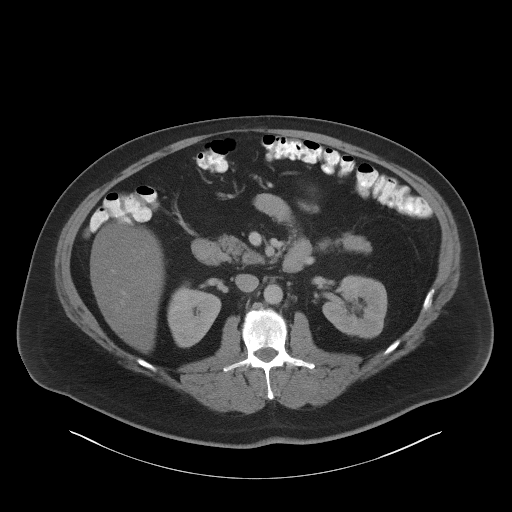
[im 65/101  bone]
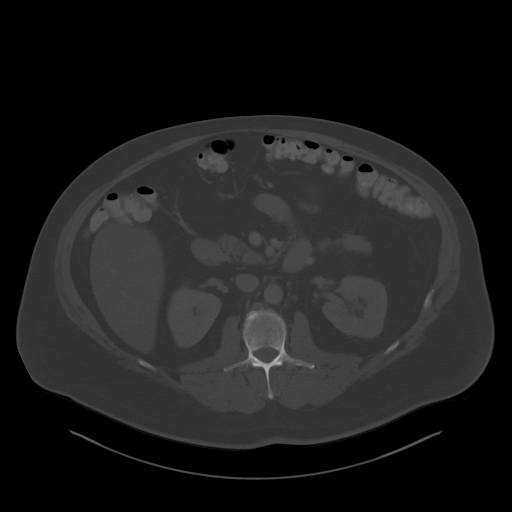
[im 72/101  soft-tissue]
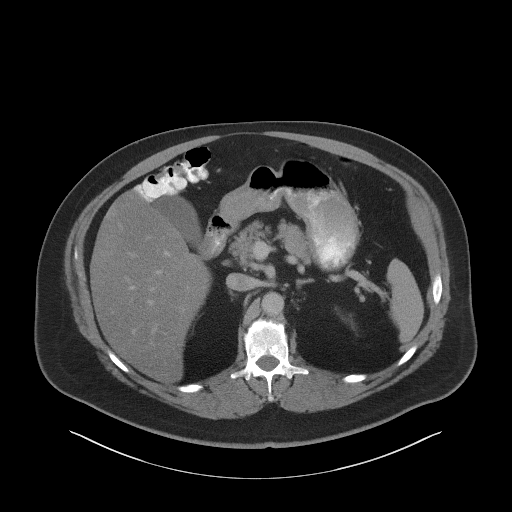
[im 79/101  soft-tissue]
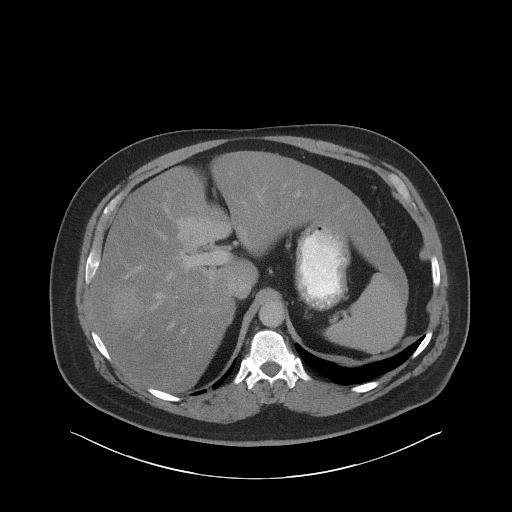
[im 86/101  soft-tissue]
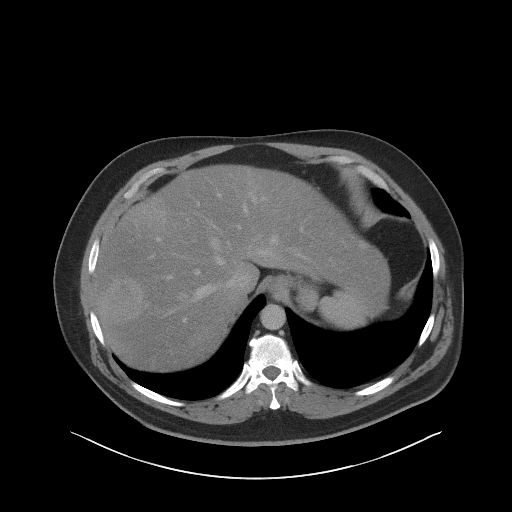
[im 93/101  soft-tissue]
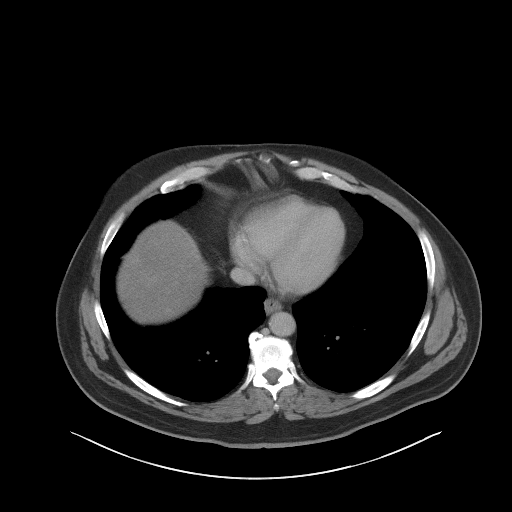

[Series 4: coronal st · coronal · 0.95mm/px · 3 of 110 slices shown]
[im 37/110  soft-tissue]
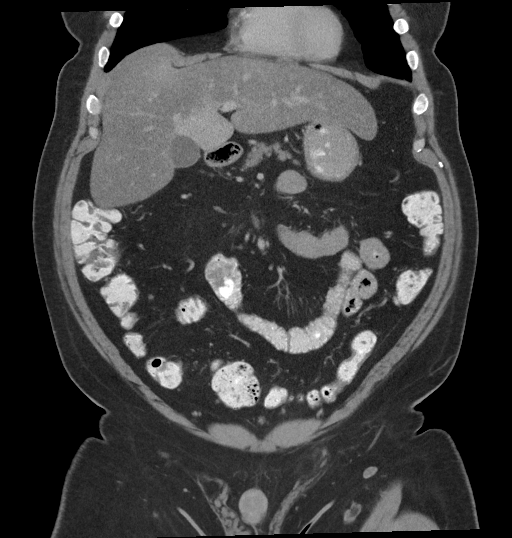
[im 49/110  soft-tissue]
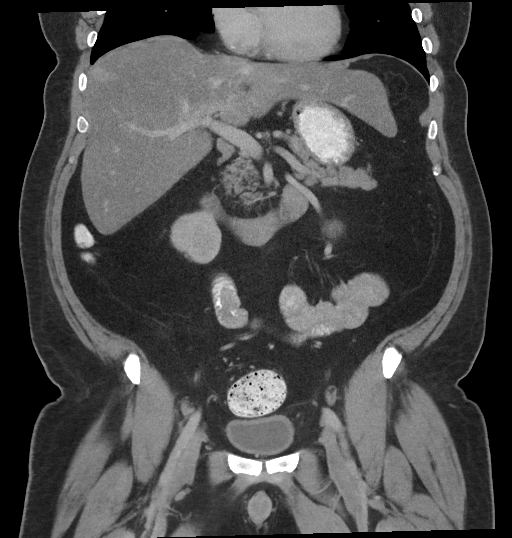
[im 61/110  soft-tissue]
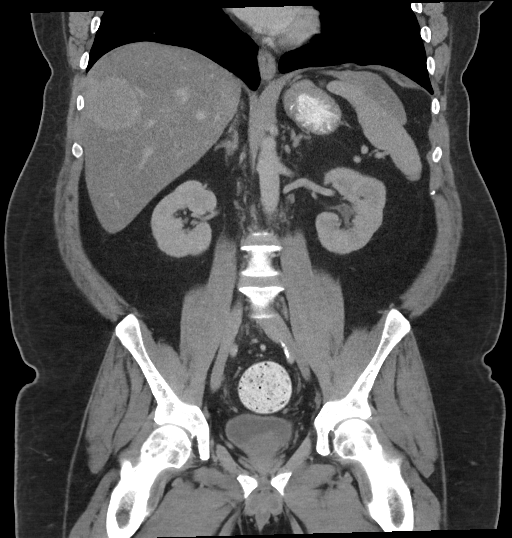

[16 of 46 positions shown; findings below may reference images not displayed]

RADIATION DOSE REDUCTION: This exam was performed according to the
departmental dose-optimization program which includes automated
exposure control, adjustment of the mA and/or kV according to
patient size and/or use of iterative reconstruction technique.

CONTRAST:  80mL OMNIPAQUE IOHEXOL 300 MG/ML  SOLN
FINDINGS: Lower chest: Lung bases are clear.

Hepatobiliary: Severe hepatic steatosis. Dominant rounded lesion in
the RIGHT hepatic lobe measures 6.4 by 4.0 cm compared to 6.0 x
cm remeasured.

Lesion anterior to the dominant lesion measures 1.3 cm (image [DATE])
compares to 8 mm on prior.

Small lesion the dome liver measuring 12 mm (image [DATE]) compares to
12 mm.

Small lesion in the lateral segment of the LEFT hepatic lobe (image
[DATE]) is unchanged.

No new lesions identified.

Pancreas: Pancreas is normal. No ductal dilatation. No pancreatic
inflammation.

Spleen: Normal spleen

Adrenals/urinary tract: Adrenal glands normal. Calculus in lower
pole of the LEFT kidney. No ureterolithiasis or obstructive
uropathy. No bladder calculi

Stomach/Bowel: Stomach, duodenum small-bowel normal. Post RIGHT
hemicolectomy anatomy. LEFT colon normal.

No mesenteric metastasis.

Vascular/Lymphatic: Abdominal aorta is normal caliber. No periportal
or retroperitoneal adenopathy. No pelvic adenopathy.

Reproductive: Unremarkable

Other: No free fluid.

Musculoskeletal: No aggressive osseous lesion.
IMPRESSION: 1. Essentially stable multifocal hepatic metastasis. No new lesions
identified.
2. No evidence of mesenteric metastatic disease or nodal disease.
3. Post RIGHT hemicolectomy anatomy without complication.

## 2021-10-25 MED ORDER — SODIUM CHLORIDE (PF) 0.9 % IJ SOLN
INTRAMUSCULAR | Status: AC
  Filled 2021-10-25: qty 50

## 2021-10-25 MED ORDER — IOHEXOL 300 MG/ML  SOLN
100.0000 mL | Freq: Once | INTRAMUSCULAR | Status: AC | PRN
  Administered 2021-10-25: 80 mL via INTRAVENOUS

## 2021-10-27 ENCOUNTER — Ambulatory Visit: Payer: 59 | Admitting: Physical Therapy

## 2021-10-27 ENCOUNTER — Encounter: Payer: Self-pay | Admitting: Physical Therapy

## 2021-10-27 DIAGNOSIS — M25561 Pain in right knee: Secondary | ICD-10-CM | POA: Diagnosis not present

## 2021-10-27 DIAGNOSIS — R262 Difficulty in walking, not elsewhere classified: Secondary | ICD-10-CM

## 2021-10-27 DIAGNOSIS — M6281 Muscle weakness (generalized): Secondary | ICD-10-CM

## 2021-10-27 DIAGNOSIS — M25661 Stiffness of right knee, not elsewhere classified: Secondary | ICD-10-CM

## 2021-10-27 LAB — 5 HIAA, QUANTITATIVE, URINE, 24 HOUR
5-HIAA, Ur: 7.2 mg/L
5-HIAA,Quant.,24 Hr Urine: 6.5 mg/24 hr (ref 0.0–14.9)
Total Volume: 900

## 2021-10-27 NOTE — Therapy (Signed)
[place] [facility] [address] ?[place], [name], [address] ?Phone: [phone]   Fax:  [phone] ? ?Physical Therapy Treatment ? ?Patient Details  ?Name: Dwayne Sawyer ?MRN: [id number] ?Date of Birth: 1971-02-13 ?Referring Provider (PT): Dwana Melena ? ? ?Encounter Date: 10/27/2021 ? ? PT End of Session - 10/27/21 1659   ? ? Visit Number 9   ? Date for PT Re-Evaluation 11/10/21   ? Authorization Type [employer]   ? Activity Tolerance Patient tolerated treatment well   ? Behavior During Therapy [facility] for tasks assessed/performed   ? ?  ?  ? ?  ? ? ?Past Medical History:  ?Diagnosis Date  ? Arthritis   ? Cancer [facility]) 2020  ? liver  ? Diabetes mellitus without complication ([place])   ? type 2- pt was told he was pre-diabetic  ? Family history of carcinoid tumor   ? High cholesterol   ? Hypertension   ? ? ?Past Surgical History:  ?Procedure Laterality Date  ? EYE SURGERY Right   ? pt says we he was a baby surgery had to be done on one eye "because it wasn't as strong as the other"  ? HERNIA REPAIR  1988  ? inguinal- pt doesn't remember which side  ? LAPAROTOMY N/A 06/12/2019  ? Procedure: EXPLORATORY LAPAROTOMY WITH SMALL BOWEL RESECTION;  Surgeon: Johnathan Hausen, MD;  [facility];  Service: General;  Laterality: N/A;  ? SHOULDER SURGERY Right 2001  ? rotator cuff repair  ? SMALL INTESTINE SURGERY  06/12/2019  ? part of ex lap procedure. pt states "3 feet of intestine was removed"  ? TONSILLECTOMY    ? as a child  ? TOTAL KNEE ARTHROPLASTY Right 09/07/2021  ? Procedure: RIGHT TOTAL KNEE ARTHROPLASTY;  Surgeon: Leandrew Koyanagi, MD;  Location: [place];  Service: Orthopedics;  Laterality: Right;  ? ? ?There were no vitals filed for this visit. ? ? Subjective Assessment - 10/27/21 1607   ? ? Subjective I was pretty sore after the last visit, he comes in without a cane but still significant trendelenberg   ? Currently in Pain? No/denies   ? ?  ?  ? ?   ? ? ? ? ? ? ? ? ? ? ? ? ? ? ? ? ? ? ? ? [place] Adult PT Treatment/Exercise - 10/27/21 0001   ? ?  ? High Level Balance  ? High Level Balance Comments on airex marching, ball toss, cone toe taps   ?  ? Knee/Hip Exercises: Aerobic  ? Recumbent Bike x 6 minutes seat position 9   ? Nustep level 4 x 6 mins   ?  ? Knee/Hip Exercises: Machines for Strengthening  ? Cybex Knee Extension 5# 2x10 both legs, started right Leg eccentrics 2 x 5   ? Cybex Knee Flexion 25# 2x10 both legs   ? Cybex Leg Press 20# x10, then right only for TKE, no wieght both legs for deep flexion   ?  ? Knee/Hip Exercises: Standing  ? Walking with Sports Cord all directions   ?  ? Knee/Hip Exercises: Supine  ? Quad Sets Right;1 set;10 reps   ? Short Educational psychologist sets;10 reps   ? Short Arc Quad Sets Limitations 3   ? ?  ?  ? ?  ? ? ? ? ? ? ? ? ? ? ? ? PT Short Term Goals - 10/17/21 0922   ? ?  ? PT SHORT  TERM GOAL #1  ? Title Will be compliant with appropriate progressive HEP   ? Status Achieved   ?  ? PT SHORT TERM GOAL #2  ? Title R knee AROM to be no more than 5 degrees extension and no less than 105 degrees flexion   ? Status On-going   ?  ? PT SHORT TERM GOAL #3  ? Title Will be able to ambulate at least 571f with no device, reciprocal gait pattern and equal step lengths/stance times with quality heel toe pattern   ? Status On-going   ?  ? PT SHORT TERM GOAL #4  ? Title Will be independent with edema management program   ? Status Achieved   ? ?  ?  ? ?  ? ? ? ? PT Long Term Goals - 10/27/21 1701   ? ?  ? PT LONG TERM GOAL #1  ? Title MMT to improve by at least 1 grade in all weak groups   ? Status On-going   ?  ? PT LONG TERM GOAL #2  ? Title Will be able to reciprocally ascend/descend at least 4 steps without increase in pain and U UE support   ? Status On-going   ?  ? PT LONG TERM GOAL #3  ? Title Will be able to ambulate over uneven surfaces such as grass or gravel with LRAD and no unsteadiness to show improved functional  balance   ? Status On-going   ? ?  ?  ? ?  ? ? ? ? ? ? ? ? Plan - 10/27/21 1659   ? ? Clinical Impression Statement Patient did better walking today, no SPC, less antalgic gait.  He lacks about 12 degrees of active extension, passively I can take him easily to full TKE, very weak and needs assist to get the motion.  He does have instances of buckling and this gets in his head and he startes to really protect himself   ? PT Next Visit Plan work on TKE and proprioception   ? Consulted and Agree with Plan of Care Patient   ? ?  ?  ? ?  ? ? ?Patient will benefit from skilled therapeutic intervention in order to improve the following deficits and impairments:  Abnormal gait, Decreased range of motion, Difficulty walking, Increased muscle spasms, Decreased activity tolerance, Decreased skin integrity, Pain, Decreased scar mobility, Hypomobility, Impaired flexibility, Decreased strength, Decreased mobility, Increased edema ? ?Visit Diagnosis: ?Acute pain of right knee ? ?Localized muscle weakness ? ?Difficulty in walking, not elsewhere classified ? ?Stiffness of right knee, not elsewhere classified ? ? ? ? ?Problem List ?Patient Active Problem List  ? Diagnosis Date Noted  ? Morbid obesity, associated with hypertension, hyperlipidemia, prediabetes, and osteoarthritis ([place] 09/15/2021  ? Status post total right knee replacement 09/07/2021  ? Primary osteoarthritis of right knee 12/30/2020  ? Meniscus, medial, derangement, right 12/30/2020  ? BMI 37.0-37.9, adult 12/10/2020  ? Obstructive sleep apnea syndrome, severe 11/29/2020  ? Hyperlipidemia 09/06/2020  ? Resistant hypertension 09/06/2020  ? Pre-diabetes 09/06/2020  ? 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 Bowel obstruction ([place] 06/11/2019  ? Rheumatoid arthritis involving multiple sites with positive rheumatoid factor ([place] 10/13/2015  ? ? ?Sumner Boast PT ?10/27/2021, 5:02 PM ? ?[place] [address] ?[place] [address]?Phone: [phone]  Fax:  [phone]? ?Name: JHilmar Moldovan?MRN: [id number]?Date of Birth: 108-18-1972? ? ? ?

## 2021-10-28 ENCOUNTER — Encounter: Payer: Self-pay | Admitting: Hematology

## 2021-10-31 ENCOUNTER — Encounter: Payer: Self-pay | Admitting: Physical Therapy

## 2021-10-31 ENCOUNTER — Ambulatory Visit: Payer: 59 | Admitting: Physical Therapy

## 2021-10-31 DIAGNOSIS — M25561 Pain in right knee: Secondary | ICD-10-CM | POA: Diagnosis not present

## 2021-10-31 DIAGNOSIS — M6281 Muscle weakness (generalized): Secondary | ICD-10-CM

## 2021-10-31 DIAGNOSIS — M25661 Stiffness of right knee, not elsewhere classified: Secondary | ICD-10-CM

## 2021-10-31 DIAGNOSIS — R262 Difficulty in walking, not elsewhere classified: Secondary | ICD-10-CM

## 2021-10-31 NOTE — Therapy (Signed)
[facility] [address] ?[place], [name], [address] ?Phone: [phone]   Fax:  [phone] ? ?Physical Therapy Treatment ?Progress Note ?Reporting Period 09/29/21 to 10/31/21 ? ?See note below for Objective Data and Assessment of Progress/Goals.  ? ?  ?Patient Details  ?Name: Dwayne Sawyer ?MRN: [id number] ?Date of Birth: 1971-05-04 ?Referring Provider (PT): Dwana Melena ? ? ?Encounter Date: 10/31/2021 ? ? PT End of Session - 10/31/21 1309   ? ? Visit Number 10   ? Date for PT Re-Evaluation 11/10/21   ? Authorization Type [employer]   ? PT Start Time 1232   ? PT Stop Time 1313   ? PT Time Calculation (min) 41 min   ? Activity Tolerance Patient tolerated treatment well   ? Behavior During Therapy [facility] for tasks assessed/performed   ? ?  ?  ? ?  ? ? ?Past Medical History:  ?Diagnosis Date  ? Arthritis   ? Cancer [facility]) 2020  ? liver  ? Diabetes mellitus without complication ([place])   ? type 2- pt was told he was pre-diabetic  ? Family history of carcinoid tumor   ? High cholesterol   ? Hypertension   ? ? ?Past Surgical History:  ?Procedure Laterality Date  ? EYE SURGERY Right   ? pt says we he was a baby surgery had to be done on one eye "because it wasn't as strong as the other"  ? HERNIA REPAIR  1988  ? inguinal- pt doesn't remember which side  ? LAPAROTOMY N/A 06/12/2019  ? Procedure: EXPLORATORY LAPAROTOMY WITH SMALL BOWEL RESECTION;  Surgeon: Johnathan Hausen, MD;  [facility];  Service: General;  Laterality: N/A;  ? SHOULDER SURGERY Right 2001  ? rotator cuff repair  ? SMALL INTESTINE SURGERY  06/12/2019  ? part of ex lap procedure. pt states "3 feet of intestine was removed"  ? TONSILLECTOMY    ? as a child  ? TOTAL KNEE ARTHROPLASTY Right 09/07/2021  ? Procedure: RIGHT TOTAL KNEE ARTHROPLASTY;  Surgeon: Leandrew Koyanagi, MD;  Location: [place];  Service: Orthopedics;  Laterality: Right;  ? ? ?There were no vitals filed for this visit. ? ? Subjective Assessment -  10/31/21 1230   ? ? Subjective Patient reports things are going pretty well. they feel like they need to continue to progress overall.   ? Currently in Pain? No/denies   ? ?  ?  ? ?  ? ? ? ? ? [facility] PT Assessment - 10/31/21 0001   ? ?  ? AROM  ? Right Knee Extension 7   ? Right Knee Flexion 101   ? ?  ?  ? ?  ? ? ? ? ? ? ? ? ? ? ? ? ? ? ? ? [facility] Adult PT Treatment/Exercise - 10/31/21 0001   ? ?  ? Knee/Hip Exercises: Machines for Strengthening  ? Cybex Knee Extension 5# 2x10 both legs, started right Leg eccentrics 2 x 5   ? Cybex Knee Flexion 25# 2x10 both legs   ?  ? Knee/Hip Exercises: Standing  ? Forward Step Up Right;1 set;15 reps   ? Forward Step Up Limitations 4" step   ? Other Standing Knee Exercises B side step against green tband resistance placed above knees. 3 x 10 reps each direction.,   ? ?  ?  ? ?  ? ? ? ? ? ? ? ? ? ? ? ? PT Short Term Goals - 10/31/21  1238   ? ?  ? PT SHORT TERM GOAL #1  ? Title Will be compliant with appropriate progressive HEP   ? Status Achieved   ?  ? PT SHORT TERM GOAL #2  ? Title R knee AROM to be no more than 5 degrees extension and no less than 105 degrees flexion   ? Baseline 7-101   ? Time 1   ? Status On-going   ?  ? PT SHORT TERM GOAL #3  ? Title Will be able to ambulate at least 529f with no device, reciprocal gait pattern and equal step lengths/stance times with quality heel toe pattern   ? Status On-going   ?  ? PT SHORT TERM GOAL #4  ? Title Will be independent with edema management program   ? Status Achieved   ? ?  ?  ? ?  ? ? ? ? PT Long Term Goals - 10/31/21 1246   ? ?  ? PT LONG TERM GOAL #1  ? Title MMT to improve by at least 1 grade in all weak groups   ? Status On-going   ?  ? PT LONG TERM GOAL #2  ? Title Will be able to reciprocally ascend/descend at least 4 steps without increase in pain and U UE support   ? Time 2   ? Period Weeks   ? Status On-going   ?  ? PT LONG TERM GOAL #3  ? Title Will be able to ambulate over uneven surfaces such as grass or gravel  with LRAD and no unsteadiness to show improved functional balance   ? Time 2   ? Status On-going   ?  ? PT LONG TERM GOAL #4  ? Title Pain in R knee to be no more than 2/10 with all functional weight bearing activities at work and at home   ? Time 2   ? Status On-going   ? ?  ?  ? ?  ? ? ? ? ? ? ? ? Plan - 10/31/21 1307   ? ? Clinical Impression Statement Patient reports no problems. Functional status re-asssessed for PN. He is progressing in all areas toward LTG. continued with strengthening to isolated knee muscles as well as functional strengthening and ROM.   ? Personal Factors and Comorbidities Comorbidity 3+;Finances   ? Examination-Activity Limitations Locomotion Level;Transfers;Sit;Sleep;Squat;Stairs;Stand;Lift   ? Examination-Participation Restrictions Cleaning;Occupation;Community Activity;Shop;Laundry;YValla LeaverWork   ? Stability/Clinical Decision Making Stable/Uncomplicated   ? Clinical Decision Making Low   ? Rehab Potential Good   ? PT Frequency Other (comment)   ? PT Duration Other (comment)   5w  ? PT Treatment/Interventions ADLs/Self Care Home Management;Cryotherapy;Electrical Stimulation;Iontophoresis '4mg'$ /ml Dexamethasone;Moist Heat;Ultrasound;DME Instruction;Gait training;Stair training;Functional mobility training;Therapeutic activities;Therapeutic exercise;Balance training;Neuromuscular re-education;Patient/family education;Manual techniques;Scar mobilization;Passive range of motion;Dry needling;Energy conservation;Taping;Vasopneumatic Device   ? PT Home Exercise Plan [email or web address]   ? Consulted and Agree with Plan of Care Patient   ? ?  ?  ? ?  ? ? ?Patient will benefit from skilled therapeutic intervention in order to improve the following deficits and impairments:  Abnormal gait, Decreased range of motion, Difficulty walking, Increased muscle spasms, Decreased activity tolerance, Decreased skin integrity, Pain, Decreased scar mobility, Hypomobility, Impaired flexibility, Decreased strength, Decreased  mobility, Increased edema ? ?Visit Diagnosis: ?Acute pain of right knee ? ?Localized muscle weakness ? ?Difficulty in walking, not elsewhere classified ? ?Stiffness of right knee, not elsewhere classified ? ? ? ? ?Problem List ?Patient Active Problem List  ? Diagnosis Date  Noted  ? Morbid obesity, associated with hypertension, hyperlipidemia, prediabetes, and osteoarthritis ([place]) 09/15/2021  ? Status post total right knee replacement 09/07/2021  ? Primary osteoarthritis of right knee 12/30/2020  ? Meniscus, medial, derangement, right 12/30/2020  ? BMI 37.0-37.9, adult 12/10/2020  ? Obstructive sleep apnea syndrome, severe 11/29/2020  ? Hyperlipidemia 09/06/2020  ? Resistant hypertension 09/06/2020  ? Pre-diabetes 09/06/2020  ? 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 Bowel obstruction ([place]) 06/11/2019  ? Rheumatoid arthritis involving multiple sites with positive rheumatoid factor ([place]) 10/13/2015  ? ? ?Marcelina Morel, DPT ?10/31/2021, 1:25 PM ? ?[place] ?[address] ?[place], [name], [address] ?Phone: [phone]   Fax:  [phone] ? ?Name: Dwayne Sawyer ?MRN: [id number] ?Date of Birth: [birth year] ? ? ? ?

## 2021-11-03 ENCOUNTER — Ambulatory Visit: Payer: 59 | Admitting: Physical Therapy

## 2021-11-03 ENCOUNTER — Encounter: Payer: Self-pay | Admitting: Physical Therapy

## 2021-11-03 DIAGNOSIS — M6281 Muscle weakness (generalized): Secondary | ICD-10-CM

## 2021-11-03 DIAGNOSIS — M25561 Pain in right knee: Secondary | ICD-10-CM

## 2021-11-03 DIAGNOSIS — R262 Difficulty in walking, not elsewhere classified: Secondary | ICD-10-CM

## 2021-11-03 DIAGNOSIS — M25661 Stiffness of right knee, not elsewhere classified: Secondary | ICD-10-CM

## 2021-11-03 NOTE — Therapy (Signed)
[facility] ?[address] ?[address] ?[place], [name], [address] ?Phone: [phone]   Fax:  [phone] ? ?Physical Therapy Treatment ? ?Patient Details  ?Name: Dwayne Sawyer ?MRN: [id number] ?Date of Birth: 1970/09/13 ?Referring Provider (PT): Dwana Melena ? ? ?Encounter Date: 11/03/2021 ? ? PT End of Session - 11/03/21 0924   ? ? Visit Number 11   ? PT Start Time [phone]   ? PT Stop Time 0926   ? PT Time Calculation (min) 40 min   ? Activity Tolerance Patient tolerated treatment well   ? Behavior During Therapy [facility] for tasks assessed/performed   ? ?  ?  ? ?  ? ? ?Past Medical History:  ?Diagnosis Date  ? Arthritis   ? Cancer [facility]) 2020  ? liver  ? Diabetes mellitus without complication ([place])   ? type 2- pt was told he was pre-diabetic  ? Family history of carcinoid tumor   ? High cholesterol   ? Hypertension   ? ? ?Past Surgical History:  ?Procedure Laterality Date  ? EYE SURGERY Right   ? pt says we he was a baby surgery had to be done on one eye "because it wasn't as strong as the other"  ? HERNIA REPAIR  1988  ? inguinal- pt doesn't remember which side  ? LAPAROTOMY N/A 06/12/2019  ? Procedure: EXPLORATORY LAPAROTOMY WITH SMALL BOWEL RESECTION;  Surgeon: Johnathan Hausen, MD;  [facility];  Service: General;  Laterality: N/A;  ? SHOULDER SURGERY Right 2001  ? rotator cuff repair  ? SMALL INTESTINE SURGERY  06/12/2019  ? part of ex lap procedure. pt states "3 feet of intestine was removed"  ? TONSILLECTOMY    ? as a child  ? TOTAL KNEE ARTHROPLASTY Right 09/07/2021  ? Procedure: RIGHT TOTAL KNEE ARTHROPLASTY;  Surgeon: Leandrew Koyanagi, MD;  Location: [facility];  Service: Orthopedics;  Laterality: Right;  ? ? ?There were no vitals filed for this visit. ? ? Subjective Assessment - 11/03/21 0846   ? ? Subjective Patient reports that his knee gets severely sore after stretching, sometimes keeping him awake at night.   ? Currently in Pain? No/denies   ? ?  ?  ? ?   ? ? ? ? ? ? ? ? ? ? ? ? ? ? ? ? ? ? ? ? [facility] Adult PT Treatment/Exercise - 11/03/21 0001   ? ?  ? Knee/Hip Exercises: Stretches  ? ITB Stretch Left;1 rep;60 seconds   ? Other Knee/Hip Stretches Knee to opposite shoulder to stretch outer hip x 60 seconds   ?  ? Knee/Hip Exercises: Aerobic  ? Nustep L5 x 6 minutes   ?  ? Knee/Hip Exercises: Standing  ? Lateral Step Up Right;1 set;10 reps   ? Lateral Step Up Limitations 4"   ? Forward Step Up Right;1 set;10 reps   ? Forward Step Up Limitations 4" step   ? Other Standing Knee Exercises crossover step up on 4" step x 10 reps, RLe.   ? Other Standing Knee Exercises alternate box tap onto 8" box, 2 x 10 reps   ?  ? Knee/Hip Exercises: Supine  ? Bridges Strengthening;Both;1 set;10 reps   on physioball  ? Straight Leg Raise with External Rotation Strengthening;Left;10 reps;3 sets   3#  ?  ? Manual Therapy  ? Manual Therapy Soft tissue mobilization;Myofascial release   ? Soft tissue mobilization ITB   ? Myofascial Release ITB   ? ?  ?  ? ?  ? ? ? ? ? ? ? ? ? ? ? ?  PT Short Term Goals - 10/31/21 1238   ? ?  ? PT SHORT TERM GOAL #1  ? Title Will be compliant with appropriate progressive HEP   ? Status Achieved   ?  ? PT SHORT TERM GOAL #2  ? Title R knee AROM to be no more than 5 degrees extension and no less than 105 degrees flexion   ? Baseline 7-101   ? Time 1   ? Status On-going   ?  ? PT SHORT TERM GOAL #3  ? Title Will be able to ambulate at least 530f with no device, reciprocal gait pattern and equal step lengths/stance times with quality heel toe pattern   ? Status On-going   ?  ? PT SHORT TERM GOAL #4  ? Title Will be independent with edema management program   ? Status Achieved   ? ?  ?  ? ?  ? ? ? ? PT Long Term Goals - 10/31/21 1246   ? ?  ? PT LONG TERM GOAL #1  ? Title MMT to improve by at least 1 grade in all weak groups   ? Status On-going   ?  ? PT LONG TERM GOAL #2  ? Title Will be able to reciprocally ascend/descend at least 4 steps without increase in pain  and U UE support   ? Time 2   ? Period Weeks   ? Status On-going   ?  ? PT LONG TERM GOAL #3  ? Title Will be able to ambulate over uneven surfaces such as grass or gravel with LRAD and no unsteadiness to show improved functional balance   ? Time 2   ? Status On-going   ?  ? PT LONG TERM GOAL #4  ? Title Pain in R knee to be no more than 2/10 with all functional weight bearing activities at work and at home   ? Time 2   ? Status On-going   ? ?  ?  ? ?  ? ? ? ? ? ? ? ? Plan - 11/03/21 0851   ? ? Clinical Impression Statement Patient reports that his knee is very painful after stretching, keeping him awake some nights. Assessed ITB, which is definitely tight and replicates his pain. Performed STM and stretch for ITB F/B strengthening to knee and ROM.   ? Examination-Activity Limitations Locomotion Level;Transfers;Sit;Sleep;Squat;Stairs;Stand;Lift   ? Examination-Participation Restrictions Cleaning;Occupation;Community Activity;Shop;Laundry;[employer]alla LeaverWork   ? Stability/Clinical Decision Making Stable/Uncomplicated   ? Clinical Decision Making Low   ? Rehab Potential Good   ? PT Frequency Other (comment)   ? PT Duration Other (comment)   5w  ? PT Treatment/Interventions ADLs/Self Care Home Management;Cryotherapy;Electrical Stimulation;Iontophoresis '4mg'$ /ml Dexamethasone;Moist Heat;Ultrasound;DME Instruction;Gait training;Stair training;Functional mobility training;Therapeutic activities;Therapeutic exercise;Balance training;Neuromuscular re-education;Patient/family education;Manual techniques;Scar mobilization;Passive range of motion;Dry needling;Energy conservation;Taping;Vasopneumatic Device   ? PT Next Visit Plan Strength, ROM, How is ITB   ? PT Home Exercise Plan [email or web address]   ? Consulted and Agree with Plan of Care Patient   ? ?  ?  ? ?  ? ? ?Patient will benefit from skilled therapeutic intervention in order to improve the following deficits and impairments:  Abnormal gait, Decreased range of motion, Difficulty walking,  Increased muscle spasms, Decreased activity tolerance, Decreased skin integrity, Pain, Decreased scar mobility, Hypomobility, Impaired flexibility, Decreased strength, Decreased mobility, Increased edema ? ?Visit Diagnosis: ?Acute pain of right knee ? ?Difficulty in walking, not elsewhere classified ? ?Stiffness of right knee, not elsewhere classified ? ?Localized muscle  weakness ? ? ? ? ?Problem List ?Patient Active Problem List  ? Diagnosis Date Noted  ? Morbid obesity, associated with hypertension, hyperlipidemia, prediabetes, and osteoarthritis ([place]) 09/15/2021  ? Status post total right knee replacement 09/07/2021  ? Primary osteoarthritis of right knee 12/30/2020  ? Meniscus, medial, derangement, right 12/30/2020  ? BMI 37.0-37.9, adult 12/10/2020  ? Obstructive sleep apnea syndrome, severe 11/29/2020  ? Hyperlipidemia 09/06/2020  ? Resistant hypertension 09/06/2020  ? Pre-diabetes 09/06/2020  ? 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 Bowel obstruction ([place]) 06/11/2019  ? Rheumatoid arthritis involving multiple sites with positive rheumatoid factor ([place]) 10/13/2015  ? ? ?Marcelina Morel, DPT ?11/03/2021, 9:30 AM ? ?[place] ?[place] ?[address] ?[place], [name], [address] ?Phone: [phone]   Fax:  [phone] ? ?Name: Dwayne Sawyer ?MRN: [id number] ?Date of Birth: 01-10-1971 ? ? ? ?

## 2021-11-07 ENCOUNTER — Ambulatory Visit: Payer: 59 | Admitting: Physical Therapy

## 2021-11-07 ENCOUNTER — Encounter: Payer: Self-pay | Admitting: Physical Therapy

## 2021-11-07 DIAGNOSIS — R262 Difficulty in walking, not elsewhere classified: Secondary | ICD-10-CM

## 2021-11-07 DIAGNOSIS — M25561 Pain in right knee: Secondary | ICD-10-CM

## 2021-11-07 DIAGNOSIS — M25661 Stiffness of right knee, not elsewhere classified: Secondary | ICD-10-CM

## 2021-11-07 DIAGNOSIS — M6281 Muscle weakness (generalized): Secondary | ICD-10-CM

## 2021-11-07 NOTE — Therapy (Signed)
[facility] ?[place] ?[address] ?[place], [name], [address] ?Phone: [phone]   Fax:  [phone] ? ?Physical Therapy Treatment ? ?Patient Details  ?Name: Dwayne Sawyer ?MRN: [id number] ?Date of Birth: 1971-07-14 ?Referring Provider (PT): Dwana Melena ? ? ?Encounter Date: 11/07/2021 ? ? PT End of Session - 11/07/21 1048   ? ? Visit Number 12   ? Number of Visits 20   ? Date for PT Re-Evaluation 01/10/22   ? Authorization Type [facility]   ? Authorization Time Period 09/29/21 to 11/10/21; extended to 01/05/22   ? Authorization - Number of Visits 30   ? PT Start Time 1017   ? PT Stop Time 1056   ? PT Time Calculation (min) 39 min   ? Activity Tolerance Patient tolerated treatment well   ? Behavior During Therapy [facility] for tasks assessed/performed   ? ?  ?  ? ?  ? ? ?Past Medical History:  ?Diagnosis Date  ? Arthritis   ? Cancer [facility]) 2020  ? liver  ? Diabetes mellitus without complication ([place])   ? type 2- pt was told he was pre-diabetic  ? Family history of carcinoid tumor   ? High cholesterol   ? Hypertension   ? ? ?Past Surgical History:  ?Procedure Laterality Date  ? EYE SURGERY Right   ? pt says we he was a baby surgery had to be done on one eye "because it wasn't as strong as the other"  ? HERNIA REPAIR  1988  ? inguinal- pt doesn't remember which side  ? LAPAROTOMY N/A 06/12/2019  ? Procedure: EXPLORATORY LAPAROTOMY WITH SMALL BOWEL RESECTION;  Surgeon: Johnathan Hausen, MD;  [facility];  Service: General;  Laterality: N/A;  ? SHOULDER SURGERY Right 2001  ? rotator cuff repair  ? SMALL INTESTINE SURGERY  06/12/2019  ? part of ex lap procedure. pt states "3 feet of intestine was removed"  ? TONSILLECTOMY    ? as a child  ? TOTAL KNEE ARTHROPLASTY Right 09/07/2021  ? Procedure: RIGHT TOTAL KNEE ARTHROPLASTY;  Surgeon: Leandrew Koyanagi, MD;  Location: [facility];  Service: Orthopedics;  Laterality: Right;  ? ? ?There were no vitals filed for this visit. ? ? Subjective  Assessment - 11/07/21 1024   ? ? Subjective I'm doing OK today, [facility] because of my honey-do list. Knee is OK just stiff. The biggest thing that is hard for me is going foot over foot on steps. I don't know if its a strength thing or if I'm still just in my head about it.   ? Patient Stated Goals work on mobility, improve strength   ? Currently in Pain? No/denies   ? ?  ?  ? ?  ? ? ? ? ? [facility] PT Assessment - 11/07/21 0001   ? ?  ? Assessment  ? Medical Diagnosis s/p R TKR   ? Referring Provider (PT) Dwana Melena   ? Onset Date/Surgical Date 09/07/21   ? Next MD Visit surgeon's office May 9th   ? Prior Therapy acute and HHPT for this knee   ?  ? Precautions  ? Precautions None   ? Precaution Comments WBAT   ?  ? Restrictions  ? Weight Bearing Restrictions No   ?  ? Balance Screen  ? Has the patient fallen in the past 6 months No   ? Has the patient had a decrease in activity level because  of a fear of falling?  No   ? Is the patient reluctant to leave their home because of a fear of falling?  No   ?  ? Home Environment  ? Living Environment Private residence   ?  ? Prior Function  ? Level of Independence Independent;Independent with basic ADLs   ? Vocation Full time employment   ? Vocation Requirements QC tech   ? Leisure golf   ?  ? AROM  ? Right Knee Extension 3   ? Right Knee Flexion 97   ?  ? Strength  ? Right Hip Flexion 5/5   ? Right Hip ABduction 4/5   ? Left Hip Flexion 5/5   ? Left Hip ABduction 4/5   ? Right Knee Flexion 4/5   ? Right Knee Extension 5/5   ? Left Knee Flexion 4/5   ? Left Knee Extension 5/5   ? Right Ankle Dorsiflexion 5/5   ? Left Ankle Dorsiflexion 5/5   ? ?  ?  ? ?  ? ? ? ? ? ? ? ? ? ? ? ? ? ? ? ? [facility] Adult PT Treatment/Exercise - 11/07/21 0001   ? ?  ? Knee/Hip Exercises: Standing  ? Lateral Step Up Both;1 set;15 reps;Hand Hold: 1;Step Height: 4"   ? Forward Step Up Both;1 set;15 reps;Hand Hold: 1;Step Height: 4"   ? Step Down Both;1 set;10 reps;Hand  Hold: 2;Step Height: 4"   ? ?  ?  ? ?  ? ? ? ? ? ? ? ? ? ? PT Education - 11/07/21 1048   ? ? Education Details reassess findings, POC moving forward   ? Person(s) Educated Patient   ? Methods Explanation   ? Comprehension Verbalized understanding   ? ?  ?  ? ?  ? ? ? PT Short Term Goals - 11/07/21 1034   ? ?  ? PT SHORT TERM GOAL #1  ? Title Will be compliant with appropriate progressive HEP   ? Baseline 4/17- compliant and has been going to the gym   ? Time 3   ? Period Weeks   ? Status Achieved   ?  ? PT SHORT TERM GOAL #2  ? Title R knee AROM to be no more than 5 degrees extension and no less than 105 degrees flexion   ? Baseline 3-97   ? Time 1   ? Period Weeks   ? Status Partially Met   ?  ? PT SHORT TERM GOAL #3  ? Title Will be able to ambulate at least 569f with no device, reciprocal gait pattern and equal step lengths/stance times with quality heel toe pattern   ? Baseline 4/17- no issues but still with mild gait impairment   ? Time 3   ? Period Weeks   ? Status Partially Met   ?  ? PT SHORT TERM GOAL #4  ? Title Will be independent with edema management program   ? Baseline 4/17- compliant   ? Time 3   ? Period Weeks   ? Status Achieved   ? ?  ?  ? ?  ? ? ? ? PT Long Term Goals - 11/07/21 1036   ? ?  ? PT LONG TERM GOAL #1  ? Title MMT to improve by at least 1 grade in all weak groups   ? Baseline 4/17- coming along nicely   ? Time 6   ? Period Weeks   ? Status Partially Met   ?  ?  PT LONG TERM GOAL #2  ? Title Will be able to reciprocally ascend/descend at least 4 steps without increase in pain and U UE support   ? Baseline 4/17- moderate difficulty   ? Time 2   ? Period Weeks   ? Status On-going   ?  ? PT LONG TERM GOAL #3  ? Title Will be able to ambulate over uneven surfaces such as grass or gravel with LRAD and no unsteadiness to show improved functional balance   ? Baseline 4/17- coming along but not perfect   ? Time 2   ? Period Weeks   ? Status On-going   ?  ? PT LONG TERM GOAL #4  ? Title Pain  in R knee to be no more than 2/10 with all functional weight bearing activities at work and at home   ? Baseline 4/17- no pain   ? Time 2   ? Period Weeks   ? Status Achieved   ? ?  ?  ? ?  ? ? ? ? ? ? ? ? Plan - 11/07/21 1050   ? ? Clinical Impression Statement Reznor arrives today doing well, we did a reassessment and found biggest remaining issues are strength and stair navigation. Will plan to continue on a little longer so we can help him to focus on these areas and optimize level of function moving forward prior to DC, however did adjust PT schedule to help account for high co-pay.   ? Personal Factors and Comorbidities Comorbidity 3+;Finances   ? Examination-Activity Limitations Locomotion Level;Transfers;Sit;Sleep;Squat;Stairs;Stand;Lift   ? Examination-Participation Restrictions Cleaning;Occupation;Community Activity;Shop;Laundry;[employer] Work   ? Stability/Clinical Decision Making Stable/Uncomplicated   ? Clinical Decision Making Low   ? Rehab Potential Good   ? PT Frequency Other (comment)   10 more visits during indicated dates  ? PT Duration Other (comment)   through 01/05/22  ? PT Treatment/Interventions ADLs/Self Care Home Management;Cryotherapy;Electrical Stimulation;Iontophoresis 24m/ml Dexamethasone;Moist Heat;Ultrasound;DME Instruction;Gait training;Stair training;Functional mobility training;Therapeutic activities;Therapeutic exercise;Balance training;Neuromuscular re-education;Patient/family education;Manual techniques;Scar mobilization;Passive range of motion;Dry needling;Energy conservation;Taping;Vasopneumatic Device   ? PT Next Visit Plan focus on strength, ROM, stairs   ? PT Home Exercise Plan [email or web address]   ? Consulted and Agree with Plan of Care Patient   ? ?  ?  ? ?  ? ? ?Patient will benefit from skilled therapeutic intervention in order to improve the following deficits and impairments:  Abnormal gait, Decreased range of motion, Difficulty walking, Increased muscle spasms, Decreased activity  tolerance, Decreased skin integrity, Pain, Decreased scar mobility, Hypomobility, Impaired flexibility, Decreased strength, Decreased mobility, Increased edema ? ?Visit Diagnosis: ?Acute pain of right knee

## 2021-11-10 ENCOUNTER — Ambulatory Visit: Payer: 59 | Admitting: Physical Therapy

## 2021-11-10 ENCOUNTER — Encounter: Payer: Self-pay | Admitting: Physical Therapy

## 2021-11-10 DIAGNOSIS — R262 Difficulty in walking, not elsewhere classified: Secondary | ICD-10-CM

## 2021-11-10 DIAGNOSIS — M25561 Pain in right knee: Secondary | ICD-10-CM

## 2021-11-10 DIAGNOSIS — M25661 Stiffness of right knee, not elsewhere classified: Secondary | ICD-10-CM

## 2021-11-10 DIAGNOSIS — M6281 Muscle weakness (generalized): Secondary | ICD-10-CM

## 2021-11-10 NOTE — Therapy (Signed)
[facility] ?[place] ?[address] ?[place], [name], [address] ?Phone: [phone]   Fax:  [phone] ? ?Physical Therapy Treatment ? ?Patient Details  ?Name: Dwayne Sawyer ?MRN: [id number] ?Date of Birth: Apr 20, 1971 ?Referring Provider (PT): Dwana Melena ? ? ?Encounter Date: 11/10/2021 ? ? PT End of Session - 11/10/21 0929   ? ? Visit Number 13   ? Number of Visits 20   ? Date for PT Re-Evaluation 01/10/22   ? Authorization Type [facility]   ? Authorization Time Period 09/29/21 to 11/10/21; extended to 01/05/22   ? Authorization - Number of Visits 30   ? PT Start Time [phone]   ? PT Stop Time [phone]   ? PT Time Calculation (min) 39 min   ? Activity Tolerance Patient tolerated treatment well   ? Behavior During Therapy [facility] for tasks assessed/performed   ? ?  ?  ? ?  ? ? ?Past Medical History:  ?Diagnosis Date  ? Arthritis   ? Cancer [facility]) 2020  ? liver  ? Diabetes mellitus without complication ([place])   ? type 2- pt was told he was pre-diabetic  ? Family history of carcinoid tumor   ? High cholesterol   ? Hypertension   ? ? ?Past Surgical History:  ?Procedure Laterality Date  ? EYE SURGERY Right   ? pt says we he was a baby surgery had to be done on one eye "because it wasn't as strong as the other"  ? HERNIA REPAIR  1988  ? inguinal- pt doesn't remember which side  ? LAPAROTOMY N/A 06/12/2019  ? Procedure: EXPLORATORY LAPAROTOMY WITH SMALL BOWEL RESECTION;  Surgeon: Johnathan Hausen, MD;  [facility];  Service: General;  Laterality: N/A;  ? SHOULDER SURGERY Right 2001  ? rotator cuff repair  ? SMALL INTESTINE SURGERY  06/12/2019  ? part of ex lap procedure. pt states "3 feet of intestine was removed"  ? TONSILLECTOMY    ? as a child  ? TOTAL KNEE ARTHROPLASTY Right 09/07/2021  ? Procedure: RIGHT TOTAL KNEE ARTHROPLASTY;  Surgeon: Leandrew Koyanagi, MD;  Location: [facility];  Service: Orthopedics;  Laterality: Right;  ? ? ?There were no vitals filed for this visit. ? ? Subjective  Assessment - 11/10/21 0853   ? ? Subjective My knee is really just stiff, I need to figure out a way to get it moving more at home   ? Patient Stated Goals work on mobility, improve strength   ? Currently in Pain? No/denies   ? ?  ?  ? ?  ? ? ? ? ? ? ? ? ? ? ? ? ? ? ? ? ? ? ? ? [place] Adult PT Treatment/Exercise - 11/10/21 0001   ? ?  ? Knee/Hip Exercises: Stretches  ? Active Hamstring Stretch Right;2 reps;30 seconds   ? Other Knee/Hip Stretches knee flexion stretch 10x5 seocnd holds   ?  ? Knee/Hip Exercises: Aerobic  ? Recumbent Bike x 6 minutes seat position 11 -->9   ?  ? Knee/Hip Exercises: Standing  ? Heel Raises Both;1 set;10 reps   ? Heel Raises Limitations U heel raises   ? Forward Step Up Both;1 set;10 reps;Step Height: 6";Hand Hold: 2   ? Forward Step Up Limitations with step up onto next stair with contralateral foot   ? Step Down Both;1 set;Step Height: 6";15 reps;Hand Hold: 2   ? Step Down Limitations 4 inch step   ? Wall Squat 1 set;15  reps   ? Wall Squat Limitations with wt shift over R LE   ? Other Standing Knee Exercises mini squat holds with side steps 4x79f   ? Other Standing Knee Exercises hip hikes 1x10 B   ?  ? Manual Therapy  ? Manual Therapy Soft tissue mobilization   ? Soft tissue mobilization lateral R thigh   ? ?  ?  ? ?  ? ? ? ? ? ? ? ? ? ? PT Education - 11/10/21 0928   ? ? Education Details POC next session   ? Person(s) Educated Patient   ? Methods Explanation   ? Comprehension Verbalized understanding   ? ?  ?  ? ?  ? ? ? PT Short Term Goals - 11/07/21 1034   ? ?  ? PT SHORT TERM GOAL #1  ? Title Will be compliant with appropriate progressive HEP   ? Baseline 4/17- compliant and has been going to the gym   ? Time 3   ? Period Weeks   ? Status Achieved   ?  ? PT SHORT TERM GOAL #2  ? Title R knee AROM to be no more than 5 degrees extension and no less than 105 degrees flexion   ? Baseline 3-97   ? Time 1   ? Period Weeks   ? Status Partially Met   ?  ? PT SHORT TERM GOAL #3  ? Title  Will be able to ambulate at least 5075fwith no device, reciprocal gait pattern and equal step lengths/stance times with quality heel toe pattern   ? Baseline 4/17- no issues but still with mild gait impairment   ? Time 3   ? Period Weeks   ? Status Partially Met   ?  ? PT SHORT TERM GOAL #4  ? Title Will be independent with edema management program   ? Baseline 4/17- compliant   ? Time 3   ? Period Weeks   ? Status Achieved   ? ?  ?  ? ?  ? ? ? ? PT Long Term Goals - 11/07/21 1036   ? ?  ? PT LONG TERM GOAL #1  ? Title MMT to improve by at least 1 grade in all weak groups   ? Baseline 4/17- coming along nicely   ? Time 6   ? Period Weeks   ? Status Partially Met   ?  ? PT LONG TERM GOAL #2  ? Title Will be able to reciprocally ascend/descend at least 4 steps without increase in pain and U UE support   ? Baseline 4/17- moderate difficulty   ? Time 2   ? Period Weeks   ? Status On-going   ?  ? PT LONG TERM GOAL #3  ? Title Will be able to ambulate over uneven surfaces such as grass or gravel with LRAD and no unsteadiness to show improved functional balance   ? Baseline 4/17- coming along but not perfect   ? Time 2   ? Period Weeks   ? Status On-going   ?  ? PT LONG TERM GOAL #4  ? Title Pain in R knee to be no more than 2/10 with all functional weight bearing activities at work and at home   ? Baseline 4/17- no pain   ? Time 2   ? Period Weeks   ? Status Achieved   ? ?  ?  ? ?  ? ? ? ? ? ? ? ?  Plan - 11/10/21 0929   ? ? Clinical Impression Statement Annie arrives today doing well, knee is just staying stiff. We did discuss getting a home floor bike to help him with ROM in the mornings until he can get to the gym. Otherwise continued general focus on strength, ROM and balance. Will continue to progress as able and tolerated.   ? Personal Factors and Comorbidities Comorbidity 3+;Finances   ? Examination-Activity Limitations Locomotion Level;Transfers;Sit;Sleep;Squat;Stairs;Stand;Lift   ? Examination-Participation  Restrictions Cleaning;Occupation;Community Activity;Shop;Laundry;[employer] Work   ? Stability/Clinical Decision Making Stable/Uncomplicated   ? Clinical Decision Making Low   ? Rehab Potential Good   ? PT Frequency Other (comment)   ? PT Duration Other (comment)   ? PT Treatment/Interventions ADLs/Self Care Home Management;Cryotherapy;Electrical Stimulation;Iontophoresis 80m/ml Dexamethasone;Moist Heat;Ultrasound;DME Instruction;Gait training;Stair training;Functional mobility training;Therapeutic activities;Therapeutic exercise;Balance training;Neuromuscular re-education;Patient/family education;Manual techniques;Scar mobilization;Passive range of motion;Dry needling;Energy conservation;Taping;Vasopneumatic Device   ? PT Next Visit Plan focus on strength, ROM, stairs, fine tune HEP   ? PT Home Exercise Plan [email or web address]   ? Consulted and Agree with Plan of Care Patient   ? ?  ?  ? ?  ? ? ?Patient will benefit from skilled therapeutic intervention in order to improve the following deficits and impairments:  Abnormal gait, Decreased range of motion, Difficulty walking, Increased muscle spasms, Decreased activity tolerance, Decreased skin integrity, Pain, Decreased scar mobility, Hypomobility, Impaired flexibility, Decreased strength, Decreased mobility, Increased edema ? ?Visit Diagnosis: ?Acute pain of right knee ? ?Stiffness of right knee, not elsewhere classified ? ?Difficulty in walking, not elsewhere classified ? ?Localized muscle weakness ? ? ? ? ?Problem List ?Patient Active Problem List  ? Diagnosis Date Noted  ? Morbid obesity, associated with hypertension, hyperlipidemia, prediabetes, and osteoarthritis ([place] 09/15/2021  ? Status post total right knee replacement 09/07/2021  ? Primary osteoarthritis of right knee 12/30/2020  ? Meniscus, medial, derangement, right 12/30/2020  ? BMI 37.0-37.9, adult 12/10/2020  ? Obstructive sleep apnea syndrome, severe 11/29/2020  ? Hyperlipidemia 09/06/2020  ? Resistant hypertension  09/06/2020  ? Pre-diabetes 09/06/2020  ? 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 Bowel obstruction ([place] 06/11/2019  ? Rheumatoid arthritis involving multiple sites with positive rheumatoid factor ([facility]

## 2021-11-14 ENCOUNTER — Encounter: Payer: Self-pay | Admitting: Family Medicine

## 2021-11-14 ENCOUNTER — Ambulatory Visit (INDEPENDENT_AMBULATORY_CARE_PROVIDER_SITE_OTHER): Payer: 59 | Admitting: Family Medicine

## 2021-11-14 VITALS — BP 120/78 | HR 82 | Temp 97.8°F | Ht 69.0 in | Wt 242.4 lb

## 2021-11-14 DIAGNOSIS — I10 Essential (primary) hypertension: Secondary | ICD-10-CM | POA: Diagnosis not present

## 2021-11-14 DIAGNOSIS — Z96651 Presence of right artificial knee joint: Secondary | ICD-10-CM

## 2021-11-14 DIAGNOSIS — Z23 Encounter for immunization: Secondary | ICD-10-CM

## 2021-11-14 DIAGNOSIS — N200 Calculus of kidney: Secondary | ICD-10-CM | POA: Diagnosis not present

## 2021-11-14 DIAGNOSIS — I1A0 Resistant hypertension: Secondary | ICD-10-CM

## 2021-11-14 NOTE — Addendum Note (Signed)
Addended by: Konrad Saha on: 11/14/2021 01:49 PM ? ? Modules accepted: Orders ? ?

## 2021-11-14 NOTE — Progress Notes (Signed)
?[facility] ?[facility] ?[address] ?[name] [address] ?Dept: [phone] ?Dept Fax: [phone] ? ?Office Visit ? ?Subjective:  ? ? Patient ID: Dwayne Sawyer, adult    DOB: 03-Jun-1971, 51 y.o..   MRN: [id number] ? ?Chief Complaint  ?Patient presents with  ? Follow-up  ?  4 week f/u.  No concerns.   ? ? ?History of Present Illness: ? ?Patient is in today for reassessment of his kidney stone. Dwayne Sawyer was seen on 10/02/2021 at [facility] and diagnosed with a 1-2 mm stone at the left UVJ. He was treated with fluids and Flomax and was able to pass the stone, though he did not retrieve this through straining. He had recurrence of his symptoms. He had not realized until after this, that he had two additional stones seen on the CT scan present in the lower pole of the left kidney. He was having colicky pain associated with urination. I restarted him on Flomax. He notes that within a few days, he passed the stones. He was able to retrieve one of these and noted it was about the size of a [employer] candy. He is pain free now. ? ?Dwayne Sawyer continues to engage in PT s/p right knee joint replacement. He notes this continues to improve. ? ?Dwayne Sawyer  has a history of resistant hypertension. He is currently managed on amlodipine, carvedilol, valsartan and chlorthalidone.  ? ?Past Medical History: ?Patient Active Problem List  ? Diagnosis Date Noted  ? Morbid obesity, associated with hypertension, hyperlipidemia, prediabetes, and osteoarthritis ([place]) 09/15/2021  ? Status post total right knee replacement 09/07/2021  ? Primary osteoarthritis of right knee 12/30/2020  ? Meniscus, medial, derangement, right 12/30/2020  ? BMI 37.0-37.9, adult 12/10/2020  ? Obstructive sleep apnea syndrome, severe 11/29/2020  ? Hyperlipidemia 09/06/2020  ? Resistant hypertension 09/06/2020  ? Pre-diabetes 09/06/2020  ? 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 Bowel obstruction ([place])  06/11/2019  ? Rheumatoid arthritis involving multiple sites with positive rheumatoid factor ([place]) 10/13/2015  ? ?Past Surgical History:  ?Procedure Laterality Date  ? EYE SURGERY Right   ? pt says we he was a baby surgery had to be done on one eye "because it wasn't as strong as the other"  ? HERNIA REPAIR  1988  ? inguinal- pt doesn't remember which side  ? LAPAROTOMY N/A 06/12/2019  ? Procedure: EXPLORATORY LAPAROTOMY WITH SMALL BOWEL RESECTION;  Surgeon: Johnathan Hausen, MD;  Location: [facility];  Service: General;  Laterality: N/A;  ? SHOULDER SURGERY Right 2001  ? rotator cuff repair  ? SMALL INTESTINE SURGERY  06/12/2019  ? part of ex lap procedure. pt states "3 feet of intestine was removed"  ? TONSILLECTOMY    ? as a child  ? TOTAL KNEE ARTHROPLASTY Right 09/07/2021  ? Procedure: RIGHT TOTAL KNEE ARTHROPLASTY;  Surgeon: Leandrew Koyanagi, MD;  Location: [facility];  Service: Orthopedics;  Laterality: Right;  ? ?Family History  ?Problem Relation Age of Onset  ? Cancer Father   ?     widespread carcinoid cancer  ? Hypertension Mother   ? Hyperlipidemia Mother   ? ?Outpatient Medications Prior to Visit  ?Medication Sig Dispense Refill  ? acetaminophen (TYLENOL) 325 MG tablet Take 2 tablets (650 mg total) by mouth every 6 (six) hours as needed. 36 tablet 0  ? amLODipine (NORVASC) 10 MG tablet TAKE ONE TABLET BY MOUTH DAILY 90 tablet 1  ? aspirin EC 81 MG tablet Take  1 tablet (81 mg total) by mouth 2 (two) times daily. To be taken after surgery 84 tablet 0  ? atorvastatin (LIPITOR) 40 MG tablet TAKE ONE TABLET BY MOUTH DAILY AT 6:00 IN THE EVENING 90 tablet 3  ? carvedilol (COREG) 25 MG tablet Take 1 tablet (25 mg total) by mouth 2 (two) times daily with a meal. 180 tablet 3  ? chlorthalidone (HYGROTON) 25 MG tablet Take 1 tablet (25 mg total) by mouth daily. 90 tablet 3  ? docusate sodium (COLACE) 100 MG capsule Take 1 capsule (100 mg total) by mouth daily as needed. 30 capsule 2  ? estrogens, conjugated, (PREMARIN) 0.3 MG  tablet Take 0.3-0.6 mg by mouth See admin instructions. Take 0.6 mg by mouth in the morning and 0.3 mg in the evening    ? metFORMIN (GLUCOPHAGE) 500 MG tablet TAKE ONE TABLET BY MOUTH EVERY MORNING WITH BREAKFAST 90 tablet 1  ? progesterone 200 MG SUPP Place 200 mg vaginally at bedtime.    ? tamsulosin (FLOMAX) 0.4 MG CAPS capsule Take 1 capsule (0.4 mg total) by mouth daily. 30 capsule 3  ? traMADol (ULTRAM) 50 MG tablet Take 1-2 tablets (50-100 mg total) by mouth 3 (three) times daily as needed. 60 tablet 2  ? valsartan (DIOVAN) 320 MG tablet Take 1 tablet (320 mg total) by mouth daily. 90 tablet 3  ? ?No facility-administered medications prior to visit.  ? ?No Known Allergies ?   ?Objective:  ? ?Today's Vitals  ? 11/14/21 1318  ?BP: 120/78  ?Pulse: 82  ?Temp: 97.8 ?F (36.6 ?C)  ?TempSrc: Temporal  ?SpO2: 95%  ?Weight: 242 lb 6.4 oz (110 kg)  ?Height: '5\' 9"'$  (1.753 m)  ? ?Body mass index is 35.8 kg/m?.  ? ?General: Well developed, well nourished. No acute distress. ?Psych: Alert and oriented. Normal mood and affect. ? ?Health Maintenance Due  ?Topic Date Due  ? Hepatitis C Screening  Never done  ? TETANUS/TDAP  Never done  ? Zoster Vaccines- Shingrix (1 of 2) Never done  ? ?Imaging: ?CT Abdomen and Pelvis w contrast (10/25/2021 ?IMPRESSION: ?1. Essentially stable multifocal hepatic metastasis. No new lesions identified. ?2. No evidence of mesenteric metastatic disease or nodal disease. ?3. Post RIGHT hemicolectomy anatomy without complication. ?   ?Assessment & Plan:  ? ?1. 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 This no longer demonstrates the small stones present int he left renal pelvis. He has a stable larger stone present on the left without hydronephrosis. He can stop taking Flomax at this point and we will observe this. ? ?2. Status post total right knee replacement ?Continue physical therapy. ? ?3. Resistant hypertension ?Continue amlodipine 10 mg daily,  carvedilol 25 mg bid, chlorthalidone 25 mg daily, and valsartan 325 mg daily. I will reassess his blood pressure in 3 months. ? ?Return in about 3 months (around 02/13/2022) for Reassessment.  ? ?[name], MD ?

## 2021-11-15 ENCOUNTER — Ambulatory Visit: Payer: 59 | Admitting: Physical Therapy

## 2021-11-15 ENCOUNTER — Encounter: Payer: Self-pay | Admitting: Physical Therapy

## 2021-11-15 DIAGNOSIS — R262 Difficulty in walking, not elsewhere classified: Secondary | ICD-10-CM

## 2021-11-15 DIAGNOSIS — M25561 Pain in right knee: Secondary | ICD-10-CM

## 2021-11-15 DIAGNOSIS — M6281 Muscle weakness (generalized): Secondary | ICD-10-CM

## 2021-11-15 DIAGNOSIS — M25661 Stiffness of right knee, not elsewhere classified: Secondary | ICD-10-CM

## 2021-11-15 NOTE — Therapy (Signed)
[facility] ?[place] ?[address] ?[place], [name], [address] ?Phone: [phone]   Fax:  [phone] ? ?Physical Therapy Treatment ? ?Patient Details  ?Name: Dwayne Sawyer ?MRN: [id number] ?Date of Birth: Apr 07, 1971 ?Referring Provider (PT): Dwana Melena ? ? ?Encounter Date: 11/15/2021 ? ? PT End of Session - 11/15/21 1019   ? ? Visit Number 14   ? Number of Visits 20   ? Date for PT Re-Evaluation 01/10/22   ? Authorization Type [employer]   ? Authorization Time Period 09/29/21 to 11/10/21; extended to 01/05/22   ? Authorization - Number of Visits 30   ? PT Start Time 4259   ? PT Stop Time 1013   ? PT Time Calculation (min) 41 min   ? Activity Tolerance Patient tolerated treatment well   ? Behavior During Therapy [facility] for tasks assessed/performed   ? ?  ?  ? ?  ? ? ?Past Medical History:  ?Diagnosis Date  ? Arthritis   ? Cancer [facility]) 2020  ? liver  ? Diabetes mellitus without complication ([place])   ? type 2- pt was told he was pre-diabetic  ? Family history of carcinoid tumor   ? High cholesterol   ? Hypertension   ? ? ?Past Surgical History:  ?Procedure Laterality Date  ? EYE SURGERY Right   ? pt says we he was a baby surgery had to be done on one eye "because it wasn't as strong as the other"  ? HERNIA REPAIR  1988  ? inguinal- pt doesn't remember which side  ? LAPAROTOMY N/A 06/12/2019  ? Procedure: EXPLORATORY LAPAROTOMY WITH SMALL BOWEL RESECTION;  Surgeon: Johnathan Hausen, MD;  Location: [facility];  Service: General;  Laterality: N/A;  ? SHOULDER SURGERY Right 2001  ? rotator cuff repair  ? SMALL INTESTINE SURGERY  06/12/2019  ? part of ex lap procedure. pt states "3 feet of intestine was removed"  ? TONSILLECTOMY    ? as a child  ? TOTAL KNEE ARTHROPLASTY Right 09/07/2021  ? Procedure: RIGHT TOTAL KNEE ARTHROPLASTY;  Surgeon: Leandrew Koyanagi, MD;  Location: [place] [facility];  Service: Orthopedics;  Laterality: Right;  ? ? ?There were no vitals filed for this visit. ? ? Subjective  Assessment - 11/15/21 0934   ? ? Subjective My knee is feeling tight, I'm not feeling great today as I got a shingles shot yesterday and shots always make me feel extra bad since I'm immunocompressed   ? Patient Stated Goals work on mobility, improve strength   ? Currently in Pain? No/denies   ? ?  ?  ? ?  ? ? ? ? ? ? ? ? ? ? ? ? ? ? ? ? ? ? ? ? [facility] Adult PT Treatment/Exercise - 11/15/21 0001   ? ?  ? Knee/Hip Exercises: Stretches  ? Other Knee/Hip Stretches chair flexion stretch 3x15 seconds   ?  ? Knee/Hip Exercises: Aerobic  ? Recumbent Bike x 6 minutes seat position 11 --> 8   ?  ? Knee/Hip Exercises: Standing  ? Lateral Step Up Both;1 set;15 reps;Hand Hold: 1;Step Height: 6"   ? Forward Step Up 1 set;Both;15 reps;Hand Hold: 1;Step Height: 6"   ? Step Down Both;1 set;15 reps;Hand Hold: 2;Step Height: 4"   ? Step Down Limitations with 2 second hold at the bottom of each rep   ? Other Standing Knee Exercises single leg stability holds 10x10 seconds each LE on foam pad   ?  ?  Knee/Hip Exercises: Seated  ? Sit to Sand 1 set;15 reps;without UE support   focus on eccentric lower back to chair  ? ?  ?  ? ?  ? ? ? ? ? ? ? ? ? ? PT Education - 11/15/21 1018   ? ? Education Details importance of eating regularly especially taking DM medication, HEP updates   ? Person(s) Educated Patient   ? Methods Explanation   ? Comprehension Verbalized understanding   ? ?  ?  ? ?  ? ? ? PT Short Term Goals - 11/07/21 1034   ? ?  ? PT SHORT TERM GOAL #1  ? Title Will be compliant with appropriate progressive HEP   ? Baseline 4/17- compliant and has been going to the gym   ? Time 3   ? Period Weeks   ? Status Achieved   ?  ? PT SHORT TERM GOAL #2  ? Title R knee AROM to be no more than 5 degrees extension and no less than 105 degrees flexion   ? Baseline 3-97   ? Time 1   ? Period Weeks   ? Status Partially Met   ?  ? PT SHORT TERM GOAL #3  ? Title Will be able to ambulate at least 592f with no device, reciprocal gait pattern and equal  step lengths/stance times with quality heel toe pattern   ? Baseline 4/17- no issues but still with mild gait impairment   ? Time 3   ? Period Weeks   ? Status Partially Met   ?  ? PT SHORT TERM GOAL #4  ? Title Will be independent with edema management program   ? Baseline 4/17- compliant   ? Time 3   ? Period Weeks   ? Status Achieved   ? ?  ?  ? ?  ? ? ? ? PT Long Term Goals - 11/07/21 1036   ? ?  ? PT LONG TERM GOAL #1  ? Title MMT to improve by at least 1 grade in all weak groups   ? Baseline 4/17- coming along nicely   ? Time 6   ? Period Weeks   ? Status Partially Met   ?  ? PT LONG TERM GOAL #2  ? Title Will be able to reciprocally ascend/descend at least 4 steps without increase in pain and U UE support   ? Baseline 4/17- moderate difficulty   ? Time 2   ? Period Weeks   ? Status On-going   ?  ? PT LONG TERM GOAL #3  ? Title Will be able to ambulate over uneven surfaces such as grass or gravel with LRAD and no unsteadiness to show improved functional balance   ? Baseline 4/17- coming along but not perfect   ? Time 2   ? Period Weeks   ? Status On-going   ?  ? PT LONG TERM GOAL #4  ? Title Pain in R knee to be no more than 2/10 with all functional weight bearing activities at work and at home   ? Baseline 4/17- no pain   ? Time 2   ? Period Weeks   ? Status Achieved   ? ?  ?  ? ?  ? ? ? ? ? ? ? ? Plan - 11/15/21 1019   ? ? Clinical Impression Statement JMarcasarrives today doing OK, just a little malaised after getting shingles shot yesterday. Doing better with steps but would like to  keep practicing this in therapy. HEP and [employer] activities are going well, still feels challenged. Continued to progress as able. We drop to having PT every other week after today due to high co-pay, encouraged ongoing activity at the gym as much as possible and tolerated, added eccentric stand to sits to HEP to help increase ease of stair descent.   ? Personal Factors and Comorbidities Comorbidity 3+;Finances   ?  Examination-Activity Limitations Locomotion Level;Transfers;Sit;Sleep;Squat;Stairs;Stand;Lift   ? Examination-Participation Restrictions Cleaning;Occupation;Community Activity;Shop;Laundry;[employer] Work   ? Stability/Clinical Decision Making Stable/Uncomplicated   ? Clinical Decision Making Low   ? Rehab Potential Good   ? PT Frequency Other (comment)   ? PT Duration Other (comment)   ? PT Treatment/Interventions ADLs/Self Care Home Management;Cryotherapy;Electrical Stimulation;Iontophoresis 3m/ml Dexamethasone;Moist Heat;Ultrasound;DME Instruction;Gait training;Stair training;Functional mobility training;Therapeutic activities;Therapeutic exercise;Balance training;Neuromuscular re-education;Patient/family education;Manual techniques;Scar mobilization;Passive range of motion;Dry needling;Energy conservation;Taping;Vasopneumatic Device   ? PT Next Visit Plan focus on strength, ROM, stairs, fine tune HEP   ? PT Home Exercise Plan [email or web address]   ? Consulted and Agree with Plan of Care Patient   ? ?  ?  ? ?  ? ? ?Patient will benefit from skilled therapeutic intervention in order to improve the following deficits and impairments:  Abnormal gait, Decreased range of motion, Difficulty walking, Increased muscle spasms, Decreased activity tolerance, Decreased skin integrity, Pain, Decreased scar mobility, Hypomobility, Impaired flexibility, Decreased strength, Decreased mobility, Increased edema ? ?Visit Diagnosis: ?Acute pain of right knee ? ?Stiffness of right knee, not elsewhere classified ? ?Difficulty in walking, not elsewhere classified ? ?Localized muscle weakness ? ? ? ? ?Problem List ?Patient Active Problem List  ? Diagnosis Date Noted  ? Morbid obesity, associated with hypertension, hyperlipidemia, prediabetes, and osteoarthritis ([place] 09/15/2021  ? Status post total right knee replacement 09/07/2021  ? Primary osteoarthritis of right knee 12/30/2020  ? Meniscus, medial, derangement, right 12/30/2020  ? BMI 37.0-37.9,  adult 12/10/2020  ? Obstructive sleep apnea syndrome, severe 11/29/2020  ? Hyperlipidemia 09/06/2020  ? Resistant hypertension 09/06/2020  ? Pre-diabetes 09/06/2020  ? 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 [place]

## 2021-11-22 LAB — HM DIABETES EYE EXAM

## 2021-11-28 ENCOUNTER — Ambulatory Visit: Payer: 59 | Attending: Physician Assistant | Admitting: Physical Therapy

## 2021-11-28 DIAGNOSIS — R262 Difficulty in walking, not elsewhere classified: Secondary | ICD-10-CM | POA: Diagnosis present

## 2021-11-28 DIAGNOSIS — M6281 Muscle weakness (generalized): Secondary | ICD-10-CM | POA: Diagnosis present

## 2021-11-28 DIAGNOSIS — M25561 Pain in right knee: Secondary | ICD-10-CM

## 2021-11-28 DIAGNOSIS — M25661 Stiffness of right knee, not elsewhere classified: Secondary | ICD-10-CM | POA: Diagnosis present

## 2021-11-28 NOTE — Therapy (Signed)
[facility] ?[place] ?[address] ?[place], [name], [address] ?Phone: [phone]   Fax:  [phone] ? ?Physical Therapy Treatment ? ?Patient Details  ?Name: Dwayne Sawyer ?MRN: [id number] ?Date of Birth: 19-Apr-1971 ?Referring Provider (PT): Dwana Melena ? ? ?Encounter Date: 11/28/2021 ? ? PT End of Session - 11/28/21 0909   ? ? Visit Number 15   ? Number of Visits 20   ? Date for PT Re-Evaluation 01/10/22   ? Authorization Type [employer]   ? Authorization Time Period 09/29/21 to 11/10/21; extended to 01/05/22   ? Authorization - Number of Visits 30   ? PT Start Time 0802   ? PT Stop Time [phone]   ? PT Time Calculation (min) 40 min   ? Activity Tolerance Patient tolerated treatment well   ? Behavior During Therapy [facility] for tasks assessed/performed   ? ?  ?  ? ?  ? ? ?Past Medical History:  ?Diagnosis Date  ? Arthritis   ? Cancer [facility]) 2020  ? liver  ? Diabetes mellitus without complication ([place])   ? type 2- pt was told he was pre-diabetic  ? Family history of carcinoid tumor   ? High cholesterol   ? Hypertension   ? ? ?Past Surgical History:  ?Procedure Laterality Date  ? EYE SURGERY Right   ? pt says we he was a baby surgery had to be done on one eye "because it wasn't as strong as the other"  ? HERNIA REPAIR  1988  ? inguinal- pt doesn't remember which side  ? LAPAROTOMY N/A 06/12/2019  ? Procedure: EXPLORATORY LAPAROTOMY WITH SMALL BOWEL RESECTION;  Surgeon: Johnathan Hausen, MD;  [facility];  Service: General;  Laterality: N/A;  ? SHOULDER SURGERY Right 2001  ? rotator cuff repair  ? SMALL INTESTINE SURGERY  06/12/2019  ? part of ex lap procedure. pt states "3 feet of intestine was removed"  ? TONSILLECTOMY    ? as a child  ? TOTAL KNEE ARTHROPLASTY Right 09/07/2021  ? Procedure: RIGHT TOTAL KNEE ARTHROPLASTY;  Surgeon: Leandrew Koyanagi, MD;  Location: [facility];  Service: Orthopedics;  Laterality: Right;  ? ? ?There were no vitals filed for this visit. ? ? Subjective  Assessment - 11/28/21 0803   ? ? Subjective We got a car, I had to sit in the business office and it was a weird angle, I got up and caught my foot on part of it and it was a weird twist on my knee. This was Thursday the week before last. Its gotten better but for awhile it was not happy. Hard to say what makes it feel worse but climbing steps and such has been making it feel worse.   ? Patient Stated Goals work on mobility, improve strength   ? Currently in Pain? Yes   ? Pain Score 1    ? Pain Location Knee   ? Pain Orientation Right   ? Pain Descriptors / Indicators Tightness   ? Pain Type Chronic pain   ? ?  ?  ? ?  ? ? ? ? ? ? ? ? ? ? ? ? ? ? ? ? ? ? ? ? [name] - 11/28/21 0001   ? ?  ? Knee/Hip Exercises: Aerobic  ? Recumbent Bike x 6 minutes seat position 11 --> 7   ?  ? Knee/Hip Exercises: Standing  ? Forward Lunges Right;1 set;15 reps   ? Forward Lunges  Limitations on BOSU   ? Forward Step Up Both;1 set;Hand Hold: 1;Step Height: 6";20 reps   ? Functional Squat 1 set;10 reps   ? Functional Squat Limitations cues to shift onto R LE   ?  ? Knee/Hip Exercises: Supine  ? Straight Leg Raises Both;1 set;15 reps   ? Straight Leg Raises Limitations 2#   ? Straight Leg Raise with External Rotation 1 set;Right;15 reps   ? Straight Leg Raise with External Rotation Limitations 2#   ?  ? Knee/Hip Exercises: Sidelying  ? Hip ABduction Right;1 set;10 reps   ? Hip ABduction Limitations 2#   ? ?  ?  ? ?  ? ? ? ? ? ? Balance Exercises - 11/28/21 0001   ? ?  ? Balance Exercises: Standing  ? Tandem Stance Eyes open;Foam/compliant surface;3 reps;30 secs   ? SLS Eyes open;Solid surface;3 reps;15 secs   ? ?  ?  ? ?  ? ? ? ? ? PT Education - 11/28/21 0909   ? ? Education Details HEP updates, productive pain vs non productive pain   ? Person(s) Educated Patient   ? Methods Explanation   ? Comprehension Verbalized understanding   ? ?  ?  ? ?  ? ? ? PT Short Term Goals - 11/07/21 1034   ? ?  ? PT SHORT TERM  GOAL #1  ? Title Will be compliant with appropriate progressive HEP   ? Baseline 4/17- compliant and has been going to the gym   ? Time 3   ? Period Weeks   ? Status Achieved   ?  ? PT SHORT TERM GOAL #2  ? Title R knee AROM to be no more than 5 degrees extension and no less than 105 degrees flexion   ? Baseline 3-97   ? Time 1   ? Period Weeks   ? Status Partially Met   ?  ? PT SHORT TERM GOAL #3  ? Title Will be able to ambulate at least 542f with no device, reciprocal gait pattern and equal step lengths/stance times with quality heel toe pattern   ? Baseline 4/17- no issues but still with mild gait impairment   ? Time 3   ? Period Weeks   ? Status Partially Met   ?  ? PT SHORT TERM GOAL #4  ? Title Will be independent with edema management program   ? Baseline 4/17- compliant   ? Time 3   ? Period Weeks   ? Status Achieved   ? ?  ?  ? ?  ? ? ? ? PT Long Term Goals - 11/07/21 1036   ? ?  ? PT LONG TERM GOAL #1  ? Title MMT to improve by at least 1 grade in all weak groups   ? Baseline 4/17- coming along nicely   ? Time 6   ? Period Weeks   ? Status Partially Met   ?  ? PT LONG TERM GOAL #2  ? Title Will be able to reciprocally ascend/descend at least 4 steps without increase in pain and U UE support   ? Baseline 4/17- moderate difficulty   ? Time 2   ? Period Weeks   ? Status On-going   ?  ? PT LONG TERM GOAL #3  ? Title Will be able to ambulate over uneven surfaces such as grass or gravel with LRAD and no unsteadiness to show improved functional balance   ? Baseline 4/17- coming along  but not perfect   ? Time 2   ? Period Weeks   ? Status On-going   ?  ? PT LONG TERM GOAL #4  ? Title Pain in R knee to be no more than 2/10 with all functional weight bearing activities at work and at home   ? Baseline 4/17- no pain   ? Time 2   ? Period Weeks   ? Status Achieved   ? ?  ?  ? ?  ? ? ? ? ? ? ? ? Plan - 11/28/21 0910   ? ? Clinical Impression Statement [name] arrives today doing OK, accidentally twisted his knee a  funny way catching his foot on a desk and it has been feeling a little more rough than usual recently. Had to take a break from the gym but trying to get back soon. We just did what we could in terms of strengthening and balance today, added some balance work to [employer] as well.   ? Personal Factors and Comorbidities Comorbidity 3+;Finances   ? Examination-Activity Limitations Locomotion Level;Transfers;Sit;Sleep;Squat;Stairs;Stand;Lift   ? Examination-Participation Restrictions Cleaning;Occupation;Community Activity;Shop;Laundry;[employer] Work   ? Stability/Clinical Decision Making Stable/Uncomplicated   ? Clinical Decision Making Low   ? Rehab Potential Good   ? PT Frequency Other (comment)   ? PT Duration Other (comment)   ? PT Treatment/Interventions ADLs/Self Care Home Management;Cryotherapy;Electrical Stimulation;Iontophoresis 64m/ml Dexamethasone;Moist Heat;Ultrasound;DME Instruction;Gait training;Stair training;Functional mobility training;Therapeutic activities;Therapeutic exercise;Balance training;Neuromuscular re-education;Patient/family education;Manual techniques;Scar mobilization;Passive range of motion;Dry needling;Energy conservation;Taping;Vasopneumatic Device   ? PT Next Visit Plan focus on strength, ROM, stairs, fine tune HEP   ? PT Home Exercise Plan [email or web address]   ? Consulted and Agree with Plan of Care Patient   ? ?  ?  ? ?  ? ? ?Patient will benefit from skilled therapeutic intervention in order to improve the following deficits and impairments:  Abnormal gait, Decreased range of motion, Difficulty walking, Increased muscle spasms, Decreased activity tolerance, Decreased skin integrity, Pain, Decreased scar mobility, Hypomobility, Impaired flexibility, Decreased strength, Decreased mobility, Increased edema ? ?Visit Diagnosis: ?Acute pain of right knee ? ?Stiffness of right knee, not elsewhere classified ? ?Difficulty in walking, not elsewhere classified ? ?Localized muscle weakness ? ? ? ? ?Problem  List ?Patient Active Problem List  ? Diagnosis Date Noted  ? Morbid obesity, associated with hypertension, hyperlipidemia, prediabetes, and osteoarthritis ([place] 09/15/2021  ? Status post total right knee replacement 0

## 2021-11-29 ENCOUNTER — Ambulatory Visit (INDEPENDENT_AMBULATORY_CARE_PROVIDER_SITE_OTHER): Payer: 59 | Admitting: Orthopaedic Surgery

## 2021-11-29 ENCOUNTER — Encounter: Payer: Self-pay | Admitting: Orthopaedic Surgery

## 2021-11-29 DIAGNOSIS — Z96651 Presence of right artificial knee joint: Secondary | ICD-10-CM

## 2021-11-29 NOTE — Progress Notes (Signed)
? ?Post-Op Visit Note ?  ?Patient: Dwayne Sawyer           ?Date of Birth: [birth year]           ?MRN: [id number] ?Visit Date: 11/29/2021 ?PCP: [name], MD ? ? ?Assessment & Plan: ? ?Chief Complaint:  ?Chief Complaint  ?Patient presents with  ? Right Knee - Pain, Follow-up  ? ?Visit Diagnoses:  ?1. Status post total right knee replacement   ? ? ?Plan: Dwayne Sawyer is 3 months status post right total knee replacement.  Doing well overall has no real complaints other than some morning stiffness.  Goes to the [facility] and does physical therapy once every 2 weeks. ? ?Examination of right knee shows a fully healed surgical scar.  Range of motion is functional and acceptable.  Gait and ambulation are both improving.  Stable to varus valgus stress. ? ?Dwayne Sawyer has done well from the surgery.  He will continue to work on range of motion and strength.  Dental prophylaxis reinforced.  Recheck in 3 months with two-view x-rays of the right knee. ? ?Follow-Up Instructions: Return in about 3 months (around 03/01/2022).  ? ?Orders:  ?No orders of the defined types were placed in this encounter. ? ?No orders of the defined types were placed in this encounter. ? ? ?Imaging: ?No results found. ? ?PMFS History: ?Patient Active Problem List  ? Diagnosis Date Noted  ? Morbid obesity, associated with hypertension, hyperlipidemia, prediabetes, and osteoarthritis ([place]) 09/15/2021  ? Status post total right knee replacement 09/07/2021  ? Primary osteoarthritis of right knee 12/30/2020  ? Meniscus, medial, derangement, right 12/30/2020  ? BMI 37.0-37.9, adult 12/10/2020  ? Obstructive sleep apnea syndrome, severe 11/29/2020  ? Hyperlipidemia 09/06/2020  ? Resistant hypertension 09/06/2020  ? Pre-diabetes 09/06/2020  ? 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 Bowel obstruction ([place]) 06/11/2019  ? Rheumatoid arthritis involving multiple sites with positive rheumatoid factor ([place]) 10/13/2015  ? ?Past Medical History:  ?Diagnosis Date  ? Arthritis    ? Cancer [facility]) 2020  ? liver  ? Diabetes mellitus without complication ([place])   ? type 2- pt was told he was pre-diabetic  ? Family history of carcinoid tumor   ? High cholesterol   ? Hypertension   ?  ?Family History  ?Problem Relation Age of Onset  ? Cancer Father   ?     widespread carcinoid cancer  ? Hypertension Mother   ? Hyperlipidemia Mother   ?  ?Past Surgical History:  ?Procedure Laterality Date  ? EYE SURGERY Right   ? pt says we he was a baby surgery had to be done on one eye "because it wasn't as strong as the other"  ? HERNIA REPAIR  1988  ? inguinal- pt doesn't remember which side  ? LAPAROTOMY N/A 06/12/2019  ? Procedure: EXPLORATORY LAPAROTOMY WITH SMALL BOWEL RESECTION;  Surgeon: Johnathan Hausen, MD;  Location: [facility];  Service: General;  Laterality: N/A;  ? SHOULDER SURGERY Right 2001  ? rotator cuff repair  ? SMALL INTESTINE SURGERY  06/12/2019  ? part of ex lap procedure. pt states "3 feet of intestine was removed"  ? TONSILLECTOMY    ? as a child  ? TOTAL KNEE ARTHROPLASTY Right 09/07/2021  ? Procedure: RIGHT TOTAL KNEE ARTHROPLASTY;  Surgeon: Leandrew Koyanagi, MD;  Location: [facility];  Service: Orthopedics;  Laterality: Right;  ? ?Social History  ? ?Occupational History  ? Not on file  ?Tobacco Use  ? Smoking status:  Never  ? Smokeless tobacco: Former  ?  Types: Chew  ?  Quit date: 2020  ?Vaping Use  ? Vaping Use: Never used  ?Substance and Sexual Activity  ? Alcohol use: Yes  ?  Comment: very occasionally (less than a 6 pack of beer a year)  ? Drug use: Never  ? Sexual activity: Not on file  ? ? ? ?

## 2021-12-12 ENCOUNTER — Ambulatory Visit: Payer: 59 | Admitting: Physical Therapy

## 2021-12-14 ENCOUNTER — Telehealth: Payer: Self-pay | Admitting: Orthopaedic Surgery

## 2021-12-14 NOTE — Telephone Encounter (Signed)
Patient called. Says he fell and the knee he had surgery on, he felt it pop. Would like to know if he should come in. His call back number is [phone]

## 2021-12-14 NOTE — Telephone Encounter (Signed)
How is his function.  Can he walk and move his knee ok?

## 2021-12-15 ENCOUNTER — Telehealth: Payer: Self-pay | Admitting: Orthopaedic Surgery

## 2021-12-15 NOTE — Telephone Encounter (Signed)
Hey there. Yes we can see you at 9 am tomorrow. Appointment has been made. Thanks.   Kathlee Nations

## 2021-12-15 NOTE — Telephone Encounter (Signed)
Sent mychart msg.

## 2021-12-15 NOTE — Telephone Encounter (Signed)
Appt made

## 2021-12-15 NOTE — Telephone Encounter (Signed)
Pt called requesting a call back from [place]. Pt states spoke with dr. Erlinda Hong threw my chart and want to see him tomorrow morning for x rays. Please call pt  as soon as you can at [phone].

## 2021-12-16 ENCOUNTER — Ambulatory Visit (INDEPENDENT_AMBULATORY_CARE_PROVIDER_SITE_OTHER): Payer: 59

## 2021-12-16 ENCOUNTER — Encounter: Payer: Self-pay | Admitting: Orthopaedic Surgery

## 2021-12-16 ENCOUNTER — Ambulatory Visit (INDEPENDENT_AMBULATORY_CARE_PROVIDER_SITE_OTHER): Payer: 59 | Admitting: Orthopaedic Surgery

## 2021-12-16 DIAGNOSIS — Z96651 Presence of right artificial knee joint: Secondary | ICD-10-CM

## 2021-12-16 MED ORDER — DICLOFENAC SODIUM 75 MG PO TBEC: 75.0000 mg | DELAYED_RELEASE_TABLET | Freq: Two times a day (BID) | ORAL | 2 refills | Status: DC | PRN

## 2021-12-16 NOTE — Progress Notes (Unsigned)
Post-Op Visit Note   Patient: Dwayne Sawyer           Date of Birth: [birth year]           MRN: [id number] Visit Date: 12/16/2021 PCP: [name], MD   Assessment & Plan:  Chief Complaint:  Chief Complaint  Patient presents with   Right Knee - Pain   Visit Diagnoses:  1. Status post total right knee replacement     Plan: Patient is a pleasant 51 year old gentleman who comes in today approximately 3 months status post right total knee replacement.  He was doing well until this past Wednesday when he hyperflexed the right knee.  He felt a pop similar to what he felt in physical therapy when they were working on range of motion exercises.  He has been able to bear weight to the right knee.  He has not been taking medication for this.  Examination of the right knee reveals a moderate-sized effusion.  He is able to fully straight leg raise.  Range of motion 5 to 110 degrees.  He is stable to valgus and varus stress.  No joint line tenderness.  He is neurovascular tact distally.  At this point, I believe he likely pinched some scar tissue which has likely caused an effusion from the bleeding.  I am not concerned for fracture soft tissue injury given unremarkable x-ray and clinical exam.  He will ice and elevate.  Would also like for him to rest is much as possible.  Have also called in the anti-inflammatory.  Follow-up with [place] at his regular scheduled appointment.  Follow-Up Instructions: Return for f/u at regularly scheduled appointment.   Orders:  Orders Placed This Encounter  Procedures   XR KNEE 3 VIEW RIGHT   Meds ordered this encounter  Medications   diclofenac (VOLTAREN) 75 MG EC tablet    Sig: Take 1 tablet (75 mg total) by mouth 2 (two) times daily as needed.    Dispense:  60 tablet    Refill:  2    Imaging: XR KNEE 3 VIEW RIGHT  Result Date: 12/16/2021 Well-seated prosthesis without complication.   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   Bowel obstruction ([place]) 06/11/2019   Rheumatoid arthritis involving multiple sites with positive rheumatoid factor ([place]) 10/13/2015   Past Medical History:  Diagnosis Date   Arthritis    Cancer ([place]) 2020   liver   Diabetes mellitus without complication ([place])    type 2- pt was told he was pre-diabetic   Family history of carcinoid tumor    High cholesterol    Hypertension     Family History  Problem Relation Age of Onset   Cancer Father        widespread carcinoid cancer   Hypertension Mother    Hyperlipidemia Mother     Past Surgical History:  Procedure Laterality Date   EYE SURGERY Right    pt says we he was a baby surgery had to be done on one eye "because it wasn't as strong  as the other"   HERNIA REPAIR  1988   inguinal- pt doesn't remember which side   LAPAROTOMY N/A 06/12/2019   Procedure: EXPLORATORY LAPAROTOMY WITH SMALL BOWEL RESECTION;  Surgeon: Johnathan Hausen, MD;  [facility]: [facility];  Service: General;  Laterality: N/A;   SHOULDER SURGERY Right 2001   rotator cuff repair   SMALL INTESTINE SURGERY  06/12/2019   part of ex lap procedure. pt states "3 feet of intestine was removed"   TONSILLECTOMY     as a child   TOTAL KNEE ARTHROPLASTY Right 09/07/2021   Procedure: RIGHT TOTAL KNEE ARTHROPLASTY;  Surgeon: Leandrew Koyanagi, MD;  Location: [facility];  Service: Orthopedics;  Laterality: Right;   Social History   Occupational History   Not on file  Tobacco Use   Smoking status:  Never   Smokeless tobacco: Former    Types: Chew    Quit date: 2020  Vaping Use   Vaping Use: Never used  Substance and Sexual Activity   Alcohol use: Yes    Comment: very occasionally (less than a 6 pack of beer a year)   Drug use: Never   Sexual activity: Not on file

## 2021-12-21 ENCOUNTER — Encounter (HOSPITAL_BASED_OUTPATIENT_CLINIC_OR_DEPARTMENT_OTHER): Payer: Self-pay | Admitting: Cardiovascular Disease

## 2021-12-21 NOTE — Telephone Encounter (Signed)
Hey is this something you can help me look into?

## 2021-12-23 ENCOUNTER — Encounter: Payer: Self-pay | Admitting: Family Medicine

## 2021-12-23 ENCOUNTER — Ambulatory Visit (INDEPENDENT_AMBULATORY_CARE_PROVIDER_SITE_OTHER): Payer: 59 | Admitting: Family Medicine

## 2021-12-23 VITALS — BP 134/86 | HR 74 | Temp 97.3°F | Wt 246.0 lb

## 2021-12-23 DIAGNOSIS — W010XXA Fall on same level from slipping, tripping and stumbling without subsequent striking against object, initial encounter: Secondary | ICD-10-CM | POA: Diagnosis not present

## 2021-12-23 DIAGNOSIS — D2221 Melanocytic nevi of right ear and external auricular canal: Secondary | ICD-10-CM | POA: Diagnosis not present

## 2021-12-23 DIAGNOSIS — D225 Melanocytic nevi of trunk: Secondary | ICD-10-CM

## 2021-12-23 NOTE — Progress Notes (Signed)
[facility] [address] [name] [address] Dept: [phone] Dept Fax: [phone]  Office Visit  Subjective:    Patient ID: Dwayne Sawyer, adult    DOB: Dec 12, 1970, 51 y.o..   MRN: [id number]  Chief Complaint  Patient presents with   Spot of concern    Spot of concern on right ear and on chest as well. Would like a neurologist referral. Patient states that his left foot occasionally drags the floor which caused him to fall last Wednesday.     History of Present Illness:  Patient is in today for concerned about a spot on his left pinna. He notes this has been present for some time, but it was worrisome. He also notes he has tow spots on his right breast. One has a dark spot int he center and the other has a mix of colors.  Dwayne Sawyer also notes he had a recent fall at home. He states he tripped over his left foot. He ended up hyperflexing his right knee, which he had replaced back in the winter. He did see Dr. Erlinda Hong, who felt the knee joint replacement was fine and Dwayne Sawyer notes the right knee pain is improving. However, he did raise a concern about recurring episodes of tripping over his left foot. he wonders if he might have a left foot droop. He has not history of stroke or lumbar disc disease. He denies any sensory changes to the left leg. He has not noted any weakness. The tripping occurs on a very rare occasion.  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   Bowel obstruction ([place]) 06/11/2019   Rheumatoid arthritis involving multiple sites with positive rheumatoid factor ([place]) 10/13/2015   Past Surgical History:  Procedure Laterality Date   EYE SURGERY Right    pt says we he was a baby surgery had to be done on one eye "because it wasn't as strong as the other"   [place]   inguinal- pt doesn't remember which side   LAPAROTOMY N/A 06/12/2019   Procedure: EXPLORATORY LAPAROTOMY WITH SMALL BOWEL RESECTION;  Surgeon: Johnathan Hausen, MD;  [facility];  Service: General;  Laterality: N/A;   SHOULDER SURGERY Right 2001   rotator cuff repair   SMALL INTESTINE SURGERY  06/12/2019   part of ex lap procedure. pt states "3 feet of intestine was removed"   TONSILLECTOMY     as a child   TOTAL KNEE ARTHROPLASTY Right 09/07/2021   Procedure: RIGHT TOTAL KNEE ARTHROPLASTY;  Surgeon: Leandrew Koyanagi, MD;  Location: [facility];  Service: Orthopedics;  Laterality: Right;   Family History  Problem Relation Age of Onset   Cancer Father        widespread carcinoid cancer   Hypertension Mother    Hyperlipidemia Mother    Outpatient Medications Prior to Visit  Medication Sig Dispense Refill   acetaminophen (TYLENOL) 325 MG tablet Take 2 tablets (650 mg total) by mouth every 6 (six) hours as needed. 36 tablet 0  amLODipine (NORVASC) 10 MG tablet TAKE ONE TABLET BY MOUTH DAILY 90 tablet 1   aspirin EC 81 MG tablet Take 1 tablet (81 mg total) by mouth 2 (two) times daily. To be taken after surgery 84 tablet 0   atorvastatin (LIPITOR) 40 MG tablet TAKE ONE TABLET BY MOUTH DAILY AT 6:00 IN THE EVENING 90 tablet 3   carvedilol (COREG) 25 MG tablet Take 1 tablet (25 mg total) by mouth 2 (two) times daily with a meal. 180 tablet 3   chlorthalidone (HYGROTON) 25 MG tablet Take 1 tablet (25 mg total) by mouth daily. 90 tablet 3   diclofenac (VOLTAREN) 75 MG EC tablet Take 1 tablet (75 mg total)  by mouth 2 (two) times daily as needed. 60 tablet 2   docusate sodium (COLACE) 100 MG capsule Take 1 capsule (100 mg total) by mouth daily as needed. 30 capsule 2   estrogens, conjugated, (PREMARIN) 0.3 MG tablet Take 0.3-0.6 mg by mouth See admin instructions. Take 0.6 mg by mouth in the morning and 0.3 mg in the evening     metFORMIN (GLUCOPHAGE) 500 MG tablet TAKE ONE TABLET BY MOUTH EVERY MORNING WITH BREAKFAST 90 tablet 1   progesterone 200 MG SUPP Place 200 mg vaginally at bedtime.     tamsulosin (FLOMAX) 0.4 MG CAPS capsule Take 1 capsule (0.4 mg total) by mouth daily. 30 capsule 3   valsartan (DIOVAN) 320 MG tablet Take 1 tablet (320 mg total) by mouth daily. 90 tablet 3   traMADol (ULTRAM) 50 MG tablet Take 1-2 tablets (50-100 mg total) by mouth 3 (three) times daily as needed. (Patient not taking: Reported on 12/23/2021) 60 tablet 2   No facility-administered medications prior to visit.   No Known Allergies    Objective:   Today's Vitals   12/23/21 0903  BP: 134/86  Pulse: 74  Temp: (!) 97.3 F (36.3 C)  TempSrc: Temporal  SpO2: 98%  Weight: 246 lb (111.6 kg)   Body mass index is 36.33 kg/m.   General: Well developed, well nourished. No acute distress. Extremities: Full ROM of left leg. No joint swelling or tenderness. Strength 5/5 throughout. DTR 2+ at patella   and Achilles. Normal sensation. Normal gait. Skin: There is a 3-4 mm brown raised lesion with slight scale inside of the right pinna. There are 2 irregular   brown macules on the right breast, measuring about 1 cm. One has a central dark spot, which appears to be   at a hair follicle. The other has irregular borders and a mix of brown and blue pigmentation. Psych: Alert and oriented. Normal mood and affect.  Health Maintenance Due  Topic Date Due   Hepatitis C Screening  Never done   TETANUS/TDAP  Never done     Assessment & Plan:   1. Melanocytic nevus of right pinna This is possibly a seborrheic  keratosis, though I can't rule out actinic keratosis or other precancerous lesion. I will refer Dwayne Sawyer to dermatology.  - Ambulatory referral to Dermatology  2. Melanocytic nevi of trunk One of these lesions has some characteristics that could be a melanoma. I will refer him to dermatology to consider biopsy.  - Ambulatory referral to Dermatology  3. Fall on same level from tripping I see no evidence of a foot droop being present. He is neurologically intact. I suspect this may be clumsy inattention when this occurs. We will observe this for now. If this starts to occur more frequently, Mr. Ederer  will follow up for reassessment.   Return for As scheduled.   [name], MD

## 2021-12-28 ENCOUNTER — Telehealth: Payer: Self-pay | Admitting: Family Medicine

## 2021-12-28 NOTE — Telephone Encounter (Signed)
Pt said that [facility] is not taking new pts and needs a different referral.

## 2021-12-28 NOTE — Telephone Encounter (Signed)
According to his insurance this is the next closest location that's in network  [place], [name] [id number] miles [phone]

## 2021-12-28 NOTE — Telephone Encounter (Signed)
I spoke to patient and he is okay with going to [place].  Can you please send to them.  Gave information so he can call if he hasn't heard in 1 week from them.   Thank you.  Dm/cma

## 2022-01-02 ENCOUNTER — Encounter (HOSPITAL_BASED_OUTPATIENT_CLINIC_OR_DEPARTMENT_OTHER): Payer: Self-pay | Admitting: Family

## 2022-01-02 ENCOUNTER — Ambulatory Visit (INDEPENDENT_AMBULATORY_CARE_PROVIDER_SITE_OTHER): Payer: 59 | Admitting: Family

## 2022-01-02 VITALS — BP 126/78 | Ht 69.0 in | Wt 241.0 lb

## 2022-01-02 DIAGNOSIS — I10 Essential (primary) hypertension: Secondary | ICD-10-CM

## 2022-01-02 DIAGNOSIS — E782 Mixed hyperlipidemia: Secondary | ICD-10-CM

## 2022-01-02 DIAGNOSIS — G4733 Obstructive sleep apnea (adult) (pediatric): Secondary | ICD-10-CM

## 2022-01-02 DIAGNOSIS — R0683 Snoring: Secondary | ICD-10-CM | POA: Diagnosis not present

## 2022-01-02 MED ORDER — CARVEDILOL 25 MG PO TABS: 25.0000 mg | ORAL_TABLET | Freq: Two times a day (BID) | ORAL | 3 refills | Status: DC

## 2022-01-02 MED ORDER — ATORVASTATIN CALCIUM 40 MG PO TABS: ORAL_TABLET | ORAL | 3 refills | Status: DC

## 2022-01-02 NOTE — Addendum Note (Signed)
Addended by: Freada Bergeron on: 01/02/2022 10:54 AM   Modules accepted: Orders

## 2022-01-02 NOTE — Progress Notes (Signed)
Virtual Visit via Telephone Note   Because of Dwayne Sawyer's co-morbid illnesses, he is at least at moderate risk for complications without adequate follow up.  This format is felt to be most appropriate for this patient at this time.  The patient did not have access to video technology/had technical difficulties with video requiring transitioning to audio format only (telephone).  All issues noted in this document were discussed and addressed.  No physical exam could be performed with this format.  Please refer to the patient's chart for his consent to telehealth for [facility].    Date:  01/02/2022   ID:  Dwayne Sawyer, DOB Sep 30, 1970, MRN [id number] The patient was identified using 2 identifiers.  Patient Location: Home Provider Location: Office/Clinic  PCP:  [name], MD   [place] Cardiologist:  Skeet Latch, MD     Evaluation Performed:  Follow-Up Visit  Chief Complaint:  Discuss CPAP  History of Present Illness:    Dwayne Sawyer is a 51 y.o. adult with history of carcinoid tumor, hypertension, hyperlipidemia, OSA, aortic atherosclerosis, RA.  Initially established with hypertension clinic March 2022.  Diagnosed with hypertension 15 or 20 years prior.  BP initially easy to control however in more recent years more difficult.  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  Secondary work-up for hypertension included renal artery dopplers were unremarkable.  He stopped estrogen/progesterone but no difference in blood pressures and it was resumed.  He had sleep study 01/2021 showing sleep apnea and subsequently underwent CPAP titration study.  He was recommended for CPAP.  Last seen 03/23/2021 by Dr. Oval Linsey.  He was feeling well from a cardiac perspective.  Blood pressure 120s to 130s over  80s.  He underwent right total knee replacement 09/07/2021.  Presents today for follow-up via phone. Does feel his mobility is improved. He has completed physical therapy and is participating water aerobics at the gym. His blood pressure has been well controlled at home at goal of <130/80. Reports no shortness of breath nor dyspnea on exertion. Reports no chest pain, pressure, or tightness. No edema, orthopnea, PND. Reports no palpitations.  Still snores and his wife witnesses him stop breathing while sleeping.  He remains interested in CPAP and is hopeful to start soon.  Past Medical History:  Diagnosis Date   Arthritis    Cancer ([place]) 2020   liver   Diabetes mellitus without complication ([place])    type 2- pt was told he was pre-diabetic   Family history of carcinoid tumor    High cholesterol    Hypertension    Past Surgical History:  Procedure Laterality Date   EYE SURGERY Right    pt says we he was a baby surgery had to be done on one eye "because it wasn't as strong as the other"   [place]   inguinal- pt doesn't remember which side   LAPAROTOMY N/A 06/12/2019   Procedure: EXPLORATORY LAPAROTOMY WITH SMALL BOWEL RESECTION;  Surgeon: Johnathan Hausen, MD;  Location: [facility];  Service: General;  Laterality: N/A;   SHOULDER SURGERY Right 2001   rotator cuff repair   SMALL INTESTINE SURGERY  06/12/2019   part of ex lap procedure. pt states "3 feet of intestine was removed"   TONSILLECTOMY     as a child   TOTAL KNEE ARTHROPLASTY Right  09/07/2021   Procedure: RIGHT TOTAL KNEE ARTHROPLASTY;  Surgeon: Leandrew Koyanagi, MD;  Location: [facility];  Service: Orthopedics;  Laterality: Right;     Current Meds  Medication Sig   acetaminophen (TYLENOL) 325 MG tablet Take 2 tablets (650 mg total) by mouth every 6 (six) hours as needed.   amLODipine (NORVASC) 10 MG tablet TAKE ONE TABLET BY MOUTH DAILY   aspirin EC 81 MG tablet Take 1 tablet (81 mg total) by mouth 2 (two) times daily. To be  taken after surgery   chlorthalidone (HYGROTON) 25 MG tablet Take 1 tablet (25 mg total) by mouth daily.   diclofenac (VOLTAREN) 75 MG EC tablet Take 1 tablet (75 mg total) by mouth 2 (two) times daily as needed.   docusate sodium (COLACE) 100 MG capsule Take 1 capsule (100 mg total) by mouth daily as needed.   estrogens, conjugated, (PREMARIN) 0.3 MG tablet Take 0.3-0.6 mg by mouth See admin instructions. Take 0.6 mg by mouth in the morning and 0.3 mg in the evening   metFORMIN (GLUCOPHAGE) 500 MG tablet TAKE ONE TABLET BY MOUTH EVERY MORNING WITH BREAKFAST   progesterone 200 MG SUPP Place 200 mg vaginally at bedtime.   tamsulosin (FLOMAX) 0.4 MG CAPS capsule Take 1 capsule (0.4 mg total) by mouth daily.   valsartan (DIOVAN) 320 MG tablet Take 1 tablet (320 mg total) by mouth daily.   [DISCONTINUED] atorvastatin (LIPITOR) 40 MG tablet TAKE ONE TABLET BY MOUTH DAILY AT 6:00 IN THE EVENING   [DISCONTINUED] carvedilol (COREG) 25 MG tablet Take 1 tablet (25 mg total) by mouth 2 (two) times daily with a meal.    Allergies:   Patient has no known allergies.   Social History   Tobacco Use   Smoking status: Never   Smokeless tobacco: Former    Types: Chew    Quit date: 2020  Vaping Use   Vaping Use: Never used  Substance Use Topics   Alcohol use: Yes    Comment: very occasionally (less than a 6 pack of beer a year)   Drug use: Never     Family Hx: The patient's family history includes Cancer in his father; Hyperlipidemia in his mother; Hypertension in his mother.  ROS:   Please see the history of present illness.     All other systems reviewed and are negative.   Prior CV studies:   The following studies were reviewed today:  Renal duplex 09/2020 Summary:  Largest Aortic Diameter: 2.2 cm    Renal:    Right: Normal size right kidney. Normal right Resisitive Index.         Normal cortical thickness of right kidney. No evidence of         right renal artery stenosis. RRV flow  present.  Left:  Normal size of left kidney. Normal left Resistive Index.         Normal cortical thickness of the left kidney. No evidence of         left renal artery stenosis. LRV flow present.  Mesenteric:  Normal Celiac artery and Superior Mesenteric artery findings.    Technically difficult study due to abdominal girth, bowel gas and flash  artifact.   Labs/Other Tests and Data Reviewed:    EKG:  No ECG reviewed.  Recent Labs: 10/19/2021: ALT 13; BUN 24; Creatinine 1.30; Hemoglobin 11.2; Platelet Count 333; Potassium 3.8; Sodium 139   Recent Lipid Panel Lab Results  Component Value Date/Time   CHOL 135 09/16/2021  02:11 PM   TRIG 177 (H) 09/16/2021 02:11 PM   HDL 43 09/16/2021 02:11 PM   CHOLHDL 3.1 09/16/2021 02:11 PM   LDLCALC 57 09/16/2021 02:11 PM   LDLDIRECT 88.0 09/06/2020 10:46 AM    Wt Readings from Last 3 Encounters:  01/02/22 241 lb (109.3 kg)  12/23/21 246 lb (111.6 kg)  11/14/21 242 lb 6.4 oz (110 kg)     Objective:    Vital Signs:  BP 126/78   Ht '5\' 9"'$  (1.753 m)   Wt 241 lb (109.3 kg)   BMI 35.59 kg/m    VITAL SIGNS:  reviewed  ASSESSMENT & PLAN:    Resistant hypertension - Prior vascular renal duplex, TSH unremarkable.  BP staying at goal of less than 130/80.  Continue amlodipine 10 mg daily, carvedilol 25 mg twice daily, chlorthalidone 25 mg daily, Valsartan '320mg'$  daily. Heart healthy diet and regular cardiovascular exercise encouraged.    OSA - Snores and apneic episodes witnessed by wife.  Prior sleep study showed sleep apnea.  He has underwent CPAP titration study.  Recommended initiation of CPAP.  HLD -08/2021 total cholesterol 135, triglycerides 177, HDL 43, LDL 57.  Continue atorvastatin 40 mg daily.  Lipid lowering diet discussed.  Obesity - Weight loss via diet and exercise encouraged. Discussed the impact being overweight would have on cardiovascular risk.  Time:   Today, I have spent 9 minutes with the patient with telehealth  technology discussing the above problems.     Medication Adjustments/Labs and Tests Ordered: Current medicines are reviewed at length with the patient today.  Concerns regarding medicines are outlined above.   Tests Ordered: No orders of the defined types were placed in this encounter.   Medication Changes: Meds ordered this encounter  Medications   atorvastatin (LIPITOR) 40 MG tablet    Sig: TAKE ONE TABLET BY MOUTH DAILY AT 6:00 IN THE EVENING    Dispense:  90 tablet    Refill:  3    INCREASED DOSE    Order Specific Question:   Supervising Provider    Answer:   Buford Dresser [[id number]]   carvedilol (COREG) 25 MG tablet    Sig: Take 1 tablet (25 mg total) by mouth 2 (two) times daily with a meal.    Dispense:  180 tablet    Refill:  3    Order Specific Question:   Supervising Provider    AnswerBuford Dresser [id number]    Follow Up:  In Person in 3 month(s)  Signed, Loel Dubonnet, NP  01/02/2022 5:05 PM    [facility]

## 2022-01-02 NOTE — Patient Instructions (Addendum)
Medication Instructions:  Continue your current medications.   *If you need a refill on your cardiac medications before your next appointment, please call your pharmacy*  Lab Work: None ordered today.   Testing/Procedures: None ordered today.   We will have our sleep team reach out to you about your CPAP.   Follow-Up: At [facility], you and your health needs are our priority.  As part of our continuing mission to provide you with exceptional heart care, we have created designated Provider Care Teams.  These Care Teams include your primary Cardiologist (physician) and Advanced Practice Providers (APPs -  Physician Assistants and Nurse Practitioners) who all work together to provide you with the care you need, when you need it.  We recommend signing up for the patient portal called "MyChart".  Sign up information is provided on this After Visit Summary.  MyChart is used to connect with patients for Virtual Visits (Telemedicine).  Patients are able to view lab/test results, encounter notes, upcoming appointments, etc.  Non-urgent messages can be sent to your provider as well.   To learn more about what you can do with MyChart, go to [email or web address].    Your next appointment:   September 1st at 9:20 AM with Dr. Oval Linsey  [address] [place], [place] [address]  Park on the second floor of the parking deck and walk directly into our waiting room!   Other Instructions  Heart Healthy Diet Recommendations: A low-salt diet is recommended. Meats should be grilled, baked, or boiled. Avoid fried foods. Focus on lean protein sources like fish or chicken with vegetables and fruits. The American Heart Association is a Microbiologist!  American Heart Association Diet and Lifeystyle Recommendations    Exercise recommendations: The American Heart Association recommends 150 minutes of moderate intensity exercise weekly. Try 30 minutes of moderate intensity exercise 4-5  times per week. This could include walking, jogging, or swimming.   Important Information About Sugar

## 2022-01-16 ENCOUNTER — Telehealth: Payer: Self-pay | Admitting: Hematology

## 2022-01-16 NOTE — Telephone Encounter (Signed)
Left message with rescheduled upcoming appointment due to provider's PAL. 

## 2022-01-19 ENCOUNTER — Encounter: Payer: Self-pay | Admitting: Hematology

## 2022-01-19 ENCOUNTER — Inpatient Hospital Stay (HOSPITAL_BASED_OUTPATIENT_CLINIC_OR_DEPARTMENT_OTHER): Payer: 59 | Admitting: Hematology

## 2022-01-19 ENCOUNTER — Inpatient Hospital Stay: Payer: 59 | Attending: Hematology

## 2022-01-19 ENCOUNTER — Other Ambulatory Visit: Payer: Self-pay

## 2022-01-19 VITALS — BP 134/77 | HR 82 | Temp 98.7°F | Resp 17 | Ht 69.0 in | Wt 254.0 lb

## 2022-01-19 DIAGNOSIS — M069 Rheumatoid arthritis, unspecified: Secondary | ICD-10-CM | POA: Diagnosis not present

## 2022-01-19 DIAGNOSIS — C7A8 Other malignant neuroendocrine tumors: Secondary | ICD-10-CM | POA: Diagnosis not present

## 2022-01-19 DIAGNOSIS — C7A011 Malignant carcinoid tumor of the jejunum: Secondary | ICD-10-CM

## 2022-01-19 DIAGNOSIS — E1165 Type 2 diabetes mellitus with hyperglycemia: Secondary | ICD-10-CM | POA: Diagnosis not present

## 2022-01-19 DIAGNOSIS — D3A011 Benign carcinoid tumor of the jejunum: Secondary | ICD-10-CM | POA: Diagnosis not present

## 2022-01-19 DIAGNOSIS — C7B8 Other secondary neuroendocrine tumors: Secondary | ICD-10-CM | POA: Insufficient documentation

## 2022-01-19 DIAGNOSIS — Z96651 Presence of right artificial knee joint: Secondary | ICD-10-CM | POA: Diagnosis not present

## 2022-01-19 DIAGNOSIS — I10 Essential (primary) hypertension: Secondary | ICD-10-CM | POA: Insufficient documentation

## 2022-01-19 LAB — CBC WITH DIFFERENTIAL (CANCER CENTER ONLY)
Abs Immature Granulocytes: 0.03 10*3/uL (ref 0.00–0.07)
Basophils Absolute: 0.1 10*3/uL (ref 0.0–0.1)
Basophils Relative: 1 %
Eosinophils Absolute: 0.2 10*3/uL (ref 0.0–0.5)
Eosinophils Relative: 2 %
HCT: 35.5 % — ABNORMAL LOW (ref 39.0–52.0)
Hemoglobin: 12 g/dL — ABNORMAL LOW (ref 13.0–17.0)
Immature Granulocytes: 0 %
Lymphocytes Relative: 11 %
Lymphs Abs: 1 10*3/uL (ref 0.7–4.0)
MCH: 29.3 pg (ref 26.0–34.0)
MCHC: 33.8 g/dL (ref 30.0–36.0)
MCV: 86.6 fL (ref 80.0–100.0)
Monocytes Absolute: 0.5 10*3/uL (ref 0.1–1.0)
Monocytes Relative: 5 %
Neutro Abs: 7 10*3/uL (ref 1.7–7.7)
Neutrophils Relative %: 81 %
Platelet Count: 319 10*3/uL (ref 150–400)
RBC: 4.1 MIL/uL — ABNORMAL LOW (ref 4.22–5.81)
RDW: 13.3 % (ref 11.5–15.5)
WBC Count: 8.7 10*3/uL (ref 4.0–10.5)
nRBC: 0 % (ref 0.0–0.2)

## 2022-01-19 LAB — CMP (CANCER CENTER ONLY)
ALT: 13 U/L (ref 0–44)
AST: 11 U/L — ABNORMAL LOW (ref 15–41)
Albumin: 4.1 g/dL (ref 3.5–5.0)
Alkaline Phosphatase: 66 U/L (ref 38–126)
Anion gap: 10 (ref 5–15)
BUN: 21 mg/dL — ABNORMAL HIGH (ref 6–20)
CO2: 27 mmol/L (ref 22–32)
Calcium: 9.7 mg/dL (ref 8.9–10.3)
Chloride: 100 mmol/L (ref 98–111)
Creatinine: 1.19 mg/dL (ref 0.61–1.24)
GFR, Estimated: >60 mL/min (ref >60)
Glucose, Bld: 164 mg/dL — ABNORMAL HIGH (ref 70–99)
Potassium: 3.7 mmol/L (ref 3.5–5.1)
Sodium: 137 mmol/L (ref 135–145)
Total Bilirubin: 0.3 mg/dL (ref 0.3–1.2)
Total Protein: 7.4 g/dL (ref 6.5–8.1)

## 2022-01-19 NOTE — Progress Notes (Addendum)
[place]   [phone] [phone]) [phone]   Clinic Follow up Note   Patient Care Team: [name], MD as PCP - General (Family Medicine) Skeet Latch, MD as PCP - Cardiology (Cardiology) Gregor Hams, MD as Consulting Physician (Sports Medicine)  Date of Service:  01/19/2022  CHIEF COMPLAINT: f/u of neuroendocrine tumor  CURRENT THERAPY:  Surveillance  ASSESSMENT & PLAN:  Dwayne Sawyer is a 51 y.o. adult with   1. 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 Pathology confirmed well differentiated neuroendocrine tumor, with metastasis to 14 of 32 LNs. Margins clear.  -07/16/19 PET shows two right hepatic lesion which are intensely hypermetabolic consistent with metastatic NET. There is also a smaller less avid left hepatic lesion that is concerning for metastasis which was not seen on CT from 06/11/19. No other evidence of mets -His MRI abdomen from 08/04/19 shows 3 liver lesions (0.8-4.4cm) compatible with neuroendocrine metastatic lesions.  -He was seen by Dr. Barry Dienes and she did not recommend surgery due to the multiple liver mets.  -He was on monthly Lanreotide injections since 08/01/19. He was found to have progression on 05/31/20 DOTATATE PET scan, and received 4 doses of Lutathera 08/17/20 - 03/17/21. -restaging DOTATATE PET on 04/28/21 showed positive response to treatment. We discontinued his lanreotide injections after 05/13/21. -surveillance CT AP on 10/25/21 showed stable disease  -he is feeling well overall, with no recurrent diarrhea or flushing. Labs reviewed, stable off treatment. Plan for repeat CT in 3 months.   2. RA -previously on methotrexate and enbrel -s/p right knee replacement on 09/07/21 under Dr. Erlinda Hong.   3. Hypertension, Hyperglycemia -now under care of Dr. Gena Fray, BP better controlled.   4.  Transgender and estrogen use -Dwayne Sawyer has started estrogen with Mollie Germany, NP in 10/2020 with goal of transgender transitioning.  -I checked his neuroendocrine ER and PR which were negative, so there is no contraindication for estrogen use. Can continue.     PLAN: -f/u in 3 months, with lab and CT several days before   No problem-specific Assessment & Plan notes found for this encounter.   SUMMARY OF ONCOLOGIC HISTORY: Oncology History Overview Note  Cancer Staging No matching staging information was found for the patient.    Carcinoid tumor of small intestine  06/11/2019 Imaging   US Abdomen 06/11/19  IMPRESSION: 1. There is a solid mass in the right lobe of the liver which based on current measurements may have enlarged since the prior study from 2015. Images from the previous MR at that time cannot be retrieved. Given this circumstance, it may be prudent to correlate with pre and serial post-contrast MR or CT of the liver to further evaluate. There is underlying diffuse increase in liver echogenicity, a finding indicative of hepatic steatosis.   2. Multiple loops of fluid-filled bowel. Question a degree of ileus or enteritis.   3. Study otherwise unremarkable. Note that much of the common bile duct is obscured by gas.    06/11/2019 Imaging   CT AP W Contrast 06/11/19   IMPRESSION: 1. High-grade small bowel obstruction secondary to desmoplastic response in the central right abdominal mesentery centered on a 4 cm calcified irregular/spiculated soft tissue lesion. Abrupt small bowel transition zone is identified immediately lead adjacent to this mesenteric lesion and multiple adjacent bowel loops are tethered into this region with mesenteric edema/congestion. The loop  of small bowel immediately proximal to the transition zone shows mild circumferential wall thickening but no pneumatosis. Imaging features are highly suggestive of metastatic small-bowel carcinoid tumor  with small bowel obstruction. 2. Small lymph nodes in the abnormal right mesentery suggest additional metastatic involvement. 3. Heterogeneous liver parenchyma, likely secondary to geographic fatty deposition. There is a focal 14 mm low-density lesion in the right liver. The patient had an MRI in the [facility] system on 08/09/2013 to evaluate a right liver lesion, but those images are not available. Follow-up MRI without and with contrast recommended to exclude metastatic disease. 4. Tiny sclerotic foci in the T12 vertebral body in both femoral heads are likely benign, but close attention on follow-up recommended. 5.  Aortic Atherosclerois (ICD10-170.0)      06/12/2019 Surgery    EXPLORATORY LAPAROTOMY WITH SMALL BOWEL RESECTION by Dr. 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 Interval resection small bowel and mesenteric mass with no evidence residual mesenteric or small bowel neuroendocrine tumor. 2. Large lesion in RIGHT hepatic lobe with intense radiotracer activity consistent with well differentiated neuroendocrine tumor metastasis. Smaller lesion in the anterior LEFT hepatic lobe is concerning for second hepatic metastasis. 3. Pulmonary nodule measuring 6 mm in LEFT upper lobe is not have radiotracer activity. Smaller RIGHT upper lobe nodule. Recommend close attention on follow-up.   08/01/2019 - 05/07/2020 Chemotherapy   Lanreotide injection  monthly starting 08/01/19. Stopped after 05/07/20 due to disease progression     08/04/2019 Imaging   MRI abdomen  IMPRESSION: 1. The previously noted lesion in the right lobe of the liver has grown slightly compared to 2015. In addition, today's study demonstrates 2 smaller lesions with similar imaging characteristics. Given the activity on the recent PET Dotatate scan, these lesions are all compatible with small neuroendocrine metastatic lesions. 2. Hepatic steatosis.   02/10/2020 Imaging   MRI abdomen  IMPRESSION: 1. The dominant right hepatic lobe mass has mildly increased in size compared to the prior exam. This currently measures 5.4 by 4.5 cm, previously 4.9 by 4.2 cm. 2. Nine additional small T2 hyperintense foci in the liver are stable, and while the small size makes these less specific, there likely small metastatic lesions. Today's exam has the benefit of less motion artifact compared to the 06/03/2020 MRI, and I was able to pick at each of these tiny lesions on the prior exam in retrospect. It is conceivable that 1 or more of these tiny lesions could represent small hemangiomas, although I do not observe classic delayed enhancement pattern. 3. Diffuse hepatic steatosis.   05/31/2020 PET scan   DOTATATE PET  IMPRESSION: 1. Progression of well differentiated neuroendocrine tumor hepatic metastasis with increase in size and radiotracer activity of dominant lesion in the LEFT hepatic lobe and multiple small lesions as described above. 2. New small focus of radiotracer activity within the head of the pancreas. 3. Increased hepatic steatosis.      Chemotherapy   Lutathera treatments on 08/11/20,  10/06/20, 12/01/20, 01/26/21      04/28/2021 PET scan   NETSPOT GA 68 DOTATATE  IMPRESSION: 1. Interval decrease in radiotracer activity of neuroendocrine tumor hepatic metastasis. Dominant lesion the RIGHT hepatic lobe is mildly in size. Several small peripheral lesions now have  no radiotracer activity. Lesions are conspicuous in liver on noncontrast exam due to hepatic steatosis. 2. Small lesion in the head of the pancreas ahs decreased radiotracer activity. Lesion difficult to define on CT portion exam. 3. No evidence new metastatic neuroendocrine tumor. 4. Post RIGHT hemicolectomy anatomy without complication 5. New bilateral symmetric gynecomastia.      INTERVAL HISTORY:  Dwayne Sawyer is here for a follow up of NET. He was last seen by me on 09/23/21. He presents to the clinic alone. He reports he is doing well overall, no new concerns.   All other systems were reviewed with the patient and are negative.  MEDICAL HISTORY:  Past Medical History:  Diagnosis Date   Arthritis    Cancer ([place]) 2020   liver   Diabetes mellitus without complication ([place])    type 2- pt was told he was pre-diabetic   Family history of carcinoid tumor    High cholesterol    Hypertension     SURGICAL HISTORY: Past Surgical History:  Procedure Laterality Date   EYE SURGERY Right    pt says we he was a baby surgery had to be done on one eye "because it wasn't as strong as the other"   [place]   inguinal- pt doesn't remember which side   LAPAROTOMY N/A 06/12/2019   Procedure: EXPLORATORY LAPAROTOMY WITH SMALL BOWEL RESECTION;  Surgeon: Johnathan Hausen, MD;  Location: [facility];  Service: General;  Laterality: N/A;   SHOULDER SURGERY Right 2001   rotator cuff repair   SMALL INTESTINE SURGERY  06/12/2019   part of ex lap procedure. pt states "3 feet of intestine was removed"   TONSILLECTOMY     as a child   TOTAL KNEE ARTHROPLASTY Right 09/07/2021   Procedure: RIGHT TOTAL KNEE ARTHROPLASTY;  Surgeon: Leandrew Koyanagi, MD;  Location: [place];  Service: Orthopedics;  Laterality: Right;    I have reviewed the social history and family history with the patient and they are unchanged from previous note.  ALLERGIES:  has No Known Allergies.  MEDICATIONS:  Current  Outpatient Medications  Medication Sig Dispense Refill   acetaminophen (TYLENOL) 325 MG tablet Take 2 tablets (650 mg total) by mouth every 6 (six) hours as needed. 36 tablet 0   amLODipine (NORVASC) 10 MG tablet TAKE ONE TABLET BY MOUTH DAILY 90 tablet 1   aspirin EC 81 MG tablet Take 1 tablet (81 mg total) by mouth 2 (two) times daily. To be taken after surgery 84 tablet 0   atorvastatin (LIPITOR) 40 MG tablet TAKE ONE TABLET BY MOUTH DAILY AT 6:00 IN THE EVENING 90 tablet 3   carvedilol (COREG) 25 MG tablet Take 1 tablet (25 mg total) by mouth 2 (two) times daily with a meal. 180 tablet 3   chlorthalidone (HYGROTON) 25 MG tablet Take 1 tablet (25 mg total) by mouth daily. 90 tablet 3   diclofenac (VOLTAREN) 75 MG EC tablet Take 1 tablet (75 mg total) by mouth 2 (two) times daily as needed. 60 tablet 2   docusate sodium (COLACE) 100 MG capsule Take 1 capsule (100 mg total) by mouth daily as needed. 30 capsule 2   estrogens, conjugated, (PREMARIN) 0.3 MG  tablet Take 0.3-0.6 mg by mouth See admin instructions. Take 0.6 mg by mouth in the morning and 0.3 mg in the evening     metFORMIN (GLUCOPHAGE) 500 MG tablet TAKE ONE TABLET BY MOUTH EVERY MORNING WITH BREAKFAST 90 tablet 1   progesterone 200 MG SUPP Place 200 mg vaginally at bedtime.     tamsulosin (FLOMAX) 0.4 MG CAPS capsule Take 1 capsule (0.4 mg total) by mouth daily. 30 capsule 3   valsartan (DIOVAN) 320 MG tablet Take 1 tablet (320 mg total) by mouth daily. 90 tablet 3   No current facility-administered medications for this visit.    PHYSICAL EXAMINATION: ECOG PERFORMANCE STATUS: 0 - Asymptomatic  Vitals:   01/19/22 1342  BP: 134/77  Pulse: 82  Resp: 17  Temp: 98.7 F (37.1 C)  SpO2: 100%   Wt Readings from Last 3 Encounters:  01/19/22 254 lb (115.2 kg)  01/02/22 241 lb (109.3 kg)  12/23/21 246 lb (111.6 kg)     GENERAL:alert, no distress and comfortable SKIN: skin color, texture, turgor are normal, no rashes or  significant lesions EYES: normal, Conjunctiva are pink and non-injected, sclera clear  NECK: supple, thyroid normal size, non-tender, without nodularity LYMPH:  no palpable lymphadenopathy in the cervical, axillary  LUNGS: clear to auscultation and percussion with normal breathing effort HEART: regular rate & rhythm and no murmurs and no lower extremity edema ABDOMEN:abdomen soft, non-tender and normal bowel sounds Musculoskeletal:no cyanosis of digits and no clubbing  NEURO: alert & oriented x 3 with fluent speech, no focal motor/sensory deficits  LABORATORY DATA:  I have reviewed the data as listed    Latest Ref Rng & Units 01/19/2022    1:03 PM 10/19/2021    1:09 PM 10/02/2021   10:32 AM  CBC  WBC 4.0 - 10.5 K/uL 8.7  8.1  11.1   Hemoglobin 13.0 - 17.0 g/dL 12.0  11.2  10.9   Hematocrit 39.0 - 52.0 % 35.5  33.1  32.6   Platelets 150 - 400 K/uL 319  333  376         Latest Ref Rng & Units 01/19/2022    1:03 PM 10/19/2021    1:09 PM 10/02/2021   10:32 AM  CMP  Glucose 70 - 99 mg/dL 164  174  120   BUN 6 - 20 mg/dL '21  24  30   '$ Creatinine 0.61 - 1.24 mg/dL 1.19  1.30  1.83   Sodium 135 - 145 mmol/L 137  139  131   Potassium 3.5 - 5.1 mmol/L 3.7  3.8  3.4   Chloride 98 - 111 mmol/L 100  102  94   CO2 22 - 32 mmol/L '27  28  24   '$ Calcium 8.9 - 10.3 mg/dL 9.7  9.4  8.8   Total Protein 6.5 - 8.1 g/dL 7.4  7.5  7.8   Total Bilirubin 0.3 - 1.2 mg/dL 0.3  0.4  0.8   Alkaline Phos 38 - 126 U/L 66  91  98   AST 15 - 41 U/L '11  13  24   '$ ALT 0 - 44 U/L '13  13  18       '$ RADIOGRAPHIC STUDIES: I have personally reviewed the radiological images as listed and agreed with the findings in the report. No results found.    Orders Placed This Encounter  Procedures   CT CHEST ABDOMEN PELVIS W CONTRAST    Standing Status:   Future    Standing  Expiration Date:   01/20/2023    Order Specific Question:   Is patient pregnant?    Answer:   No    Order Specific Question:   Preferred imaging  location?    Answer:   [facility]    Order Specific Question:   Release to patient    Answer:   Immediate    Order Specific Question:   Is Oral Contrast requested for this exam?    Answer:   Yes, Per Radiology protocol   All questions were answered. The patient knows to call the clinic with any problems, questions or concerns. No barriers to learning was detected.      Truitt Merle, MD 01/19/2022   I, Wilburn Mylar, am acting as scribe for Truitt Merle, MD.   I have reviewed the above documentation for accuracy and completeness, and I agree with the above.

## 2022-01-24 LAB — CHROMOGRANIN A: Chromogranin A (ng/mL): 52.2 ng/mL (ref 0.0–101.8)

## 2022-01-25 ENCOUNTER — Other Ambulatory Visit: Payer: 59

## 2022-01-25 ENCOUNTER — Ambulatory Visit: Payer: 59 | Admitting: Hematology

## 2022-02-01 ENCOUNTER — Ambulatory Visit: Payer: 59 | Admitting: Hematology

## 2022-02-01 ENCOUNTER — Other Ambulatory Visit: Payer: 59

## 2022-02-13 ENCOUNTER — Ambulatory Visit (INDEPENDENT_AMBULATORY_CARE_PROVIDER_SITE_OTHER): Payer: 59 | Admitting: Family Medicine

## 2022-02-13 ENCOUNTER — Encounter: Payer: Self-pay | Admitting: Family Medicine

## 2022-02-13 VITALS — BP 110/82 | HR 72 | Temp 97.2°F | Ht 69.0 in | Wt 254.2 lb

## 2022-02-13 DIAGNOSIS — Z23 Encounter for immunization: Secondary | ICD-10-CM

## 2022-02-13 DIAGNOSIS — D3A011 Benign carcinoid tumor of the jejunum: Secondary | ICD-10-CM

## 2022-02-13 DIAGNOSIS — I10 Essential (primary) hypertension: Secondary | ICD-10-CM | POA: Diagnosis not present

## 2022-02-13 NOTE — Progress Notes (Signed)
[facility] [address] [name] [address] Dept: [phone] Dept Fax: [phone]  Chronic Care Office Visit  Subjective:    Patient ID: Dwayne Sawyer, adult    DOB: 1971-02-10, 51 y.o..   MRN: [id number]  Chief Complaint  Patient presents with   Follow-up    3 mo follow up.  No questions/ concerns. Interested in second shingles shot.     History of Present Illness:  Patient is in today for reassessment of chronic medical issues.  Mr. Diffley  has a history of resistant hypertension. He is currently managed on amlodipine 10 mg daily, carvedilol 25 mg bid, valsartan 320 mg daily and chlorthalidone 25 mg daily.    Mr. Berrie has a history of hyperlipidemia. He is  managed on atorvastatin 40 mg daily.   Mr. Galli has a history of prediabetes. He is managed on metformin 500 mg daily. He has had an elevated A1c in the diabetes range, but no 2nd value to meet diagnostic criteria.  Mr. Urenda has a history of metastatic carcinoid tumors involving the small intestine and liver. His follow-up PET scan showed remission of his carcinoids. He is feeling well overall and will continue to follow with Dr. Burr Medico.   Past Medical History: Patient Active Problem List   Diagnosis Date Noted   Morbid obesity, associated with hypertension, hyperlipidemia, prediabetes, and osteoarthritis ([place]) 09/15/2021   Status post total right knee replacement 09/07/2021   BMI 37.0-37.9, adult 12/10/2020   Obstructive sleep apnea syndrome, severe 11/29/2020   Hyperlipidemia 09/06/2020   Resistant hypertension 09/06/2020   Pre-diabetes 09/06/2020   Carcinoid tumor of small intestine 06/13/2019   Bowel obstruction ([place]) 06/11/2019   Rheumatoid arthritis involving multiple sites with positive rheumatoid factor ([place]) 10/13/2015   Past Surgical History:  Procedure Laterality Date   EYE SURGERY Right    pt says we he was a baby surgery had to be done  on one eye "because it wasn't as strong as the other"   [place]   inguinal- pt doesn't remember which side   LAPAROTOMY N/A 06/12/2019   Procedure: EXPLORATORY LAPAROTOMY WITH SMALL BOWEL RESECTION;  Surgeon: Johnathan Hausen, MD;  Location: [facility];  Service: General;  Laterality: N/A;   SHOULDER SURGERY Right 2001   rotator cuff repair   SMALL INTESTINE SURGERY  06/12/2019   part of ex lap procedure. pt states "3 feet of intestine was removed"   TONSILLECTOMY     as a child   TOTAL KNEE ARTHROPLASTY Right 09/07/2021   Procedure: RIGHT TOTAL KNEE ARTHROPLASTY;  Surgeon: Leandrew Koyanagi, MD;  Location: [place];  Service: Orthopedics;  Laterality: Right;   Family History  Problem Relation Age of Onset   Cancer Father        widespread carcinoid cancer   Hypertension Mother    Hyperlipidemia Mother    Outpatient Medications Prior to Visit  Medication Sig Dispense Refill   acetaminophen (TYLENOL) 325 MG tablet Take 2 tablets (650 mg total) by mouth every 6 (six) hours as needed. 36 tablet 0   amLODipine (NORVASC) 10 MG tablet TAKE ONE TABLET BY MOUTH DAILY 90 tablet 1   aspirin EC 81 MG tablet Take 1 tablet (81 mg total) by mouth 2 (two) times daily. To be taken after surgery 84 tablet 0   atorvastatin (LIPITOR) 40 MG tablet TAKE ONE TABLET BY MOUTH DAILY AT 6:00 IN THE EVENING 90 tablet 3   carvedilol (COREG) 25  MG tablet Take 1 tablet (25 mg total) by mouth 2 (two) times daily with a meal. 180 tablet 3   chlorthalidone (HYGROTON) 25 MG tablet Take 1 tablet (25 mg total) by mouth daily. 90 tablet 3   diclofenac (VOLTAREN) 75 MG EC tablet Take 1 tablet (75 mg total) by mouth 2 (two) times daily as needed. 60 tablet 2   docusate sodium (COLACE) 100 MG capsule Take 1 capsule (100 mg total) by mouth daily as needed. 30 capsule 2   estrogens, conjugated, (PREMARIN) 0.3 MG tablet Take 0.3-0.6 mg by mouth See admin instructions. Take 0.6 mg by mouth in the morning and 0.3 mg in the evening      metFORMIN (GLUCOPHAGE) 500 MG tablet TAKE ONE TABLET BY MOUTH EVERY MORNING WITH BREAKFAST 90 tablet 1   progesterone 200 MG SUPP Place 200 mg vaginally at bedtime.     tamsulosin (FLOMAX) 0.4 MG CAPS capsule Take 1 capsule (0.4 mg total) by mouth daily. 30 capsule 3   valsartan (DIOVAN) 320 MG tablet Take 1 tablet (320 mg total) by mouth daily. 90 tablet 3   No facility-administered medications prior to visit.   No Known Allergies    Objective:   Today's Vitals   02/13/22 1358  BP: 110/82  Pulse: 72  Temp: (!) 97.2 F (36.2 C)  TempSrc: Temporal  SpO2: 95%  Weight: 254 lb 3.2 oz (115.3 kg)  Height: '5\' 9"'$  (1.753 m)   Body mass index is 37.54 kg/m.   General: Well developed, well nourished. No acute distress. Psych: Alert and oriented. Normal mood and affect.  Health Maintenance Due  Topic Date Due   Hepatitis C Screening  Never done   TETANUS/TDAP  Never done   Zoster Vaccines- Shingrix (2 of 2) 01/09/2022     Assessment & Plan:   1. Resistant hypertension Blood pressure is in good control. Continue 4-drug therapy with amlodipine 10 mg daily, carvedilol 25 mg bid, valsartan 320 mg daily and chlorthalidone 25 mg daily  2. Carcinoid tumor of jejunum, unspecified whether malignant In remission. Continue to follow with Dr. Burr Medico.  3. Need for shingles vaccine  - Varicella-zoster vaccine IM  Return in about 3 months (around 05/16/2022) for Reassessment.   [name], MD

## 2022-03-01 ENCOUNTER — Other Ambulatory Visit: Payer: Self-pay | Admitting: Family Medicine

## 2022-03-01 ENCOUNTER — Ambulatory Visit: Payer: 59 | Admitting: Physician Assistant

## 2022-03-01 DIAGNOSIS — I10 Essential (primary) hypertension: Secondary | ICD-10-CM

## 2022-03-01 DIAGNOSIS — Z96651 Presence of right artificial knee joint: Secondary | ICD-10-CM

## 2022-03-01 NOTE — Progress Notes (Unsigned)
Post-Op Visit Note   Patient: Dwayne Sawyer           Date of Birth: May 14, 1971           MRN: [id number] Visit Date: 03/01/2022 PCP: [name], MD   Assessment & Plan:  Chief Complaint:  Chief Complaint  Patient presents with   Right Knee - Follow-up   Visit Diagnoses:  1. Status post total right knee replacement     Plan:  Patient is a pleasant 51 year old gentleman who comes in today 6 months status post right total knee replacement 09/07/2021.  Patient has been doing well..  Examination of the right knee reveals a fully healed surgical incision.  Range of motion 0 to 125 degrees.  Stable to valgus varus stress.  Neurovascular intact distally.  At this point, patient will advance with activity as tolerated.  Dental prophylaxis reinforced.  Follow-up in 6 months for repeat evaluation and 2 view x-rays of the right knee.  Call with concerns or questions in the meantime.  Follow-Up Instructions: Return in about 6 months (around 09/01/2022).   Orders:  No orders of the defined types were placed in this encounter.  No orders of the defined types were placed in this encounter.   Imaging: No new imaging  PMFS History: Patient Active Problem List   Diagnosis Date Noted   Morbid obesity, associated with hypertension, hyperlipidemia, prediabetes, and osteoarthritis ([place]) 09/15/2021   Status post total right knee replacement 09/07/2021   BMI 37.0-37.9, adult 12/10/2020   Obstructive sleep apnea syndrome, severe 11/29/2020   Hyperlipidemia 09/06/2020   Resistant hypertension 09/06/2020   Pre-diabetes 09/06/2020   Carcinoid tumor of small intestine 06/13/2019   Bowel obstruction ([place]) 06/11/2019   Rheumatoid arthritis involving multiple sites with positive rheumatoid factor ([place]) 10/13/2015   Past Medical History:  Diagnosis Date   Arthritis    Cancer ([place]) 2020   liver   Diabetes mellitus without complication ([place])    type 2- pt was told he was pre-diabetic   Family  history of carcinoid tumor    High cholesterol    Hypertension     Family History  Problem Relation Age of Onset   Cancer Father        widespread carcinoid cancer   Hypertension Mother    Hyperlipidemia Mother     Past Surgical History:  Procedure Laterality Date   EYE SURGERY Right    pt says we he was a baby surgery had to be done on one eye "because it wasn't as strong as the other"   [place]   inguinal- pt doesn't remember which side   LAPAROTOMY N/A 06/12/2019   Procedure: EXPLORATORY LAPAROTOMY WITH SMALL BOWEL RESECTION;  Surgeon: Johnathan Hausen, MD;  [facility];  Service: General;  Laterality: N/A;   SHOULDER SURGERY Right 2001   rotator cuff repair   SMALL INTESTINE SURGERY  06/12/2019   part of ex lap procedure. pt states "3 feet of intestine was removed"   TONSILLECTOMY     as a child   TOTAL KNEE ARTHROPLASTY Right 09/07/2021   Procedure: RIGHT TOTAL KNEE ARTHROPLASTY;  Surgeon: Leandrew Koyanagi, MD;  Location: [facility];  Service: Orthopedics;  Laterality: Right;   Social History   Occupational History   Not on file  Tobacco Use   Smoking status: Never   Smokeless tobacco: Former    Types: Chew    Quit date: 2020  Vaping Use   Vaping Use:  Never used  Substance and Sexual Activity   Alcohol use: Yes    Comment: very occasionally (less than a 6 pack of beer a year)   Drug use: Never   Sexual activity: Not on file

## 2022-03-05 ENCOUNTER — Other Ambulatory Visit: Payer: Self-pay | Admitting: Family Medicine

## 2022-03-05 DIAGNOSIS — I10 Essential (primary) hypertension: Secondary | ICD-10-CM

## 2022-03-24 ENCOUNTER — Encounter (HOSPITAL_BASED_OUTPATIENT_CLINIC_OR_DEPARTMENT_OTHER): Payer: Self-pay | Admitting: Cardiovascular Disease

## 2022-03-24 ENCOUNTER — Encounter: Payer: Self-pay | Admitting: Oncology

## 2022-03-24 ENCOUNTER — Telehealth: Payer: Self-pay | Admitting: *Deleted

## 2022-03-24 ENCOUNTER — Ambulatory Visit (HOSPITAL_BASED_OUTPATIENT_CLINIC_OR_DEPARTMENT_OTHER): Payer: Commercial Managed Care - HMO | Admitting: Cardiovascular Disease

## 2022-03-24 ENCOUNTER — Telehealth: Payer: Self-pay | Admitting: Cardiovascular Disease

## 2022-03-24 ENCOUNTER — Telehealth (HOSPITAL_BASED_OUTPATIENT_CLINIC_OR_DEPARTMENT_OTHER): Payer: Self-pay | Admitting: *Deleted

## 2022-03-24 VITALS — BP 116/76 | HR 72 | Ht 69.0 in | Wt 252.0 lb

## 2022-03-24 DIAGNOSIS — I10 Essential (primary) hypertension: Secondary | ICD-10-CM

## 2022-03-24 DIAGNOSIS — G4733 Obstructive sleep apnea (adult) (pediatric): Secondary | ICD-10-CM | POA: Diagnosis not present

## 2022-03-24 DIAGNOSIS — E785 Hyperlipidemia, unspecified: Secondary | ICD-10-CM

## 2022-03-24 MED ORDER — OZEMPIC (0.25 OR 0.5 MG/DOSE) 2 MG/1.5ML ~~LOC~~ SOPN: PEN_INJECTOR | SUBCUTANEOUS | 1 refills | Status: DC

## 2022-03-24 MED ORDER — WEGOVY 0.25 MG/0.5ML ~~LOC~~ SOAJ
0.2500 mg | SUBCUTANEOUS | 0 refills | Status: DC
Start: 2022-03-24 — End: 2022-11-21

## 2022-03-24 MED ORDER — OZEMPIC (1 MG/DOSE) 4 MG/3ML ~~LOC~~ SOPN
PEN_INJECTOR | SUBCUTANEOUS | 5 refills | Status: DC
Start: 2022-03-24 — End: 2022-04-19

## 2022-03-24 MED ORDER — OZEMPIC (1 MG/DOSE) 4 MG/3ML ~~LOC~~ SOPN: PEN_INJECTOR | SUBCUTANEOUS | 5 refills | Status: DC

## 2022-03-24 NOTE — Telephone Encounter (Signed)
*  STAT* If patient is at the pharmacy, call can be transferred to refill team.   1. Which medications need to be refilled? (please list name of each medication and dose if known)  Semaglutide,0.25 or 0.'5MG'$ /DOS, (OZEMPIC, 0.25 OR 0.5 MG/DOSE,) 2 MG/1.5ML SOPN  2. Which pharmacy/location (including street and city if local pharmacy) is medication to be sent to? [facility] on [address].   3. Do they need a 30 day or 90 day supply? 30 day  Patient has new insurance and old pharmacy doesn't take his new insurance.

## 2022-03-24 NOTE — Patient Instructions (Signed)
Medication Instructions:  START OZEMPIC  INJECT 0.25 MG WEEKLY FOR 4 WEEKS  INCREASE TO 0.5 MG WEEKLY FOR 4 WEEKS INCREASE TO 1 MG WEEKLY FOR 4 WEEKS  *If you need a refill on your cardiac medications before your next appointment, please call your pharmacy*  Lab Work: NONE  Testing/Procedures: NONE  Follow-Up: At [facility], you and your health needs are our priority.  As part of our continuing mission to provide you with exceptional heart care, we have created designated Provider Care Teams.  These Care Teams include your primary Cardiologist (physician) and Advanced Practice Providers (APPs -  Physician Assistants and Nurse Practitioners) who all work together to provide you with the care you need, when you need it.  We recommend signing up for the patient portal called "MyChart".  Sign up information is provided on this After Visit Summary.  MyChart is used to connect with patients for Virtual Visits (Telemedicine).  Patients are able to view lab/test results, encounter notes, upcoming appointments, etc.  Non-urgent messages can be sent to your provider as well.   To learn more about what you can do with MyChart, go to [email or web address].    Your next appointment:   6 month(s)  The format for your next appointment:   In Person  Provider:   Skeet Latch, MD   Other Instructions HAVE [place]

## 2022-03-24 NOTE — Progress Notes (Signed)
Advanced Hypertension Clinic Follow Up:    Date:  03/24/2022   ID:  Dwayne Sawyer, DOB 1970/09/10, MRN [id number]  PCP:  Dwayne Salter, MD  Cardiologist:  None  Nephrologist:  Referring MD: Dwayne Salter, MD   CC: Hypertension  History of Present Illness:    Dwayne Sawyer is a 51 y.o. adult with a hx of carcinoid tumor, hypertension, hyperlipidemia, OSA, atherosclerosis of the aorta, and RA here for follow up.  He first established care in the hypertension clinic 09/2020.  He was first diagnosed with hypertension 15 or 20 years prior.  Initially his blood pressure was fairly easy to control.  However in the last few years it has been more difficult.  He thinks that this started after he was diagnosed with carcinoid syndrome.  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  He will underwent resection and was treated with lanreotide.  He continues to take this monthly to control his symptoms, though it did not stop tumor progression.  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  He has not noted any association with the timing of his next treatment and his blood pressure.  It was thought that his treatment medications were carcinoid may be contributing to the difficult to control blood pressure.  TSH was checked and within normal limits.  He also had a sleep study and was started on a CPAP machine.  Renal artery Dopplers were unremarkable.  He tried stopping his estrogen/progesterone but there is no difference in his blood pressure so he restarted it.  Lutathera was on hold due to manufacturing issues so he was unable to take this medication his blood pressure remained uncontrolled.  He was started on carvedilol.  He followed up with our pharmacist and his blood pressure was 138/84.  Carvedilol was increased to 25 mg.  He last saw Dwayne Sawyer, [place] on 11/2020.  At that visit his blood pressure was 142/86.   Valsartan/HCTZ was increased.  At his last appointment Dwayne Sawyer was doing well.  He had a knee replacement 08/2021.  He followed up with Dwayne Sawyer remotely on 12/2021 and was doing well.  He reports that he has been engaging in physical therapy for his knee and has started walking and playing golf for exercise. He is working on increasing his cardio and has noticed some weight gain. He experiences soreness after exercise but denies any cardio issues. The patient's breathing is reportedly good while awake, but he is unsure about his breathing during sleep. He has not received a CPAP machine despite having a sleep study done 12/2020.  The patient's last A1C was 7.3, and he is currently taking Metformin for diabetes management. He has not been checking blood sugars at home. He has an upcoming appointment with his primary care provider in October."  BP has been well-controlled at home.  Previous antihypertensives: N/A   Past Medical History:  Diagnosis Date   Arthritis    Cancer ([place]) 2020   liver   Diabetes mellitus without complication ([place])    type 2- pt was told he was pre-diabetic   Family history of carcinoid tumor    High cholesterol    Hypertension     Past Surgical History:  Procedure Laterality Date   EYE SURGERY Right    pt says we he was a baby surgery had to be done on one eye "because it wasn't as strong as the  other"   HERNIA REPAIR  1988   inguinal- pt doesn't remember which side   LAPAROTOMY N/A 06/12/2019   Procedure: EXPLORATORY LAPAROTOMY WITH SMALL BOWEL RESECTION;  Surgeon: Dwayne Hausen, MD;  Location: [facility];  Service: General;  Laterality: N/A;   SHOULDER SURGERY Right 2001   rotator cuff repair   SMALL INTESTINE SURGERY  06/12/2019   part of ex lap procedure. pt states "3 feet of intestine was removed"   TONSILLECTOMY     as a child   TOTAL KNEE ARTHROPLASTY Right 09/07/2021   Procedure: RIGHT TOTAL KNEE ARTHROPLASTY;  Surgeon: Dwayne Koyanagi, MD;   Location: [facility];  Service: Orthopedics;  Laterality: Right;    Current Medications: Current Meds  Medication Sig   acetaminophen (TYLENOL) 325 MG tablet Take 2 tablets (650 mg total) by mouth every 6 (six) hours as needed.   amLODipine (NORVASC) 10 MG tablet TAKE ONE TABLET BY MOUTH DAILY   aspirin EC 81 MG tablet Take 1 tablet (81 mg total) by mouth 2 (two) times daily. To be taken after surgery   atorvastatin (LIPITOR) 40 MG tablet TAKE ONE TABLET BY MOUTH DAILY AT 6:00 IN THE EVENING   carvedilol (COREG) 25 MG tablet Take 1 tablet (25 mg total) by mouth 2 (two) times daily with a meal.   chlorthalidone (HYGROTON) 25 MG tablet TAKE ONE TABLET BY MOUTH DAILY   diclofenac (VOLTAREN) 75 MG EC tablet Take 1 tablet (75 mg total) by mouth 2 (two) times daily as needed.   docusate sodium (COLACE) 100 MG capsule Take 1 capsule (100 mg total) by mouth daily as needed.   estrogens, conjugated, (PREMARIN) 0.3 MG tablet Take 0.3-0.6 mg by mouth See admin instructions. Take 0.6 mg by mouth in the morning and 0.3 mg in the evening   metFORMIN (GLUCOPHAGE) 500 MG tablet TAKE ONE TABLET BY MOUTH EVERY MORNING WITH BREAKFAST   progesterone 200 MG SUPP Place 200 mg vaginally at bedtime.   Semaglutide, 1 MG/DOSE, (OZEMPIC, 1 MG/DOSE,) 4 MG/3ML SOPN AFTER YOU FINISH FIRST 8 WEEKS INJECT 1 MG WEEKLY   Semaglutide,0.25 or 0.'5MG'$ /DOS, (OZEMPIC, 0.25 OR 0.5 MG/DOSE,) 2 MG/1.5ML SOPN INJECT 0.25 MG WEEKLY FOR 4 WEEKS THEN INCREASE TO 0.5 MG WEEKLY FOR 4 WEEKS   tamsulosin (FLOMAX) 0.4 MG CAPS capsule Take 1 capsule (0.4 mg total) by mouth daily.   valsartan (DIOVAN) 320 MG tablet TAKE ONE TABLET BY MOUTH DAILY     Allergies:   Patient has no known allergies.   Social History   Socioeconomic History   Marital status: Married    Spouse name: Not on file   Number of children: 3   Years of education: Not on file   Highest education level: Not on file  Occupational History   Not on file  Tobacco Use   Smoking  status: Never   Smokeless tobacco: Former    Types: Chew    Quit date: 2020  Vaping Use   Vaping Use: Never used  Substance and Sexual Activity   Alcohol use: Yes    Comment: very occasionally (less than a 6 pack of beer a year)   Drug use: Never   Sexual activity: Not on file  Other Topics Concern   Not on file  Social History Narrative   Not on file   Social Determinants of Health   Financial Resource Strain: [place]  (10/11/2020)   Overall Financial Resource Strain (CARDIA)    Difficulty of Paying Living Expenses:  Not very hard  Food Insecurity: No Food Insecurity (10/11/2020)   Hunger Vital Sign    Worried About Running Out of Food in the Last Year: Never true    Ran Out of Food in the Last Year: Never true  Transportation Needs: No Transportation Needs (10/11/2020)   PRAPARE - Hydrologist (Medical): No    Lack of Transportation (Non-Medical): No  Physical Activity: Sufficiently Active (10/11/2020)   Exercise Vital Sign    Days of Exercise per Week: 5 days    Minutes of Exercise per Session: 60 min  Stress: No Stress Concern Present (10/11/2020)   [place]    Feeling of Stress : Only a little  Social Connections: Not on file     Family History: The patient's family history includes Cancer in his father; Hyperlipidemia in his mother; Hypertension in his mother.  ROS:   Please see the history of present illness.    All other systems reviewed and are negative.  EKGs/Labs/Other Studies Reviewed:    EKG:   03/24/22: Sinus rhythm.  Rate 72 bpm The ekg ordered 10/11/2020 demonstrates sinus rhythm.  Rate 69 bpm.  Recent Labs: 01/19/2022: ALT 13; BUN 21; Creatinine 1.19; Hemoglobin 12.0; Platelet Count 319; Potassium 3.7; Sodium 137   Recent Lipid Panel    Component Value Date/Time   CHOL 135 09/16/2021 1411   TRIG 177 (H) 09/16/2021 1411   HDL 43 09/16/2021 1411   CHOLHDL  3.1 09/16/2021 1411   VLDL 35 09/16/2021 1411   LDLCALC 57 09/16/2021 1411   LDLDIRECT 88.0 09/06/2020 1046    Physical Exam:   VS:  BP 116/76   Pulse 72   Ht '5\' 9"'$  (1.753 m)   Wt 252 lb (114.3 kg)   BMI 37.21 kg/m  , BMI Body mass index is 37.21 kg/m. GENERAL:  Well appearing HEENT: Pupils equal round and reactive, fundi not visualized, oral mucosa unremarkable NECK:  No jugular venous distention, waveform within normal limits, carotid upstroke brisk and symmetric, no bruits, no thyromegaly LUNGS:  Clear to auscultation bilaterally HEART:  RRR.  PMI not displaced or sustained,S1 and S2 within normal limits, no S3, no S4, no clicks, no rubs, no murmurs ABD:  Flat, positive bowel sounds normal in frequency in pitch, no bruits, no rebound, no guarding, no midline pulsatile mass, no hepatomegaly, no splenomegaly EXT:  2 plus pulses throughout, no edema, no cyanosis no clubbing SKIN:  No rashes no nodules NEURO:  Cranial nerves II through XII grossly intact, motor grossly intact throughout PSYCH:  Cognitively intact, oriented to person place and time   ASSESSMENT:    1. Resistant hypertension   2. Obstructive sleep apnea syndrome, severe   3. Hyperlipidemia, unspecified hyperlipidemia type   4. Morbid obesity, associated with hypertension, hyperlipidemia, prediabetes, and osteoarthritis ([place])       PLAN:    No problem-specific Assessment & Plan notes found for this encounter. #  Type 2 Diabetes Mellitus - A1C: 7.3 Plan:   a. Continue Metformin as prescribed   b. Initiate Ozempic: 0.25 mg weekly for 4 weeks, then 0.5 mg weekly for 4 weeks, and then 1 mg weekly thereafter   c. Follow up with primary care physician in October   d. Follow up with Cardiology in 6 months or sooner if needed  # Obesity Plan:   a. Encourage regular exercise and healthy diet   b. Initiate Ozempic for weight loss (  as mentioned in the diabetes plan)  # Sleep Apnea Plan:   a. Contact sleep team  to follow up on CPAP machine status   b. Ensure proper communication with the new insurance company for CPAP coverage.  He may need a new sleep study  # Hypertension - Blood pressure slightly elevated during the visit but better on repeat Plan:   a. Continue valsartan, chlorthalidone, amlodipine, and carvedilol   b. Monitor blood pressure at home and report any significant changes to the primary care physician or Cardiology  # Hyperlipidemia - Cholesterol levels within target range Plan:   a. Continue Atorvastatin   b. Encourage a heart-healthy diet and regular exercise   Disposition:    FU with Dwayne Cornia C. Oval Linsey, MD, [facility] in 6 months   Medication Adjustments/Labs and Tests Ordered: Current medicines are reviewed at length with the patient today.  Concerns regarding medicines are outlined above.  Orders Placed This Encounter  Procedures   EKG 12-Lead    Meds ordered this encounter  Medications   Semaglutide,0.25 or 0.'5MG'$ /DOS, (OZEMPIC, 0.25 OR 0.5 MG/DOSE,) 2 MG/1.5ML SOPN    Sig: INJECT 0.25 MG WEEKLY FOR 4 WEEKS THEN INCREASE TO 0.5 MG WEEKLY FOR 4 WEEKS    Dispense:  1 mL    Refill:  1   Semaglutide, 1 MG/DOSE, (OZEMPIC, 1 MG/DOSE,) 4 MG/3ML SOPN    Sig: AFTER YOU FINISH FIRST 8 WEEKS INJECT 1 MG WEEKLY    Dispense:  1 mL    Refill:  5      Signed, Dwayne Latch, MD  03/24/2022 10:41 AM    [place]

## 2022-03-24 NOTE — Telephone Encounter (Signed)
-----   Message from Earvin Hansen, LPN sent at 09/25/6699  9:56 AM EDT ----- Good morning  Can you reach out to this patient regarding his CPAP. Never got a call from [facility] and didn't call you even though you told him to [facility] you are doing well and happy Friday   [facility]

## 2022-03-24 NOTE — Telephone Encounter (Signed)
Received: Today Freada Bergeron, CMA  Earvin Hansen, LPN Just talked to [place]) and she said he needed an updated appointment. I told her he saw Dr Oval Linsey today and she made mention that he is still waiting for his cpap and that is what they needed.  I updated his order and she said she will call him to get him setup. Thanks

## 2022-03-24 NOTE — Telephone Encounter (Signed)
Sent as requested, patient aware

## 2022-03-24 NOTE — Telephone Encounter (Signed)
Ozempic refill

## 2022-03-24 NOTE — Telephone Encounter (Addendum)
Spoke with patient regarding Ozempic not covered Spoke with pharmacy and they thought [facility] Parsons would go through, Rx sent to try.  Didn't go, will try to obtain PA

## 2022-03-30 NOTE — Telephone Encounter (Signed)
Pt is returning call to get update on this medication. Requesting call back.

## 2022-04-03 NOTE — Telephone Encounter (Signed)
Hey who did you reach out to regarding CPAP?

## 2022-04-13 ENCOUNTER — Other Ambulatory Visit: Payer: Self-pay

## 2022-04-13 DIAGNOSIS — C7A011 Malignant carcinoid tumor of the jejunum: Secondary | ICD-10-CM

## 2022-04-13 DIAGNOSIS — D3A011 Benign carcinoid tumor of the jejunum: Secondary | ICD-10-CM

## 2022-04-13 DIAGNOSIS — D3A098 Benign carcinoid tumors of other sites: Secondary | ICD-10-CM

## 2022-04-13 NOTE — Progress Notes (Signed)
Notified through Secure Chat from [place] on [employer] that the [employer] will only authorize for the pt to have the CT CAP w/contrast at [facility].  New order placed for the authorized location and Epic faxed to [facility].  Pt's demographics was sent as well.  Epic fax confirmation received.  Spoke with pt via telephone to let him know that his CT Scan appt was cancelled and why it was cancelled.  Informed pt that a new order was sent to [facility] and they will be contacting him to get his CT Scan scheduled.  Instructed pt to contact [facility] [phone]) if he has not heard from them within the next 3 business day.  Pt verbalized understanding.

## 2022-04-14 ENCOUNTER — Encounter: Payer: Self-pay | Admitting: Oncology

## 2022-04-15 ENCOUNTER — Other Ambulatory Visit: Payer: Self-pay | Admitting: Family Medicine

## 2022-04-15 DIAGNOSIS — R7303 Prediabetes: Secondary | ICD-10-CM

## 2022-04-18 ENCOUNTER — Inpatient Hospital Stay: Payer: 59 | Attending: Hematology

## 2022-04-18 ENCOUNTER — Ambulatory Visit (HOSPITAL_COMMUNITY): Payer: Commercial Managed Care - HMO

## 2022-04-18 ENCOUNTER — Ambulatory Visit
Admission: RE | Admit: 2022-04-18 | Discharge: 2022-04-18 | Disposition: A | Discharge status: Home / Self Care | Payer: Commercial Managed Care - HMO | Source: Ambulatory Visit | Attending: Hematology | Admitting: Hematology

## 2022-04-18 DIAGNOSIS — R197 Diarrhea, unspecified: Secondary | ICD-10-CM | POA: Insufficient documentation

## 2022-04-18 DIAGNOSIS — C7B8 Other secondary neuroendocrine tumors: Secondary | ICD-10-CM | POA: Insufficient documentation

## 2022-04-18 DIAGNOSIS — C7A011 Malignant carcinoid tumor of the jejunum: Secondary | ICD-10-CM

## 2022-04-18 DIAGNOSIS — C7A8 Other malignant neuroendocrine tumors: Secondary | ICD-10-CM | POA: Insufficient documentation

## 2022-04-18 DIAGNOSIS — D3A011 Benign carcinoid tumor of the jejunum: Secondary | ICD-10-CM

## 2022-04-18 DIAGNOSIS — D3A098 Benign carcinoid tumors of other sites: Secondary | ICD-10-CM

## 2022-04-18 LAB — CBC WITH DIFFERENTIAL (CANCER CENTER ONLY)
Abs Immature Granulocytes: 0.02 10*3/uL (ref 0.00–0.07)
Basophils Absolute: 0.1 10*3/uL (ref 0.0–0.1)
Basophils Relative: 1 %
Eosinophils Absolute: 0.3 10*3/uL (ref 0.0–0.5)
Eosinophils Relative: 3 %
HCT: 34.6 % — ABNORMAL LOW (ref 39.0–52.0)
Hemoglobin: 11.7 g/dL — ABNORMAL LOW (ref 13.0–17.0)
Immature Granulocytes: 0 %
Lymphocytes Relative: 13 %
Lymphs Abs: 1.1 10*3/uL (ref 0.7–4.0)
MCH: 29.8 pg (ref 26.0–34.0)
MCHC: 33.8 g/dL (ref 30.0–36.0)
MCV: 88 fL (ref 80.0–100.0)
Monocytes Absolute: 0.7 10*3/uL (ref 0.1–1.0)
Monocytes Relative: 8 %
Neutro Abs: 6.1 10*3/uL (ref 1.7–7.7)
Neutrophils Relative %: 75 %
Platelet Count: 309 10*3/uL (ref 150–400)
RBC: 3.93 MIL/uL — ABNORMAL LOW (ref 4.22–5.81)
RDW: 12.7 % (ref 11.5–15.5)
WBC Count: 8.2 10*3/uL (ref 4.0–10.5)
nRBC: 0 % (ref 0.0–0.2)

## 2022-04-18 LAB — CMP (CANCER CENTER ONLY)
ALT: 11 U/L (ref 0–44)
AST: 10 U/L — ABNORMAL LOW (ref 15–41)
Albumin: 4 g/dL (ref 3.5–5.0)
Alkaline Phosphatase: 66 U/L (ref 38–126)
Anion gap: 9 (ref 5–15)
BUN: 24 mg/dL — ABNORMAL HIGH (ref 6–20)
CO2: 30 mmol/L (ref 22–32)
Calcium: 9.2 mg/dL (ref 8.9–10.3)
Chloride: 100 mmol/L (ref 98–111)
Creatinine: 1.33 mg/dL — ABNORMAL HIGH (ref 0.61–1.24)
GFR, Estimated: >60 mL/min (ref >60)
Glucose, Bld: 126 mg/dL — ABNORMAL HIGH (ref 70–99)
Potassium: 3.5 mmol/L (ref 3.5–5.1)
Sodium: 139 mmol/L (ref 135–145)
Total Bilirubin: 0.3 mg/dL (ref 0.3–1.2)
Total Protein: 7.3 g/dL (ref 6.5–8.1)

## 2022-04-18 MED ORDER — IOPAMIDOL (ISOVUE-300) INJECTION 61%
100.0000 mL | Freq: Once | INTRAVENOUS | Status: AC | PRN
  Administered 2022-04-18: 100 mL via INTRAVENOUS

## 2022-04-19 ENCOUNTER — Encounter: Payer: Self-pay | Admitting: Family Medicine

## 2022-04-19 DIAGNOSIS — R7303 Prediabetes: Secondary | ICD-10-CM

## 2022-04-19 DIAGNOSIS — N200 Calculus of kidney: Secondary | ICD-10-CM

## 2022-04-19 DIAGNOSIS — I10 Essential (primary) hypertension: Secondary | ICD-10-CM

## 2022-04-19 DIAGNOSIS — E782 Mixed hyperlipidemia: Secondary | ICD-10-CM

## 2022-04-19 DIAGNOSIS — M1711 Unilateral primary osteoarthritis, right knee: Secondary | ICD-10-CM

## 2022-04-19 LAB — CHROMOGRANIN A: Chromogranin A (ng/mL): 79.8 ng/mL (ref 0.0–101.8)

## 2022-04-19 MED ORDER — ATORVASTATIN CALCIUM 40 MG PO TABS: 40.0000 mg | ORAL_TABLET | Freq: Every day | ORAL | 3 refills | Status: DC

## 2022-04-19 MED ORDER — VALSARTAN 320 MG PO TABS: 320.0000 mg | ORAL_TABLET | Freq: Every day | ORAL | 3 refills | Status: DC

## 2022-04-19 MED ORDER — CARVEDILOL 25 MG PO TABS: 25.0000 mg | ORAL_TABLET | Freq: Two times a day (BID) | ORAL | 3 refills | Status: DC

## 2022-04-19 MED ORDER — METFORMIN HCL 500 MG PO TABS: 500.0000 mg | ORAL_TABLET | Freq: Every day | ORAL | 3 refills | Status: DC

## 2022-04-19 MED ORDER — DICLOFENAC SODIUM 75 MG PO TBEC: 75.0000 mg | DELAYED_RELEASE_TABLET | Freq: Two times a day (BID) | ORAL | 2 refills | Status: DC | PRN

## 2022-04-19 MED ORDER — CHLORTHALIDONE 25 MG PO TABS: 25.0000 mg | ORAL_TABLET | Freq: Every day | ORAL | 3 refills | Status: DC

## 2022-04-19 MED ORDER — AMLODIPINE BESYLATE 10 MG PO TABS: 10.0000 mg | ORAL_TABLET | Freq: Every day | ORAL | 3 refills | Status: DC

## 2022-04-19 MED ORDER — TAMSULOSIN HCL 0.4 MG PO CAPS: 0.4000 mg | ORAL_CAPSULE | Freq: Every day | ORAL | 3 refills | Status: DC

## 2022-04-21 ENCOUNTER — Inpatient Hospital Stay (HOSPITAL_BASED_OUTPATIENT_CLINIC_OR_DEPARTMENT_OTHER): Payer: 59 | Admitting: Hematology

## 2022-04-21 ENCOUNTER — Encounter: Payer: Self-pay | Admitting: Hematology

## 2022-04-21 VITALS — BP 117/75 | HR 80 | Temp 98.4°F | Resp 18 | Ht 69.0 in | Wt 254.8 lb

## 2022-04-21 DIAGNOSIS — C7A8 Other malignant neuroendocrine tumors: Secondary | ICD-10-CM | POA: Diagnosis not present

## 2022-04-21 DIAGNOSIS — D3A011 Benign carcinoid tumor of the jejunum: Secondary | ICD-10-CM

## 2022-04-21 NOTE — Progress Notes (Signed)
[place]   [phone] [phone]   Clinic Follow up Note   Patient Care Team: [name], MD as PCP - General (Family Medicine) Gregor Hams, MD as Consulting Physician (Sports Medicine) Truitt Merle, MD as Consulting Physician (Hematology) Leandrew Koyanagi, MD as Attending Physician (Orthopedic Surgery) Skeet Latch, MD as Attending Physician (Cardiology) [place] as Attending Physician (Radiology)  Date of Service:  04/21/2022  CHIEF COMPLAINT: f/u of neuroendocrine tumor  CURRENT THERAPY:  Surveillance  ASSESSMENT & PLAN:  Trimaine Maser is a 51 y.o. adult with   1. 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 Pathology confirmed well differentiated neuroendocrine tumor, with metastasis to 14 of 32 LNs. Margins clear.  -07/16/19 PET shows two right hepatic lesion which are intensely hypermetabolic consistent with metastatic NET. There is also a smaller less avid left hepatic lesion that is concerning for metastasis which was not seen on CT from 06/11/19. No other evidence of mets -His MRI abdomen from 08/04/19 shows 3 liver lesions (0.8-4.4cm) compatible with neuroendocrine metastatic lesions.  -He was seen by Dr. Barry Dienes and she did not recommend surgery due to the multiple liver mets.  -He was on monthly Lanreotide injections since 08/01/19. He was found to have progression on 05/31/20 DOTATATE PET scan, and received 4 doses of Lutathera 08/17/20 - 03/17/21. -restaging DOTATATE PET on 04/28/21 showed positive response to treatment. We discontinued his lanreotide injections after 05/13/21. -surveillance CT CAP on 04/18/22 showed stable disease, I reviewed with pt  -he has developed recurrent and worsening diarrhea since his last visit (see #2). Labs from 9/26 reviewed, overall stable.  2.  Diarrhea -secondary to #1, worse lately -we discussed options. He did well on lanreotide injections with no diarrhea. I recommend he try imodium first, then if the diarrhea persists, we can try lomotil or restart lanreotide injections. I will go ahead and schedule injections, which he can cancel if diarrhea is controlled.     PLAN: -restart lanreotide injections in 4 weeks if his diarrhea is not controlled by imodium  -f/u in 12 weeks   No problem-specific Assessment & Plan notes found for this encounter.   SUMMARY OF ONCOLOGIC HISTORY: Oncology History Overview Note  Cancer Staging No matching staging information was found for the patient.    Carcinoid tumor of small intestine  06/11/2019 Imaging   US Abdomen 06/11/19  IMPRESSION: 1. There is a solid mass in the right lobe of the liver which based on current measurements may have enlarged since the prior study from 2015. Images from the previous MR at that time cannot be retrieved. Given this circumstance, it may be prudent to correlate with pre and serial post-contrast MR or CT of the liver to further evaluate. There is underlying diffuse increase in liver echogenicity, a finding indicative of hepatic steatosis.   2. Multiple loops of fluid-filled bowel. Question a degree of ileus or enteritis.   3. Study otherwise unremarkable. Note that much of the common bile duct is obscured by gas.    06/11/2019 Imaging   CT AP W Contrast 06/11/19   IMPRESSION: 1. High-grade small bowel obstruction secondary to desmoplastic response in the central right abdominal mesentery centered on a 4 cm calcified irregular/spiculated soft tissue lesion. Abrupt small bowel transition zone is identified immediately lead adjacent to this mesenteric lesion and multiple adjacent bowel  loops are tethered into this region with mesenteric edema/congestion. The loop of small bowel immediately proximal to the transition zone shows mild  circumferential wall thickening but no pneumatosis. 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 2. Small lymph nodes in the abnormal right mesentery suggest additional metastatic involvement. 3. Heterogeneous liver parenchyma, likely secondary to geographic fatty deposition. There is a focal 14 mm low-density lesion in the right liver. The patient had an MRI in the [facility] system on 08/09/2013 to evaluate a right liver lesion, but those images are not available. Follow-up MRI without and with contrast recommended to exclude metastatic disease. 4. Tiny sclerotic foci in the T12 vertebral body in both femoral heads are likely benign, but close attention on follow-up recommended. 5.  Aortic Atherosclerois (ICD10-170.0)      06/12/2019 Surgery    EXPLORATORY LAPAROTOMY WITH SMALL BOWEL RESECTION by Dr. 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 Interval resection small bowel and mesenteric mass with no evidence residual mesenteric or small bowel neuroendocrine tumor. 2. Large lesion in RIGHT hepatic lobe with intense radiotracer activity consistent with well differentiated neuroendocrine tumor metastasis. Smaller lesion in the anterior LEFT hepatic lobe is concerning for second hepatic metastasis. 3. Pulmonary nodule measuring 6 mm in LEFT upper lobe is not have radiotracer activity.  Smaller RIGHT upper lobe nodule. Recommend close attention on follow-up.   08/01/2019 - 05/07/2020 Chemotherapy   Lanreotide injection monthly starting 08/01/19. Stopped after 05/07/20 due to disease progression     08/04/2019 Imaging   MRI abdomen  IMPRESSION: 1. The previously noted lesion in the right lobe of the liver has grown slightly compared to 2015. In addition, today's study demonstrates 2 smaller lesions with similar imaging characteristics. Given the activity on the recent PET Dotatate scan, these lesions are all compatible with small neuroendocrine metastatic lesions. 2. Hepatic steatosis.   02/10/2020 Imaging   MRI abdomen  IMPRESSION: 1. The dominant right hepatic lobe mass has mildly increased in size compared to the prior exam. This currently measures 5.4 by 4.5 cm, previously 4.9 by 4.2 cm. 2. Nine additional small T2 hyperintense foci in the liver are stable, and while the small size makes these less specific, there likely small metastatic lesions. Today's exam has the benefit of less motion artifact compared to the 06/03/2020 MRI, and I was able to pick at each of these tiny lesions on the prior exam in retrospect. It is conceivable that 1 or more of these tiny lesions could represent small hemangiomas, although I do not observe classic delayed enhancement pattern. 3. Diffuse hepatic steatosis.   05/31/2020 PET scan   DOTATATE PET  IMPRESSION: 1. Progression of well differentiated neuroendocrine tumor hepatic metastasis with increase in size and radiotracer activity of dominant lesion in the LEFT hepatic lobe and multiple small lesions as described above. 2. New small focus of radiotracer activity within the head of the pancreas. 3. Increased hepatic steatosis.  Chemotherapy   Lutathera treatments on 08/11/20, 10/06/20, 12/01/20, 01/26/21      04/28/2021 PET scan   NETSPOT GA 68 DOTATATE  IMPRESSION: 1. Interval decrease in radiotracer activity of  neuroendocrine tumor hepatic metastasis. Dominant lesion the RIGHT hepatic lobe is mildly in size. Several small peripheral lesions now have no radiotracer activity. Lesions are conspicuous in liver on noncontrast exam due to hepatic steatosis. 2. Small lesion in the head of the pancreas ahs decreased radiotracer activity. Lesion difficult to define on CT portion exam. 3. No evidence new metastatic neuroendocrine tumor. 4. Post RIGHT hemicolectomy anatomy without complication 5. New bilateral symmetric gynecomastia.      INTERVAL HISTORY:  Dwayne Sawyer is here for a follow up of NET. He was last seen by me on 01/19/22. He presents to the clinic alone. He reports he is having a lot of diarrhea lately.   All other systems were reviewed with the patient and are negative.  MEDICAL HISTORY:  Past Medical History:  Diagnosis Date   Arthritis    Cancer ([place]) 2020   liver   Diabetes mellitus without complication ([place])    type 2- pt was told he was pre-diabetic   Family history of carcinoid tumor    High cholesterol    Hypertension     SURGICAL HISTORY: Past Surgical History:  Procedure Laterality Date   EYE SURGERY Right    pt says we he was a baby surgery had to be done on one eye "because it wasn't as strong as the other"   [place]   inguinal- pt doesn't remember which side   LAPAROTOMY N/A 06/12/2019   Procedure: EXPLORATORY LAPAROTOMY WITH SMALL BOWEL RESECTION;  Surgeon: Johnathan Hausen, MD;  Location: [facility];  Service: General;  Laterality: N/A;   SHOULDER SURGERY Right 2001   rotator cuff repair   SMALL INTESTINE SURGERY  06/12/2019   part of ex lap procedure. pt states "3 feet of intestine was removed"   TONSILLECTOMY     as a child   TOTAL KNEE ARTHROPLASTY Right 09/07/2021   Procedure: RIGHT TOTAL KNEE ARTHROPLASTY;  Surgeon: Leandrew Koyanagi, MD;  Location: [facility];  Service: Orthopedics;  Laterality: Right;    I have reviewed the social history and family  history with the patient and they are unchanged from previous note.  ALLERGIES:  has No Known Allergies.  MEDICATIONS:  Current Outpatient Medications  Medication Sig Dispense Refill   amLODipine (NORVASC) 10 MG tablet Take 1 tablet (10 mg total) by mouth daily. 90 tablet 3   aspirin EC 81 MG tablet Take 1 tablet (81 mg total) by mouth 2 (two) times daily. To be taken after surgery 84 tablet 0   atorvastatin (LIPITOR) 40 MG tablet Take 1 tablet (40 mg total) by mouth daily. 90 tablet 3   carvedilol (COREG) 25 MG tablet Take 1 tablet (25 mg total) by mouth 2 (two) times daily with a meal. 180 tablet 3   chlorthalidone (HYGROTON) 25 MG tablet Take 1 tablet (25 mg total) by mouth daily. 90 tablet 3   diclofenac (VOLTAREN) 75 MG EC tablet Take 1 tablet (75 mg total) by mouth 2 (two) times daily as needed. 60 tablet 2   estrogens, conjugated, (PREMARIN) 0.3 MG tablet Take 0.3-0.6 mg by mouth See admin instructions. Take 0.6 mg by mouth in the morning and 0.3 mg in the evening     metFORMIN (GLUCOPHAGE) 500 MG tablet Take 1 tablet (500 mg total)  by mouth daily with breakfast. 90 tablet 3   progesterone 200 MG SUPP Place 200 mg vaginally at bedtime.     Semaglutide-Weight Management (WEGOVY) 0.25 MG/0.5ML SOAJ Inject 0.25 mg into the skin once a week. FOR 4 WEEKS 2 mL 0   tamsulosin (FLOMAX) 0.4 MG CAPS capsule Take 1 capsule (0.4 mg total) by mouth daily. 90 capsule 3   valsartan (DIOVAN) 320 MG tablet Take 1 tablet (320 mg total) by mouth daily. 90 tablet 3   No current facility-administered medications for this visit.    PHYSICAL EXAMINATION: ECOG PERFORMANCE STATUS: 0 - Asymptomatic  Vitals:   04/21/22 1313  BP: 117/75  Pulse: 80  Resp: 18  Temp: 98.4 F (36.9 C)  SpO2: 98%   Wt Readings from Last 3 Encounters:  04/21/22 254 lb 12.8 oz (115.6 kg)  03/24/22 252 lb (114.3 kg)  02/13/22 254 lb 3.2 oz (115.3 kg)     GENERAL:alert, no distress and comfortable SKIN: skin color  normal, no rashes or significant lesions EYES: normal, Conjunctiva are pink and non-injected, sclera clear  NEURO: alert & oriented x 3 with fluent speech  LABORATORY DATA:  I have reviewed the data as listed    Latest Ref Rng & Units 04/18/2022    7:41 AM 01/19/2022    1:03 PM 10/19/2021    1:09 PM  CBC  WBC 4.0 - 10.5 K/uL 8.2  8.7  8.1   Hemoglobin 13.0 - 17.0 g/dL 11.7  12.0  11.2   Hematocrit 39.0 - 52.0 % 34.6  35.5  33.1   Platelets 150 - 400 K/uL 309  319  333         Latest Ref Rng & Units 04/18/2022    7:41 AM 01/19/2022    1:03 PM 10/19/2021    1:09 PM  CMP  Glucose 70 - 99 mg/dL 126  164  174   BUN 6 - 20 mg/dL '24  21  24   '$ Creatinine 0.61 - 1.24 mg/dL 1.33  1.19  1.30   Sodium 135 - 145 mmol/L 139  137  139   Potassium 3.5 - 5.1 mmol/L 3.5  3.7  3.8   Chloride 98 - 111 mmol/L 100  100  102   CO2 22 - 32 mmol/L '30  27  28   '$ Calcium 8.9 - 10.3 mg/dL 9.2  9.7  9.4   Total Protein 6.5 - 8.1 g/dL 7.3  7.4  7.5   Total Bilirubin 0.3 - 1.2 mg/dL 0.3  0.3  0.4   Alkaline Phos 38 - 126 U/L 66  66  91   AST 15 - 41 U/L '10  11  13   '$ ALT 0 - 44 U/L '11  13  13       '$ RADIOGRAPHIC STUDIES: I have personally reviewed the radiological images as listed and agreed with the findings in the report. No results found.    No orders of the defined types were placed in this encounter.  All questions were answered. The patient knows to call the clinic with any problems, questions or concerns. No barriers to learning was detected. The total time spent in the appointment was 30 minutes.     Truitt Merle, MD 04/21/2022   I, Wilburn Mylar, am acting as scribe for Truitt Merle, MD.   I have reviewed the above documentation for accuracy and completeness, and I agree with the above.

## 2022-05-19 ENCOUNTER — Inpatient Hospital Stay: Payer: Commercial Managed Care - HMO | Attending: Hematology

## 2022-05-19 VITALS — BP 129/78 | HR 72 | Temp 98.9°F | Resp 18

## 2022-05-19 DIAGNOSIS — C7A011 Malignant carcinoid tumor of the jejunum: Secondary | ICD-10-CM | POA: Insufficient documentation

## 2022-05-19 DIAGNOSIS — C7B02 Secondary carcinoid tumors of liver: Secondary | ICD-10-CM | POA: Diagnosis not present

## 2022-05-19 DIAGNOSIS — R197 Diarrhea, unspecified: Secondary | ICD-10-CM | POA: Insufficient documentation

## 2022-05-19 DIAGNOSIS — C7A8 Other malignant neuroendocrine tumors: Secondary | ICD-10-CM | POA: Diagnosis present

## 2022-05-19 DIAGNOSIS — D3A011 Benign carcinoid tumor of the jejunum: Secondary | ICD-10-CM

## 2022-05-19 MED ORDER — LANREOTIDE ACETATE 120 MG/0.5ML ~~LOC~~ SOLN
120.0000 mg | Freq: Once | SUBCUTANEOUS | Status: AC
  Administered 2022-05-19: 120 mg via SUBCUTANEOUS
  Filled 2022-05-19: qty 120

## 2022-05-19 NOTE — Patient Instructions (Signed)
Lanreotide Injection What is this medication? LANREOTIDE (lan REE oh tide) treats high levels of growth hormone (acromegaly). It is used when other therapies have not worked well enough or cannot be tolerated. It works by reducing the amount of growth hormone your body makes. This reduces symptoms and the risk of health problems caused by too much growth hormone, such as diabetes and heart disease. It may also be used to treat neuroendocrine tumors, a cancer of the cells that release hormones and other substances in your body. It works by slowing down the release of these substances from the cells. This slows tumor growth. It also decreases the symptoms of carcinoid syndrome, such as flushing or diarrhea. This medicine may be used for other purposes; ask your health care provider or pharmacist if you have questions. COMMON BRAND NAME(S): Somatuline [place] What should I tell my care team before I take this medication? They need to know if you have any of these conditions: Diabetes Gallbladder disease Heart disease Kidney disease Liver disease Thyroid disease An unusual or allergic reaction to lanreotide, other medications, foods, dyes, or preservatives Pregnant or trying to get pregnant Breast-feeding How should I use this medication? This medication is injected under the skin. It is given by your care team in a hospital or clinic setting. Talk to your care team about the use of this medication in children. Special care may be needed. Overdosage: If you think you have taken too much of this medicine contact a poison control center or emergency room at once. NOTE: This medicine is only for you. Do not share this medicine with others. What if I miss a dose? Keep appointments for follow-up doses. It is important not to miss your dose. Call your care team if you are unable to keep an appointment. What may interact with this medication? Bromocriptine Cyclosporine Certain medications for blood  pressure, heart disease, irregular heartbeat Certain medications for diabetes Quinidine Terfenadine This list may not describe all possible interactions. Give your health care provider a list of all the medicines, herbs, non-prescription drugs, or dietary supplements you use. Also tell them if you smoke, drink alcohol, or use illegal drugs. Some items may interact with your medicine. What should I watch for while using this medication? Visit your care team for regular checks on your progress. Tell your care team if your symptoms do not start to get better or if they get worse. Your condition will be monitored carefully while you are receiving this medication. You may need blood work while you are taking this medication. This medication may increase blood sugar. The risk may be higher in patients who already have diabetes. Ask your care team what you can do to lower your risk of diabetes while taking this medication. Talk to your care team if you wish to become pregnant or think you may be pregnant. This medication can cause serious birth defects. Do not breast-feed while taking this medication and for 6 months after stopping therapy. This medication may cause infertility. Talk to your care team if you are concerned about your fertility. What side effects may I notice from receiving this medication? Side effects that you should report to your care team as soon as possible: Allergic reactions--skin rash, itching, hives, swelling of the face, lips, tongue, or throat Gallbladder problems--severe stomach pain, nausea, vomiting, fever High blood sugar (hyperglycemia)--increased thirst or amount of urine, unusual weakness or fatigue, blurry vision Increase in blood pressure Low blood sugar (hypoglycemia)--tremors or shaking, anxiety, sweating, cold   or clammy skin, confusion, dizziness, rapid heartbeat Low thyroid levels (hypothyroidism)--unusual weakness or fatigue, increased sensitivity to cold,  constipation, hair loss, dry skin, weight gain, feelings of depression Slow heartbeat--dizziness, feeling faint or lightheaded, confusion, trouble breathing, unusual weakness or fatigue Side effects that usually do not require medical attention (report to your care team if they continue or are bothersome): Diarrhea Dizziness Headache Muscle spasms Nausea Pain, redness, irritation, or bruising at the injection site Stomach pain This list may not describe all possible side effects. Call your doctor for medical advice about side effects. You may report side effects to FDA at 1-800-FDA-1088. Where should I keep my medication? This medication is given in a hospital or clinic. It will not be stored at home. NOTE: This sheet is a summary. It may not cover all possible information. If you have questions about this medicine, talk to your doctor, pharmacist, or health care provider.  2023 Elsevier/Gold Standard (2021-09-09 00:00:00)  

## 2022-05-22 ENCOUNTER — Ambulatory Visit (INDEPENDENT_AMBULATORY_CARE_PROVIDER_SITE_OTHER): Payer: Commercial Managed Care - HMO | Admitting: Family Medicine

## 2022-05-22 ENCOUNTER — Telehealth: Payer: Self-pay | Admitting: Hematology

## 2022-05-22 ENCOUNTER — Encounter: Payer: Self-pay | Admitting: Oncology

## 2022-05-22 ENCOUNTER — Encounter: Payer: Self-pay | Admitting: Family Medicine

## 2022-05-22 VITALS — BP 124/76 | HR 80 | Temp 97.6°F | Ht 69.0 in | Wt 254.6 lb

## 2022-05-22 DIAGNOSIS — Z23 Encounter for immunization: Secondary | ICD-10-CM | POA: Diagnosis not present

## 2022-05-22 DIAGNOSIS — Z1159 Encounter for screening for other viral diseases: Secondary | ICD-10-CM

## 2022-05-22 DIAGNOSIS — I1A0 Resistant hypertension: Secondary | ICD-10-CM

## 2022-05-22 DIAGNOSIS — D3A011 Benign carcinoid tumor of the jejunum: Secondary | ICD-10-CM | POA: Diagnosis not present

## 2022-05-22 DIAGNOSIS — R7303 Prediabetes: Secondary | ICD-10-CM

## 2022-05-22 NOTE — Progress Notes (Signed)
[facility] [address] [name] [address] Dept: [phone] Dept Fax: [phone]  Chronic Care Office Visit  Subjective:    Patient ID: Dwayne Sawyer, adult    DOB: 08-08-1970, 51 y.o..   MRN: [id number]  Chief Complaint  Patient presents with   Follow-up    3 month f/u.     History of Present Illness:  Patient is in today for reassessment of chronic medical issues.  Dwayne Sawyer  has a history of resistant hypertension. He is currently managed on amlodipine 10 mg daily, carvedilol 25 mg bid, valsartan 320 mg daily and chlorthalidone 25 mg daily.    Dwayne Sawyer has a history of hyperlipidemia. He is  managed on atorvastatin 40 mg daily.   Dwayne Sawyer has a history of prediabetes. He is managed on metformin 500 mg daily. He has had an elevated A1c in the diabetes range, but no 2nd value to meet diagnostic criteria. In early Sept, Dwayne Sawyer added a prescription for semaglutide. However, Dwayne Sawyer has been unable to obtain the medicine. His insurance was not willing to approve this without him having a more adequate trial of metformin.   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 His follow-up PET scan showed remission of his carcinoids. He is feeling well overall. He recently started back on lanreotide. He will continue to follow with Dwayne Sawyer.   Past Medical History: Patient Active Problem List   Diagnosis Date Noted   Morbid obesity, associated with hypertension, hyperlipidemia, prediabetes, and osteoarthritis ([place]) 09/15/2021   Status post total right knee replacement 09/07/2021   BMI 37.0-37.9, adult 12/10/2020   Obstructive sleep apnea syndrome, severe 11/29/2020   Hyperlipidemia 09/06/2020   Resistant hypertension 09/06/2020   Pre-diabetes 09/06/2020   Carcinoid tumor of small intestine 06/13/2019   Bowel obstruction ([place]) 06/11/2019   Rheumatoid  arthritis involving multiple sites with positive rheumatoid factor ([place]) 10/13/2015   Past Surgical History:  Procedure Laterality Date   EYE SURGERY Right    pt says we he was a baby surgery had to be done on one eye "because it wasn't as strong as the other"   [place]   inguinal- pt doesn't remember which side   LAPAROTOMY N/A 06/12/2019   Procedure: EXPLORATORY LAPAROTOMY WITH SMALL BOWEL RESECTION;  Surgeon: Johnathan Hausen, MD;  Location: [facility];  Service: General;  Laterality: N/A;   SHOULDER SURGERY Right 2001   rotator cuff repair   SMALL INTESTINE SURGERY  06/12/2019   part of ex lap procedure. pt states "3 feet of intestine was removed"   TONSILLECTOMY     as a child   TOTAL KNEE ARTHROPLASTY Right 09/07/2021   Procedure: RIGHT TOTAL KNEE ARTHROPLASTY;  Surgeon: Leandrew Koyanagi, MD;  Location: [facility];  Service: Orthopedics;  Laterality: Right;   Family History  Problem Relation Age of Onset   Cancer Father        widespread carcinoid cancer   Hypertension Mother    Hyperlipidemia Mother    Outpatient Medications Prior to Visit  Medication Sig Dispense Refill   amLODipine (NORVASC) 10 MG tablet Take 1 tablet (10 mg total) by mouth daily. 90 tablet 3   aspirin EC 81 MG tablet Take 1 tablet (81 mg total) by mouth 2 (two) times daily. To be taken after surgery 84 tablet 0   atorvastatin (LIPITOR) 40 MG tablet Take 1 tablet (40 mg total)  by mouth daily. 90 tablet 3   carvedilol (COREG) 25 MG tablet Take 1 tablet (25 mg total) by mouth 2 (two) times daily with a meal. 180 tablet 3   chlorthalidone (HYGROTON) 25 MG tablet Take 1 tablet (25 mg total) by mouth daily. 90 tablet 3   diclofenac (VOLTAREN) 75 MG EC tablet Take 1 tablet (75 mg total) by mouth 2 (two) times daily as needed. 60 tablet 2   estradiol (ESTRACE) 2 MG tablet Take 4 mg by mouth 2 (two) times daily.     LANREOTIDE ACETATE [place] Inject 120 mg into the skin every 28 (twenty-eight) days.     metFORMIN  (GLUCOPHAGE) 500 MG tablet Take 1 tablet (500 mg total) by mouth daily with breakfast. 90 tablet 3   progesterone 200 MG SUPP Place 200 mg vaginally at bedtime.     Semaglutide-Weight Management (WEGOVY) 0.25 MG/0.5ML SOAJ Inject 0.25 mg into the skin once a week. FOR 4 WEEKS 2 mL 0   tamsulosin (FLOMAX) 0.4 MG CAPS capsule Take 1 capsule (0.4 mg total) by mouth daily. 90 capsule 3   valsartan (DIOVAN) 320 MG tablet Take 1 tablet (320 mg total) by mouth daily. 90 tablet 3   estrogens, conjugated, (PREMARIN) 0.3 MG tablet Take 0.3-0.6 mg by mouth See admin instructions. Take 0.6 mg by mouth in the morning and 0.3 mg in the evening (Patient not taking: Reported on 05/22/2022)     No facility-administered medications prior to visit.   No Known Allergies    Objective:   Today's Vitals   05/22/22 1410  BP: 124/76  Pulse: 80  Temp: 97.6 F (36.4 C)  TempSrc: Temporal  SpO2: 96%  Weight: 254 lb 9.6 oz (115.5 kg)  Height: '5\' 9"'$  (1.753 m)   Body mass index is 37.6 kg/m.   General: Well developed, well nourished. No acute distress. Psych: Alert and oriented. Normal mood and affect.  Health Maintenance Due  Topic Date Due   Hepatitis C Screening  Never done   TETANUS/TDAP  Never done     Assessment & Plan:   1. Pre-diabetes I will reassess the A1c to determine if Dwayne Sawyer meets ADA criteria for diabetes. If so, we can try resubmitting the semaglutide order for Ozempic rather than [facility]. We might also increase his metformin to 500 mg bid.  - Hemoglobin A1c  2. Resistant hypertension Blood pressure is at goal. Continue amlodipine 10 mg daily, carvedilol 25 mg bid, valsartan 320 mg daily and chlorthalidone 25 mg daily.   3. Carcinoid tumor of jejunum, unspecified whether malignant In remission. Continue lanreotide 120 mg q 28 days.  4. Encounter for hepatitis C screening test for low risk patient  - HCV Ab w Reflex to Quant PCR  5. Need for tetanus, diphtheria, and acellular  pertussis (Tdap) vaccine  - Tdap vaccine greater than or equal to 7yo IM   Return in about 3 months (around 08/22/2022) for Reassessment.   [name], MD

## 2022-05-22 NOTE — Telephone Encounter (Signed)
Contacted patient to scheduled appointments. Patient is aware of appointments that are scheduled.   

## 2022-05-23 ENCOUNTER — Encounter: Payer: Self-pay | Admitting: Family Medicine

## 2022-05-23 LAB — HCV INTERPRETATION

## 2022-05-23 LAB — HCV AB W REFLEX TO QUANT PCR: HCV Ab: NONREACTIVE

## 2022-05-23 LAB — HEMOGLOBIN A1C: Hgb A1c MFr Bld: 6.2 % (ref 4.6–6.5)

## 2022-05-25 ENCOUNTER — Ambulatory Visit: Payer: Commercial Managed Care - HMO

## 2022-06-16 ENCOUNTER — Inpatient Hospital Stay: Payer: Commercial Managed Care - HMO | Attending: Hematology

## 2022-06-16 ENCOUNTER — Other Ambulatory Visit: Payer: Self-pay

## 2022-06-16 VITALS — BP 137/93 | HR 69 | Temp 98.8°F | Resp 18

## 2022-06-16 DIAGNOSIS — C7B8 Other secondary neuroendocrine tumors: Secondary | ICD-10-CM | POA: Insufficient documentation

## 2022-06-16 DIAGNOSIS — C7A8 Other malignant neuroendocrine tumors: Secondary | ICD-10-CM | POA: Insufficient documentation

## 2022-06-16 DIAGNOSIS — D3A011 Benign carcinoid tumor of the jejunum: Secondary | ICD-10-CM

## 2022-06-16 MED ORDER — LANREOTIDE ACETATE 120 MG/0.5ML ~~LOC~~ SOLN
120.0000 mg | Freq: Once | SUBCUTANEOUS | Status: AC
  Administered 2022-06-16: 120 mg via SUBCUTANEOUS
  Filled 2022-06-16: qty 120

## 2022-07-05 ENCOUNTER — Ambulatory Visit: Payer: Commercial Managed Care - HMO | Admitting: Dermatology

## 2022-07-12 NOTE — Progress Notes (Deleted)
[facility]   [phone] Fax:(336) [phone]   Clinic Follow up Note   Patient Care Team: [name], MD as PCP - General (Family Medicine) Gregor Hams, MD as Consulting Physician (Sports Medicine) Truitt Merle, MD as Consulting Physician (Hematology) Leandrew Koyanagi, MD as Attending Physician (Orthopedic Surgery) Skeet Latch, MD as Attending Physician (Cardiology) [place] as Attending Physician (Radiology)  Date of Service:  07/12/2022  CHIEF COMPLAINT: f/u of neuroendocrine tumor    CURRENT THERAPY:  Surveillance   ASSESSMENT: *** Dwayne Sawyer is a 51 y.o. male with   No problem-specific Assessment & Plan notes found for this encounter.  ***   PLAN:  SUMMARY OF ONCOLOGIC HISTORY: Oncology History Overview Note  Cancer Staging No matching staging information was found for the patient.    Carcinoid tumor of small intestine  06/11/2019 Imaging   US Abdomen 06/11/19  IMPRESSION: 1. There is a solid mass in the right lobe of the liver which based on current measurements may have enlarged since the prior study from 2015. Images from the previous MR at that time cannot be retrieved. Given this circumstance, it may be prudent to correlate with pre and serial post-contrast MR or CT of the liver to further evaluate. There is underlying diffuse increase in liver echogenicity, a finding indicative of hepatic steatosis.   2. Multiple loops of fluid-filled bowel. Question a degree of ileus or enteritis.   3. Study otherwise unremarkable. Note that much of the common bile duct is obscured by gas.    06/11/2019 Imaging   CT AP W Contrast 06/11/19   IMPRESSION: 1. High-grade small bowel obstruction secondary to desmoplastic response in the central right abdominal mesentery centered on a 4 cm calcified irregular/spiculated soft tissue lesion. Abrupt small bowel transition zone is identified immediately lead  adjacent to this mesenteric lesion and multiple adjacent bowel loops are tethered into this region with mesenteric edema/congestion. The loop of small bowel immediately proximal to the transition zone shows mild circumferential wall thickening but no pneumatosis. 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 2. Small lymph nodes in the abnormal right mesentery suggest additional metastatic involvement. 3. Heterogeneous liver parenchyma, likely secondary to geographic fatty deposition. There is a focal 14 mm low-density lesion in the right liver. The patient had an MRI in the [facility] on 08/09/2013 to evaluate a right liver lesion, but those images are not available. Follow-up MRI without and with contrast recommended to exclude metastatic disease. 4. Tiny sclerotic foci in the T12 vertebral body in both femoral heads are likely benign, but close attention on follow-up recommended. 5.  Aortic Atherosclerois (ICD10-170.0)      06/12/2019 Surgery    EXPLORATORY LAPAROTOMY WITH SMALL BOWEL RESECTION by Dr. 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 Interval resection small bowel and mesenteric mass with no evidence residual mesenteric or small bowel neuroendocrine tumor. 2.  Large lesion in RIGHT hepatic lobe with intense radiotracer activity consistent with well differentiated  neuroendocrine tumor metastasis. Smaller lesion in the anterior LEFT hepatic lobe is concerning for second hepatic metastasis. 3. Pulmonary nodule measuring 6 mm in LEFT upper lobe is not have radiotracer activity. Smaller RIGHT upper lobe nodule. Recommend close attention on follow-up.   08/01/2019 - 05/07/2020 Chemotherapy   Lanreotide injection monthly starting 08/01/19. Stopped after 05/07/20 due to disease progression     08/04/2019 Imaging   MRI abdomen  IMPRESSION: 1. The previously noted lesion in the right lobe of the liver has grown slightly compared to 2015. In addition, today's study demonstrates 2 smaller lesions with similar imaging characteristics. Given the activity on the recent PET Dotatate scan, these lesions are all compatible with small neuroendocrine metastatic lesions. 2. Hepatic steatosis.   02/10/2020 Imaging   MRI abdomen  IMPRESSION: 1. The dominant right hepatic lobe mass has mildly increased in size compared to the prior exam. This currently measures 5.4 by 4.5 cm, previously 4.9 by 4.2 cm. 2. Nine additional small T2 hyperintense foci in the liver are stable, and while the small size makes these less specific, there likely small metastatic lesions. Today's exam has the benefit of less motion artifact compared to the 06/03/2020 MRI, and I was able to pick at each of these tiny lesions on the prior exam in retrospect. It is conceivable that 1 or more of these tiny lesions could represent small hemangiomas, although I do not observe classic delayed enhancement pattern. 3. Diffuse hepatic steatosis.   05/31/2020 PET scan   DOTATATE PET  IMPRESSION: 1. Progression of well differentiated neuroendocrine tumor hepatic metastasis with increase in size and radiotracer activity of dominant lesion in the LEFT hepatic lobe and multiple small lesions as described above. 2. New small focus of radiotracer activity within the head of the pancreas. 3. Increased  hepatic steatosis.      Chemotherapy   Lutathera treatments on 08/11/20, 10/06/20, 12/01/20, 01/26/21      04/28/2021 PET scan   NETSPOT GA 68 DOTATATE  IMPRESSION: 1. Interval decrease in radiotracer activity of neuroendocrine tumor hepatic metastasis. Dominant lesion the RIGHT hepatic lobe is mildly in size. Several small peripheral lesions now have no radiotracer activity. Lesions are conspicuous in liver on noncontrast exam due to hepatic steatosis. 2. Small lesion in the head of the pancreas ahs decreased radiotracer activity. Lesion difficult to define on CT portion exam. 3. No evidence new metastatic neuroendocrine tumor. 4. Post RIGHT hemicolectomy anatomy without complication 5. New bilateral symmetric gynecomastia.      INTERVAL HISTORY: *** Dwayne Sawyer is here for a follow up of neuroendocrine tumor   He was last seen by  me on 04/21/2022 He presents to the clinic      All other systems were reviewed with the patient and are negative.  MEDICAL HISTORY:  Past Medical History:  Diagnosis Date   Arthritis    Cancer ([place]) 2020   liver   Diabetes mellitus without complication ([place])    type 2- pt was told he was pre-diabetic   Family history of carcinoid tumor    High cholesterol    Hypertension     SURGICAL HISTORY: Past Surgical History:  Procedure Laterality Date   EYE SURGERY Right    pt says we he was a baby surgery had to be done on one eye "because it wasn't as strong as the other"   [place]   inguinal- pt  doesn't remember which side   LAPAROTOMY N/A 06/12/2019   Procedure: EXPLORATORY LAPAROTOMY WITH SMALL BOWEL RESECTION;  Surgeon: Johnathan Hausen, MD;  Location: [facility];  Service: General;  Laterality: N/A;   SHOULDER SURGERY Right 2001   rotator cuff repair   SMALL INTESTINE SURGERY  06/12/2019   part of ex lap procedure. pt states "3 feet of intestine was removed"   TONSILLECTOMY     as a child   TOTAL KNEE ARTHROPLASTY Right 09/07/2021    Procedure: RIGHT TOTAL KNEE ARTHROPLASTY;  Surgeon: Leandrew Koyanagi, MD;  Location: [place];  Service: Orthopedics;  Laterality: Right;    I have reviewed the social history and family history with the patient and they are unchanged from previous note.  ALLERGIES:  has No Known Allergies.  MEDICATIONS:  Current Outpatient Medications  Medication Sig Dispense Refill   amLODipine (NORVASC) 10 MG tablet Take 1 tablet (10 mg total) by mouth daily. 90 tablet 3   aspirin EC 81 MG tablet Take 1 tablet (81 mg total) by mouth 2 (two) times daily. To be taken after surgery 84 tablet 0   atorvastatin (LIPITOR) 40 MG tablet Take 1 tablet (40 mg total) by mouth daily. 90 tablet 3   carvedilol (COREG) 25 MG tablet Take 1 tablet (25 mg total) by mouth 2 (two) times daily with a meal. 180 tablet 3   chlorthalidone (HYGROTON) 25 MG tablet Take 1 tablet (25 mg total) by mouth daily. 90 tablet 3   diclofenac (VOLTAREN) 75 MG EC tablet Take 1 tablet (75 mg total) by mouth 2 (two) times daily as needed. 60 tablet 2   estradiol (ESTRACE) 2 MG tablet Take 4 mg by mouth 2 (two) times daily.     LANREOTIDE ACETATE [place] Inject 120 mg into the skin every 28 (twenty-eight) days.     metFORMIN (GLUCOPHAGE) 500 MG tablet Take 1 tablet (500 mg total) by mouth daily with breakfast. 90 tablet 3   progesterone 200 MG SUPP Place 200 mg vaginally at bedtime.     Semaglutide-Weight Management (WEGOVY) 0.25 MG/0.5ML SOAJ Inject 0.25 mg into the skin once a week. FOR 4 WEEKS 2 mL 0   tamsulosin (FLOMAX) 0.4 MG CAPS capsule Take 1 capsule (0.4 mg total) by mouth daily. 90 capsule 3   valsartan (DIOVAN) 320 MG tablet Take 1 tablet (320 mg total) by mouth daily. 90 tablet 3   No current facility-administered medications for this visit.    PHYSICAL EXAMINATION: ECOG PERFORMANCE STATUS: {CHL ONC ECOG PS:[id number]}  There were no vitals filed for this visit. Wt Readings from Last 3 Encounters:  05/22/22 254 lb 9.6 oz (115.5 kg)   04/21/22 254 lb 12.8 oz (115.6 kg)  03/24/22 252 lb (114.3 kg)    {Only keep what was examined. If exam not performed, can use .CEXAM } GENERAL:alert, no distress and comfortable SKIN: skin color, texture, turgor are normal, no rashes or significant lesions EYES: normal, Conjunctiva are pink and non-injected, sclera clear {OROPHARYNX:no exudate, no erythema and lips, buccal mucosa, and tongue normal}  NECK: supple, thyroid normal size, non-tender, without nodularity LYMPH:  no palpable lymphadenopathy in the cervical, axillary {or inguinal} LUNGS: clear to auscultation and percussion with normal breathing effort HEART: regular rate & rhythm and no murmurs and no lower extremity edema ABDOMEN:abdomen soft, non-tender and normal bowel sounds Musculoskeletal:no cyanosis of digits and no clubbing  NEURO: alert & oriented x 3 with fluent speech, no focal motor/sensory deficits  LABORATORY DATA:  I have reviewed the data as listed    Latest Ref Rng & Units 04/18/2022    7:41 AM 01/19/2022    1:03 PM 10/19/2021    1:09 PM  CBC  WBC 4.0 - 10.5 K/uL 8.2  8.7  8.1   Hemoglobin 13.0 - 17.0 g/dL 11.7  12.0  11.2   Hematocrit 39.0 - 52.0 % 34.6  35.5  33.1   Platelets 150 - 400 K/uL 309  319  333         Latest Ref Rng & Units 04/18/2022    7:41 AM 01/19/2022    1:03 PM 10/19/2021    1:09 PM  CMP  Glucose 70 - 99 mg/dL 126  164  174   BUN 6 - 20 mg/dL '24  21  24   '$ Creatinine 0.61 - 1.24 mg/dL 1.33  1.19  1.30   Sodium 135 - 145 mmol/L 139  137  139   Potassium 3.5 - 5.1 mmol/L 3.5  3.7  3.8   Chloride 98 - 111 mmol/L 100  100  102   CO2 22 - 32 mmol/L '30  27  28   '$ Calcium 8.9 - 10.3 mg/dL 9.2  9.7  9.4   Total Protein 6.5 - 8.1 g/dL 7.3  7.4  7.5   Total Bilirubin 0.3 - 1.2 mg/dL 0.3  0.3  0.4   Alkaline Phos 38 - 126 U/L 66  66  91   AST 15 - 41 U/L '10  11  13   '$ ALT 0 - 44 U/L '11  13  13       '$ RADIOGRAPHIC STUDIES: I have personally reviewed the radiological images as listed  and agreed with the findings in the report. No results found.    No orders of the defined types were placed in this encounter.  All questions were answered. The patient knows to call the clinic with any problems, questions or concerns. No barriers to learning was detected. The total time spent in the appointment was {CHL ONC TIME VISIT - [id number]}.     Baldemar Friday, CMA 07/12/2022   I, Audry Riles, CMA, am acting as scribe for Truitt Merle, MD.   {Add scribe attestation statement}

## 2022-07-13 ENCOUNTER — Inpatient Hospital Stay: Payer: Commercial Managed Care - HMO

## 2022-07-13 ENCOUNTER — Inpatient Hospital Stay: Payer: Commercial Managed Care - HMO | Admitting: Hematology

## 2022-07-13 ENCOUNTER — Inpatient Hospital Stay: Payer: Commercial Managed Care - HMO | Attending: Hematology

## 2022-07-13 NOTE — Assessment & Plan Note (Deleted)
G1, 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 Pathology confirmed well differentiated neuroendocrine tumor, with metastasis to 14 of 32 LNs. Margins clear.  -07/16/19 PET shows two right hepatic lesion which are intensely hypermetabolic consistent with metastatic NET. There is also a smaller less avid left hepatic lesion that is concerning for metastasis which was not seen on CT from 06/11/19. No other evidence of mets -His MRI abdomen from 08/04/19 shows 3 liver lesions (0.8-4.4cm) compatible with neuroendocrine metastatic lesions.  -He was seen by Dr. Barry Dienes and she did not recommend surgery due to the multiple liver mets.  -He was on monthly Lanreotide injections since 08/01/19. He was found to have progression on 05/31/20 DOTATATE PET scan, and received 4 doses of Lutathera 08/17/20 - 03/17/21. -restaging DOTATATE PET on 04/28/21 showed positive response to treatment. We discontinued his lanreotide injections after 05/13/21, and restarted in 04/2022 due to diarrhea  -surveillance CT CAP on 04/18/22 showed stable disease

## 2022-07-14 ENCOUNTER — Inpatient Hospital Stay: Payer: Commercial Managed Care - HMO

## 2022-07-14 ENCOUNTER — Inpatient Hospital Stay: Payer: Commercial Managed Care - HMO | Admitting: Hematology

## 2022-08-01 NOTE — Assessment & Plan Note (Signed)
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 Pathology confirmed well differentiated neuroendocrine tumor, with metastasis to 14 of 32 LNs. Margins clear.  -07/16/19 PET shows two right hepatic lesion which are intensely hypermetabolic consistent with metastatic NET. There is also a smaller less avid left hepatic lesion that is concerning for metastasis which was not seen on CT from 06/11/19. No other evidence of mets -His MRI abdomen from 08/04/19 shows 3 liver lesions (0.8-4.4cm) compatible with neuroendocrine metastatic lesions.  -He was seen by Dr. Barry Dienes and she did not recommend surgery due to the multiple liver mets.  -He was on monthly Lanreotide injections since 08/01/19. He was found to have progression on 05/31/20 DOTATATE PET scan, and received 4 doses of Lutathera 08/17/20 - 03/17/21. -restaging DOTATATE PET on 04/28/21 showed positive response to treatment. We discontinued his lanreotide injections after 05/13/21. -surveillance CT CAP on 04/18/22 showed stable disease -Due to persistent diarrhea, he restarted lanreotide injection in September 2023,  diarrhea has resolved.  Will continue monthly.

## 2022-08-01 NOTE — Progress Notes (Unsigned)
[place]   [phone] [phone]   Clinic Follow up Note   Patient Care Team: [name], MD as PCP - General (Family Medicine) Gregor Hams, MD as Consulting Physician (Sports Medicine) Truitt Merle, MD as Consulting Physician (Hematology) Leandrew Koyanagi, MD as Attending Physician (Orthopedic Surgery) Skeet Latch, MD as Attending Physician (Cardiology) [place] as Attending Physician (Radiology)  Date of Service:  08/01/2022  CHIEF COMPLAINT: f/u of neuroendocrine tumor   CURRENT THERAPY: Surveillance    ASSESSMENT: *** Dwayne Sawyer is a 52 y.o. male with   No problem-specific Assessment & Plan notes found for this encounter.  ***   PLAN: {Everything Dr. Burr Medico talks to pt about, including reviewing scans and labs. } -{proceed with ***} -{lab with/without flush and f/u when?}   SUMMARY OF ONCOLOGIC HISTORY: Oncology History Overview Note  Cancer Staging No matching staging information was found for the patient.    Carcinoid tumor of small intestine  06/11/2019 Imaging   US Abdomen 06/11/19  IMPRESSION: 1. There is a solid mass in the right lobe of the liver which based on current measurements may have enlarged since the prior study from 2015. Images from the previous MR at that time cannot be retrieved. Given this circumstance, it may be prudent to correlate with pre and serial post-contrast MR or CT of the liver to further evaluate. There is underlying diffuse increase in liver echogenicity, a finding indicative of hepatic steatosis.   2. Multiple loops of fluid-filled bowel. Question a degree of ileus or enteritis.   3. Study otherwise unremarkable. Note that much of the common bile duct is obscured by gas.    06/11/2019 Imaging   CT AP W Contrast 06/11/19   IMPRESSION: 1. High-grade small bowel obstruction secondary to desmoplastic response in the central right abdominal  mesentery centered on a 4 cm calcified irregular/spiculated soft tissue lesion. Abrupt small bowel transition zone is identified immediately lead adjacent to this mesenteric lesion and multiple adjacent bowel loops are tethered into this region with mesenteric edema/congestion. The loop of small bowel immediately proximal to the transition zone shows mild circumferential wall thickening but no pneumatosis. 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 2. Small lymph nodes in the abnormal right mesentery suggest additional metastatic involvement. 3. Heterogeneous liver parenchyma, likely secondary to geographic fatty deposition. There is a focal 14 mm low-density lesion in the right liver. The patient had an MRI in the [facility] system on 08/09/2013 to evaluate a right liver lesion, but those images are not available. Follow-up MRI without and with contrast recommended to exclude metastatic disease. 4. Tiny sclerotic foci in the T12 vertebral body in both femoral heads are likely benign, but close attention on follow-up recommended. 5.  Aortic Atherosclerois (ICD10-170.0)      06/12/2019 Surgery    EXPLORATORY LAPAROTOMY WITH SMALL BOWEL RESECTION by Dr. 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 Interval resection small bowel and mesenteric mass with no evidence residual mesenteric or  small bowel neuroendocrine tumor. 2. Large lesion in RIGHT hepatic lobe with intense radiotracer activity consistent with well differentiated neuroendocrine tumor metastasis. Smaller lesion in the anterior LEFT hepatic lobe is concerning for second hepatic metastasis. 3. Pulmonary nodule measuring 6 mm in LEFT upper lobe is not have radiotracer activity. Smaller RIGHT upper lobe nodule. Recommend close attention on follow-up.   08/01/2019 - 05/07/2020 Chemotherapy   Lanreotide injection monthly starting 08/01/19. Stopped after 05/07/20 due to disease progression     08/04/2019 Imaging   MRI abdomen  IMPRESSION: 1. The previously noted lesion in the right lobe of the liver has grown slightly compared to 2015. In addition, today's study demonstrates 2 smaller lesions with similar imaging characteristics. Given the activity on the recent PET Dotatate scan, these lesions are all compatible with small neuroendocrine metastatic lesions. 2. Hepatic steatosis.   02/10/2020 Imaging   MRI abdomen  IMPRESSION: 1. The dominant right hepatic lobe mass has mildly increased in size compared to the prior exam. This currently measures 5.4 by 4.5 cm, previously 4.9 by 4.2 cm. 2. Nine additional small T2 hyperintense foci in the liver are stable, and while the small size makes these less specific, there likely small metastatic lesions. Today's exam has the benefit of less motion artifact compared to the 06/03/2020 MRI, and I was able to pick at each of these tiny lesions on the prior exam in retrospect. It is conceivable that 1 or more of these tiny lesions could represent small hemangiomas, although I do not observe classic delayed enhancement pattern. 3. Diffuse hepatic steatosis.   05/31/2020 PET scan   DOTATATE PET  IMPRESSION: 1. Progression of well differentiated neuroendocrine tumor hepatic metastasis with increase in size and radiotracer activity of dominant lesion in the LEFT hepatic lobe  and multiple small lesions as described above. 2. New small focus of radiotracer activity within the head of the pancreas. 3. Increased hepatic steatosis.      Chemotherapy   Lutathera treatments on 08/11/20, 10/06/20, 12/01/20, 01/26/21      04/28/2021 PET scan   NETSPOT GA 68 DOTATATE  IMPRESSION: 1. Interval decrease in radiotracer activity of neuroendocrine tumor hepatic metastasis. Dominant lesion the RIGHT hepatic lobe is mildly in size. Several small peripheral lesions now have no radiotracer activity. Lesions are conspicuous in liver on noncontrast exam due to hepatic steatosis. 2. Small lesion in the head of the pancreas ahs decreased radiotracer activity. Lesion difficult to define on CT portion exam. 3. No evidence new metastatic neuroendocrine tumor. 4. Post RIGHT hemicolectomy anatomy without complication 5. New bilateral symmetric gynecomastia.      INTERVAL HISTORY: *** Dwayne Sawyer is here for a follow up of neuroendocrine tumor  He was last seen by me on 04/21/22 He presents to the clinic   All other systems were reviewed with the patient and are negative.  MEDICAL HISTORY:  Past Medical History:  Diagnosis Date   Arthritis    Cancer ([place]) 2020   liver   Diabetes mellitus without complication ([place])    type 2- pt was told he was pre-diabetic   Family history of carcinoid tumor    High cholesterol    Hypertension     SURGICAL HISTORY: Past Surgical History:  Procedure Laterality Date   EYE SURGERY Right    pt says we he was a baby surgery had to be done on one eye "because it wasn't  as strong as the other"   [place]   inguinal- pt doesn't remember which side   LAPAROTOMY N/A 06/12/2019   Procedure: EXPLORATORY LAPAROTOMY WITH SMALL BOWEL RESECTION;  Surgeon: Johnathan Hausen, MD;  Location: [facility];  Service: General;  Laterality: N/A;   SHOULDER SURGERY Right 2001   rotator cuff repair   SMALL INTESTINE SURGERY  06/12/2019   part of ex lap  procedure. pt states "3 feet of intestine was removed"   TONSILLECTOMY     as a child   TOTAL KNEE ARTHROPLASTY Right 09/07/2021   Procedure: RIGHT TOTAL KNEE ARTHROPLASTY;  Surgeon: Leandrew Koyanagi, MD;  Location: [place];  Service: Orthopedics;  Laterality: Right;    I have reviewed the social history and family history with the patient and they are unchanged from previous note.  ALLERGIES:  has No Known Allergies.  MEDICATIONS:  Current Outpatient Medications  Medication Sig Dispense Refill   amLODipine (NORVASC) 10 MG tablet Take 1 tablet (10 mg total) by mouth daily. 90 tablet 3   aspirin EC 81 MG tablet Take 1 tablet (81 mg total) by mouth 2 (two) times daily. To be taken after surgery 84 tablet 0   atorvastatin (LIPITOR) 40 MG tablet Take 1 tablet (40 mg total) by mouth daily. 90 tablet 3   carvedilol (COREG) 25 MG tablet Take 1 tablet (25 mg total) by mouth 2 (two) times daily with a meal. 180 tablet 3   chlorthalidone (HYGROTON) 25 MG tablet Take 1 tablet (25 mg total) by mouth daily. 90 tablet 3   diclofenac (VOLTAREN) 75 MG EC tablet Take 1 tablet (75 mg total) by mouth 2 (two) times daily as needed. 60 tablet 2   estradiol (ESTRACE) 2 MG tablet Take 4 mg by mouth 2 (two) times daily.     LANREOTIDE ACETATE [place] Inject 120 mg into the skin every 28 (twenty-eight) days.     metFORMIN (GLUCOPHAGE) 500 MG tablet Take 1 tablet (500 mg total) by mouth daily with breakfast. 90 tablet 3   progesterone 200 MG SUPP Place 200 mg vaginally at bedtime.     Semaglutide-Weight Management (WEGOVY) 0.25 MG/0.5ML SOAJ Inject 0.25 mg into the skin once a week. FOR 4 WEEKS 2 mL 0   tamsulosin (FLOMAX) 0.4 MG CAPS capsule Take 1 capsule (0.4 mg total) by mouth daily. 90 capsule 3   valsartan (DIOVAN) 320 MG tablet Take 1 tablet (320 mg total) by mouth daily. 90 tablet 3   No current facility-administered medications for this visit.    PHYSICAL EXAMINATION: ECOG PERFORMANCE STATUS: {CHL ONC ECOG  PS:[phone]}  There were no vitals filed for this visit. Wt Readings from Last 3 Encounters:  05/22/22 254 lb 9.6 oz (115.5 kg)  04/21/22 254 lb 12.8 oz (115.6 kg)  03/24/22 252 lb (114.3 kg)    {Only keep what was examined. If exam not performed, can use .CEXAM } GENERAL:alert, no distress and comfortable SKIN: skin color, texture, turgor are normal, no rashes or significant lesions EYES: normal, Conjunctiva are pink and non-injected, sclera clear {OROPHARYNX:no exudate, no erythema and lips, buccal mucosa, and tongue normal}  NECK: supple, thyroid normal size, non-tender, without nodularity LYMPH:  no palpable lymphadenopathy in the cervical, axillary {or inguinal} LUNGS: clear to auscultation and percussion with normal breathing effort HEART: regular rate & rhythm and no murmurs and no lower extremity edema ABDOMEN:abdomen soft, non-tender and normal bowel sounds Musculoskeletal:no cyanosis of digits and no clubbing  NEURO: alert &  oriented x 3 with fluent speech, no focal motor/sensory deficits  LABORATORY DATA:  I have reviewed the data as listed    Latest Ref Rng & Units 04/18/2022    7:41 AM 01/19/2022    1:03 PM 10/19/2021    1:09 PM  CBC  WBC 4.0 - 10.5 K/uL 8.2  8.7  8.1   Hemoglobin 13.0 - 17.0 g/dL 11.7  12.0  11.2   Hematocrit 39.0 - 52.0 % 34.6  35.5  33.1   Platelets 150 - 400 K/uL 309  319  333         Latest Ref Rng & Units 04/18/2022    7:41 AM 01/19/2022    1:03 PM 10/19/2021    1:09 PM  CMP  Glucose 70 - 99 mg/dL 126  164  174   BUN 6 - 20 mg/dL '24  21  24   '$ Creatinine 0.61 - 1.24 mg/dL 1.33  1.19  1.30   Sodium 135 - 145 mmol/L 139  137  139   Potassium 3.5 - 5.1 mmol/L 3.5  3.7  3.8   Chloride 98 - 111 mmol/L 100  100  102   CO2 22 - 32 mmol/L '30  27  28   '$ Calcium 8.9 - 10.3 mg/dL 9.2  9.7  9.4   Total Protein 6.5 - 8.1 g/dL 7.3  7.4  7.5   Total Bilirubin 0.3 - 1.2 mg/dL 0.3  0.3  0.4   Alkaline Phos 38 - 126 U/L 66  66  91   AST 15 - 41 U/L '10   11  13   '$ ALT 0 - 44 U/L '11  13  13       '$ RADIOGRAPHIC STUDIES: I have personally reviewed the radiological images as listed and agreed with the findings in the report. No results found.    No orders of the defined types were placed in this encounter.  All questions were answered. The patient knows to call the clinic with any problems, questions or concerns. No barriers to learning was detected. The total time spent in the appointment was {CHL ONC TIME VISIT - QZESP:[id number]}.     Baldemar Friday, CMA 08/01/2022   I, Audry Riles, CMA, am acting as scribe for Truitt Merle, MD.   {Add scribe attestation statement}

## 2022-08-02 ENCOUNTER — Inpatient Hospital Stay: Payer: 59 | Attending: Hematology

## 2022-08-02 ENCOUNTER — Inpatient Hospital Stay (HOSPITAL_BASED_OUTPATIENT_CLINIC_OR_DEPARTMENT_OTHER): Payer: 59 | Admitting: Hematology

## 2022-08-02 ENCOUNTER — Inpatient Hospital Stay: Payer: 59

## 2022-08-02 ENCOUNTER — Encounter: Payer: Self-pay | Admitting: Hematology

## 2022-08-02 ENCOUNTER — Encounter: Payer: Self-pay | Admitting: Oncology

## 2022-08-02 VITALS — BP 152/102 | HR 76 | Temp 99.0°F | Resp 16 | Ht 69.0 in | Wt 255.6 lb

## 2022-08-02 DIAGNOSIS — C7A011 Malignant carcinoid tumor of the jejunum: Secondary | ICD-10-CM

## 2022-08-02 DIAGNOSIS — C7B02 Secondary carcinoid tumors of liver: Secondary | ICD-10-CM | POA: Diagnosis not present

## 2022-08-02 DIAGNOSIS — R197 Diarrhea, unspecified: Secondary | ICD-10-CM | POA: Diagnosis not present

## 2022-08-02 DIAGNOSIS — D3A011 Benign carcinoid tumor of the jejunum: Secondary | ICD-10-CM

## 2022-08-02 DIAGNOSIS — D3A098 Benign carcinoid tumors of other sites: Secondary | ICD-10-CM

## 2022-08-02 MED ORDER — LANREOTIDE ACETATE 120 MG/0.5ML ~~LOC~~ SOLN
120.0000 mg | Freq: Once | SUBCUTANEOUS | Status: AC
  Administered 2022-08-02: 120 mg via SUBCUTANEOUS
  Filled 2022-08-02: qty 120

## 2022-08-03 ENCOUNTER — Telehealth: Payer: Self-pay | Admitting: Hematology

## 2022-08-03 NOTE — Addendum Note (Signed)
Addended by: Evalee Jefferson on: 08/03/2022 11:01 AM   Modules accepted: Orders

## 2022-08-03 NOTE — Telephone Encounter (Signed)
Patient aware of all upcoming appointments 

## 2022-08-10 NOTE — Progress Notes (Signed)
I, Dwayne Sawyer, LAT, ATC acting as a scribe for Dwayne Leader, MD.  Dwayne Sawyer is a 52 y.o. male who presents to [place] at [facility] today for L knee pain. Pt completed the Orthovisc series, 3/3, on 05/20/21 in his R knee. Pt was last seen by Dr. Georgina Sawyer on 06/09/21 and was referred to orthopedic surgery, which resulted in a R TKR performed by Dr. Erlinda Sawyer on 09/07/21. Today, pt reports L knee pain that's chronic in nature, worsened over the last 3-4 years. Pt was going up the stairs on Wed and felt a sharp pain in the posterior aspect of his L knee. Pt is having trouble sleeping. Pt locates pain to the posterior and anterior-medial aspect.   L Knee swelling: no- has resolved Mechanical symptoms: yes Aggravates: walking, rotational movements Treatments tried: oral diclofenac, ice, rest  Pertinent review of systems: No fevers or chills  Relevant historical information: Hypertension.  Diabetes.  Rheumatoid arthritis.  Carcinoid tumor of small intestine.  History of right knee replacement.   Exam:  BP 114/78   Pulse 78   Ht '5\' 9"'$  (1.753 m)   Wt 254 lb (115.2 kg)   SpO2 96%   BMI 37.51 kg/m  General: Well Developed, well nourished, and in no acute distress.   MSK: Left knee moderate effusion otherwise normal-appearing Decreased range of motion pain with extension and flexion. Tender palpation medial and lateral knee. Intact strength pain with flexion and extension although strength is intact. Stable ligamentous exam no frank laxity although accuracy of exam is limited by guarding. McMurray's test mildly positive again accuracy of exam is limited by guarding. Antalgic gait.    Lab and Radiology Results  Procedure: Real-time Ultrasound Guided aspiration and injection left knee Device: Philips Affiniti 50G Images permanently stored and available for review in PACS Ultrasound evaluation prior to aspiration and injection reveals a moderate joint effusion.  Mild  degeneration medial lateral menisci.  No Baker's cyst. Verbal informed consent obtained.  Discussed risks and benefits of procedure. Warned about infection, bleeding, hyperglycemia damage to structures among others. Patient expresses understanding and agreement Time-out conducted.   Noted no overlying erythema, induration, or other signs of local infection.   Skin prepped in a sterile fashion.   Local anesthesia: Topical Ethyl chloride.   With sterile technique and under real time ultrasound guidance: 2 mL of lidocaine injected into subcutaneous tissue superior lateral knee achieving good anesthesia. Skin again sterilized with isopropyl alcohol. 18-gauge needle used to access the knee joint. 20 mL of pale yellow blood-tinged fluid aspirated. Syringe exchanged and 40 mg of Kenalog and 2 mL Marcaine injected into the knee joint. Knee effusion seem to be decompressed following aspiration and injection. Completed without difficulty   Pain moderately resolved suggesting accurate placement of the medication.   Advised to call if fevers/chills, erythema, induration, drainage, or persistent bleeding.   Images permanently stored and available for review in the ultrasound unit.  Impression: Technically successful ultrasound guided injection.   X-ray images left knee obtained today personally and independently interpreted. Severe medial compartment DJD.  Mild patellofemoral DJD.  No acute fractures are visible. Await formal radiology review     Assessment and Plan: 52 y.o. male with left knee pain and swelling.  Patient has sudden onset of knee pain and swelling.  This occurs in the setting of medial compartment DJD.  I think it is probable that he has an exacerbation of DJD or a degenerative meniscus tear to explain  his pain.  Plan for aspiration and injection today which should help.  If not better consider MRI.  Ultimately he may need knee replacement as he did with his right.   PDMP not  reviewed this encounter. Orders Placed This Encounter  Procedures   [place] LIMITED JOINT SPACE STRUCTURES LOW LEFT(NO LINKED CHARGES)    Order Specific Question:   Reason for Exam (SYMPTOM  OR DIAGNOSIS REQUIRED)    Answer:   left knee pain    Order Specific Question:   Preferred imaging location?    Answer:   [place]   DG Knee AP/LAT W/[place] Left    Standing Status:   Future    Number of Occurrences:   1    Standing Expiration Date:   09/10/2022    Order Specific Question:   Reason for Exam (SYMPTOM  OR DIAGNOSIS REQUIRED)    Answer:   left knee pain    Order Specific Question:   Preferred imaging location?    Answer:   [facility]   No orders of the defined types were placed in this encounter.    Discussed warning signs or symptoms. Please see discharge instructions. Patient expresses understanding.   The above documentation has been reviewed and is accurate and complete Dwayne Sawyer, M.D.

## 2022-08-11 ENCOUNTER — Ambulatory Visit (INDEPENDENT_AMBULATORY_CARE_PROVIDER_SITE_OTHER): Payer: 59

## 2022-08-11 ENCOUNTER — Ambulatory Visit (INDEPENDENT_AMBULATORY_CARE_PROVIDER_SITE_OTHER): Payer: 59 | Admitting: Family Medicine

## 2022-08-11 ENCOUNTER — Ambulatory Visit: Payer: Self-pay

## 2022-08-11 VITALS — BP 114/78 | HR 78 | Ht 69.0 in | Wt 254.0 lb

## 2022-08-11 DIAGNOSIS — M25562 Pain in left knee: Secondary | ICD-10-CM

## 2022-08-11 DIAGNOSIS — G8929 Other chronic pain: Secondary | ICD-10-CM

## 2022-08-11 DIAGNOSIS — M25462 Effusion, left knee: Secondary | ICD-10-CM

## 2022-08-11 NOTE — Patient Instructions (Addendum)
Thank you for coming in today.   Please get an Xray today before you leave   Call or go to the ER if you develop a large red swollen joint with extreme pain or oozing puss.    Please use Voltaren gel (Generic Diclofenac Gel) up to 4x daily for pain as needed.  This is available over-the-counter as both the name brand Voltaren gel and the generic diclofenac gel.   Let me know how it goes.

## 2022-08-14 NOTE — Progress Notes (Signed)
Left knee x-ray shows some arthritis.

## 2022-08-18 ENCOUNTER — Encounter: Payer: Self-pay | Admitting: Oncology

## 2022-08-22 ENCOUNTER — Ambulatory Visit: Payer: 59 | Admitting: Family Medicine

## 2022-08-22 ENCOUNTER — Encounter: Payer: Self-pay | Admitting: Family Medicine

## 2022-08-22 VITALS — BP 128/84 | HR 94 | Temp 96.7°F | Ht 69.0 in | Wt 255.0 lb

## 2022-08-22 DIAGNOSIS — I1A0 Resistant hypertension: Secondary | ICD-10-CM

## 2022-08-22 DIAGNOSIS — E119 Type 2 diabetes mellitus without complications: Secondary | ICD-10-CM

## 2022-08-22 DIAGNOSIS — E785 Hyperlipidemia, unspecified: Secondary | ICD-10-CM | POA: Diagnosis not present

## 2022-08-22 NOTE — Assessment & Plan Note (Signed)
Continue atorvastatin 40 mg daily.  

## 2022-08-22 NOTE — Assessment & Plan Note (Signed)
We discussed diagnostic criteria for Type 2 diabetes. Dwayne Sawyer is in good control on metformin 500 mg daily. I discussed routine screenings related to his diabetes. Completed his annual foot exam and ordered annual micral today. I recommend he see his eye doctor for a diabetic eye exam.

## 2022-08-22 NOTE — Assessment & Plan Note (Signed)
Blood pressure is in acceptable control today. Continue amlodipine 10 mg daily, carvedilol 25 mg bid, valsartan 320 mg daily and chlorthalidone 25 mg daily.

## 2022-08-22 NOTE — Progress Notes (Signed)
[facility] [address] [name] [address] Dept: [phone] Dept Fax: [phone]  Chronic Care Office Visit  Subjective:    Patient ID: Dwayne Sawyer, adult    DOB: 1971/02/15, 52 y.o..   MRN: [id number]  Chief Complaint  Patient presents with   Follow-up    3 month f/u. No concerns.       History of Present Illness:  Patient is in today for reassessment of chronic medical issues.  Mr. Dwayne Sawyer  has a history of resistant hypertension. He is currently managed on amlodipine 10 mg daily, carvedilol 25 mg bid, valsartan 320 mg daily and chlorthalidone 25 mg daily.    Mr. Dwayne Sawyer has a history of hyperlipidemia. He is  managed on atorvastatin 40 mg daily.   Mr. Dwayne Sawyer has a history of hyperglycemia. After his last visit, in review of his previous A1c, I did note he had two values > 6.5%, meeting ADA criteria for diagnosis of Type 2 diabetes. He is managed on metformin 500 mg daily. He has been prescribed semaglutide, but has been unable to get insurance approval and start on this, as he is meeting diabetes goals on metformin.   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   Bowel obstruction ([place]) 06/11/2019   Rheumatoid arthritis involving multiple sites with positive rheumatoid factor ([place]) 10/13/2015   Past Surgical History:  Procedure Laterality Date   EYE SURGERY Right    pt says we he was a baby surgery had to be done on one eye "because it wasn't as strong as the other"   [place]   inguinal- pt doesn't remember which side   LAPAROTOMY N/A 06/12/2019   Procedure: EXPLORATORY LAPAROTOMY WITH SMALL BOWEL RESECTION;  Surgeon: Johnathan Hausen, MD;  Location: [facility];  Service: General;  Laterality: N/A;   SHOULDER SURGERY Right 2001   rotator cuff repair   SMALL INTESTINE SURGERY  06/12/2019   part of ex lap procedure. pt states "3 feet of intestine was removed"   TONSILLECTOMY     as a child   TOTAL KNEE ARTHROPLASTY Right 09/07/2021   Procedure: RIGHT TOTAL KNEE ARTHROPLASTY;  Surgeon: Leandrew Koyanagi, MD;  Location: [facility];  Service: Orthopedics;  Laterality: Right;   Family History  Problem Relation Age of Onset   Cancer Father        widespread carcinoid cancer   Hypertension Mother    Hyperlipidemia Mother    Outpatient Medications Prior to Visit  Medication Sig Dispense Refill   amLODipine (NORVASC) 10 MG tablet Take 1 tablet (10 mg total) by mouth daily. 90 tablet 3   aspirin EC 81 MG tablet Take 1 tablet (81 mg total) by mouth 2 (two) times daily. To be taken after surgery 84 tablet 0   atorvastatin (LIPITOR) 40 MG tablet Take 1 tablet (40 mg total) by mouth daily. 90 tablet 3   carvedilol (COREG) 25 MG tablet Take 1 tablet (25 mg total) by mouth 2 (two) times daily with a meal. 180 tablet 3   chlorthalidone (HYGROTON) 25 MG tablet Take 1  tablet (25 mg total) by mouth daily. 90 tablet 3   diclofenac (VOLTAREN) 75 MG EC tablet Take 1 tablet (75 mg total) by mouth 2 (two) times daily as needed. 60 tablet 2   estradiol (ESTRACE) 2 MG tablet Take 4 mg by mouth 2 (two) times daily.     LANREOTIDE ACETATE [place] Inject 120 mg into the skin daily.     metFORMIN (GLUCOPHAGE) 500 MG tablet Take 1 tablet (500 mg total) by mouth daily with breakfast. 90 tablet 3   progesterone 200 MG SUPP Place 200 mg vaginally at bedtime.     tamsulosin (FLOMAX) 0.4 MG CAPS capsule Take 1 capsule (0.4 mg total) by mouth daily. 90 capsule 3   valsartan (DIOVAN) 320 MG tablet Take 1  tablet (320 mg total) by mouth daily. 90 tablet 3   Semaglutide-Weight Management (WEGOVY) 0.25 MG/0.5ML SOAJ Inject 0.25 mg into the skin once a week. FOR 4 WEEKS (Patient not taking: Reported on 08/22/2022) 2 mL 0   No facility-administered medications prior to visit.   No Known Allergies    Objective:   Today's Vitals   08/22/22 1350  BP: 128/84  Pulse: 94  Temp: (!) 96.7 F (35.9 C)  TempSrc: Temporal  SpO2: 97%  Weight: 255 lb (115.7 kg)  Height: '5\' 9"'$  (1.753 m)   Body mass index is 37.66 kg/m.   General: Well developed, well nourished. No acute distress. Feet- Skin intact. No sign of maceration between toes. Nails are normal. Dorsalis pedis and posterior tibial artery   pulses are normal. 5.07 monofilament testing normal. Psych: Alert and oriented. Normal mood and affect.  Health Maintenance Due  Topic Date Due   OPHTHALMOLOGY EXAM  Never done   Diabetic kidney evaluation - Urine ACR  Never done    Lab Results Component Ref Range & Units 3 mo ago (05/22/22) 1 yr ago (08/11/21) 1 yr ago (12/10/20) 1 yr ago (09/06/20) 3 yr ago (06/12/19)  Hgb A1c MFr Bld 4.6 - 6.5 % 6.2 7.3 High  R, CM 6.7 High  CM 6.4 CM 5.8 High       Assessment & Plan:   Problem List Items Addressed This Visit       Cardiovascular and Mediastinum   Resistant hypertension    Blood pressure is in acceptable control today. Continue amlodipine 10 mg daily, carvedilol 25 mg bid, valsartan 320 mg daily and chlorthalidone 25 mg daily.        Endocrine   Type 2 diabetes mellitus ([place]) - Primary    We discussed diagnostic criteria for Type 2 diabetes. Mr. Dwayne Sawyer is in good control on metformin 500 mg daily. I discussed routine screenings related to his diabetes. Completed his annual foot exam and ordered annual micral today. I recommend he see his eye doctor for a diabetic eye exam.      Relevant Orders   Glucose, random   Hemoglobin A1c   Microalbumin / creatinine urine ratio     Other    Hyperlipidemia    Continue atorvastatin 40 mg daily       Return in about 3 months (around 11/21/2022) for Reassessment.   Dwayne Salter, MD

## 2022-08-23 ENCOUNTER — Encounter: Payer: Self-pay | Admitting: Family Medicine

## 2022-08-23 LAB — HEMOGLOBIN A1C: Hgb A1c MFr Bld: 7.1 % — ABNORMAL HIGH (ref 4.6–6.5)

## 2022-08-23 LAB — MICROALBUMIN / CREATININE URINE RATIO
Creatinine,U: 173.1 mg/dL
Microalb Creat Ratio: 1 mg/g (ref 0.0–30.0)
Microalb, Ur: 1.7 mg/dL (ref 0.0–1.9)

## 2022-08-23 LAB — GLUCOSE, RANDOM: Glucose, Bld: 115 mg/dL — ABNORMAL HIGH (ref 70–99)

## 2022-08-30 ENCOUNTER — Inpatient Hospital Stay: Payer: 59 | Attending: Hematology

## 2022-08-30 VITALS — BP 125/86 | HR 71 | Temp 99.3°F | Resp 18

## 2022-08-30 DIAGNOSIS — C7A011 Malignant carcinoid tumor of the jejunum: Secondary | ICD-10-CM | POA: Diagnosis present

## 2022-08-30 DIAGNOSIS — C7B02 Secondary carcinoid tumors of liver: Secondary | ICD-10-CM | POA: Diagnosis not present

## 2022-08-30 DIAGNOSIS — C7B01 Secondary carcinoid tumors of distant lymph nodes: Secondary | ICD-10-CM | POA: Diagnosis not present

## 2022-08-30 DIAGNOSIS — D3A011 Benign carcinoid tumor of the jejunum: Secondary | ICD-10-CM

## 2022-08-30 MED ORDER — LANREOTIDE ACETATE 120 MG/0.5ML ~~LOC~~ SOLN
120.0000 mg | Freq: Once | SUBCUTANEOUS | Status: AC
  Administered 2022-08-30: 120 mg via SUBCUTANEOUS
  Filled 2022-08-30: qty 120

## 2022-09-01 ENCOUNTER — Ambulatory Visit: Payer: 59 | Admitting: Orthopaedic Surgery

## 2022-09-27 ENCOUNTER — Inpatient Hospital Stay: Payer: 59 | Attending: Hematology

## 2022-10-24 ENCOUNTER — Encounter: Payer: Self-pay | Admitting: Oncology

## 2022-10-24 ENCOUNTER — Other Ambulatory Visit: Payer: Self-pay

## 2022-10-24 DIAGNOSIS — C7A011 Malignant carcinoid tumor of the jejunum: Secondary | ICD-10-CM

## 2022-10-24 DIAGNOSIS — D3A011 Benign carcinoid tumor of the jejunum: Secondary | ICD-10-CM

## 2022-10-24 DIAGNOSIS — D3A098 Benign carcinoid tumors of other sites: Secondary | ICD-10-CM

## 2022-10-25 ENCOUNTER — Telehealth: Payer: Self-pay | Admitting: Hematology

## 2022-10-25 ENCOUNTER — Inpatient Hospital Stay: Payer: Self-pay

## 2022-10-25 ENCOUNTER — Inpatient Hospital Stay: Payer: Self-pay | Attending: Hematology

## 2022-10-25 ENCOUNTER — Inpatient Hospital Stay: Payer: Self-pay | Admitting: Hematology

## 2022-10-25 DIAGNOSIS — C7A011 Malignant carcinoid tumor of the jejunum: Secondary | ICD-10-CM | POA: Insufficient documentation

## 2022-10-25 DIAGNOSIS — C7B02 Secondary carcinoid tumors of liver: Secondary | ICD-10-CM | POA: Insufficient documentation

## 2022-10-25 NOTE — Telephone Encounter (Signed)
Contacted patient to scheduled appointments. Left message with appointment details and a call back number if patient had any questions or could not accommodate the time we provided.   

## 2022-10-25 NOTE — Assessment & Plan Note (Deleted)
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 Pathology confirmed well differentiated neuroendocrine tumor, with metastasis to 14 of 32 LNs. Margins clear.  -07/16/19 PET shows two right hepatic lesion which are intensely hypermetabolic consistent with metastatic NET. There is also a smaller less avid left hepatic lesion that is concerning for metastasis which was not seen on CT from 06/11/19. No other evidence of mets -His MRI abdomen from 08/04/19 shows 3 liver lesions (0.8-4.4cm) compatible with neuroendocrine metastatic lesions.  -He was seen by Dr. Byerly and she did not recommend surgery due to the multiple liver mets.  -He was on monthly Lanreotide injections since 08/01/19. He was found to have progression on 05/31/20 DOTATATE PET scan, and received 4 doses of Lutathera 08/17/20 - 03/17/21. -restaging DOTATATE PET on 04/28/21 showed positive response to treatment. We discontinued his lanreotide injections after 05/13/21. -surveillance CT CAP on 04/18/22 showed stable disease -Due to persistent diarrhea, he restarted lanreotide injection in September 2023,  diarrhea has resolved.  Will continue monthly. 

## 2022-10-26 ENCOUNTER — Ambulatory Visit (HOSPITAL_BASED_OUTPATIENT_CLINIC_OR_DEPARTMENT_OTHER): Admission: RE | Admit: 2022-10-26 | Payer: 59 | Source: Ambulatory Visit

## 2022-10-26 ENCOUNTER — Telehealth: Payer: Self-pay

## 2022-10-26 NOTE — Telephone Encounter (Signed)
LVM requesting pt to give Dr. Ernestina Penna office a call in regards to getting him rescheduled for his lab and Lanreotide Injection appt.  Awaiting pt's return telephone call.

## 2022-10-27 ENCOUNTER — Telehealth: Payer: Self-pay | Admitting: Hematology

## 2022-10-27 ENCOUNTER — Encounter: Payer: Self-pay | Admitting: Oncology

## 2022-10-27 ENCOUNTER — Inpatient Hospital Stay: Payer: Self-pay | Admitting: Hematology

## 2022-10-27 NOTE — Telephone Encounter (Signed)
Contacted patient to scheduled appointments. Left message with appointment details and a call back number if patient had any questions or could not accommodate the time we provided.   

## 2022-10-31 ENCOUNTER — Inpatient Hospital Stay: Payer: Self-pay | Admitting: Nurse Practitioner

## 2022-10-31 ENCOUNTER — Inpatient Hospital Stay: Payer: Self-pay

## 2022-11-02 ENCOUNTER — Ambulatory Visit (HOSPITAL_BASED_OUTPATIENT_CLINIC_OR_DEPARTMENT_OTHER): Admission: RE | Admit: 2022-11-02 | Payer: Self-pay | Source: Ambulatory Visit

## 2022-11-02 ENCOUNTER — Other Ambulatory Visit: Payer: Self-pay

## 2022-11-02 ENCOUNTER — Telehealth: Payer: Self-pay | Admitting: Hematology

## 2022-11-02 NOTE — Telephone Encounter (Signed)
Contacted patient to scheduled appointments. Patient is aware of appointments that are scheduled.   

## 2022-11-08 ENCOUNTER — Ambulatory Visit (HOSPITAL_BASED_OUTPATIENT_CLINIC_OR_DEPARTMENT_OTHER): Admission: RE | Admit: 2022-11-08 | Payer: Self-pay | Source: Ambulatory Visit

## 2022-11-08 ENCOUNTER — Encounter (HOSPITAL_BASED_OUTPATIENT_CLINIC_OR_DEPARTMENT_OTHER): Payer: Self-pay

## 2022-11-12 NOTE — Progress Notes (Unsigned)
Patient Care Team: Loyola Mast, MD as PCP - General (Family Medicine) Rodolph Bong, MD as Consulting Physician (Sports Medicine) Malachy Mood, MD as Consulting Physician (Hematology) Tarry Kos, MD as Attending Physician (Orthopedic Surgery) Chilton Si, MD as Attending Physician (Cardiology) [facility], [facility] as Attending Physician (Radiology)   CHIEF COMPLAINT: Follow up metastatic carcinoid   Oncology History Overview Note  Cancer Staging No matching staging information was found for the patient.    Carcinoid tumor of small intestine  06/11/2019 Imaging   US Abdomen 06/11/19  IMPRESSION: 1. There is a solid mass in the right lobe of the liver which based on current measurements may have enlarged since the prior study from 2015. Images from the previous MR at that time cannot be retrieved. Given this circumstance, it may be prudent to correlate with pre and serial post-contrast MR or CT of the liver to further evaluate. There is underlying diffuse increase in liver echogenicity, a finding indicative of hepatic steatosis.   2. Multiple loops of fluid-filled bowel. Question a degree of ileus or enteritis.   3. Study otherwise unremarkable. Note that much of the common bile duct is obscured by gas.    06/11/2019 Imaging   CT AP W Contrast 06/11/19   IMPRESSION: 1. High-grade small bowel obstruction secondary to desmoplastic response in the central right abdominal mesentery centered on a 4 cm calcified irregular/spiculated soft tissue lesion. Abrupt small bowel transition zone is identified immediately lead adjacent to this mesenteric lesion and multiple adjacent bowel loops are tethered into this region with mesenteric edema/congestion. The loop of small bowel immediately proximal to the transition zone shows mild circumferential wall thickening but no pneumatosis. Imaging features are highly suggestive of metastatic small-bowel  carcinoid tumor with small bowel obstruction. 2. Small lymph nodes in the abnormal right mesentery suggest additional metastatic involvement. 3. Heterogeneous liver parenchyma, likely secondary to geographic fatty deposition. There is a focal 14 mm low-density lesion in the right liver. The patient had an MRI in the [facility] system on 08/09/2013 to evaluate a right liver lesion, but those images are not available. Follow-up MRI without and with contrast recommended to exclude metastatic disease. 4. Tiny sclerotic foci in the T12 vertebral body in both femoral heads are likely benign, but close attention on follow-up recommended. 5.  Aortic Atherosclerois (ICD10-170.0)      06/12/2019 Surgery    EXPLORATORY LAPAROTOMY WITH SMALL BOWEL RESECTION by Dr. 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 Interval resection small bowel and mesenteric mass with no evidence residual mesenteric or small bowel neuroendocrine tumor. 2. Large lesion in RIGHT hepatic lobe with intense radiotracer activity consistent with well differentiated neuroendocrine tumor metastasis. Smaller lesion in the anterior LEFT hepatic lobe is concerning for second hepatic metastasis. 3. Pulmonary nodule measuring 6 mm in LEFT upper lobe is not have radiotracer activity. Smaller RIGHT upper lobe nodule. Recommend close attention on follow-up.   08/01/2019 -  05/07/2020 Chemotherapy    Lanreotide injection monthly starting 08/01/19. Stopped after 05/07/20 due to disease progression     08/04/2019 Imaging   MRI abdomen  IMPRESSION: 1. The previously noted lesion in the right lobe of the liver has grown slightly compared to 2015. In addition, today's study demonstrates 2 smaller lesions with similar imaging characteristics. Given the activity on the recent PET Dotatate scan, these lesions are all compatible with small neuroendocrine metastatic lesions. 2. Hepatic steatosis.   02/10/2020 Imaging   MRI abdomen  IMPRESSION: 1. The dominant right hepatic lobe mass has mildly increased in size compared to the prior exam. This currently measures 5.4 by 4.5 cm, previously 4.9 by 4.2 cm. 2. Nine additional small T2 hyperintense foci in the liver are stable, and while the small size makes these less specific, there likely small metastatic lesions. Today's exam has the benefit of less motion artifact compared to the 06/03/2020 MRI, and I was able to pick at each of these tiny lesions on the prior exam in retrospect. It is conceivable that 1 or more of these tiny lesions could represent small hemangiomas, although I do not observe classic delayed enhancement pattern. 3. Diffuse hepatic steatosis.   05/31/2020 PET scan   DOTATATE PET  IMPRESSION: 1. Progression of well differentiated neuroendocrine tumor hepatic metastasis with increase in size and radiotracer activity of dominant lesion in the LEFT hepatic lobe and multiple small lesions as described above. 2. New small focus of radiotracer activity within the head of the pancreas. 3. Increased hepatic steatosis.      Chemotherapy   Lutathera treatments on 08/11/20, 10/06/20, 12/01/20, 01/26/21      04/28/2021 PET scan   NETSPOT GA 68 DOTATATE  IMPRESSION: 1. Interval decrease in radiotracer activity of neuroendocrine tumor hepatic metastasis. Dominant lesion the RIGHT hepatic lobe is mildly in size. Several small  peripheral lesions now have no radiotracer activity. Lesions are conspicuous in liver on noncontrast exam due to hepatic steatosis. 2. Small lesion in the head of the pancreas ahs decreased radiotracer activity. Lesion difficult to define on CT portion exam. 3. No evidence new metastatic neuroendocrine tumor. 4. Post RIGHT hemicolectomy anatomy without complication 5. New bilateral symmetric gynecomastia.      CURRENT THERAPY: Monthly lanreotide injections, restarted 03/2022, last dose 08/30/22  INTERVAL HISTORY Dwayne Sawyer returns for follow up. Last seen by Dr. Mosetta Putt 08/02/22 and last lanreotide 08/30/22 until he lost follow up due to work conflicts. He returns to resume care. Feeling well in general. Stools are soft and "fluffy" with -2 BMs per day. He has occasional hot flash. Denies pain or other new/specific complaints. He had recent restaging CT yesterday.   ROS  All other systems reviewed and negative   Past Medical History:  Diagnosis Date   Arthritis    Cancer 2020   liver   Diabetes mellitus without complication    type 2- pt was told he was pre-diabetic   Family history of carcinoid tumor    High cholesterol    Hypertension      Past Surgical History:  Procedure Laterality Date   EYE SURGERY Right    pt says we he was a baby surgery had to be done on one eye "because it wasn't as strong as the other"   HERNIA REPAIR  1988   inguinal- pt doesn't remember which side   LAPAROTOMY N/A 06/12/2019   Procedure: EXPLORATORY LAPAROTOMY WITH SMALL BOWEL RESECTION;  Surgeon: Luretha Murphy, MD;  Location: [facility];  Service: General;  Laterality: N/A;   SHOULDER SURGERY Right 2001   rotator cuff repair   SMALL INTESTINE SURGERY  06/12/2019   part of ex lap procedure. pt states "3 feet of intestine was removed"   TONSILLECTOMY     as a child   TOTAL KNEE ARTHROPLASTY Right 09/07/2021   Procedure: RIGHT TOTAL KNEE ARTHROPLASTY;  Surgeon: Tarry Kos, MD;  Location: [facility];  Service:  Orthopedics;  Laterality: Right;     Outpatient Encounter Medications as of 11/14/2022  Medication Sig   amLODipine (NORVASC) 10 MG tablet Take 1 tablet (10 mg total) by mouth daily.   aspirin EC 81 MG tablet Take 1 tablet (81 mg total) by mouth 2 (two) times daily. To be taken after surgery   atorvastatin (LIPITOR) 40 MG tablet Take 1 tablet (40 mg total) by mouth daily.   carvedilol (COREG) 25 MG tablet Take 1 tablet (25 mg total) by mouth 2 (two) times daily with a meal.   chlorthalidone (HYGROTON) 25 MG tablet Take 1 tablet (25 mg total) by mouth daily.   diclofenac (VOLTAREN) 75 MG EC tablet Take 1 tablet (75 mg total) by mouth 2 (two) times daily as needed.   LANREOTIDE ACETATE [place] Inject 120 mg into the skin daily.   metFORMIN (GLUCOPHAGE) 500 MG tablet Take 1 tablet (500 mg total) by mouth daily with breakfast.   tamsulosin (FLOMAX) 0.4 MG CAPS capsule Take 1 capsule (0.4 mg total) by mouth daily.   valsartan (DIOVAN) 320 MG tablet Take 1 tablet (320 mg total) by mouth daily.   Semaglutide-Weight Management (WEGOVY) 0.25 MG/0.5ML SOAJ Inject 0.25 mg into the skin once a week. FOR 4 WEEKS (Patient not taking: Reported on 08/22/2022)   [DISCONTINUED] estradiol (ESTRACE) 2 MG tablet Take 4 mg by mouth 2 (two) times daily. (Patient not taking: Reported on 11/14/2022)   [DISCONTINUED] progesterone 200 MG SUPP Place 200 mg vaginally at bedtime. (Patient not taking: Reported on 11/14/2022)   No facility-administered encounter medications on file as of 11/14/2022.     Today's Vitals   11/14/22 1214  BP: (!) 140/94  Pulse: 83  Resp: 18  Temp: 98.8 F (37.1 C)  TempSrc: Oral  SpO2: 97%  Weight: 254 lb 12.8 oz (115.6 kg)  Height:  (1.753 m)   Body mass index is 37.63 kg/m.   PHYSICAL EXAM GENERAL:alert, no distress and comfortable SKIN: no rash  EYES: sclera clear NECK: without mass LYMPH:  no palpable cervical or supraclavicular lymphadenopathy  LUNGS: clear with normal  breathing effort HEART: regular rate & rhythm, no lower extremity edema ABDOMEN: abdomen soft, non-tender and normal bowel sounds NEURO: alert & oriented x 3 with fluent speech, no focal motor/sensory deficits   CBC    Component Value Date/Time   WBC 8.0 11/14/2022 1150   WBC 11.1 (H) 10/02/2021 1032   RBC 4.75 11/14/2022 1150   HGB 14.3 11/14/2022 1150   HCT 42.4 11/14/2022 1150   PLT 354 11/14/2022 1150   MCV 89.3 11/14/2022 1150   MCH 30.1 11/14/2022 1150   MCHC 33.7 11/14/2022 1150   RDW 13.4 11/14/2022 1150   LYMPHSABS 1.2 11/14/2022 1150   MONOABS 0.6 11/14/2022 1150   EOSABS 0.3 11/14/2022 1150   BASOSABS 0.1 11/14/2022 1150     CMP     Component Value Date/Time   NA 137 11/14/2022 1150   NA 138 11/14/2019 1428   K 4.5 11/14/2022 1150   CL 98 11/14/2022 1150  CO2 31 11/14/2022 1150   GLUCOSE 150 (H) 11/14/2022 1150   BUN 22 (H) 11/14/2022 1150   BUN 17 11/14/2019 1428   CREATININE 1.64 (H) 11/14/2022 1150   CALCIUM 10.9 (H) 11/14/2022 1150   PROT 8.2 (H) 11/14/2022 1150   ALBUMIN 4.7 11/14/2022 1150   AST 13 (L) 11/14/2022 1150   ALT 21 11/14/2022 1150   ALKPHOS 79 11/14/2022 1150   BILITOT 0.5 11/14/2022 1150   GFRNONAA 49 (L) 11/14/2022 1150   GFRAA 57 (L) 02/06/2020 1050     ASSESSMENT & PLAN:52 yo male with   1. 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 CT scan showed mesenteric mass and right hepatic lesion concerning for metastasis.  -S/p resection on 11/19, pathology confirmed well differentiated neuroendocrine tumor, measuring 5 cm with metastasis to 14 of 32 LNs. Margins clear.  -07/16/19 PET right hepatic lesion is intensely hypermetabolic consistent with metastatic NET. There is also a smaller less avid left hepatic lesion that is concerning for metastasis which was not seen on CT from 06/11/19.  -ABD  MRI 08/04/19 showed 3 liver lesions compatible with metastatic  NET. He was not a surgical candidate due to this -He began monthly Lanreotide injections 08/01/19 - 05/31/20 until progression, then 4 doses lutathera 08/17/20 - 03/17/21  -He was on observation after that until he developed persistent diarrhea and injections were restarted 03/2022 until he lost f/up 08/30/22 with last dose -Dwayne Sawyer appears well. Not very symptomatic from NET. Tolerating injections without significant side effects.  -I reviewed CT CAP from 11/13/22, which shows stable disease on my review. The report was signed out after my visit, and shows the dominant liver mass has slightly decreased. No disease progression  -Labs reviewed, CBC is normal, chromogranon A is pending, CMP shows Scr 1.64, Calcium 10.9, protein 8.2. I encouraged him to hydrate and avoid extra calcium and will monitor closely -Restart lanreotide today, continue monthly -F/up in 4 months, or sooner if needed  2. RA -previously on methotrexate and enbrel -not currently on therapy     PLAN: -CT and today's labs reviewed -Restart monthly lanreotide today as planned -Hydrate, avoid extra calcium, repeat CMP next month -F/up in 4 months, or sooner if needed  No orders of the defined types were placed in this encounter.     All questions were answered. The patient knows to call the clinic with any problems, questions or concerns. No barriers to learning were detected. I spent 20 minutes counseling the patient face to face. The total time spent in the appointment was 30 minutes and more than 50% was on counseling, review of test results, and coordination of care.   Santiago Glad, NP-C 11/14/2022

## 2022-11-13 ENCOUNTER — Encounter (HOSPITAL_BASED_OUTPATIENT_CLINIC_OR_DEPARTMENT_OTHER): Payer: Self-pay

## 2022-11-13 ENCOUNTER — Ambulatory Visit (HOSPITAL_BASED_OUTPATIENT_CLINIC_OR_DEPARTMENT_OTHER)
Admission: RE | Admit: 2022-11-13 | Discharge: 2022-11-13 | Disposition: A | Discharge status: Home / Self Care | Payer: Self-pay | Source: Ambulatory Visit | Attending: Hematology | Admitting: Hematology

## 2022-11-13 DIAGNOSIS — C7A011 Malignant carcinoid tumor of the jejunum: Secondary | ICD-10-CM | POA: Insufficient documentation

## 2022-11-13 DIAGNOSIS — D3A011 Benign carcinoid tumor of the jejunum: Secondary | ICD-10-CM | POA: Insufficient documentation

## 2022-11-13 DIAGNOSIS — D3A098 Benign carcinoid tumors of other sites: Secondary | ICD-10-CM | POA: Insufficient documentation

## 2022-11-13 LAB — POCT I-STAT CREATININE: Creatinine, Ser: 1.6 mg/dL — ABNORMAL HIGH (ref 0.61–1.24)

## 2022-11-13 MED ORDER — IOHEXOL 300 MG/ML  SOLN
100.0000 mL | Freq: Once | INTRAMUSCULAR | Status: AC | PRN
  Administered 2022-11-13: 100 mL via INTRAVENOUS

## 2022-11-14 ENCOUNTER — Inpatient Hospital Stay (HOSPITAL_BASED_OUTPATIENT_CLINIC_OR_DEPARTMENT_OTHER): Payer: Self-pay | Admitting: Nurse Practitioner

## 2022-11-14 ENCOUNTER — Inpatient Hospital Stay: Payer: Self-pay

## 2022-11-14 ENCOUNTER — Encounter: Payer: Self-pay | Admitting: Nurse Practitioner

## 2022-11-14 ENCOUNTER — Other Ambulatory Visit: Payer: Self-pay

## 2022-11-14 VITALS — BP 140/94 | HR 83 | Temp 98.8°F | Resp 18 | Ht 69.0 in | Wt 254.8 lb

## 2022-11-14 DIAGNOSIS — D3A011 Benign carcinoid tumor of the jejunum: Secondary | ICD-10-CM

## 2022-11-14 DIAGNOSIS — D3A098 Benign carcinoid tumors of other sites: Secondary | ICD-10-CM

## 2022-11-14 DIAGNOSIS — C7A011 Malignant carcinoid tumor of the jejunum: Secondary | ICD-10-CM

## 2022-11-14 LAB — CBC WITH DIFFERENTIAL (CANCER CENTER ONLY)
Abs Immature Granulocytes: 0.03 10*3/uL (ref 0.00–0.07)
Basophils Absolute: 0.1 10*3/uL (ref 0.0–0.1)
Basophils Relative: 1 %
Eosinophils Absolute: 0.3 10*3/uL (ref 0.0–0.5)
Eosinophils Relative: 3 %
HCT: 42.4 % (ref 39.0–52.0)
Hemoglobin: 14.3 g/dL (ref 13.0–17.0)
Immature Granulocytes: 0 %
Lymphocytes Relative: 15 %
Lymphs Abs: 1.2 10*3/uL (ref 0.7–4.0)
MCH: 30.1 pg (ref 26.0–34.0)
MCHC: 33.7 g/dL (ref 30.0–36.0)
MCV: 89.3 fL (ref 80.0–100.0)
Monocytes Absolute: 0.6 10*3/uL (ref 0.1–1.0)
Monocytes Relative: 7 %
Neutro Abs: 5.9 10*3/uL (ref 1.7–7.7)
Neutrophils Relative %: 74 %
Platelet Count: 354 10*3/uL (ref 150–400)
RBC: 4.75 MIL/uL (ref 4.22–5.81)
RDW: 13.4 % (ref 11.5–15.5)
WBC Count: 8 10*3/uL (ref 4.0–10.5)
nRBC: 0 % (ref 0.0–0.2)

## 2022-11-14 LAB — CMP (CANCER CENTER ONLY)
ALT: 21 U/L (ref 0–44)
AST: 13 U/L — ABNORMAL LOW (ref 15–41)
Albumin: 4.7 g/dL (ref 3.5–5.0)
Alkaline Phosphatase: 79 U/L (ref 38–126)
Anion gap: 8 (ref 5–15)
BUN: 22 mg/dL — ABNORMAL HIGH (ref 6–20)
CO2: 31 mmol/L (ref 22–32)
Calcium: 10.9 mg/dL — ABNORMAL HIGH (ref 8.9–10.3)
Chloride: 98 mmol/L (ref 98–111)
Creatinine: 1.64 mg/dL — ABNORMAL HIGH (ref 0.61–1.24)
GFR, Estimated: 49 mL/min — ABNORMAL LOW (ref >60)
Glucose, Bld: 150 mg/dL — ABNORMAL HIGH (ref 70–99)
Potassium: 4.5 mmol/L (ref 3.5–5.1)
Sodium: 137 mmol/L (ref 135–145)
Total Bilirubin: 0.5 mg/dL (ref 0.3–1.2)
Total Protein: 8.2 g/dL — ABNORMAL HIGH (ref 6.5–8.1)

## 2022-11-14 MED ORDER — LANREOTIDE ACETATE 120 MG/0.5ML ~~LOC~~ SOLN
120.0000 mg | Freq: Once | SUBCUTANEOUS | Status: AC
  Administered 2022-11-14: 120 mg via SUBCUTANEOUS
  Filled 2022-11-14: qty 120

## 2022-11-15 LAB — CHROMOGRANIN A: Chromogranin A (ng/mL): 64.3 ng/mL (ref 0.0–101.8)

## 2022-11-21 ENCOUNTER — Ambulatory Visit (INDEPENDENT_AMBULATORY_CARE_PROVIDER_SITE_OTHER): Payer: Self-pay | Admitting: Family Medicine

## 2022-11-21 ENCOUNTER — Encounter: Payer: Self-pay | Admitting: Family Medicine

## 2022-11-21 VITALS — BP 138/70 | HR 87 | Temp 98.3°F | Ht 69.0 in | Wt 255.2 lb

## 2022-11-21 DIAGNOSIS — N529 Male erectile dysfunction, unspecified: Secondary | ICD-10-CM | POA: Insufficient documentation

## 2022-11-21 DIAGNOSIS — E119 Type 2 diabetes mellitus without complications: Secondary | ICD-10-CM

## 2022-11-21 DIAGNOSIS — I1A0 Resistant hypertension: Secondary | ICD-10-CM

## 2022-11-21 DIAGNOSIS — D3A011 Benign carcinoid tumor of the jejunum: Secondary | ICD-10-CM

## 2022-11-21 DIAGNOSIS — Z7984 Long term (current) use of oral hypoglycemic drugs: Secondary | ICD-10-CM

## 2022-11-21 DIAGNOSIS — Z23 Encounter for immunization: Secondary | ICD-10-CM

## 2022-11-21 DIAGNOSIS — E785 Hyperlipidemia, unspecified: Secondary | ICD-10-CM

## 2022-11-21 LAB — GLUCOSE, RANDOM: Glucose, Bld: 165 mg/dL — ABNORMAL HIGH (ref 70–99)

## 2022-11-21 LAB — HEMOGLOBIN A1C: Hgb A1c MFr Bld: 7.2 % — ABNORMAL HIGH (ref 4.6–6.5)

## 2022-11-21 MED ORDER — TADALAFIL 20 MG PO TABS: 10.0000 mg | ORAL_TABLET | Freq: Every day | ORAL | 11 refills | Status: DC | PRN

## 2022-11-21 NOTE — Assessment & Plan Note (Signed)
Blood pressure is mildly high today, but Dwayne Sawyer has missed some recent medication doses. Continue amlodipine 10 mg daily, carvedilol 25 mg bid, valsartan 320 mg daily and chlorthalidone 25 mg daily.

## 2022-11-21 NOTE — Assessment & Plan Note (Signed)
Continue atorvastatin 40 mg daily.  

## 2022-11-21 NOTE — Assessment & Plan Note (Signed)
Remains in remission. Continue lantreotide for symptom management.

## 2022-11-21 NOTE — Assessment & Plan Note (Signed)
Discussed impact of hypertension, diabetes, and estrogen use of erectile function. We will give a trial of Cialis to see if this improves the issue.

## 2022-11-21 NOTE — Assessment & Plan Note (Signed)
We will check an A1c today. Continue metformin 500 mg daily.

## 2022-11-21 NOTE — Progress Notes (Signed)
[facility] [address] [place] [address] Dept: [phone] Dept Fax: [phone]  Chronic Care Office Visit  Subjective:    Patient ID: Dwayne Sawyer, Dwayne Sawyer    DOB: 03/08/1971, 52 y.o..   MRN: [id number]  Chief Complaint  Patient presents with   Medical Management of Chronic Issues   History of Present Illness:  Patient is in today for reassessment of chronic medical issues.  Dwayne Sawyer  has a history of resistant hypertension. He is currently managed on amlodipine 10 mg daily, carvedilol 25 mg bid, valsartan 320 mg daily and chlorthalidone 25 mg daily. He admits that he has been out of some of his meds for a few days, but plans to pick these up.   Dwayne Sawyer has a history of hyperlipidemia. He is  managed on atorvastatin 40 mg daily.   Dwayne Sawyer has a history of Type 2 diabetes, diagnosed this past year. He is managed on metformin 500 mg daily. He has been prescribed semaglutide, but has been unable to get insurance approval and start on this, as he is meeting diabetes goals on metformin.  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 He is currently in remission. He is feeling well overall. He continues lanreotide for management of diarrhea.   Dwayne Sawyer notes an issue with erectile dysfunction. For the past 2 months, he has stopped using estradiol. He feels this has helped with issues he was having with orgasms without ejaculate. However, his ability to get a satisfactory erection remains a problem.  Past Medical History: Patient Active Problem List   Diagnosis Date Noted   Erectile dysfunction 11/21/2022   Morbid obesity, associated with hypertension, hyperlipidemia, and osteoarthritis (HCC) 09/15/2021   Status post total right knee replacement 09/07/2021   BMI 37.0-37.9, Dwayne Sawyer 12/10/2020   Obstructive sleep apnea syndrome, severe 11/29/2020   Hyperlipidemia  09/06/2020   Resistant hypertension 09/06/2020   Type 2 diabetes mellitus (HCC) 09/06/2020   Carcinoid tumor of small intestine 06/13/2019   Bowel obstruction (HCC) 06/11/2019   Rheumatoid arthritis involving multiple sites with positive rheumatoid factor (HCC) 10/13/2015   Liver lesion 10/13/2015   Past Surgical History:  Procedure Laterality Date   EYE SURGERY Right    pt says we he was a baby surgery had to be done on one eye "because it wasn't as strong as the other"   HERNIA REPAIR  1988   inguinal- pt doesn't remember which side   LAPAROTOMY N/A 06/12/2019   Procedure: EXPLORATORY LAPAROTOMY WITH SMALL BOWEL RESECTION;  Surgeon: Luretha Murphy, MD;  Location: [facility];  Service: General;  Laterality: N/A;   SHOULDER SURGERY Right 2001   rotator cuff repair   SMALL INTESTINE SURGERY  06/12/2019   part of ex lap procedure. pt states "3 feet of intestine was removed"   TONSILLECTOMY     as a child   TOTAL KNEE ARTHROPLASTY Right 09/07/2021   Procedure: RIGHT TOTAL KNEE ARTHROPLASTY;  Surgeon: Tarry Kos, MD;  Location: [facility] OR;  Service: Orthopedics;  Laterality: Right;   Family History  Problem Relation Age of Onset   Cancer Father        widespread carcinoid cancer   Hypertension Mother    Hyperlipidemia Mother    Outpatient Medications Prior to Visit  Medication Sig Dispense Refill   amLODipine (NORVASC) 10 MG tablet Take 1 tablet (10 mg total) by mouth daily. 90 tablet 3  aspirin EC 81 MG tablet Take 1 tablet (81 mg total) by mouth 2 (two) times daily. To be taken after surgery 84 tablet 0   atorvastatin (LIPITOR) 40 MG tablet Take 1 tablet (40 mg total) by mouth daily. 90 tablet 3   carvedilol (COREG) 25 MG tablet Take 1 tablet (25 mg total) by mouth 2 (two) times daily with a meal. 180 tablet 3   chlorthalidone (HYGROTON) 25 MG tablet Take 1 tablet (25 mg total) by mouth daily. 90 tablet 3   diclofenac (VOLTAREN) 75 MG EC tablet Take 1 tablet (75 mg total) by mouth 2  (two) times daily as needed. 60 tablet 2   LANREOTIDE ACETATE [place] Inject 120 mg into the skin daily.     metFORMIN (GLUCOPHAGE) 500 MG tablet Take 1 tablet (500 mg total) by mouth daily with breakfast. 90 tablet 3   Semaglutide-Weight Management (WEGOVY) 0.25 MG/0.5ML SOAJ Inject 0.25 mg into the skin once a week. FOR 4 WEEKS 2 mL 0   tamsulosin (FLOMAX) 0.4 MG CAPS capsule Take 1 capsule (0.4 mg total) by mouth daily. 90 capsule 3   valsartan (DIOVAN) 320 MG tablet Take 1 tablet (320 mg total) by mouth daily. 90 tablet 3   No facility-administered medications prior to visit.   No Known Allergies Objective:   Today's Vitals   11/21/22 0931  BP: 138/70  Pulse: 87  Temp: 98.3 F (36.8 C)  TempSrc: Temporal  SpO2: 98%  Weight: 255 lb 3.2 oz (115.8 kg)  Height: 5\' 9"  (1.753 m)   Body mass index is 37.69 kg/m.   General: Well developed, well nourished. No acute distress. Psych: Alert and oriented. Normal mood and affect.  There are no preventive care reminders to display for this patient.  Imaging: CT of Chest, Abdomen, and Pelvis (11/13/2022) IMPRESSION: 1. No new or progressive findings in the chest, abdomen, or pelvis. 2. The dominant right hepatic lobe lesion has decreased in size in the interval. Two additional smaller hepatic lesions are also unchanged in the interval. 3. Stable bilateral pulmonary nodules. 4. Small bilateral groin hernias, left greater than right, contain only fat. 5. 14 mm sclerotic lesion in the left sacrum is indeterminate but unchanged since 2020. Continued attention on follow-up suggested. 6. Hepatic steatosis. 7.  Aortic Atherosclerois (ICD10-170.0)  Assessment & Plan:   Problem List Items Addressed This Visit       Cardiovascular and Mediastinum   Resistant hypertension - Primary    Blood pressure is mildly high today, but Dwayne Sawyer has missed some recent medication doses. Continue amlodipine 10 mg daily, carvedilol 25 mg bid, valsartan 320  mg daily and chlorthalidone 25 mg daily.      Relevant Medications   tadalafil (CIALIS) 20 MG tablet     Digestive   Carcinoid tumor of small intestine    Remains in remission. Continue lantreotide for symptom management.        Endocrine   Type 2 diabetes mellitus (HCC)    We will check an A1c today. Continue metformin 500 mg daily.      Relevant Orders   Glucose, random   Hemoglobin A1c     Other   Hyperlipidemia    Continue atorvastatin 40 mg daily      Relevant Medications   tadalafil (CIALIS) 20 MG tablet   Erectile dysfunction    Discussed impact of hypertension, diabetes, and estrogen use of erectile function. We will give a trial of Cialis to see if this improves  the issue.      Relevant Medications   tadalafil (CIALIS) 20 MG tablet   Other Visit Diagnoses     Need for pneumococcal 20-valent conjugate vaccination       Relevant Orders   Pneumococcal conjugate vaccine 20-valent (Prevnar 20) (Completed)       Return in about 3 months (around 02/20/2023) for Reassessment.   Loyola Mast, MD

## 2022-12-12 ENCOUNTER — Inpatient Hospital Stay: Payer: Self-pay | Attending: Hematology

## 2022-12-12 ENCOUNTER — Inpatient Hospital Stay: Payer: Self-pay

## 2023-01-08 ENCOUNTER — Telehealth: Payer: Self-pay | Admitting: Hematology

## 2023-01-08 ENCOUNTER — Inpatient Hospital Stay: Payer: Self-pay | Attending: Hematology

## 2023-02-05 ENCOUNTER — Inpatient Hospital Stay: Payer: Self-pay | Attending: Hematology

## 2023-02-07 ENCOUNTER — Telehealth: Payer: Self-pay | Admitting: Hematology

## 2023-02-08 ENCOUNTER — Telehealth: Payer: Self-pay | Admitting: Hematology

## 2023-02-09 ENCOUNTER — Telehealth: Payer: Self-pay | Admitting: Hematology

## 2023-02-12 ENCOUNTER — Telehealth: Payer: Self-pay | Admitting: Hematology

## 2023-02-20 ENCOUNTER — Ambulatory Visit: Payer: Self-pay | Admitting: Family Medicine

## 2023-02-20 ENCOUNTER — Telehealth: Payer: Self-pay | Admitting: Family Medicine

## 2023-02-20 NOTE — Telephone Encounter (Signed)
Pt was a no show for an OV with Dr Veto Kemps on 02/20/23, I sent a no show letter.

## 2023-02-26 NOTE — Telephone Encounter (Signed)
1st no show, letter and text sent to reschedule

## 2023-03-04 NOTE — Assessment & Plan Note (Deleted)
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 Pathology confirmed well differentiated neuroendocrine tumor, with metastasis to 14 of 32 LNs. Margins clear.  -07/16/19 PET shows two right hepatic lesion which are intensely hypermetabolic consistent with metastatic NET. There is also a smaller less avid left hepatic lesion that is concerning for metastasis which was not seen on CT from 06/11/19. No other evidence of mets -His MRI abdomen from 08/04/19 shows 3 liver lesions (0.8-4.4cm) compatible with neuroendocrine metastatic lesions.  -He was seen by Dr. Byerly and she did not recommend surgery due to the multiple liver mets.  -He was on monthly Lanreotide injections since 08/01/19. He was found to have progression on 05/31/20 DOTATATE PET scan, and received 4 doses of Lutathera 08/17/20 - 03/17/21. -restaging DOTATATE PET on 04/28/21 showed positive response to treatment. We discontinued his lanreotide injections after 05/13/21. -surveillance CT CAP on 04/18/22 showed stable disease -Due to persistent diarrhea, he restarted lanreotide injection in September 2023,  diarrhea has resolved.  Will continue monthly. 

## 2023-03-05 ENCOUNTER — Inpatient Hospital Stay: Payer: Self-pay

## 2023-03-05 ENCOUNTER — Inpatient Hospital Stay: Payer: Self-pay | Attending: Hematology

## 2023-03-05 ENCOUNTER — Inpatient Hospital Stay: Payer: Self-pay | Admitting: Hematology

## 2023-03-05 DIAGNOSIS — C7B8 Other secondary neuroendocrine tumors: Secondary | ICD-10-CM | POA: Insufficient documentation

## 2023-03-05 DIAGNOSIS — C7A8 Other malignant neuroendocrine tumors: Secondary | ICD-10-CM | POA: Insufficient documentation

## 2023-03-05 DIAGNOSIS — D3A011 Benign carcinoid tumor of the jejunum: Secondary | ICD-10-CM

## 2023-03-10 ENCOUNTER — Telehealth: Payer: Self-pay | Admitting: Hematology

## 2023-03-13 NOTE — Assessment & Plan Note (Signed)
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 Pathology confirmed well differentiated neuroendocrine tumor, with metastasis to 14 of 32 LNs. Margins clear.  -07/16/19 PET shows two right hepatic lesion which are intensely hypermetabolic consistent with metastatic NET. There is also a smaller less avid left hepatic lesion that is concerning for metastasis which was not seen on CT from 06/11/19. No other evidence of mets -His MRI abdomen from 08/04/19 shows 3 liver lesions (0.8-4.4cm) compatible with neuroendocrine metastatic lesions.  -He was seen by Dr. Donell Beers and she did not recommend surgery due to the multiple liver mets.  -He was on monthly Lanreotide injections since 08/01/19. He was found to have progression on 05/31/20 DOTATATE PET scan, and received 4 doses of Lutathera 08/17/20 - 03/17/21. -restaging DOTATATE PET on 04/28/21 showed positive response to treatment. We discontinued his lanreotide injections after 05/13/21. -surveillance CT CAP on 04/18/22 showed stable disease -Due to persistent diarrhea, he restarted lanreotide injection in September 2023,  diarrhea has resolved.  Will continue monthly. -Restaging CT scan in April 2024 showed overall stable disease, and improved liver metastasis.  Plan to repeat CT scan again in October.

## 2023-03-14 ENCOUNTER — Inpatient Hospital Stay (HOSPITAL_BASED_OUTPATIENT_CLINIC_OR_DEPARTMENT_OTHER): Payer: Self-pay | Admitting: Hematology

## 2023-03-14 ENCOUNTER — Encounter: Payer: Self-pay | Admitting: Hematology

## 2023-03-14 ENCOUNTER — Inpatient Hospital Stay: Payer: Self-pay

## 2023-03-14 VITALS — BP 168/116 | HR 70 | Temp 98.9°F | Resp 18 | Wt 253.9 lb

## 2023-03-14 DIAGNOSIS — D3A011 Benign carcinoid tumor of the jejunum: Secondary | ICD-10-CM

## 2023-03-14 DIAGNOSIS — C7A011 Malignant carcinoid tumor of the jejunum: Secondary | ICD-10-CM

## 2023-03-14 DIAGNOSIS — D3A098 Benign carcinoid tumors of other sites: Secondary | ICD-10-CM

## 2023-03-14 LAB — CBC WITH DIFFERENTIAL (CANCER CENTER ONLY)
Abs Immature Granulocytes: 0.02 10*3/uL (ref 0.00–0.07)
Basophils Absolute: 0.1 10*3/uL (ref 0.0–0.1)
Basophils Relative: 1 %
Eosinophils Absolute: 0.2 10*3/uL (ref 0.0–0.5)
Eosinophils Relative: 2 %
HCT: 45.1 % (ref 39.0–52.0)
Hemoglobin: 14.8 g/dL (ref 13.0–17.0)
Immature Granulocytes: 0 %
Lymphocytes Relative: 14 %
Lymphs Abs: 1.1 10*3/uL (ref 0.7–4.0)
MCH: 28.5 pg (ref 26.0–34.0)
MCHC: 32.8 g/dL (ref 30.0–36.0)
MCV: 86.7 fL (ref 80.0–100.0)
Monocytes Absolute: 0.6 10*3/uL (ref 0.1–1.0)
Monocytes Relative: 8 %
Neutro Abs: 5.9 10*3/uL (ref 1.7–7.7)
Neutrophils Relative %: 75 %
Platelet Count: 299 10*3/uL (ref 150–400)
RBC: 5.2 MIL/uL (ref 4.22–5.81)
RDW: 13.9 % (ref 11.5–15.5)
WBC Count: 7.9 10*3/uL (ref 4.0–10.5)
nRBC: 0 % (ref 0.0–0.2)

## 2023-03-14 LAB — CMP (CANCER CENTER ONLY)
ALT: 12 U/L (ref 0–44)
AST: 11 U/L — ABNORMAL LOW (ref 15–41)
Albumin: 4.5 g/dL (ref 3.5–5.0)
Alkaline Phosphatase: 88 U/L (ref 38–126)
Anion gap: 7 (ref 5–15)
BUN: 25 mg/dL — ABNORMAL HIGH (ref 6–20)
CO2: 30 mmol/L (ref 22–32)
Calcium: 9.6 mg/dL (ref 8.9–10.3)
Chloride: 101 mmol/L (ref 98–111)
Creatinine: 1.48 mg/dL — ABNORMAL HIGH (ref 0.61–1.24)
GFR, Estimated: 56 mL/min — ABNORMAL LOW (ref >60)
Glucose, Bld: 165 mg/dL — ABNORMAL HIGH (ref 70–99)
Potassium: 4.5 mmol/L (ref 3.5–5.1)
Sodium: 138 mmol/L (ref 135–145)
Total Bilirubin: 0.7 mg/dL (ref 0.3–1.2)
Total Protein: 7.9 g/dL (ref 6.5–8.1)

## 2023-03-14 MED ORDER — LANREOTIDE ACETATE 120 MG/0.5ML ~~LOC~~ SOLN
120.0000 mg | Freq: Once | SUBCUTANEOUS | Status: AC
  Administered 2023-03-14: 120 mg via SUBCUTANEOUS
  Filled 2023-03-14: qty 120

## 2023-03-14 NOTE — Progress Notes (Signed)
[facility]   [phone] [phone]   Clinic Follow up Note   Patient Care Team: Loyola Mast, MD as PCP - General (Family Medicine) Rodolph Bong, MD as Consulting Physician (Sports Medicine) Malachy Mood, MD as Consulting Physician (Hematology) Tarry Kos, MD as Attending Physician (Orthopedic Surgery) Chilton Si, MD as Attending Physician (Cardiology) Diagnostic Radiology & Imaging, [facility] as Attending Physician (Radiology)  Date of Service:  03/14/2023  CHIEF COMPLAINT: f/u of metastatic carcinoid   CURRENT THERAPY:  Monthly lanreotide injections, restarted 03/2022, last dose 08/30/22   ASSESSMENT:  Dwayne Sawyer is a 52 y.o. 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 Pathology confirmed well differentiated neuroendocrine tumor, with metastasis to 14 of 32 LNs. Margins clear.  -07/16/19 PET shows two right hepatic lesion which are intensely hypermetabolic consistent with metastatic NET. There is also a smaller less avid left hepatic lesion that is concerning for metastasis which was not seen on CT from 06/11/19. No other evidence of mets -His MRI abdomen from 08/04/19 shows 3 liver lesions (0.8-4.4cm) compatible with neuroendocrine metastatic lesions.  -He was seen by Dr. Donell Beers and she did not recommend surgery due to the multiple liver mets.  -He was on monthly Lanreotide injections since 08/01/19. He was found to have progression on 05/31/20 DOTATATE PET scan, and received 4 doses of Lutathera 08/17/20 - 03/17/21. -restaging DOTATATE PET on 04/28/21 showed positive response to treatment. We discontinued his lanreotide injections after 05/13/21. -surveillance CT CAP on 04/18/22 showed stable disease -Due to persistent diarrhea, he restarted lanreotide injection in  September 2023,  diarrhea has resolved.  Will continue monthly. -Restaging CT scan in April 2024 showed overall stable disease, and improved liver metastasis.  Plan to repeat CT scan again in October.    PLAN: -lab reviewed -CMP -pending -I order CT CAP scan 04/2023 -continue Sandostatin injecton -lab and CT scan and f/u in 3 months   SUMMARY OF ONCOLOGIC HISTORY: Oncology History Overview Note  Cancer Staging No matching staging information was found for the patient.    Carcinoid tumor of small intestine  06/11/2019 Imaging   US Abdomen 06/11/19  IMPRESSION: 1. There is a solid mass in the right lobe of the liver which based on current measurements may have enlarged since the prior study from 2015. Images from the previous MR at that time cannot be retrieved. Given this circumstance, it may be prudent to correlate with pre and serial post-contrast MR or CT of the liver to further evaluate. There is underlying diffuse increase in liver echogenicity, a finding indicative of hepatic steatosis.   2. Multiple loops of fluid-filled bowel. Question a degree of ileus or enteritis.   3. Study otherwise unremarkable. Note that much of the common bile duct is obscured by gas.    06/11/2019 Imaging   CT AP W Contrast 06/11/19   IMPRESSION: 1. High-grade small bowel obstruction secondary to desmoplastic response in the central right abdominal mesentery centered on a 4 cm calcified irregular/spiculated soft tissue lesion. Abrupt small bowel transition zone is identified immediately lead adjacent to this mesenteric lesion and multiple adjacent bowel loops are tethered into this region with mesenteric edema/congestion. The loop of small bowel immediately proximal to the transition zone shows mild circumferential wall thickening but no pneumatosis. Imaging features are highly suggestive of metastatic  small-bowel carcinoid tumor with small bowel obstruction. 2. Small lymph nodes in  the abnormal right mesentery suggest additional metastatic involvement. 3. Heterogeneous liver parenchyma, likely secondary to geographic fatty deposition. There is a focal 14 mm low-density lesion in the right liver. The patient had an MRI in the [facility] system on 08/09/2013 to evaluate a right liver lesion, but those images are not available. Follow-up MRI without and with contrast recommended to exclude metastatic disease. 4. Tiny sclerotic foci in the T12 vertebral body in both femoral heads are likely benign, but close attention on follow-up recommended. 5.  Aortic Atherosclerois (ICD10-170.0)      06/12/2019 Surgery    EXPLORATORY LAPAROTOMY WITH SMALL BOWEL RESECTION by Dr. 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 Interval resection small bowel and mesenteric mass with no evidence residual mesenteric or small bowel neuroendocrine tumor. 2. Large lesion in RIGHT hepatic lobe with intense radiotracer activity consistent with well differentiated neuroendocrine tumor metastasis. Smaller lesion in the anterior LEFT hepatic lobe is concerning for second hepatic metastasis. 3. Pulmonary nodule measuring 6 mm in LEFT upper lobe is not have radiotracer activity. Smaller RIGHT upper lobe nodule. Recommend close attention on follow-up.   08/01/2019 - 05/07/2020 Chemotherapy   Lanreotide injection monthly starting 08/01/19. Stopped after 05/07/20 due to  disease progression     08/04/2019 Imaging   MRI abdomen  IMPRESSION: 1. The previously noted lesion in the right lobe of the liver has grown slightly compared to 2015. In addition, today's study demonstrates 2 smaller lesions with similar imaging characteristics. Given the activity on the recent PET Dotatate scan, these lesions are all compatible with small neuroendocrine metastatic lesions. 2. Hepatic steatosis.   02/10/2020 Imaging   MRI abdomen  IMPRESSION: 1. The dominant right hepatic lobe mass has mildly increased in size compared to the prior exam. This currently measures 5.4 by 4.5 cm, previously 4.9 by 4.2 cm. 2. Nine additional small T2 hyperintense foci in the liver are stable, and while the small size makes these less specific, there likely small metastatic lesions. Today's exam has the benefit of less motion artifact compared to the 06/03/2020 MRI, and I was able to pick at each of these tiny lesions on the prior exam in retrospect. It is conceivable that 1 or more of these tiny lesions could represent small hemangiomas, although I do not observe classic delayed enhancement pattern. 3. Diffuse hepatic steatosis.   05/31/2020 PET scan   DOTATATE PET  IMPRESSION: 1. Progression of well differentiated neuroendocrine tumor hepatic metastasis with increase in size and radiotracer activity of dominant lesion in the LEFT hepatic lobe and multiple small lesions as described above. 2. New small focus of radiotracer activity within the head of the pancreas. 3. Increased hepatic steatosis.      Chemotherapy   Lutathera treatments on 08/11/20, 10/06/20, 12/01/20, 01/26/21      04/28/2021 PET scan   NETSPOT GA 68 DOTATATE  IMPRESSION: 1. Interval decrease in radiotracer  activity of neuroendocrine tumor hepatic metastasis. Dominant lesion the RIGHT hepatic lobe is mildly in size. Several small peripheral lesions now have no radiotracer activity. Lesions are conspicuous in  liver on noncontrast exam due to hepatic steatosis. 2. Small lesion in the head of the pancreas ahs decreased radiotracer activity. Lesion difficult to define on CT portion exam. 3. No evidence new metastatic neuroendocrine tumor. 4. Post RIGHT hemicolectomy anatomy without complication 5. New bilateral symmetric gynecomastia.      INTERVAL HISTORY: Dwayne Sawyer is here for a follow up of metastatic carcinoid . He was last seen by me on 11/14/2022. He presents to the clinic alone. Pt state that he is doing well. He denies having issues clinically.    All other systems were reviewed with the patient and are negative.  MEDICAL HISTORY:  Past Medical History:  Diagnosis Date   Arthritis    Cancer (HCC) 2020   liver   Diabetes mellitus without complication (HCC)    type 2- pt was told he was pre-diabetic   Family history of carcinoid tumor    High cholesterol    Hypertension     SURGICAL HISTORY: Past Surgical History:  Procedure Laterality Date   EYE SURGERY Right    pt says we he was a baby surgery had to be done on one eye "because it wasn't as strong as the other"   HERNIA REPAIR  1988   inguinal- pt doesn't remember which side   LAPAROTOMY N/A 06/12/2019   Procedure: EXPLORATORY LAPAROTOMY WITH SMALL BOWEL RESECTION;  Surgeon: Luretha Murphy, MD;  Location: [facility];  Service: General;  Laterality: N/A;   SHOULDER SURGERY Right 2001   rotator cuff repair   SMALL INTESTINE SURGERY  06/12/2019   part of ex lap procedure. pt states "3 feet of intestine was removed"   TONSILLECTOMY     as a child   TOTAL KNEE ARTHROPLASTY Right 09/07/2021   Procedure: RIGHT TOTAL KNEE ARTHROPLASTY;  Surgeon: Tarry Kos, MD;  Location: [facility] OR;  Service: Orthopedics;  Laterality: Right;    I have reviewed the social history and family history with the patient and they are unchanged from previous note.  ALLERGIES:  has No Known Allergies.  MEDICATIONS:  Current Outpatient Medications   Medication Sig Dispense Refill   amLODipine (NORVASC) 10 MG tablet Take 1 tablet (10 mg total) by mouth daily. 90 tablet 3   aspirin EC 81 MG tablet Take 1 tablet (81 mg total) by mouth 2 (two) times daily. To be taken after surgery 84 tablet 0   atorvastatin (LIPITOR) 40 MG tablet Take 1 tablet (40 mg total) by mouth daily. 90 tablet 3   carvedilol (COREG) 25 MG tablet Take 1 tablet (25 mg total) by mouth 2 (two) times daily with a meal. 180 tablet 3   chlorthalidone (HYGROTON) 25 MG tablet Take 1 tablet (25 mg total) by mouth daily. 90 tablet 3   diclofenac (VOLTAREN) 75 MG EC tablet Take 1 tablet (75 mg total) by mouth 2 (two) times daily as needed. 60 tablet 2   LANREOTIDE ACETATE [place] Inject 120 mg into the skin daily.     metFORMIN (GLUCOPHAGE) 500 MG tablet Take 1 tablet (500 mg total) by mouth daily with breakfast. 90 tablet 3   tadalafil (CIALIS) 20 MG tablet Take 0.5-1 tablets (10-20 mg total) by mouth daily as needed for erectile dysfunction. 10 tablet 11   tamsulosin (FLOMAX) 0.4 MG CAPS capsule Take 1 capsule (0.4 mg  total) by mouth daily. 90 capsule 3   valsartan (DIOVAN) 320 MG tablet Take 1 tablet (320 mg total) by mouth daily. 90 tablet 3   No current facility-administered medications for this visit.    PHYSICAL EXAMINATION: ECOG PERFORMANCE STATUS: 0 - Asymptomatic  Vitals:   03/14/23 0918  BP: (!) 168/116  Pulse: 70  Resp: 18  Temp: 98.9 F (37.2 C)  SpO2: 97%   Wt Readings from Last 3 Encounters:  03/14/23 253 lb 14.4 oz (115.2 kg)  11/21/22 255 lb 3.2 oz (115.8 kg)  11/14/22 254 lb 12.8 oz (115.6 kg)     GENERAL:alert, no distress and comfortable SKIN: skin color normal, no rashes or significant lesions EYES: normal, Conjunctiva are pink and non-injected, sclera clear  NEURO: alert & oriented x 3 with fluent speech  LABORATORY DATA:  I have reviewed the data as listed    Latest Ref Rng & Units 03/14/2023    9:08 AM 11/14/2022   11:50 AM 04/18/2022     7:41 AM  CBC  WBC 4.0 - 10.5 K/uL 7.9  8.0  8.2   Hemoglobin 13.0 - 17.0 g/dL 08.6  57.8  46.9   Hematocrit 39.0 - 52.0 % 45.1  42.4  34.6   Platelets 150 - 400 K/uL 299  354  309         Latest Ref Rng & Units 03/14/2023    9:08 AM 11/21/2022    9:56 AM 11/14/2022   11:50 AM  CMP  Glucose 70 - 99 mg/dL 629  528  413   BUN 6 - 20 mg/dL 25   22   Creatinine 2.44 - 1.24 mg/dL 0.10   2.72   Sodium 536 - 145 mmol/L 138   137   Potassium 3.5 - 5.1 mmol/L 4.5   4.5   Chloride 98 - 111 mmol/L 101   98   CO2 22 - 32 mmol/L 30   31   Calcium 8.9 - 10.3 mg/dL 9.6   64.4   Total Protein 6.5 - 8.1 g/dL 7.9   8.2   Total Bilirubin 0.3 - 1.2 mg/dL 0.7   0.5   Alkaline Phos 38 - 126 U/L 88   79   AST 15 - 41 U/L 11   13   ALT 0 - 44 U/L 12   21       RADIOGRAPHIC STUDIES: I have personally reviewed the radiological images as listed and agreed with the findings in the report. No results found.    Orders Placed This Encounter  Procedures   CT CHEST ABDOMEN PELVIS W CONTRAST    Standing Status:   Future    Standing Expiration Date:   03/13/2024    Order Specific Question:   If indicated for the ordered procedure, I authorize the administration of contrast media per Radiology protocol    Answer:   Yes    Order Specific Question:   Does the patient have a contrast media/X-ray dye allergy?    Answer:   No    Order Specific Question:   Is patient pregnant?    Answer:   No    Order Specific Question:   Preferred imaging location?    Answer:   [facility]    Order Specific Question:   If indicated for the ordered procedure, I authorize the administration of oral contrast media per Radiology protocol    Answer:   Yes   All questions were answered. The patient knows  to call the clinic with any problems, questions or concerns. No barriers to learning was detected. The total time spent in the appointment was 20 minutes.     Malachy Mood, MD 03/14/2023   Carolin Coy, CMA, am  acting as scribe for Malachy Mood, MD.   I have reviewed the above documentation for accuracy and completeness, and I agree with the above.

## 2023-03-15 LAB — CHROMOGRANIN A: Chromogranin A (ng/mL): 67.2 ng/mL (ref 0.0–101.8)

## 2023-04-11 ENCOUNTER — Ambulatory Visit: Payer: Commercial Managed Care - HMO | Admitting: Dermatology

## 2023-04-13 ENCOUNTER — Inpatient Hospital Stay: Payer: Self-pay | Attending: Hematology

## 2023-05-11 ENCOUNTER — Inpatient Hospital Stay: Payer: Self-pay | Attending: Hematology

## 2023-06-06 ENCOUNTER — Ambulatory Visit (INDEPENDENT_AMBULATORY_CARE_PROVIDER_SITE_OTHER): Payer: Self-pay | Admitting: Family Medicine

## 2023-06-06 ENCOUNTER — Other Ambulatory Visit: Payer: Self-pay

## 2023-06-06 VITALS — BP 166/94 | HR 80 | Ht 69.0 in | Wt 254.0 lb

## 2023-06-06 DIAGNOSIS — M25562 Pain in left knee: Secondary | ICD-10-CM

## 2023-06-06 DIAGNOSIS — G8929 Other chronic pain: Secondary | ICD-10-CM

## 2023-06-06 NOTE — Patient Instructions (Signed)
Thank you for coming in today.   You received an injection today. Seek immediate medical attention if the joint becomes red, extremely painful, or is oozing fluid.  

## 2023-06-06 NOTE — Progress Notes (Signed)
   Rubin Payor, PhD, LAT, ATC acting as a scribe for Clementeen Graham, MD.  Dwayne Sawyer is a 52 y.o. adult who presents to [facility] at [facility] today for exacerbation of their L knee pain. Pt was last seen by Dr. Denyse Amass on 08/11/22 and the L knee was aspirated and injected.  Today, pt reports he only got a few wks of relief for the last steroid injection in the L knee. 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 Started about 3 wks ago. Swelling present.  Dx imaging: 08/11/22 L knee XR  Pertinent review of systems: No fevers or chills  Relevant historical information: Diabetes.  Rheumatoid arthritis.  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   MSK: Left knee moderate effusion normal-appearing otherwise.    Lab and Radiology Results  Procedure: Real-time Ultrasound Guided aspiration and injection of left knee effusion Device: Philips Affiniti 50G/GE Logiq Images permanently stored and available for review in PACS Verbal informed consent obtained.  Discussed risks and benefits of procedure. Warned about infection, bleeding, hyperglycemia damage to structures among others. Patient expresses understanding and agreement Time-out conducted.   Noted no overlying erythema, induration, or other signs of local infection.   Skin prepped in a sterile fashion.   Local anesthesia: Topical Ethyl chloride.   With sterile technique and under real time ultrasound guidance: 2 mL of lidocaine injected into subcutaneous tissue at superior patellar space achieving good anesthesia.. Skin was again sterilized with isopropyl alcohol. 18-gauge needle was used to access the joint effusion at the superior patellar space. 40 mL of clear  straw-colored fluid was aspirated. Surgical exchange and 40 mg of Kenalog and 2 mL's of Marcaine were injected in the knee joint. Completed without difficulty   Pain immediately resolved suggesting accurate placement of the medication.   Advised to call if fevers/chills, erythema, induration, drainage, or persistent bleeding.   Images permanently stored and available for review in the ultrasound unit.  Impression: Technically successful ultrasound guided injection.         Assessment and Plan: 52 y.o. adult with left knee pain due to DJD.  Plan for aspiration and injection today.  Check back as needed.   PDMP not reviewed this encounter. Orders Placed This Encounter  Procedures   [place] LIMITED JOINT SPACE STRUCTURES UP LEFT(NO LINKED CHARGES)    Order Specific Question:   Reason for Exam (SYMPTOM  OR DIAGNOSIS REQUIRED)    Answer:   left knee pain    Order Specific Question:   Preferred imaging location?    Answer:   [facility]   No orders of the defined types were placed in this encounter.    Discussed warning signs or symptoms. Please see discharge instructions. Patient expresses understanding.   The above documentation has been reviewed and is accurate and complete Clementeen Graham, M.D.

## 2023-06-07 ENCOUNTER — Inpatient Hospital Stay: Payer: Self-pay

## 2023-06-07 ENCOUNTER — Encounter: Payer: Self-pay | Admitting: Hematology

## 2023-06-07 ENCOUNTER — Inpatient Hospital Stay: Payer: Self-pay | Attending: Hematology

## 2023-06-07 ENCOUNTER — Inpatient Hospital Stay (HOSPITAL_BASED_OUTPATIENT_CLINIC_OR_DEPARTMENT_OTHER): Payer: Self-pay | Admitting: Hematology

## 2023-06-07 VITALS — BP 143/83 | HR 81 | Temp 98.7°F | Resp 18 | Ht 69.0 in | Wt 256.3 lb

## 2023-06-07 DIAGNOSIS — D3A011 Benign carcinoid tumor of the jejunum: Secondary | ICD-10-CM

## 2023-06-07 DIAGNOSIS — C7B8 Other secondary neuroendocrine tumors: Secondary | ICD-10-CM | POA: Insufficient documentation

## 2023-06-07 DIAGNOSIS — C7A8 Other malignant neuroendocrine tumors: Secondary | ICD-10-CM | POA: Insufficient documentation

## 2023-06-07 DIAGNOSIS — C7A011 Malignant carcinoid tumor of the jejunum: Secondary | ICD-10-CM

## 2023-06-07 DIAGNOSIS — D3A098 Benign carcinoid tumors of other sites: Secondary | ICD-10-CM

## 2023-06-07 LAB — CBC WITH DIFFERENTIAL (CANCER CENTER ONLY)
Abs Immature Granulocytes: 0.09 10*3/uL — ABNORMAL HIGH (ref 0.00–0.07)
Basophils Absolute: 0 10*3/uL (ref 0.0–0.1)
Basophils Relative: 0 %
Eosinophils Absolute: 0 10*3/uL (ref 0.0–0.5)
Eosinophils Relative: 0 %
HCT: 40.6 % (ref 39.0–52.0)
Hemoglobin: 13.6 g/dL (ref 13.0–17.0)
Immature Granulocytes: 1 %
Lymphocytes Relative: 4 %
Lymphs Abs: 0.8 10*3/uL (ref 0.7–4.0)
MCH: 29.8 pg (ref 26.0–34.0)
MCHC: 33.5 g/dL (ref 30.0–36.0)
MCV: 88.8 fL (ref 80.0–100.0)
Monocytes Absolute: 0.8 10*3/uL (ref 0.1–1.0)
Monocytes Relative: 4 %
Neutro Abs: 17.9 10*3/uL — ABNORMAL HIGH (ref 1.7–7.7)
Neutrophils Relative %: 91 %
Platelet Count: 322 10*3/uL (ref 150–400)
RBC: 4.57 MIL/uL (ref 4.22–5.81)
RDW: 14.6 % (ref 11.5–15.5)
WBC Count: 19.7 10*3/uL — ABNORMAL HIGH (ref 4.0–10.5)
nRBC: 0 % (ref 0.0–0.2)

## 2023-06-07 LAB — CMP (CANCER CENTER ONLY)
ALT: 23 U/L (ref 0–44)
AST: 15 U/L (ref 15–41)
Albumin: 4.8 g/dL (ref 3.5–5.0)
Alkaline Phosphatase: 67 U/L (ref 38–126)
Anion gap: 10 (ref 5–15)
BUN: 36 mg/dL — ABNORMAL HIGH (ref 6–20)
CO2: 25 mmol/L (ref 22–32)
Calcium: 10.4 mg/dL — ABNORMAL HIGH (ref 8.9–10.3)
Chloride: 101 mmol/L (ref 98–111)
Creatinine: 1.54 mg/dL — ABNORMAL HIGH (ref 0.61–1.24)
GFR, Estimated: 53 mL/min — ABNORMAL LOW (ref >60)
Glucose, Bld: 229 mg/dL — ABNORMAL HIGH (ref 70–99)
Potassium: 4.1 mmol/L (ref 3.5–5.1)
Sodium: 136 mmol/L (ref 135–145)
Total Bilirubin: 0.4 mg/dL (ref <1.2)
Total Protein: 8.4 g/dL — ABNORMAL HIGH (ref 6.5–8.1)

## 2023-06-07 MED ORDER — LANREOTIDE ACETATE 120 MG/0.5ML ~~LOC~~ SOLN
120.0000 mg | Freq: Once | SUBCUTANEOUS | Status: AC
  Administered 2023-06-07: 120 mg via SUBCUTANEOUS
  Filled 2023-06-07: qty 120

## 2023-06-07 NOTE — Assessment & Plan Note (Signed)
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 Pathology confirmed well differentiated neuroendocrine tumor, with metastasis to 14 of 32 LNs. Margins clear.  -07/16/19 PET shows two right hepatic lesion which are intensely hypermetabolic consistent with metastatic NET. There is also a smaller less avid left hepatic lesion that is concerning for metastasis which was not seen on CT from 06/11/19. No other evidence of mets -His MRI abdomen from 08/04/19 shows 3 liver lesions (0.8-4.4cm) compatible with neuroendocrine metastatic lesions.  -He was seen by Dr. Donell Sawyer and she did not recommend surgery due to the multiple liver mets.  -He was on monthly Lanreotide injections since 08/01/19. He was found to have progression on 05/31/20 DOTATATE PET scan, and received 4 doses of Lutathera 08/17/20 - 03/17/21. -restaging DOTATATE PET on 04/28/21 showed positive response to treatment. We discontinued his lanreotide injections after 05/13/21. -surveillance CT CAP on 04/18/22 showed stable disease -Due to persistent diarrhea, he restarted lanreotide injection in September 2023,  diarrhea has resolved.  Will continue monthly. -Restaging CT scan in April 2024 showed overall stable disease, and improved liver metastasis.  Plan to repeat CT scan again in October.

## 2023-06-07 NOTE — Progress Notes (Signed)
[facility]   [phone] [phone]   Clinic Follow up Note   Patient Care Team: Rodolph Bong, MD as PCP - General (Family Medicine) Rodolph Bong, MD as Consulting Physician (Sports Medicine) Malachy Mood, MD as Consulting Physician (Hematology) Tarry Kos, MD as Attending Physician (Orthopedic Surgery) Chilton Si, MD as Attending Physician (Cardiology) Diagnostic Radiology & Imaging, [facility] as Attending Physician (Radiology)  Date of Service:  06/07/2023  CHIEF COMPLAINT: f/u of metastatic carcinoid tumor  CURRENT THERAPY:  Sandostatin injection every month  Oncology History   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 Pathology confirmed well differentiated neuroendocrine tumor, with metastasis to 14 of 32 LNs. Margins clear.  -07/16/19 PET shows two right hepatic lesion which are intensely hypermetabolic consistent with metastatic NET. There is also a smaller less avid left hepatic lesion that is concerning for metastasis which was not seen on CT from 06/11/19. No other evidence of mets -His MRI abdomen from 08/04/19 shows 3 liver lesions (0.8-4.4cm) compatible with neuroendocrine metastatic lesions.  -He was seen by Dr. Donell Beers and she did not recommend surgery due to the multiple liver mets.  -He was on monthly Lanreotide injections since 08/01/19. He was found to have progression on 05/31/20 DOTATATE PET scan, and received 4 doses of Lutathera 08/17/20 - 03/17/21. -restaging DOTATATE PET on 04/28/21 showed positive response to treatment. We discontinued his lanreotide injections after 05/13/21. -surveillance CT CAP on 04/18/22 showed stable disease -Due to persistent diarrhea, he restarted lanreotide injection in September 2023,  diarrhea has resolved.  Will continue  monthly. -Restaging CT scan in April 2024 showed overall stable disease, and improved liver metastasis.  Plan to repeat CT scan again in October.    Assessment and Plan    Metastatic Neuroendocrine Tumor Follow-up for metastatic neuroendocrine tumor. Reports feeling well with no new symptoms. Occasional diarrhea related to spicy food intake. Blood counts and last tumor marker are normal. Awaiting CT scan scheduling. Discussed importance of regular CT scans every six months to monitor disease progression. Current treatment plan aims for stable disease. Patient prefers to continue current management. - Schedule CT scan - Administer monthly injection - Perform lab tests every three months - Schedule follow-up visits every three months - Perform CT scan every six months  Knee Osteoarthritis Reports knee pain and recent fluid aspiration with cortisone injection. One knee replaced, other pending replacement. Under care of Dr. Denyse Amass. Patient reluctant for another knee replacement soon due to previous experience. - Continue follow-up with Dr. Denyse Amass  General Health Maintenance Physically demanding job with high water intake but low urine output, likely due to sweating. No other general health issues reported. - Encourage continued hydration.     Plan -Lab reviewed -Will proceed Sandostatin injection today and continue monthly -Patient will call radiology to schedule his CT scan in the next few weeks -Follow-up in 3 months    SUMMARY OF ONCOLOGIC HISTORY: Oncology History Overview Note  Cancer Staging No matching staging information was found for the patient.    Carcinoid tumor of small intestine  06/11/2019 Imaging   US Abdomen 06/11/19  IMPRESSION: 1. There is a solid mass in the right lobe of the liver which based on current measurements may have enlarged since the prior study from 2015. Images from the previous MR at that time cannot be retrieved.  Given this circumstance, it may  be prudent to correlate with pre and serial post-contrast MR or CT of the liver to further evaluate. There is underlying diffuse increase in liver echogenicity, a finding indicative of hepatic steatosis.   2. Multiple loops of fluid-filled bowel. Question a degree of ileus or enteritis.   3. Study otherwise unremarkable. Note that much of the common bile duct is obscured by gas.    06/11/2019 Imaging   CT AP W Contrast 06/11/19   IMPRESSION: 1. High-grade small bowel obstruction secondary to desmoplastic response in the central right abdominal mesentery centered on a 4 cm calcified irregular/spiculated soft tissue lesion. Abrupt small bowel transition zone is identified immediately lead adjacent to this mesenteric lesion and multiple adjacent bowel loops are tethered into this region with mesenteric edema/congestion. The loop of small bowel immediately proximal to the transition zone shows mild circumferential wall thickening but no pneumatosis. 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 2. Small lymph nodes in the abnormal right mesentery suggest additional metastatic involvement. 3. Heterogeneous liver parenchyma, likely secondary to geographic fatty deposition. There is a focal 14 mm low-density lesion in the right liver. The patient had an MRI in the [facility] system on 08/09/2013 to evaluate a right liver lesion, but those images are not available. Follow-up MRI without and with contrast recommended to exclude metastatic disease. 4. Tiny sclerotic foci in the T12 vertebral body in both femoral heads are likely benign, but close attention on follow-up recommended. 5.  Aortic Atherosclerois (ICD10-170.0)      06/12/2019 Surgery    EXPLORATORY LAPAROTOMY WITH SMALL BOWEL RESECTION by Dr. Daphine Deutscher  06/12/19    06/12/2019 Initial Biopsy   FINAL MICROSCOPIC DIAGNOSIS: 06/12/19 -   Well-differentiated neuroendocrine tumor, 5.0 cm  -  Tumor invades adjacent loops of small bowel  -  Perineural invasion and extensive lymphovascular space invasion  -  Metastatic neuroendocrine tumor involving fourteen of thirty-two  lymph nodes (14/32)  -  Margins uninvolved by neoplasm  -  See oncology table and comment below    06/13/2019 Initial Diagnosis   Carcinoid tumor of intestine producing obstruction--resected Nov 2020   07/16/2019 PET scan   IMPRESSION: 1. Interval resection small bowel and mesenteric mass with no evidence residual mesenteric or small bowel neuroendocrine tumor. 2. Large lesion in RIGHT hepatic lobe with intense radiotracer activity consistent with well differentiated neuroendocrine tumor metastasis. Smaller lesion in the anterior LEFT hepatic lobe is concerning for second hepatic metastasis. 3. Pulmonary nodule measuring 6 mm in LEFT upper lobe is not have radiotracer activity. Smaller RIGHT upper lobe nodule. Recommend close attention on follow-up.   08/01/2019 - 05/07/2020 Chemotherapy   Lanreotide injection monthly starting 08/01/19. Stopped after 05/07/20 due to disease progression     08/04/2019 Imaging   MRI abdomen  IMPRESSION: 1. The previously noted lesion in the right lobe of the liver has grown slightly compared to 2015. In addition, today's study demonstrates 2 smaller lesions with similar imaging characteristics. Given the activity on the recent PET Dotatate scan, these lesions are all compatible with small neuroendocrine metastatic lesions. 2. Hepatic steatosis.   02/10/2020 Imaging   MRI abdomen  IMPRESSION: 1. The dominant right hepatic lobe mass has mildly increased in size compared to the prior exam. This currently measures 5.4 by 4.5 cm, previously 4.9 by 4.2 cm. 2. Nine additional small T2 hyperintense foci in the liver are stable, and while the small size makes  these less specific, there likely small metastatic lesions. Today's  exam has the benefit of less motion artifact compared to the 06/03/2020 MRI, and I was able to pick at each of these tiny lesions on the prior exam in retrospect. It is conceivable that 1 or more of these tiny lesions could represent small hemangiomas, although I do not observe classic delayed enhancement pattern. 3. Diffuse hepatic steatosis.   05/31/2020 PET scan   DOTATATE PET  IMPRESSION: 1. Progression of well differentiated neuroendocrine tumor hepatic metastasis with increase in size and radiotracer activity of dominant lesion in the LEFT hepatic lobe and multiple small lesions as described above. 2. New small focus of radiotracer activity within the head of the pancreas. 3. Increased hepatic steatosis.      Chemotherapy   Lutathera treatments on 08/11/20, 10/06/20, 12/01/20, 01/26/21      04/28/2021 PET scan   NETSPOT GA 68 DOTATATE  IMPRESSION: 1. Interval decrease in radiotracer activity of neuroendocrine tumor hepatic metastasis. Dominant lesion the RIGHT hepatic lobe is mildly in size. Several small peripheral lesions now have no radiotracer activity. Lesions are conspicuous in liver on noncontrast exam due to hepatic steatosis. 2. Small lesion in the head of the pancreas ahs decreased radiotracer activity. Lesion difficult to define on CT portion exam. 3. No evidence new metastatic neuroendocrine tumor. 4. Post RIGHT hemicolectomy anatomy without complication 5. New bilateral symmetric gynecomastia.      Discussed the use of AI scribe software for clinical note transcription with the patient, who gave verbal consent to proceed.  History of Present Illness   Dwayne Sawyer, a 52 year old male with a known diagnosis of metastatic neuroendocrine tumor, presents for a routine follow-up. He reports feeling generally well with no new symptoms related to his cancer. 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 He mentions occasional  diarrhea, which he attributes to his intake of spicy food. His bowel movements are regular, with about 1.5 bowel movements per day. He expresses concern if he does not have a bowel movement in a day.  In addition to his cancer, Dwayne Sawyer also reports joint pain due to a knee issue. He recently had fluid drawn from his knee and received a cortisone shot. He mentions that one of his knees has been replaced and the other needs replacement, but he is not in a hurry to get the procedure done due to his previous experience.         All other systems were reviewed with the patient and are negative.  MEDICAL HISTORY:  Past Medical History:  Diagnosis Date   Arthritis    Cancer (HCC) 2020   liver   Diabetes mellitus without complication (HCC)    type 2- pt was told he was pre-diabetic   Family history of carcinoid tumor    High cholesterol    Hypertension     SURGICAL HISTORY: Past Surgical History:  Procedure Laterality Date   EYE SURGERY Right    pt says we he was a baby surgery had to be done on one eye "because it wasn't as strong as the other"   HERNIA REPAIR  1988   inguinal- pt doesn't remember which side   LAPAROTOMY N/A 06/12/2019   Procedure: EXPLORATORY LAPAROTOMY WITH SMALL BOWEL RESECTION;  Surgeon: Luretha Murphy, MD;  Location: [facility];  Service: General;  Laterality: N/A;   SHOULDER SURGERY Right 2001   rotator cuff repair   SMALL INTESTINE SURGERY  06/12/2019   part of ex lap procedure. pt states "3 feet of intestine was removed"   TONSILLECTOMY     as a child   TOTAL KNEE ARTHROPLASTY Right 09/07/2021   Procedure: RIGHT TOTAL KNEE ARTHROPLASTY;  Surgeon: Tarry Kos, MD;  Location: [facility] OR;  Service: Orthopedics;  Laterality: Right;    I have reviewed the social history and family history with the patient and they are unchanged from previous note.  ALLERGIES:  has No Known Allergies.  MEDICATIONS:  Current Outpatient Medications  Medication Sig Dispense Refill    amLODipine (NORVASC) 10 MG tablet Take 1 tablet (10 mg total) by mouth daily. 90 tablet 3   aspirin EC 81 MG tablet Take 1 tablet (81 mg total) by mouth 2 (two) times daily. To be taken after surgery 84 tablet 0   atorvastatin (LIPITOR) 40 MG tablet Take 1 tablet (40 mg total) by mouth daily. 90 tablet 3   carvedilol (COREG) 25 MG tablet Take 1 tablet (25 mg total) by mouth 2 (two) times daily with a meal. 180 tablet 3   chlorthalidone (HYGROTON) 25 MG tablet Take 1 tablet (25 mg total) by mouth daily. 90 tablet 3   diclofenac (VOLTAREN) 75 MG EC tablet Take 1 tablet (75 mg total) by mouth 2 (two) times daily as needed. 60 tablet 2   LANREOTIDE ACETATE [place] Inject 120 mg into the skin daily.     metFORMIN (GLUCOPHAGE) 500 MG tablet Take 1 tablet (500 mg total) by mouth daily with breakfast. 90 tablet 3   tadalafil (CIALIS) 20 MG tablet Take 0.5-1 tablets (10-20 mg total) by mouth daily as needed for erectile dysfunction. 10 tablet 11   tamsulosin (FLOMAX) 0.4 MG CAPS capsule Take 1 capsule (0.4 mg total) by mouth daily. 90 capsule 3   valsartan (DIOVAN) 320 MG tablet Take 1 tablet (320 mg total) by mouth daily. 90 tablet 3   No current facility-administered medications for this visit.    PHYSICAL EXAMINATION: ECOG PERFORMANCE STATUS: 0 - Asymptomatic  Vitals:   06/07/23 1417  BP: (!) 143/83  Pulse: 81  Resp: 18  Temp: 98.7 F (37.1 C)  SpO2: 100%   Wt Readings from Last 3 Encounters:  06/07/23 256 lb 4.8 oz (116.3 kg)  06/06/23 254 lb (115.2 kg)  03/14/23 253 lb 14.4 oz (115.2 kg)     GENERAL:alert, no distress and comfortable SKIN: skin color, texture, turgor are normal, no rashes or significant lesions EYES: normal, Conjunctiva are pink and non-injected, sclera clear Musculoskeletal:no cyanosis of digits and no clubbing  NEURO: alert & oriented x 3 with fluent speech, no focal motor/sensory deficits    LABORATORY DATA:  I have reviewed the data as listed    Latest Ref Rng  & Units 06/07/2023    1:13 PM 03/14/2023    9:08 AM 11/14/2022   11:50 AM  CBC  WBC 4.0 - 10.5 K/uL 19.7  7.9  8.0   Hemoglobin 13.0 - 17.0 g/dL 16.1  09.6  04.5   Hematocrit 39.0 - 52.0 % 40.6  45.1  42.4   Platelets 150 - 400 K/uL 322  299  354         Latest Ref Rng & Units 06/07/2023    1:13 PM 03/14/2023    9:08 AM 11/21/2022    9:56 AM  CMP  Glucose 70 - 99 mg/dL 409  811  914   BUN 6 - 20 mg/dL 36  25  Creatinine 0.61 - 1.24 mg/dL 1.91  4.78    Sodium 295 - 145 mmol/L 136  138    Potassium 3.5 - 5.1 mmol/L 4.1  4.5    Chloride 98 - 111 mmol/L 101  101    CO2 22 - 32 mmol/L 25  30    Calcium 8.9 - 10.3 mg/dL 62.1  9.6    Total Protein 6.5 - 8.1 g/dL 8.4  7.9    Total Bilirubin <1.2 mg/dL 0.4  0.7    Alkaline Phos 38 - 126 U/L 67  88    AST 15 - 41 U/L 15  11    ALT 0 - 44 U/L 23  12        RADIOGRAPHIC STUDIES: I have personally reviewed the radiological images as listed and agreed with the findings in the report. No results found.    No orders of the defined types were placed in this encounter.  All questions were answered. The patient knows to call the clinic with any problems, questions or concerns. No barriers to learning was detected. The total time spent in the appointment was 20 minutes.     Malachy Mood, MD 06/07/2023

## 2023-06-08 LAB — CHROMOGRANIN A: Chromogranin A (ng/mL): 85.3 ng/mL (ref 0.0–101.8)

## 2023-06-11 ENCOUNTER — Encounter: Payer: Self-pay | Admitting: Family Medicine

## 2023-06-11 DIAGNOSIS — R7303 Prediabetes: Secondary | ICD-10-CM

## 2023-06-11 DIAGNOSIS — I10 Essential (primary) hypertension: Secondary | ICD-10-CM

## 2023-06-11 DIAGNOSIS — M1711 Unilateral primary osteoarthritis, right knee: Secondary | ICD-10-CM

## 2023-06-11 DIAGNOSIS — I1A0 Resistant hypertension: Secondary | ICD-10-CM

## 2023-06-11 DIAGNOSIS — E782 Mixed hyperlipidemia: Secondary | ICD-10-CM

## 2023-06-11 MED ORDER — AMLODIPINE BESYLATE 10 MG PO TABS
10.0000 mg | ORAL_TABLET | Freq: Every day | ORAL | 0 refills | Status: DC
Start: 2023-06-11 — End: 2023-09-17

## 2023-06-11 MED ORDER — ATORVASTATIN CALCIUM 40 MG PO TABS: 40.0000 mg | ORAL_TABLET | Freq: Every day | ORAL | 0 refills | Status: DC

## 2023-06-11 MED ORDER — DICLOFENAC SODIUM 75 MG PO TBEC: 75.0000 mg | DELAYED_RELEASE_TABLET | Freq: Two times a day (BID) | ORAL | 0 refills | Status: DC | PRN

## 2023-06-11 MED ORDER — METFORMIN HCL 500 MG PO TABS: 500.0000 mg | ORAL_TABLET | Freq: Every day | ORAL | 0 refills | Status: DC

## 2023-06-11 MED ORDER — CHLORTHALIDONE 25 MG PO TABS: 25.0000 mg | ORAL_TABLET | Freq: Every day | ORAL | 0 refills | Status: DC

## 2023-06-11 MED ORDER — VALSARTAN 320 MG PO TABS: 320.0000 mg | ORAL_TABLET | Freq: Every day | ORAL | 0 refills | Status: DC

## 2023-06-11 MED ORDER — CARVEDILOL 25 MG PO TABS: 25.0000 mg | ORAL_TABLET | Freq: Two times a day (BID) | ORAL | 0 refills | Status: DC

## 2023-06-18 ENCOUNTER — Encounter: Payer: Self-pay | Admitting: Family Medicine

## 2023-06-18 ENCOUNTER — Ambulatory Visit (INDEPENDENT_AMBULATORY_CARE_PROVIDER_SITE_OTHER): Payer: Self-pay | Admitting: Family Medicine

## 2023-06-18 VITALS — BP 118/76 | HR 89 | Temp 98.2°F | Ht 69.0 in | Wt 244.2 lb

## 2023-06-18 DIAGNOSIS — R350 Frequency of micturition: Secondary | ICD-10-CM

## 2023-06-18 DIAGNOSIS — R972 Elevated prostate specific antigen [PSA]: Secondary | ICD-10-CM | POA: Insufficient documentation

## 2023-06-18 DIAGNOSIS — E119 Type 2 diabetes mellitus without complications: Secondary | ICD-10-CM

## 2023-06-18 DIAGNOSIS — F640 Transsexualism: Secondary | ICD-10-CM | POA: Insufficient documentation

## 2023-06-18 DIAGNOSIS — I1A0 Resistant hypertension: Secondary | ICD-10-CM

## 2023-06-18 DIAGNOSIS — Z7984 Long term (current) use of oral hypoglycemic drugs: Secondary | ICD-10-CM

## 2023-06-18 DIAGNOSIS — D3A011 Benign carcinoid tumor of the jejunum: Secondary | ICD-10-CM

## 2023-06-18 LAB — LIPID PANEL
Cholesterol: 153 mg/dL (ref 0–200)
HDL: 38.3 mg/dL — ABNORMAL LOW (ref >39.00)
LDL Cholesterol: 85 mg/dL (ref 0–99)
NonHDL: 114.62
Total CHOL/HDL Ratio: 4
Triglycerides: 149 mg/dL (ref 0.0–149.0)
VLDL: 29.8 mg/dL (ref 0.0–40.0)

## 2023-06-18 LAB — GLUCOSE, RANDOM: Glucose, Bld: 147 mg/dL — ABNORMAL HIGH (ref 70–99)

## 2023-06-18 LAB — PSA: PSA: 29.77 ng/mL — ABNORMAL HIGH (ref 0.10–4.00)

## 2023-06-18 LAB — HEMOGLOBIN A1C: Hgb A1c MFr Bld: 6.9 % — ABNORMAL HIGH (ref 4.6–6.5)

## 2023-06-18 NOTE — Assessment & Plan Note (Signed)
We will check an A1c today. I ant to make sure recent weight loss is not due to loss of diabetes control. Continue metformin 500 mg daily.

## 2023-06-18 NOTE — Assessment & Plan Note (Addendum)
I will refer Dwayne Sawyer to endocrinology to discuss resuming hormone therapy.

## 2023-06-18 NOTE — Assessment & Plan Note (Signed)
Blood pressure is at goal today. This has likely been helped by his recent weight loss. Continue amlodipine 10 mg daily, carvedilol 25 mg bid, valsartan 320 mg daily and chlorthalidone 25 mg daily.

## 2023-06-18 NOTE — Addendum Note (Signed)
Addended by: Loyola Mast on: 06/18/2023 05:10 PM   Modules accepted: Orders

## 2023-06-18 NOTE — Assessment & Plan Note (Signed)
Remains in remission. Continue lantreotide for symptom management.

## 2023-06-18 NOTE — Progress Notes (Signed)
[facility] [address] [place] [address] Dept: [phone] Dept Fax: [phone]  Chronic Care Office Visit  Subjective:    Patient ID: Dwayne Sawyer, adult    DOB: 14-May-1971, 52 y.o..   MRN: [id number]  Chief Complaint  Patient presents with   Follow-up    F/u.  C/o having urine frequency x 2 days.    History of Present Illness:  Patient is in today for reassessment of chronic medical issues.  [name]  has a history of resistant hypertension. He is currently managed on amlodipine 10 mg daily, carvedilol 25 mg bid, valsartan 320 mg daily and chlorthalidone 25 mg daily.    [name] has a history of hyperlipidemia. He is  managed on atorvastatin 40 mg daily.   [name] has a history of Type 2 diabetes. He is managed on metformin 500 mg daily. 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 He is currently in remission. He is feeling well overall. He continues lanreotide for management of diarrhea.    [name] identifies as non-binary gender. He has previously been on estrogen and progesterone, which he received through a web-based medical source. He felt the hormones helped him related to his connection with his feminine side. He had gone off of these medications last year. He is now interested in seeing someone locally related to restarting these. He has no interest in transitioning M2F.  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   Erectile dysfunction 11/21/2022   Morbid obesity, associated with hypertension, hyperlipidemia, and osteoarthritis (HCC) 09/15/2021   Status post total right knee replacement 09/07/2021   BMI 37.0-37.9, adult 12/10/2020   Obstructive sleep apnea syndrome, severe  11/29/2020   Hyperlipidemia 09/06/2020   Resistant hypertension 09/06/2020   Type 2 diabetes mellitus (HCC) 09/06/2020   Carcinoid tumor of small intestine 06/13/2019   Rheumatoid arthritis involving multiple sites with positive rheumatoid factor (HCC) 10/13/2015   Liver lesion 10/13/2015   Past Surgical History:  Procedure Laterality Date   EYE SURGERY Right    pt says we he was a baby surgery had to be done on one eye "because it wasn't as strong as the other"   HERNIA REPAIR  1988   inguinal- pt doesn't remember which side   LAPAROTOMY N/A 06/12/2019   Procedure: EXPLORATORY LAPAROTOMY WITH SMALL BOWEL RESECTION;  Surgeon: Luretha Murphy, MD;  Location: [facility];  Service: General;  Laterality: N/A;   SHOULDER SURGERY Right 2001   rotator cuff repair   SMALL INTESTINE SURGERY  06/12/2019   part of ex lap procedure. pt states "3 feet of intestine was removed"   TONSILLECTOMY     as a child   TOTAL KNEE ARTHROPLASTY Right 09/07/2021   Procedure: RIGHT TOTAL KNEE ARTHROPLASTY;  Surgeon: Tarry Kos, MD;  Location: [facility] OR;  Service: Orthopedics;  Laterality: Right;   Family History  Problem Relation Age of Onset   Cancer Father        widespread carcinoid cancer   Hypertension Mother    Hyperlipidemia Mother    Outpatient Medications Prior to Visit  Medication Sig Dispense Refill   amLODipine (NORVASC) 10 MG tablet Take 1 tablet (10 mg total) by mouth daily. 90 tablet 0   aspirin  EC 81 MG tablet Take 1 tablet (81 mg total) by mouth 2 (two) times daily. To be taken after surgery 84 tablet 0   atorvastatin (LIPITOR) 40 MG tablet Take 1 tablet (40 mg total) by mouth daily. 90 tablet 0   carvedilol (COREG) 25 MG tablet Take 1 tablet (25 mg total) by mouth 2 (two) times daily with a meal. 180 tablet 0   chlorthalidone (HYGROTON) 25 MG tablet Take 1 tablet (25 mg total) by mouth daily. 90 tablet 0   diclofenac (VOLTAREN) 75 MG EC tablet Take 1 tablet (75 mg total) by mouth 2 (two)  times daily as needed. 60 tablet 0   LANREOTIDE ACETATE [place] Inject 120 mg into the skin daily.     metFORMIN (GLUCOPHAGE) 500 MG tablet Take 1 tablet (500 mg total) by mouth daily with breakfast. 90 tablet 0   tadalafil (CIALIS) 20 MG tablet Take 0.5-1 tablets (10-20 mg total) by mouth daily as needed for erectile dysfunction. 10 tablet 11   tamsulosin (FLOMAX) 0.4 MG CAPS capsule Take 1 capsule (0.4 mg total) by mouth daily. 90 capsule 3   valsartan (DIOVAN) 320 MG tablet Take 1 tablet (320 mg total) by mouth daily. 90 tablet 0   No facility-administered medications prior to visit.   No Known Allergies Objective:   Today's Vitals   06/18/23 1414  BP: 118/76  Pulse: 89  Temp: 98.2 F (36.8 C)  TempSrc: Temporal  SpO2: 97%  Weight: 244 lb 3.2 oz (110.8 kg)  Height: 5\' 9"  (1.753 m)   Body mass index is 36.06 kg/m.   General: Well developed, well nourished. No acute distress. Psych: Alert and oriented. Normal mood and affect.  Health Maintenance Due  Topic Date Due   OPHTHALMOLOGY EXAM  11/23/2022   HEMOGLOBIN A1C  05/23/2023   Colonoscopy  07/25/2023     Assessment & Plan:   Problem List Items Addressed This Visit       Cardiovascular and Mediastinum   Resistant hypertension - Primary    Blood pressure is at goal today. This has likely been helped by his recent weight loss. Continue amlodipine 10 mg daily, carvedilol 25 mg bid, valsartan 320 mg daily and chlorthalidone 25 mg daily.        Digestive   Carcinoid tumor of small intestine    Remains in remission. Continue lantreotide for symptom management.        Endocrine   Type 2 diabetes mellitus (HCC)    We will check an A1c today. I ant to make sure recent weight loss is not due to loss of diabetes control. Continue metformin 500 mg daily.      Relevant Orders   Glucose, random   Hemoglobin A1c   Lipid panel     Other   Non-binary gender    I will refer Mr. Isgro to endocrinology to discuss resuming  hormone therapy.      Relevant Orders   Ambulatory referral to Endocrinology   Other Visit Diagnoses     Urinary frequency       I will reassess blood sugar to assure that diabetes remains in control.   Relevant Orders   Glucose, random   Hemoglobin A1c   PSA       Return in about 3 months (around 09/18/2023) for Reassessment.   Loyola Mast, MD

## 2023-06-19 NOTE — Addendum Note (Signed)
Addended by: Loyola Mast on: 06/19/2023 05:17 PM   Modules accepted: Orders

## 2023-06-27 ENCOUNTER — Ambulatory Visit (HOSPITAL_COMMUNITY)
Admission: RE | Admit: 2023-06-27 | Discharge: 2023-06-27 | Disposition: A | Discharge status: Home / Self Care | Payer: Self-pay | Source: Ambulatory Visit | Attending: Hematology | Admitting: Hematology

## 2023-06-27 DIAGNOSIS — D3A011 Benign carcinoid tumor of the jejunum: Secondary | ICD-10-CM | POA: Insufficient documentation

## 2023-06-27 MED ORDER — IOHEXOL 300 MG/ML  SOLN
100.0000 mL | Freq: Once | INTRAMUSCULAR | Status: AC | PRN
  Administered 2023-06-27: 100 mL via INTRAVENOUS

## 2023-07-01 ENCOUNTER — Other Ambulatory Visit: Payer: Self-pay | Admitting: Hematology

## 2023-07-02 ENCOUNTER — Telehealth: Payer: Self-pay | Admitting: Hematology

## 2023-07-03 ENCOUNTER — Ambulatory Visit (INDEPENDENT_AMBULATORY_CARE_PROVIDER_SITE_OTHER): Payer: Self-pay | Admitting: Urology

## 2023-07-03 ENCOUNTER — Encounter: Payer: Self-pay | Admitting: Urology

## 2023-07-03 VITALS — BP 118/79 | HR 76 | Ht 69.0 in | Wt 239.0 lb

## 2023-07-03 DIAGNOSIS — R972 Elevated prostate specific antigen [PSA]: Secondary | ICD-10-CM

## 2023-07-03 NOTE — Assessment & Plan Note (Signed)
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 Pathology confirmed well differentiated neuroendocrine tumor, with metastasis to 14 of 32 LNs. Margins clear.  -07/16/19 PET shows two right hepatic lesion which are intensely hypermetabolic consistent with metastatic NET. There is also a smaller less avid left hepatic lesion that is concerning for metastasis which was not seen on CT from 06/11/19. No other evidence of mets -His MRI abdomen from 08/04/19 shows 3 liver lesions (0.8-4.4cm) compatible with neuroendocrine metastatic lesions.  -He was seen by Dr. Donell Beers and she did not recommend surgery due to the multiple liver mets.  -He was on monthly Lanreotide injections since 08/01/19. He was found to have progression on 05/31/20 DOTATATE PET scan, and received 4 doses of Lutathera 08/17/20 - 03/17/21. -restaging DOTATATE PET on 04/28/21 showed positive response to treatment. We discontinued his lanreotide injections after 05/13/21. -surveillance CT CAP on 04/18/22 showed stable disease -Due to persistent diarrhea, he restarted lanreotide injection in September 2023,  diarrhea has resolved.  Will continue monthly. -Restaging CT scan in April 2024 showed overall stable disease, and improved liver metastasis.  -Repeated CT scan from June 24, 2023 was done without IV contrast, his liver metastasis felt to be improved, however he has developed multiple pelvic adenopathy.  I will obtain a dotatate PET scan for further evaluation.  Given his recently elevated PSA, metastatic prostate cancer to pelvic lymph nodes is also a possibility.

## 2023-07-03 NOTE — Addendum Note (Signed)
Addended by: Carolin Coy on: 07/03/2023 03:38 PM   Modules accepted: Orders

## 2023-07-03 NOTE — Progress Notes (Signed)
Assessment: 1. Elevated PSA      Plan: Today I had a long discussion with the patient and his wife regarding his markedly elevated PSA, pelvic adenopathy as well as abnormal DRE concerning for prostate cancer.  I have recommended further evaluation with transrectal ultrasound and prostate biopsy.  Nature procedure discussed in detail including potential adverse events and complications especially emphasizing risk for infection.  Patient agrees to proceed.  Will schedule soon as possible.  Chief Complaint: elevated psa  History of Present Illness:  Dwayne Sawyer is a 52 y.o. adult who is seen in consultation from Loyola Mast, MD for evaluation of elevated PSA. Past medical history is remarkable for hypertension on multiple drugs, hyperlipidemia, type 2 diabetes, and metastatic carcinoid involving the small bowel and liver currently in remission.  Patient also is nonbinary gender previously on estrogen and progesterone which he obtained from an online source.  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  He went off these meds last year.  He is interested in restarting but has no interest in transitioning.  PSA 06/2023 = 29.77  CT abdomen pelvis demonstrates Progressive pelvic adenopathy suspicious for nodal mets    Past Medical History:  Past Medical History:  Diagnosis Date   Arthritis    Cancer (HCC) 2020   liver   Diabetes mellitus without complication (HCC)    type 2- pt was told he was pre-diabetic   Family history of carcinoid tumor    High cholesterol    Hypertension     Past Surgical History:  Past Surgical History:  Procedure Laterality Date   EYE SURGERY Right    pt says we he was a baby surgery had to be done on one eye "because it wasn't as strong as the other"   HERNIA REPAIR  1988   inguinal- pt doesn't remember which side   LAPAROTOMY N/A 06/12/2019   Procedure: EXPLORATORY LAPAROTOMY WITH SMALL BOWEL RESECTION;  Surgeon: Luretha Murphy, MD;   Location: [facility];  Service: General;  Laterality: N/A;   SHOULDER SURGERY Right 2001   rotator cuff repair   SMALL INTESTINE SURGERY  06/12/2019   part of ex lap procedure. pt states "3 feet of intestine was removed"   TONSILLECTOMY     as a child   TOTAL KNEE ARTHROPLASTY Right 09/07/2021   Procedure: RIGHT TOTAL KNEE ARTHROPLASTY;  Surgeon: Tarry Kos, MD;  Location: [facility];  Service: Orthopedics;  Laterality: Right;    Allergies:  No Known Allergies  Family History:  Family History  Problem Relation Age of Onset   Cancer Father        widespread carcinoid cancer   Hypertension Mother    Hyperlipidemia Mother     Social History:  Social History   Tobacco Use   Smoking status: Never   Smokeless tobacco: Former    Types: Chew    Quit date: 2020  Vaping Use   Vaping status: Never Used  Substance Use Topics   Alcohol use: Yes    Comment: very occasionally (less than a 6 pack of beer a year)   Drug use: Never    Review of symptoms:  Constitutional:  Negative for unexplained weight loss, night sweats, fever, chills ENT:  Negative for nose bleeds, sinus pain, painful swallowing CV:  Negative for chest pain, shortness of breath, exercise intolerance, palpitations, loss of consciousness Resp:  Negative for cough, wheezing, shortness of breath GI:  Negative for nausea, vomiting, diarrhea, bloody  stools GU:  Positives noted in HPI; otherwise negative for gross hematuria, dysuria, urinary incontinence Neuro:  Negative for seizures, poor balance, limb weakness, slurred speech Psych:  Negative for lack of energy, depression, anxiety Endocrine:  Negative for polydipsia, polyuria, symptoms of hypoglycemia (dizziness, hunger, sweating) Hematologic:  Negative for anemia, purpura, petechia, prolonged or excessive bleeding, use of anticoagulants  Allergic:  Negative for difficulty breathing or choking as a result of exposure to anything; no shellfish allergy; no allergic response  (rash/itch) to materials, foods  Physical exam: Vitals:   07/03/23 1330  BP: 118/79  Pulse: 76    GENERAL APPEARANCE:  Well appearing, well developed, well nourished, NAD  DRE: Normal sphincter tone; prostate volume 30 to 40 g with right sided nodularity Suspicious for prostate cancer.

## 2023-07-03 NOTE — Addendum Note (Signed)
Addended by: Carolin Coy on: 07/03/2023 03:47 PM   Modules accepted: Orders

## 2023-07-04 ENCOUNTER — Other Ambulatory Visit: Payer: Self-pay | Admitting: *Deleted

## 2023-07-04 ENCOUNTER — Inpatient Hospital Stay (HOSPITAL_BASED_OUTPATIENT_CLINIC_OR_DEPARTMENT_OTHER): Payer: Self-pay | Admitting: Hematology

## 2023-07-04 ENCOUNTER — Encounter: Payer: Self-pay | Admitting: Hematology

## 2023-07-04 ENCOUNTER — Inpatient Hospital Stay: Payer: Self-pay | Attending: Hematology

## 2023-07-04 ENCOUNTER — Inpatient Hospital Stay: Payer: Self-pay

## 2023-07-04 VITALS — BP 129/83 | HR 80 | Temp 98.0°F | Resp 16 | Wt 249.5 lb

## 2023-07-04 DIAGNOSIS — C7A8 Other malignant neuroendocrine tumors: Secondary | ICD-10-CM | POA: Insufficient documentation

## 2023-07-04 DIAGNOSIS — D3A098 Benign carcinoid tumors of other sites: Secondary | ICD-10-CM

## 2023-07-04 DIAGNOSIS — D3A011 Benign carcinoid tumor of the jejunum: Secondary | ICD-10-CM

## 2023-07-04 DIAGNOSIS — C7A011 Malignant carcinoid tumor of the jejunum: Secondary | ICD-10-CM

## 2023-07-04 DIAGNOSIS — C7B8 Other secondary neuroendocrine tumors: Secondary | ICD-10-CM | POA: Insufficient documentation

## 2023-07-04 LAB — CMP (CANCER CENTER ONLY)
ALT: 15 U/L (ref 0–44)
AST: 12 U/L — ABNORMAL LOW (ref 15–41)
Albumin: 4.3 g/dL (ref 3.5–5.0)
Alkaline Phosphatase: 79 U/L (ref 38–126)
Anion gap: 6 (ref 5–15)
BUN: 23 mg/dL — ABNORMAL HIGH (ref 6–20)
CO2: 32 mmol/L (ref 22–32)
Calcium: 9.5 mg/dL (ref 8.9–10.3)
Chloride: 102 mmol/L (ref 98–111)
Creatinine: 1.59 mg/dL — ABNORMAL HIGH (ref 0.61–1.24)
GFR, Estimated: 51 mL/min — ABNORMAL LOW (ref >60)
Glucose, Bld: 172 mg/dL — ABNORMAL HIGH (ref 70–99)
Potassium: 4.3 mmol/L (ref 3.5–5.1)
Sodium: 140 mmol/L (ref 135–145)
Total Bilirubin: 0.6 mg/dL (ref <1.2)
Total Protein: 7.5 g/dL (ref 6.5–8.1)

## 2023-07-04 LAB — CBC WITH DIFFERENTIAL (CANCER CENTER ONLY)
Abs Immature Granulocytes: 0.02 10*3/uL (ref 0.00–0.07)
Basophils Absolute: 0.1 10*3/uL (ref 0.0–0.1)
Basophils Relative: 1 %
Eosinophils Absolute: 0.2 10*3/uL (ref 0.0–0.5)
Eosinophils Relative: 3 %
HCT: 38.5 % — ABNORMAL LOW (ref 39.0–52.0)
Hemoglobin: 12.6 g/dL — ABNORMAL LOW (ref 13.0–17.0)
Immature Granulocytes: 0 %
Lymphocytes Relative: 14 %
Lymphs Abs: 1.1 10*3/uL (ref 0.7–4.0)
MCH: 30.3 pg (ref 26.0–34.0)
MCHC: 32.7 g/dL (ref 30.0–36.0)
MCV: 92.5 fL (ref 80.0–100.0)
Monocytes Absolute: 0.6 10*3/uL (ref 0.1–1.0)
Monocytes Relative: 8 %
Neutro Abs: 5.4 10*3/uL (ref 1.7–7.7)
Neutrophils Relative %: 74 %
Platelet Count: 380 10*3/uL (ref 150–400)
RBC: 4.16 MIL/uL — ABNORMAL LOW (ref 4.22–5.81)
RDW: 14.1 % (ref 11.5–15.5)
WBC Count: 7.4 10*3/uL (ref 4.0–10.5)
nRBC: 0 % (ref 0.0–0.2)

## 2023-07-04 MED ORDER — LANREOTIDE ACETATE 120 MG/0.5ML ~~LOC~~ SOLN
120.0000 mg | Freq: Once | SUBCUTANEOUS | Status: AC
Start: 2023-07-04 — End: 2023-07-04
  Administered 2023-07-04: 120 mg via SUBCUTANEOUS
  Filled 2023-07-04: qty 120

## 2023-07-04 NOTE — Progress Notes (Signed)
[facility]   [phone] Fax:([phone]   Clinic Follow up Note   Patient Care Team: Loyola Mast, MD as PCP - General (Family Medicine) Rodolph Bong, MD as Consulting Physician (Sports Medicine) Malachy Mood, MD as Consulting Physician (Hematology) Tarry Kos, MD as Attending Physician (Orthopedic Surgery) Chilton Si, MD as Attending Physician (Cardiology) [facility] as Attending Physician (Radiology) Rodolph Bong, MD as Consulting Physician (Sports Medicine)  Date of Service:  07/04/2023  CHIEF COMPLAINT: f/u of CT scan result  CURRENT THERAPY:  Lanreotide injection every month  Oncology History   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 Pathology confirmed well differentiated neuroendocrine tumor, with metastasis to 14 of 32 LNs. Margins clear.  -07/16/19 PET shows two right hepatic lesion which are intensely hypermetabolic consistent with metastatic NET. There is also a smaller less avid left hepatic lesion that is concerning for metastasis which was not seen on CT from 06/11/19. No other evidence of mets -His MRI abdomen from 08/04/19 shows 3 liver lesions (0.8-4.4cm) compatible with neuroendocrine metastatic lesions.  -He was seen by Dr. Donell Beers and she did not recommend surgery due to the multiple liver mets.  -He was on monthly Lanreotide injections since 08/01/19. He was found to have progression on 05/31/20 DOTATATE PET scan, and received 4 doses of Lutathera 08/17/20 - 03/17/21. -restaging DOTATATE PET on 04/28/21 showed positive response to treatment. We discontinued his lanreotide injections after 05/13/21. -surveillance CT CAP on 04/18/22 showed stable disease -Due to persistent diarrhea, he restarted lanreotide injection in September 2023,   diarrhea has resolved.  Will continue monthly. -Restaging CT scan in April 2024 showed overall stable disease, and improved liver metastasis.  -Repeated CT scan from June 24, 2023 was done without IV contrast, his liver metastasis felt to be improved, however he has developed multiple pelvic adenopathy.  I will obtain a dotatate PET scan for further evaluation.  Given his recently elevated PSA, metastatic prostate cancer to pelvic lymph nodes is also a possibility.     Assessment and Plan    Neuroendocrine Tumor Neuroendocrine tumor and associated liver lesion are well-managed based on the latest CT scan and tumor markers.  However CT scan showed new enlarged pelvic adenopathy, concerning for malignancy.  If PSMA scan does not detect lymph nodes, a DOTATE PET scan may be needed to determine lymph node involvement, potentially altering treatment. - Continue current injection therapy - Monitor tumor markers - Review CT scan results in four weeks  Prostate Cancer (Suspected) Elevated PSA and palpable prostate nodules suggest high likelihood of prostate cancer. Biopsy scheduled for confirmation. CT scan showed new lymph nodes in the retroperitoneum, possibly related to prostate cancer. PSMA PET scan will help differentiate lymph node involvement and avoid unnecessary biopsies. - Proceed with prostate biopsy next week - If biopsy confirms prostate cancer, will consider ordering PSMA PET scan to assess lymph node involvement - Discuss potential treatment options with urology and radiation oncology post-biopsy  Back Pain Back pain from the buttocks to T12 region may be related to prostate cancer or lymph nodes. Currently using Flomax for urinary symptoms. - Continue using Flomax as needed - Monitor back pain and reassess after biopsy results  Plan -Restaging CT scan reviewed, which showed new enlarged pelvic adenopathy, concerning for metastatic disease from prostate cancer, versus  neuroendocrine tumor - Follow up on January 8th for injection and review of biopsy results -Will reach out to his urologist Dr. Margo Aye to see if he needs PSMA scan if prostate biopsy confirmed prostate cancer     SUMMARY OF ONCOLOGIC HISTORY: Oncology History Overview Note  Cancer Staging No matching staging information was found for the patient.    Carcinoid tumor of small intestine  06/11/2019 Imaging   US Abdomen 06/11/19  IMPRESSION: 1. There is a solid mass in the right lobe of the liver which based on current measurements may have enlarged since the prior study from 2015. Images from the previous MR at that time cannot be retrieved. Given this circumstance, it may be prudent to correlate with pre and serial post-contrast MR or CT of the liver to further evaluate. There is underlying diffuse increase in liver echogenicity, a finding indicative of hepatic steatosis.   2. Multiple loops of fluid-filled bowel. Question a degree of ileus or enteritis.   3. Study otherwise unremarkable. Note that much of the common bile duct is obscured by gas.    06/11/2019 Imaging   CT AP W Contrast 06/11/19   IMPRESSION: 1. High-grade small bowel obstruction secondary to desmoplastic response in the central right abdominal mesentery centered on a 4 cm calcified irregular/spiculated soft tissue lesion. Abrupt small bowel transition zone is identified immediately lead adjacent to this mesenteric lesion and multiple adjacent bowel loops are tethered into this region with mesenteric edema/congestion. The loop of small bowel immediately proximal to the transition zone shows mild circumferential wall thickening but no pneumatosis. 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 2. Small lymph nodes in the abnormal right mesentery suggest additional metastatic involvement. 3. Heterogeneous liver parenchyma, likely secondary to  geographic fatty deposition. There is a focal 14 mm low-density lesion in the right liver. The patient had an MRI in the [facility] system on 08/09/2013 to evaluate a right liver lesion, but those images are not available. Follow-up MRI without and with contrast recommended to exclude metastatic disease. 4. Tiny sclerotic foci in the T12 vertebral body in both femoral heads are likely benign, but close attention on follow-up recommended. 5.  Aortic Atherosclerois (ICD10-170.0)      06/12/2019 Surgery    EXPLORATORY LAPAROTOMY WITH SMALL BOWEL RESECTION by Dr. 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 Interval resection small bowel and mesenteric mass with no evidence residual mesenteric or small bowel neuroendocrine tumor. 2. Large lesion in RIGHT hepatic lobe with intense radiotracer activity consistent with well differentiated neuroendocrine tumor metastasis. Smaller lesion in the anterior LEFT hepatic lobe is concerning for second hepatic metastasis. 3. Pulmonary nodule measuring 6 mm in LEFT upper lobe is not have radiotracer activity. Smaller RIGHT upper lobe nodule. Recommend close attention on follow-up.   08/01/2019 - 05/07/2020 Chemotherapy   Lanreotide injection monthly starting 08/01/19. Stopped after 05/07/20 due to disease progression     08/04/2019 Imaging  MRI abdomen  IMPRESSION: 1. The previously noted lesion in the right lobe of  the liver has grown slightly compared to 2015. In addition, today's study demonstrates 2 smaller lesions with similar imaging characteristics. Given the activity on the recent PET Dotatate scan, these lesions are all compatible with small neuroendocrine metastatic lesions. 2. Hepatic steatosis.   02/10/2020 Imaging   MRI abdomen  IMPRESSION: 1. The dominant right hepatic lobe mass has mildly increased in size compared to the prior exam. This currently measures 5.4 by 4.5 cm, previously 4.9 by 4.2 cm. 2. Nine additional small T2 hyperintense foci in the liver are stable, and while the small size makes these less specific, there likely small metastatic lesions. Today's exam has the benefit of less motion artifact compared to the 06/03/2020 MRI, and I was able to pick at each of these tiny lesions on the prior exam in retrospect. It is conceivable that 1 or more of these tiny lesions could represent small hemangiomas, although I do not observe classic delayed enhancement pattern. 3. Diffuse hepatic steatosis.   05/31/2020 PET scan   DOTATATE PET  IMPRESSION: 1. Progression of well differentiated neuroendocrine tumor hepatic metastasis with increase in size and radiotracer activity of dominant lesion in the LEFT hepatic lobe and multiple small lesions as described above. 2. New small focus of radiotracer activity within the head of the pancreas. 3. Increased hepatic steatosis.      Chemotherapy   Lutathera treatments on 08/11/20, 10/06/20, 12/01/20, 01/26/21      04/28/2021 PET scan   NETSPOT GA 68 DOTATATE  IMPRESSION: 1. Interval decrease in radiotracer activity of neuroendocrine tumor hepatic metastasis. Dominant lesion the RIGHT hepatic lobe is mildly in size. Several small peripheral lesions now have no radiotracer activity. Lesions are conspicuous in liver on noncontrast exam due to hepatic steatosis. 2. Small lesion in the head of the pancreas ahs decreased radiotracer  activity. Lesion difficult to define on CT portion exam. 3. No evidence new metastatic neuroendocrine tumor. 4. Post RIGHT hemicolectomy anatomy without complication 5. New bilateral symmetric gynecomastia.      Discussed the use of AI scribe software for clinical note transcription with the patient, who gave verbal consent to proceed.  History of Present Illness   The patient, a 52 year old Sawyer with a history of neuroendocrine tumor, presents for a routine follow-up. He reports feeling generally well, but expresses nervousness about recent health developments. He has been experiencing back pain, fatigue, and frequent urination without feeling completely evacuated. These symptoms prompted his primary care physician to run a PSA test, which returned a high result. The patient has already seen a urologist, Dr. Margo Aye, who has scheduled a biopsy for the following week due to the high PSA and the presence of nodules felt during a physical examination.  In addition to these concerns, a recent CT scan for the neuroendocrine tumor follow-up revealed new lymph nodes in the back of the stomach. The patient is currently receiving injections for the neuroendocrine tumor, and his tumor markers have been normal. The liver lesions related to the neuroendocrine tumor have remained stable.         All other systems were reviewed with the patient and are negative.  MEDICAL HISTORY:  Past Medical History:  Diagnosis Date   Arthritis    Cancer (HCC) 2020   liver   Diabetes mellitus without complication (HCC)    type 2- pt was told he was pre-diabetic   Family history of carcinoid tumor  High cholesterol    Hypertension     SURGICAL HISTORY: Past Surgical History:  Procedure Laterality Date   EYE SURGERY Right    pt says we he was a baby surgery had to be done on one eye "because it wasn't as strong as the other"   HERNIA REPAIR  1988   inguinal- pt doesn't remember which side   LAPAROTOMY N/A  06/12/2019   Procedure: EXPLORATORY LAPAROTOMY WITH SMALL BOWEL RESECTION;  Surgeon: Luretha Murphy, MD;  Location: [facility];  Service: General;  Laterality: N/A;   SHOULDER SURGERY Right 2001   rotator cuff repair   SMALL INTESTINE SURGERY  06/12/2019   part of ex lap procedure. pt states "3 feet of intestine was removed"   TONSILLECTOMY     as a child   TOTAL KNEE ARTHROPLASTY Right 09/07/2021   Procedure: RIGHT TOTAL KNEE ARTHROPLASTY;  Surgeon: Tarry Kos, MD;  Location: [facility];  Service: Orthopedics;  Laterality: Right;    I have reviewed the social history and family history with the patient and they are unchanged from previous note.  ALLERGIES:  has No Known Allergies.  MEDICATIONS:  Current Outpatient Medications  Medication Sig Dispense Refill   amLODipine (NORVASC) 10 MG tablet Take 1 tablet (10 mg total) by mouth daily. 90 tablet 0   aspirin EC 81 MG tablet Take 1 tablet (81 mg total) by mouth 2 (two) times daily. To be taken after surgery 84 tablet 0   atorvastatin (LIPITOR) 40 MG tablet Take 1 tablet (40 mg total) by mouth daily. 90 tablet 0   carvedilol (COREG) 25 MG tablet Take 1 tablet (25 mg total) by mouth 2 (two) times daily with a meal. 180 tablet 0   chlorthalidone (HYGROTON) 25 MG tablet Take 1 tablet (25 mg total) by mouth daily. 90 tablet 0   diclofenac (VOLTAREN) 75 MG EC tablet Take 1 tablet (75 mg total) by mouth 2 (two) times daily as needed. 60 tablet 0   LANREOTIDE ACETATE Isabela Inject 120 mg into the skin daily.     metFORMIN (GLUCOPHAGE) 500 MG tablet Take 1 tablet (500 mg total) by mouth daily with breakfast. 90 tablet 0   tadalafil (CIALIS) 20 MG tablet Take 0.5-1 tablets (10-20 mg total) by mouth daily as needed for erectile dysfunction. 10 tablet 11   tamsulosin (FLOMAX) 0.4 MG CAPS capsule Take 1 capsule (0.4 mg total) by mouth daily. 90 capsule 3   valsartan (DIOVAN) 320 MG tablet Take 1 tablet (320 mg total) by mouth daily. 90 tablet 0   No current  facility-administered medications for this visit.    PHYSICAL EXAMINATION: ECOG PERFORMANCE STATUS: 1 - Symptomatic but completely ambulatory  Vitals:   07/04/23 1036  BP: 129/83  Pulse: 80  Resp: 16  Temp: 98 F (36.7 C)  SpO2: 99%   Wt Readings from Last 3 Encounters:  07/04/23 249 lb 8 oz (113.2 kg)  07/03/23 239 lb (108.4 kg)  06/18/23 244 lb 3.2 oz (110.8 kg)     GENERAL:alert, no distress and comfortable SKIN: skin color, texture, turgor are normal, no rashes or significant lesions EYES: normal, Conjunctiva are pink and non-injected, sclera clear Musculoskeletal:no cyanosis of digits and no clubbing  NEURO: alert & oriented x 3 with fluent speech, no focal motor/sensory deficits      LABORATORY DATA:  I have reviewed the data as listed    Latest Ref Rng & Units 07/04/2023   10:11 AM 06/07/2023  1:13 PM 03/14/2023    9:08 AM  CBC  WBC 4.0 - 10.5 K/uL 7.4  19.7  7.9   Hemoglobin 13.0 - 17.0 g/dL 16.1  09.6  04.5   Hematocrit 39.0 - 52.0 % 38.5  40.6  45.1   Platelets 150 - 400 K/uL 380  322  299         Latest Ref Rng & Units 07/04/2023   10:11 AM 06/18/2023    2:40 PM 06/07/2023    1:13 PM  CMP  Glucose 70 - 99 mg/dL 409  811  914   BUN 6 - 20 mg/dL 23   36   Creatinine 7.82 - 1.24 mg/dL 9.56   2.13   Sodium 086 - 145 mmol/L 140   136   Potassium 3.5 - 5.1 mmol/L 4.3   4.1   Chloride 98 - 111 mmol/L 102   101   CO2 22 - 32 mmol/L 32   25   Calcium 8.9 - 10.3 mg/dL 9.5   57.8   Total Protein 6.5 - 8.1 g/dL 7.5   8.4   Total Bilirubin <1.2 mg/dL 0.6   0.4   Alkaline Phos 38 - 126 U/L 79   67   AST 15 - 41 U/L 12   15   ALT 0 - 44 U/L 15   23       RADIOGRAPHIC STUDIES: I have personally reviewed the radiological images as listed and agreed with the findings in the report. No results found.    No orders of the defined types were placed in this encounter.  All questions were answered. The patient knows to call the clinic with any problems,  questions or concerns. No barriers to learning was detected. The total time spent in the appointment was 30 minutes.     Malachy Mood, MD 07/04/2023

## 2023-07-05 ENCOUNTER — Telehealth: Payer: Self-pay

## 2023-07-05 LAB — CHROMOGRANIN A: Chromogranin A (ng/mL): 67.9 ng/mL (ref 0.0–101.8)

## 2023-07-05 NOTE — Telephone Encounter (Addendum)
Routed last office note to Dr. Marcha Solders Urologist as per Dr. Mosetta Putt.  ----- Message from Malachy Mood sent at 07/04/2023 10:38 PM EST ----- Please send my note to his urologist Dr. Margo Aye, thanks

## 2023-07-08 LAB — 5 HIAA, QUANTITATIVE, URINE, 24 HOUR
5-HIAA, Ur: 8.6 mg/L
5-HIAA,Quant.,24 Hr Urine: 7.7 mg/(24.h) (ref 0.0–14.9)
Total Volume: 900

## 2023-07-10 ENCOUNTER — Ambulatory Visit (HOSPITAL_BASED_OUTPATIENT_CLINIC_OR_DEPARTMENT_OTHER)
Admission: RE | Admit: 2023-07-10 | Discharge: 2023-07-10 | Disposition: A | Discharge status: Home / Self Care | Payer: Self-pay | Source: Ambulatory Visit | Attending: Urology | Admitting: Urology

## 2023-07-10 ENCOUNTER — Ambulatory Visit (INDEPENDENT_AMBULATORY_CARE_PROVIDER_SITE_OTHER): Payer: Self-pay | Admitting: Urology

## 2023-07-10 ENCOUNTER — Other Ambulatory Visit (HOSPITAL_BASED_OUTPATIENT_CLINIC_OR_DEPARTMENT_OTHER): Payer: Self-pay | Admitting: Urology

## 2023-07-10 VITALS — BP 91/66 | HR 66

## 2023-07-10 DIAGNOSIS — Z2989 Encounter for other specified prophylactic measures: Secondary | ICD-10-CM

## 2023-07-10 DIAGNOSIS — R972 Elevated prostate specific antigen [PSA]: Secondary | ICD-10-CM

## 2023-07-10 LAB — MICROSCOPIC EXAMINATION

## 2023-07-10 LAB — URINALYSIS, ROUTINE W REFLEX MICROSCOPIC
Bilirubin, UA: NEGATIVE
Glucose, UA: NEGATIVE
Ketones, UA: NEGATIVE
Nitrite, UA: NEGATIVE
Protein,UA: NEGATIVE
RBC, UA: NEGATIVE
Specific Gravity, UA: 1.02 (ref 1.005–1.030)
Urobilinogen, Ur: 0.2 mg/dL (ref 0.2–1.0)
pH, UA: 5.5 (ref 5.0–7.5)

## 2023-07-10 MED ORDER — CEFTRIAXONE SODIUM 1 G IJ SOLR
1.0000 g | Freq: Once | INTRAMUSCULAR | Status: AC
Start: 2023-07-10 — End: 2023-07-10
  Administered 2023-07-10: 1 g via INTRAMUSCULAR

## 2023-07-10 NOTE — Progress Notes (Addendum)
Assessment: 1. Elevated PSA     Plan: Per bx instructions FU 1 week to review path and further recommendations   ADDENDUM:  Suprisingly path was negative.  Called patient and left message with results.  Will see back in about 1 week to discuss further  Chief Complaint: Here for prostate biopsy  HPI: Dwayne Sawyer is a 52 y.o. adult who presents for planned transrectal ultrasound and prostate biopsy.  See my note 07/03/2023 at the time of initial visit for detailed history.  Patient has a markedly elevated PSA of approximately 30 with a CT demonstrating progressive pelvic adenopathy suspicious for nodal metastasis.  Patient has completed standard prep and received Rocephin 1 g IM prior to the procedure.   Portions of the above documentation were copied from a prior visit for review purposes only.  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)    type 2- pt was told he was pre-diabetic   Family history of carcinoid tumor    High cholesterol    Hypertension     PSH: Past Surgical History:  Procedure Laterality Date   EYE SURGERY Right    pt says we he was a baby surgery had to be done on one eye "because it wasn't as strong as the other"   HERNIA REPAIR  1988   inguinal- pt doesn't remember which side   LAPAROTOMY N/A 06/12/2019   Procedure: EXPLORATORY LAPAROTOMY WITH SMALL BOWEL RESECTION;  Surgeon: Luretha Murphy, MD;  Location: [facility];  Service: General;  Laterality: N/A;   SHOULDER SURGERY Right 2001   rotator cuff repair   SMALL INTESTINE SURGERY  06/12/2019   part of ex lap procedure. pt states "3 feet of intestine was removed"   TONSILLECTOMY     as a child   TOTAL KNEE ARTHROPLASTY Right 09/07/2021   Procedure: RIGHT TOTAL KNEE ARTHROPLASTY;  Surgeon: Tarry Kos, MD;  Location: [facility];  Service: Orthopedics;  Laterality: Right;     SH: Social History   Tobacco Use   Smoking status: Never   Smokeless tobacco: Former    Types: Chew    Quit date: 2020  Vaping Use   Vaping status: Never Used  Substance Use Topics   Alcohol use: Yes    Comment: very occasionally (less than a 6 pack of beer a year)   Drug use: Never    ROS: Constitutional:  Negative for fever, chills, weight loss CV: Negative for chest pain, previous MI, hypertension Respiratory:  Negative for shortness of breath, wheezing, sleep apnea, frequent cough GI:  Negative for nausea, vomiting, bloody stool, GERD  PE: There were no vitals taken for this visit. GENERAL APPEARANCE:  Well appearing, well developed, well nourished, NAD    TRANSRECTAL ULTRASOUND AND PROSTATE BIOPSY  Indication:  Elevated PSA and Abnl DRE  Prophylactic antibiotic administration: Rocephin  All medications that could result in increased bleeding were discontinued within an appropriate period of the time of biopsy.  Risk including bleeding and infection were discussed.  Informed consent was obtained.  Preprocedural timeout was performed.  The patient was placed in the left lateral decubitus position.  DRE: Normal sphincter tone; prostate is large approximately 60 g with bilateral induration-suspicious exam  PROCEDURE 1.  TRANSRECTAL ULTRASOUND OF THE PROSTATE  The 7 MHz transrectal probe was used to image the prostate.  Anal stenosis  was not noted.  TRUS volume: 70 ml  Hypoechoic areas: Bilateral apex to mid  Hyperechoic areas: None  Central calcifications: not present  Margins:  normal  Seminal Vesicles: normal   PROCEDURE 2:  PROSTATE BIOPSY  A periprostatic block was performed using 1% lidocaine and transrectal ultrasound guidance. Under transrectal ultrasound guidance, and using the Biopty gun, prostate biopsies were obtained systematically from the apex, mid gland, and base bilaterally.  A total of 12 cores were obtained.  Hemostasis was  obtained with gentle pressure on the prostate.  The procedures were well-tolerated.  No significant bleeding was noted at the end of the procedure.  The patient was stable for discharge from the office.

## 2023-07-10 NOTE — Progress Notes (Signed)
IM Injection  Patient is present today for an IM Injection for treatment of infection prevention post prostate biopsy Drug: Ceftriaxone Dose:1g Location:left glute Lot: [id number] Exp:01/2025 Patient tolerated well, no complications were noted  Performed by: Jayleene Glaeser N., CMA(AAMA)

## 2023-07-10 NOTE — Addendum Note (Signed)
Addended by: Carolin Coy on: 07/10/2023 12:25 PM   Modules accepted: Orders

## 2023-07-12 ENCOUNTER — Encounter: Payer: Self-pay | Admitting: Urology

## 2023-07-24 ENCOUNTER — Ambulatory Visit (INDEPENDENT_AMBULATORY_CARE_PROVIDER_SITE_OTHER): Payer: Self-pay | Admitting: Urology

## 2023-07-24 ENCOUNTER — Encounter: Payer: Self-pay | Admitting: Urology

## 2023-07-24 VITALS — BP 117/79 | HR 80

## 2023-07-24 DIAGNOSIS — R972 Elevated prostate specific antigen [PSA]: Secondary | ICD-10-CM

## 2023-07-24 NOTE — Progress Notes (Signed)
   Assessment: 1. Elevated PSA     Plan: Today I reviewed in detail the pathologic findings which showed no evidence of malignancy on his prostate biopsies.  As noted, his oncologist is setting him up for a specialized PET scan to see if his adenopathy is from his metastatic neuroendocrine tumor.  We will proceed with a follow-up multiparametric prostate MRI in approximately 8 weeks allowing time for the internal architecture to return to normal following the recent biopsy.  I will see him back following the MRI with a repeat PSA.  Chief Complaint: Elevated psa  HPI: Dwayne Sawyer is a 52 y.o. adult who presents for continued evaluation of elevated PSA. See my note 07/03/2023 at the time of initial visit for detailed history and exam and also note 07/10/2023 at the time of recent TRUS/BX. No complications as a result of the prostate biopsy Systematic prostate biopsy 06/2023 was negative for tumor.  He does have a large gland 70 g volume which is generally firm. PSA 06/2023 = 29.77 CT abdomen pelvis demonstrates Progressive pelvic adenopathy suspicious for nodal mets  Discussed findings with his oncologist Dr. Lanny-  he is scheduling a PET to see if the adenopathy is from his metastatic neuroendocrine tumor.  Portions of the above documentation were copied from a prior visit for review purposes only.  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)    type 2- pt was told he was pre-diabetic   Family history of carcinoid tumor    High cholesterol    Hypertension     PSH: Past Surgical History:  Procedure Laterality Date   EYE SURGERY Right    pt says we he was a baby surgery had to be done on one eye because it wasn't as strong as the other   HERNIA REPAIR  1988   inguinal- pt doesn't remember which side   LAPAROTOMY N/A 06/12/2019   Procedure:  EXPLORATORY LAPAROTOMY WITH SMALL BOWEL RESECTION;  Surgeon: Gladis Cough, MD;  Location: [facility];  Service: General;  Laterality: N/A;   SHOULDER SURGERY Right 2001   rotator cuff repair   SMALL INTESTINE SURGERY  06/12/2019   part of ex lap procedure. pt states 3 feet of intestine was removed   TONSILLECTOMY     as a child   TOTAL KNEE ARTHROPLASTY Right 09/07/2021   Procedure: RIGHT TOTAL KNEE ARTHROPLASTY;  Surgeon: Jerri Kay HERO, MD;  Location: [facility];  Service: Orthopedics;  Laterality: Right;    SH: Social History   Tobacco Use   Smoking status: Never   Smokeless tobacco: Former    Types: Chew    Quit date: 2020  Vaping Use   Vaping status: Never Used  Substance Use Topics   Alcohol use: Yes    Comment: very occasionally (less than a 6 pack of beer a year)   Drug use: Never    ROS: Constitutional:  Negative for fever, chills, weight loss CV: Negative for chest pain, previous MI, hypertension Respiratory:  Negative for shortness of breath, wheezing, sleep apnea, frequent cough GI:  Negative for nausea, vomiting, bloody stool, GERD  PE: BP 117/79   Pulse 80  GENERAL APPEARANCE:  Well appearing, well developed, well nourished, NAD

## 2023-07-24 NOTE — Addendum Note (Signed)
 Addended by: Carolin Coy on: 07/24/2023 11:52 AM   Modules accepted: Orders

## 2023-08-01 NOTE — Assessment & Plan Note (Deleted)
 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 Pathology confirmed well differentiated neuroendocrine tumor, with metastasis to 14 of 32 LNs. Margins clear.  -07/16/19 PET shows two right hepatic lesion which are intensely hypermetabolic consistent with metastatic NET. There is also a smaller less avid left hepatic lesion that is concerning for metastasis which was not seen on CT from 06/11/19. No other evidence of mets -His MRI abdomen from 08/04/19 shows 3 liver lesions (0.8-4.4cm) compatible with neuroendocrine metastatic lesions.  -He was seen by Dr. Donell Beers and she did not recommend surgery due to the multiple liver mets.  -He was on monthly Lanreotide injections since 08/01/19. He was found to have progression on 05/31/20 DOTATATE PET scan, and received 4 doses of Lutathera 08/17/20 - 03/17/21. -restaging DOTATATE PET on 04/28/21 showed positive response to treatment. We discontinued his lanreotide injections after 05/13/21. -surveillance CT CAP on 04/18/22 showed stable disease -Due to persistent diarrhea, he restarted lanreotide injection in September 2023,  diarrhea has resolved.  Will continue monthly. -Restaging CT scan in April 2024 showed overall stable disease, and improved liver metastasis.  -Repeated CT scan from June 24, 2023 was done without IV contrast, his liver metastasis felt to be improved, however he has developed multiple pelvic adenopathy.  I will obtain a dotatate PET scan for further evaluation.  Given his recently elevated PSA, metastatic prostate cancer to pelvic lymph nodes is also a possibility. -the patient has seen urology since his most recent visit. He did have prostate biopsy done 07/09/2023 with multiple samples taken. He did have chronic inflammatory changes and atrophy without evidence of malignancy.

## 2023-08-01 NOTE — Progress Notes (Deleted)
 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 Pathology confirmed well differentiated neuroendocrine tumor, with metastasis to 14 of 32 LNs. Margins clear.  -07/16/19 PET shows two right hepatic lesion which are intensely hypermetabolic consistent with metastatic NET. There is also a smaller less avid left hepatic lesion that is concerning for metastasis which was not seen on CT from 06/11/19. No other evidence of mets -His MRI abdomen from 08/04/19 shows 3 liver lesions (0.8-4.4cm) compatible with neuroendocrine metastatic lesions.  -He was seen by Dr. Aron and she did not recommend surgery due to the multiple liver mets.  -He was on monthly Lanreotide injections since 08/01/19. He was found to have progression on 05/31/20 DOTATATE PET scan, and received 4 doses of Lutathera  08/17/20 - 03/17/21. -restaging DOTATATE PET on 04/28/21 showed positive response to treatment. We discontinued his lanreotide injections after 05/13/21. -surveillance CT CAP on 04/18/22 showed stable disease -Due to persistent diarrhea, he restarted lanreotide injection in September 2023,  diarrhea has resolved.  Will continue monthly. -Restaging CT scan in April 2024 showed overall stable disease, and improved liver metastasis.   -Repeated CT scan from June 24, 2023 was done without IV contrast, his liver metastasis felt to be improved, however he has developed multiple pelvic adenopathy.  I will obtain a dotatate PET scan for further evaluation.  Given his recently elevated PSA, metastatic prostate cancer to pelvic lymph nodes is also a possibility. -the patient has seen urology since his most recent visit. He did have prostate biopsy done 07/09/2023 with multiple samples taken. He did have chronic inflammatory changes and atrophy without evidence of malignancy.     The patient understands the plans discussed today and is in agreement with them.  He knows to contact our office if he develops concerns prior to his next appointment.  I provided *** minutes of face-to-face time during this encounter and > 50% was spent counseling as documented under my assessment and plan.    Powell FORBES Lessen, NP  [facility] - A DEPT OF [facility] [address] [place] [address] Dept: [phone] Dept Fax: [phone]   No orders of the defined types were placed in this encounter.     CHIEF COMPLAINT:  CC: carcinoid tumor of jejunum   Current Treatment:  lanreotide injection monthly   INTERVAL HISTORY:  Dekendrick is here today for repeat clinical assessment.  The patient was last seen by Dr. Lanny on 07/04/2023.  Due to elevated PSA, the patient's urologist moved forward with prostate biopsy. This was done on 07/09/2023. Multiple samples were taken. They showed chronic inflammatory changes and atrophy without evidence of malignancy. He denies fevers or chills. He denies pain. His appetite is good. His weight {Weight change:10426}.  I have reviewed the past medical history, past surgical history, social history and family  history with the patient and they are unchanged from previous note.  ALLERGIES:  has no known allergies.  MEDICATIONS:  Current  Outpatient Medications  Medication Sig Dispense Refill   amLODipine  (NORVASC ) 10 MG tablet Take 1 tablet (10 mg total) by mouth daily. 90 tablet 0   aspirin  EC 81 MG tablet Take 1 tablet (81 mg total) by mouth 2 (two) times daily. To be taken after surgery 84 tablet 0   atorvastatin  (LIPITOR) 40 MG tablet Take 1 tablet (40 mg total) by mouth daily. 90 tablet 0   carvedilol  (COREG ) 25 MG tablet Take 1 tablet (25 mg total) by mouth 2 (two) times daily with a meal. 180 tablet 0   chlorthalidone  (HYGROTON ) 25 MG tablet Take 1 tablet (25 mg total) by mouth daily. 90 tablet 0   diclofenac  (VOLTAREN ) 75 MG EC tablet Take 1 tablet (75 mg total) by mouth 2 (two) times daily as needed. 60 tablet 0   LANREOTIDE ACETATE  [facility] Inject 120 mg into the skin daily.     metFORMIN  (GLUCOPHAGE ) 500 MG tablet Take 1 tablet (500 mg total) by mouth daily with breakfast. 90 tablet 0   tadalafil  (CIALIS ) 20 MG tablet Take 0.5-1 tablets (10-20 mg total) by mouth daily as needed for erectile dysfunction. 10 tablet 11   tamsulosin  (FLOMAX ) 0.4 MG CAPS capsule Take 1 capsule (0.4 mg total) by mouth daily. 90 capsule 3   valsartan  (DIOVAN ) 320 MG tablet Take 1 tablet (320 mg total) by mouth daily. 90 tablet 0   No current facility-administered medications for this visit.    HISTORY OF PRESENT ILLNESS:   Oncology History Overview Note  Cancer Staging No matching staging information was found for the patient.    Carcinoid tumor of small intestine  06/11/2019 Imaging   US  Abdomen 06/11/19  IMPRESSION: 1. There is a solid mass in the right lobe of the liver which based on current measurements may have enlarged since the prior study from 2015. Images from the previous MR at that time cannot be retrieved. Given this circumstance, it may be prudent to correlate with pre and serial post-contrast MR or CT of the liver to further evaluate. There is underlying diffuse increase in liver echogenicity, a finding indicative of  hepatic steatosis.   2. Multiple loops of fluid-filled bowel. Question a degree of ileus or enteritis.   3. Study otherwise unremarkable. Note that much of the common bile duct is obscured by gas.    06/11/2019 Imaging   CT AP W Contrast 06/11/19   IMPRESSION: 1. High-grade small bowel obstruction secondary to desmoplastic response in the central right abdominal mesentery centered on a 4 cm calcified irregular/spiculated soft tissue lesion. Abrupt small bowel transition zone is identified immediately lead adjacent to this mesenteric lesion and multiple adjacent bowel loops are tethered into this region with mesenteric edema/congestion. The loop of small bowel immediately proximal to the transition zone shows mild circumferential wall thickening but no pneumatosis. 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 2. Small lymph nodes in the abnormal right mesentery suggest additional metastatic involvement. 3. Heterogeneous liver parenchyma, likely secondary to geographic fatty deposition. There is a focal 14 mm low-density lesion in the right liver. The patient had an MRI in the [facility] system on 08/09/2013 to evaluate a right liver lesion, but those images are not available. Follow-up MRI without and with contrast recommended to exclude metastatic disease. 4. Tiny sclerotic foci in the  T12 vertebral body in both femoral heads are likely benign, but close attention on follow-up recommended. 5.  Aortic Atherosclerois (ICD10-170.0)      06/12/2019 Surgery    EXPLORATORY LAPAROTOMY WITH SMALL BOWEL RESECTION by Dr. Gladis  06/12/19    06/12/2019 Initial Biopsy   FINAL MICROSCOPIC DIAGNOSIS: 06/12/19 -  Well-differentiated neuroendocrine tumor, 5.0 cm  -  Tumor invades adjacent loops of small bowel  -  Perineural invasion and extensive lymphovascular space invasion  -  Metastatic neuroendocrine  tumor involving fourteen of thirty-two  lymph nodes (14/32)  -  Margins uninvolved by neoplasm  -  See oncology table and comment below    06/13/2019 Initial Diagnosis   Carcinoid tumor of intestine producing obstruction--resected Nov 2020   07/16/2019 PET scan   IMPRESSION: 1. Interval resection small bowel and mesenteric mass with no evidence residual mesenteric or small bowel neuroendocrine tumor. 2. Large lesion in RIGHT hepatic lobe with intense radiotracer activity consistent with well differentiated neuroendocrine tumor metastasis. Smaller lesion in the anterior LEFT hepatic lobe is concerning for second hepatic metastasis. 3. Pulmonary nodule measuring 6 mm in LEFT upper lobe is not have radiotracer activity. Smaller RIGHT upper lobe nodule. Recommend close attention on follow-up.   08/01/2019 - 05/07/2020 Chemotherapy   Lanreotide injection monthly starting 08/01/19. Stopped after 05/07/20 due to disease progression     08/04/2019 Imaging   MRI abdomen  IMPRESSION: 1. The previously noted lesion in the right lobe of the liver has grown slightly compared to 2015. In addition, today's study demonstrates 2 smaller lesions with similar imaging characteristics. Given the activity on the recent PET Dotatate scan, these lesions are all compatible with small neuroendocrine metastatic lesions. 2. Hepatic steatosis.   02/10/2020 Imaging   MRI abdomen  IMPRESSION: 1. The dominant right hepatic lobe mass has mildly increased in size compared to the prior exam. This currently measures 5.4 by 4.5 cm, previously 4.9 by 4.2 cm. 2. Nine additional small T2 hyperintense foci in the liver are stable, and while the small size makes these less specific, there likely small metastatic lesions. Today's exam has the benefit of less motion artifact compared to the 06/03/2020 MRI, and I was able to pick at each of these tiny lesions on the prior exam in retrospect. It is conceivable that 1 or more  of these tiny lesions could represent small hemangiomas, although I do not observe classic delayed enhancement pattern. 3. Diffuse hepatic steatosis.   05/31/2020 PET scan   DOTATATE PET  IMPRESSION: 1. Progression of well differentiated neuroendocrine tumor hepatic metastasis with increase in size and radiotracer activity of dominant lesion in the LEFT hepatic lobe and multiple small lesions as described above. 2. New small focus of radiotracer activity within the head of the pancreas. 3. Increased hepatic steatosis.      Chemotherapy   Lutathera  treatments on 08/11/20, 10/06/20, 12/01/20, 01/26/21      04/28/2021 PET scan   NETSPOT  GA 68 DOTATATE  IMPRESSION: 1. Interval decrease in radiotracer activity of neuroendocrine tumor hepatic metastasis. Dominant lesion the RIGHT hepatic lobe is mildly in size. Several small peripheral lesions now have no radiotracer activity. Lesions are conspicuous in liver on noncontrast exam due to hepatic steatosis. 2. Small lesion in the head of the pancreas ahs decreased radiotracer activity. Lesion difficult to define on CT portion exam. 3. No evidence new metastatic neuroendocrine tumor. 4. Post RIGHT hemicolectomy anatomy without complication 5. New bilateral symmetric gynecomastia.       REVIEW  OF SYSTEMS:   Constitutional: Denies fevers, chills or abnormal weight loss Eyes: Denies blurriness of vision Ears, nose, mouth, throat, and face: Denies mucositis or sore throat Respiratory: Denies cough, dyspnea or wheezes Cardiovascular: Denies palpitation, chest discomfort or lower extremity swelling Gastrointestinal:  Denies nausea, heartburn or change in bowel habits Skin: Denies abnormal skin rashes Lymphatics: Denies new lymphadenopathy or easy bruising Neurological:Denies numbness, tingling or new weaknesses Behavioral/Psych: Mood is stable, no new changes  All other systems were reviewed with the patient and are negative.   VITALS:   There were no vitals taken for this visit.  Wt Readings from Last 3 Encounters:  07/04/23 249 lb 8 oz (113.2 kg)  07/03/23 239 lb (108.4 kg)  06/18/23 244 lb 3.2 oz (110.8 kg)    There is no height or weight on file to calculate BMI.  Performance status (ECOG): {CHL ONC [id number]  PHYSICAL EXAM:   GENERAL:alert, no distress and comfortable SKIN: skin color, texture, turgor are normal, no rashes or significant lesions EYES: normal, Conjunctiva are pink and non-injected, sclera clear OROPHARYNX:no exudate, no erythema and lips, buccal mucosa, and tongue normal  NECK: supple, thyroid normal size, non-tender, without nodularity LYMPH:  no palpable lymphadenopathy in the cervical, axillary or inguinal LUNGS: clear to auscultation and percussion with normal breathing effort HEART: regular rate & rhythm and no murmurs and no lower extremity edema ABDOMEN:abdomen soft, non-tender and normal bowel sounds Musculoskeletal:no cyanosis of digits and no clubbing  NEURO: alert & oriented x 3 with fluent speech, no focal motor/sensory deficits  LABORATORY DATA:  I have reviewed the data as listed    Component Value Date/Time   NA 140 07/04/2023 1011   NA 138 11/14/2019 1428   K 4.3 07/04/2023 1011   CL 102 07/04/2023 1011   CO2 32 07/04/2023 1011   GLUCOSE 172 (H) 07/04/2023 1011   BUN 23 (H) 07/04/2023 1011   BUN 17 11/14/2019 1428   CREATININE 1.59 (H) 07/04/2023 1011   CALCIUM  9.5 07/04/2023 1011   PROT 7.5 07/04/2023 1011   ALBUMIN 4.3 07/04/2023 1011   AST 12 (L) 07/04/2023 1011   ALT 15 07/04/2023 1011   ALKPHOS 79 07/04/2023 1011   BILITOT 0.6 07/04/2023 1011   GFRNONAA 51 (L) 07/04/2023 1011   GFRAA 57 (L) 02/06/2020 1050    No results found for: SPEP, UPEP  Lab Results  Component Value Date   WBC 7.4 07/04/2023   NEUTROABS 5.4 07/04/2023   HGB 12.6 (L) 07/04/2023   HCT 38.5 (L) 07/04/2023   MCV 92.5 07/04/2023   PLT 380 07/04/2023      Chemistry       Component Value Date/Time   NA 140 07/04/2023 1011   NA 138 11/14/2019 1428   K 4.3 07/04/2023 1011   CL 102 07/04/2023 1011   CO2 32 07/04/2023 1011   BUN 23 (H) 07/04/2023 1011   BUN 17 11/14/2019 1428   CREATININE 1.59 (H) 07/04/2023 1011      Component Value Date/Time   CALCIUM  9.5 07/04/2023 1011   ALKPHOS 79 07/04/2023 1011   AST 12 (L) 07/04/2023 1011   ALT 15 07/04/2023 1011   BILITOT 0.6 07/04/2023 1011       RADIOGRAPHIC STUDIES: I have personally reviewed the radiological images as listed and agreed with the findings in the report. US  Transrectal Complete Result Date: 07/10/2023 Please see Notes tab for imaging impression.  US  Intraoperative Result Date: 07/10/2023 CLINICAL DATA:  Ultrasound was provided for  use by the ordering physician.  No provider Interpretation or professional fees incurred.    US  Guided Needle Placement Result Date: 07/10/2023 CLINICAL DATA:  Ultrasound was provided for use by the ordering physician.  No provider Interpretation or professional fees incurred.    US  PROSTATE BIOPSY MULTIPLE Result Date: 07/10/2023 Please see Notes tab for imaging impression.

## 2023-08-02 ENCOUNTER — Inpatient Hospital Stay: Payer: No Typology Code available for payment source

## 2023-08-02 ENCOUNTER — Telehealth: Payer: Self-pay | Admitting: Nurse Practitioner

## 2023-08-02 ENCOUNTER — Inpatient Hospital Stay: Payer: No Typology Code available for payment source | Admitting: Nurse Practitioner

## 2023-08-02 DIAGNOSIS — D3A011 Benign carcinoid tumor of the jejunum: Secondary | ICD-10-CM

## 2023-08-02 NOTE — Telephone Encounter (Signed)
Patient is aware of rescheduled appointment times/dates 

## 2023-08-06 ENCOUNTER — Encounter: Payer: Self-pay | Admitting: Oncology

## 2023-08-12 NOTE — Progress Notes (Deleted)
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 Pathology confirmed well differentiated neuroendocrine tumor, with metastasis to 14 of 32 LNs. Margins clear.  -07/16/19 PET shows two right hepatic lesion which are intensely hypermetabolic consistent with metastatic NET. There is also a smaller less avid left hepatic lesion that is concerning for metastasis which was not seen on CT from 06/11/19. No other evidence of mets -His MRI abdomen from 08/04/19 shows 3 liver lesions (0.8-4.4cm) compatible with neuroendocrine metastatic lesions.  -He was seen by Dr. Donell Beers and she did not recommend surgery due to the multiple liver mets.  -He was on monthly Lanreotide injections since 08/01/19. He was found to have progression on 05/31/20 DOTATATE PET scan, and received 4 doses of Lutathera 08/17/20 - 03/17/21. -restaging DOTATATE PET on 04/28/21 showed positive response to treatment. We discontinued his lanreotide injections after 05/13/21. -surveillance CT CAP on 04/18/22 showed stable disease -Due to persistent diarrhea, he restarted lanreotide injection in September 2023,  diarrhea has resolved.  Will continue monthly. -Restaging CT scan in April 2024 showed overall stable disease, and improved liver metastasis.   -Repeated CT scan from June 24, 2023 was done without IV contrast, his liver metastasis felt to be improved, however he has developed multiple pelvic adenopathy.  I will obtain a dotatate PET scan for further evaluation.  Given his recently elevated PSA, metastatic prostate cancer to pelvic lymph nodes is also a possibility. -the patient has seen urology since his most recent visit. He did have prostate biopsy done 07/09/2023 with multiple samples taken. He did have chronic inflammatory changes and atrophy without evidence of malignancy.      The patient understands the plans discussed today and is in agreement with them.  He knows to contact our office if he develops concerns prior to his next appointment.  I provided *** minutes of face-to-face time during this encounter and > 50% was spent counseling as documented under my assessment and plan.    Carlean Jews, NP  [facility] - A DEPT [facility] [address] [place] [address] Dept: [phone] Dept Fax: [phone]   No orders of the defined types were placed in this encounter.     CHIEF COMPLAINT:  CC: carcinoid tumor of jejunum   Current Treatment:  lanreotide injection monthly   INTERVAL HISTORY:  Javontay is here today for repeat clinical assessment.  The patient was last seen by Dr. Mosetta Putt on 07/04/2023.  Due to elevated PSA, the patient's urologist moved forward with prostate biopsy. This was done on 07/09/2023. Multiple samples were taken. They showed chronic inflammatory changes and atrophy without evidence of malignancy. He denies fevers or chills. He denies pain. His appetite is good. His weight {Weight change:10426}.  I have reviewed the past medical history, past surgical history, social history and  family history with the patient and they are unchanged from previous note.  ALLERGIES:  has no known allergies.  MEDICATIONS:  Current  Outpatient Medications  Medication Sig Dispense Refill   amLODipine (NORVASC) 10 MG tablet Take 1 tablet (10 mg total) by mouth daily. 90 tablet 0   aspirin EC 81 MG tablet Take 1 tablet (81 mg total) by mouth 2 (two) times daily. To be taken after surgery 84 tablet 0   atorvastatin (LIPITOR) 40 MG tablet Take 1 tablet (40 mg total) by mouth daily. 90 tablet 0   carvedilol (COREG) 25 MG tablet Take 1 tablet (25 mg total) by mouth 2 (two) times daily with a meal. 180 tablet 0   chlorthalidone (HYGROTON) 25 MG tablet Take 1 tablet (25 mg total) by mouth daily. 90 tablet 0   diclofenac (VOLTAREN) 75 MG EC tablet Take 1 tablet (75 mg total) by mouth 2 (two) times daily as needed. 60 tablet 0   LANREOTIDE ACETATE [place] Inject 120 mg into the skin daily.     metFORMIN (GLUCOPHAGE) 500 MG tablet Take 1 tablet (500 mg total) by mouth daily with breakfast. 90 tablet 0   tadalafil (CIALIS) 20 MG tablet Take 0.5-1 tablets (10-20 mg total) by mouth daily as needed for erectile dysfunction. 10 tablet 11   tamsulosin (FLOMAX) 0.4 MG CAPS capsule Take 1 capsule (0.4 mg total) by mouth daily. 90 capsule 3   valsartan (DIOVAN) 320 MG tablet Take 1 tablet (320 mg total) by mouth daily. 90 tablet 0   No current facility-administered medications for this visit.    HISTORY OF PRESENT ILLNESS:   Oncology History Overview Note  Cancer Staging No matching staging information was found for the patient.    Carcinoid tumor of small intestine  06/11/2019 Imaging   US Abdomen 06/11/19  IMPRESSION: 1. There is a solid mass in the right lobe of the liver which based on current measurements may have enlarged since the prior study from 2015. Images from the previous MR at that time cannot be retrieved. Given this circumstance, it may be prudent to correlate with pre and serial post-contrast MR or CT of the liver to further evaluate. There is underlying diffuse increase in liver echogenicity, a finding indicative of  hepatic steatosis.   2. Multiple loops of fluid-filled bowel. Question a degree of ileus or enteritis.   3. Study otherwise unremarkable. Note that much of the common bile duct is obscured by gas.    06/11/2019 Imaging   CT AP W Contrast 06/11/19   IMPRESSION: 1. High-grade small bowel obstruction secondary to desmoplastic response in the central right abdominal mesentery centered on a 4 cm calcified irregular/spiculated soft tissue lesion. Abrupt small bowel transition zone is identified immediately lead adjacent to this mesenteric lesion and multiple adjacent bowel loops are tethered into this region with mesenteric edema/congestion. The loop of small bowel immediately proximal to the transition zone shows mild circumferential wall thickening but no pneumatosis. 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 2. Small lymph nodes in the abnormal right mesentery suggest additional metastatic involvement. 3. Heterogeneous liver parenchyma, likely secondary to geographic fatty deposition. There is a focal 14 mm low-density lesion in the right liver. The patient had an MRI in the [facility] on 08/09/2013 to evaluate a right liver lesion, but those images are not available. Follow-up MRI without and with contrast recommended to exclude metastatic disease. 4. Tiny sclerotic foci in  the T12 vertebral body in both femoral heads are likely benign, but close attention on follow-up recommended. 5.  Aortic Atherosclerois (ICD10-170.0)      06/12/2019 Surgery    EXPLORATORY LAPAROTOMY WITH SMALL BOWEL RESECTION by Dr. Daphine Deutscher  06/12/19    06/12/2019 Initial Biopsy   FINAL MICROSCOPIC DIAGNOSIS: 06/12/19 -  Well-differentiated neuroendocrine tumor, 5.0 cm  -  Tumor invades adjacent loops of small bowel  -  Perineural invasion and extensive lymphovascular space invasion  -  Metastatic neuroendocrine  tumor involving fourteen of thirty-two  lymph nodes (14/32)  -  Margins uninvolved by neoplasm  -  See oncology table and comment below    06/13/2019 Initial Diagnosis   Carcinoid tumor of intestine producing obstruction--resected Nov 2020   07/16/2019 PET scan   IMPRESSION: 1. Interval resection small bowel and mesenteric mass with no evidence residual mesenteric or small bowel neuroendocrine tumor. 2. Large lesion in RIGHT hepatic lobe with intense radiotracer activity consistent with well differentiated neuroendocrine tumor metastasis. Smaller lesion in the anterior LEFT hepatic lobe is concerning for second hepatic metastasis. 3. Pulmonary nodule measuring 6 mm in LEFT upper lobe is not have radiotracer activity. Smaller RIGHT upper lobe nodule. Recommend close attention on follow-up.   08/01/2019 - 05/07/2020 Chemotherapy   Lanreotide injection monthly starting 08/01/19. Stopped after 05/07/20 due to disease progression     08/04/2019 Imaging   MRI abdomen  IMPRESSION: 1. The previously noted lesion in the right lobe of the liver has grown slightly compared to 2015. In addition, today's study demonstrates 2 smaller lesions with similar imaging characteristics. Given the activity on the recent PET Dotatate scan, these lesions are all compatible with small neuroendocrine metastatic lesions. 2. Hepatic steatosis.   02/10/2020 Imaging   MRI abdomen  IMPRESSION: 1. The dominant right hepatic lobe mass has mildly increased in size compared to the prior exam. This currently measures 5.4 by 4.5 cm, previously 4.9 by 4.2 cm. 2. Nine additional small T2 hyperintense foci in the liver are stable, and while the small size makes these less specific, there likely small metastatic lesions. Today's exam has the benefit of less motion artifact compared to the 06/03/2020 MRI, and I was able to pick at each of these tiny lesions on the prior exam in retrospect. It is conceivable that 1 or more  of these tiny lesions could represent small hemangiomas, although I do not observe classic delayed enhancement pattern. 3. Diffuse hepatic steatosis.   05/31/2020 PET scan   DOTATATE PET  IMPRESSION: 1. Progression of well differentiated neuroendocrine tumor hepatic metastasis with increase in size and radiotracer activity of dominant lesion in the LEFT hepatic lobe and multiple small lesions as described above. 2. New small focus of radiotracer activity within the head of the pancreas. 3. Increased hepatic steatosis.      Chemotherapy   Lutathera treatments on 08/11/20, 10/06/20, 12/01/20, 01/26/21      04/28/2021 PET scan   NETSPOT GA 68 DOTATATE  IMPRESSION: 1. Interval decrease in radiotracer activity of neuroendocrine tumor hepatic metastasis. Dominant lesion the RIGHT hepatic lobe is mildly in size. Several small peripheral lesions now have no radiotracer activity. Lesions are conspicuous in liver on noncontrast exam due to hepatic steatosis. 2. Small lesion in the head of the pancreas ahs decreased radiotracer activity. Lesion difficult to define on CT portion exam. 3. No evidence new metastatic neuroendocrine tumor. 4. Post RIGHT hemicolectomy anatomy without complication 5. New bilateral symmetric gynecomastia.  REVIEW OF SYSTEMS:   Constitutional: Denies fevers, chills or abnormal weight loss Eyes: Denies blurriness of vision Ears, nose, mouth, throat, and face: Denies mucositis or sore throat Respiratory: Denies cough, dyspnea or wheezes Cardiovascular: Denies palpitation, chest discomfort or lower extremity swelling Gastrointestinal:  Denies nausea, heartburn or change in bowel habits Skin: Denies abnormal skin rashes Lymphatics: Denies new lymphadenopathy or easy bruising Neurological:Denies numbness, tingling or new weaknesses Behavioral/Psych: Mood is stable, no new changes  All other systems were reviewed with the patient and are negative.   VITALS:   There were no vitals taken for this visit.  Wt Readings from Last 3 Encounters:  07/04/23 249 lb 8 oz (113.2 kg)  07/03/23 239 lb (108.4 kg)  06/18/23 244 lb 3.2 oz (110.8 kg)    There is no height or weight on file to calculate BMI.  Performance status (ECOG): {CHL ONC [id number]  PHYSICAL EXAM:   GENERAL:alert, no distress and comfortable SKIN: skin color, texture, turgor are normal, no rashes or significant lesions EYES: normal, Conjunctiva are pink and non-injected, sclera clear OROPHARYNX:no exudate, no erythema and lips, buccal mucosa, and tongue normal  NECK: supple, thyroid normal size, non-tender, without nodularity LYMPH:  no palpable lymphadenopathy in the cervical, axillary or inguinal LUNGS: clear to auscultation and percussion with normal breathing effort HEART: regular rate & rhythm and no murmurs and no lower extremity edema ABDOMEN:abdomen soft, non-tender and normal bowel sounds Musculoskeletal:no cyanosis of digits and no clubbing  NEURO: alert & oriented x 3 with fluent speech, no focal motor/sensory deficits  LABORATORY DATA:  I have reviewed the data as listed    Component Value Date/Time   NA 140 07/04/2023 1011   NA 138 11/14/2019 1428   K 4.3 07/04/2023 1011   CL 102 07/04/2023 1011   CO2 32 07/04/2023 1011   GLUCOSE 172 (H) 07/04/2023 1011   BUN 23 (H) 07/04/2023 1011   BUN 17 11/14/2019 1428   CREATININE 1.59 (H) 07/04/2023 1011   CALCIUM 9.5 07/04/2023 1011   PROT 7.5 07/04/2023 1011   ALBUMIN 4.3 07/04/2023 1011   AST 12 (L) 07/04/2023 1011   ALT 15 07/04/2023 1011   ALKPHOS 79 07/04/2023 1011   BILITOT 0.6 07/04/2023 1011   GFRNONAA 51 (L) 07/04/2023 1011   GFRAA 57 (L) 02/06/2020 1050     Lab Results  Component Value Date   WBC 7.4 07/04/2023   NEUTROABS 5.4 07/04/2023   HGB 12.6 (L) 07/04/2023   HCT 38.5 (L) 07/04/2023   MCV 92.5 07/04/2023   PLT 380 07/04/2023

## 2023-08-12 NOTE — Assessment & Plan Note (Deleted)
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 Pathology confirmed well differentiated neuroendocrine tumor, with metastasis to 14 of 32 LNs. Margins clear.  -07/16/19 PET shows two right hepatic lesion which are intensely hypermetabolic consistent with metastatic NET. There is also a smaller less avid left hepatic lesion that is concerning for metastasis which was not seen on CT from 06/11/19. No other evidence of mets -His MRI abdomen from 08/04/19 shows 3 liver lesions (0.8-4.4cm) compatible with neuroendocrine metastatic lesions.  -He was seen by Dr. Donell Beers and she did not recommend surgery due to the multiple liver mets.  -He was on monthly Lanreotide injections since 08/01/19. He was found to have progression on 05/31/20 DOTATATE PET scan, and received 4 doses of Lutathera 08/17/20 - 03/17/21. -restaging DOTATATE PET on 04/28/21 showed positive response to treatment. We discontinued his lanreotide injections after 05/13/21. -surveillance CT CAP on 04/18/22 showed stable disease -Due to persistent diarrhea, he restarted lanreotide injection in September 2023,  diarrhea has resolved.  Will continue monthly. -Restaging CT scan in April 2024 showed overall stable disease, and improved liver metastasis.  -Repeated CT scan from June 24, 2023 was done without IV contrast, his liver metastasis felt to be improved, however he has developed multiple pelvic adenopathy.  I will obtain a dotatate PET scan for further evaluation.  Given his recently elevated PSA, metastatic prostate cancer to pelvic lymph nodes is also a possibility. -the patient has seen urology since his most recent visit. He did have prostate biopsy done 07/09/2023 with multiple samples taken. He did have chronic inflammatory changes and atrophy without evidence of malignancy.

## 2023-08-13 ENCOUNTER — Inpatient Hospital Stay: Payer: No Typology Code available for payment source | Admitting: Nurse Practitioner

## 2023-08-13 ENCOUNTER — Inpatient Hospital Stay: Payer: No Typology Code available for payment source

## 2023-08-13 ENCOUNTER — Telehealth: Payer: Self-pay | Admitting: Nurse Practitioner

## 2023-08-13 DIAGNOSIS — D3A011 Benign carcinoid tumor of the jejunum: Secondary | ICD-10-CM

## 2023-08-13 NOTE — Telephone Encounter (Signed)
Due to patient being currently sick patient is unable to make it to scheduled appointment; patient is aware of rescheduled appointment times/dates

## 2023-08-26 NOTE — Progress Notes (Deleted)
 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 Pathology confirmed well differentiated neuroendocrine tumor, with metastasis to 14 of 32 LNs. Margins clear.  -07/16/19 PET shows two right hepatic lesion which are intensely hypermetabolic consistent with metastatic NET. There is also a smaller less avid left hepatic lesion that is concerning for metastasis which was not seen on CT from 06/11/19. No other evidence of mets -His MRI abdomen from 08/04/19 shows 3 liver lesions (0.8-4.4cm) compatible with neuroendocrine metastatic lesions.  -He was seen by Dr. Donell Beers and she did not recommend surgery due to the multiple liver mets.  -He was on monthly Lanreotide injections since 08/01/19. He was found to have progression on 05/31/20 DOTATATE PET scan, and received 4 doses of Lutathera 08/17/20 - 03/17/21. -restaging DOTATATE PET on 04/28/21 showed positive response to treatment. We discontinued his lanreotide injections after 05/13/21. -surveillance CT CAP on 04/18/22 showed stable disease -Due to persistent diarrhea, he restarted lanreotide injection in September 2023,  diarrhea has resolved.  Will continue monthly. -Restaging CT scan in April 2024 showed overall stable disease, and improved liver metastasis.   -Repeated CT scan from June 24, 2023 was done without IV contrast, his liver metastasis felt to be improved, however he has developed multiple pelvic adenopathy.  I will obtain a dotatate PET scan for further evaluation.  Given his recently elevated PSA, metastatic prostate cancer to pelvic lymph nodes is also a possibility. -the patient has seen urology since his most recent visit. He did have prostate biopsy done 07/09/2023 with multiple samples taken. He did have chronic inflammatory changes and atrophy without evidence of malignancy.       The patient understands the plans discussed today and is in agreement with them.  He knows to contact our office if he develops concerns prior to his next appointment.  I provided *** minutes of face-to-face time during this encounter and > 50% was spent counseling as documented under my assessment and plan.    Carlean Jews, NP  [facility] - A DEPT OF [facility] [address] [place] [address] Dept: [phone] Dept Fax: [phone]   No orders of the defined types were placed in this encounter.     CHIEF COMPLAINT:  CC: carcinoid tumor of jejunum   Current Treatment:  lanreotide injection monthly   INTERVAL HISTORY:  Dwayne Sawyer is here today for repeat clinical assessment.  The patient was last seen by Dr. Mosetta Putt on 07/04/2023.  Due to elevated PSA, the patient's urologist moved forward with prostate biopsy. This was done on 07/09/2023. Multiple samples were taken. They showed chronic inflammatory changes and atrophy without evidence of malignancy. He denies fevers or chills. He denies pain. His appetite is good. His weight {Weight change:10426}.  I have reviewed the past medical history, past surgical history, social history  and family history with the patient and they are unchanged from previous note.  ALLERGIES:  has no known allergies.  MEDICATIONS:  Current  Outpatient Medications  Medication Sig Dispense Refill   amLODipine (NORVASC) 10 MG tablet Take 1 tablet (10 mg total) by mouth daily. 90 tablet 0   aspirin EC 81 MG tablet Take 1 tablet (81 mg total) by mouth 2 (two) times daily. To be taken after surgery 84 tablet 0   atorvastatin (LIPITOR) 40 MG tablet Take 1 tablet (40 mg total) by mouth daily. 90 tablet 0   carvedilol (COREG) 25 MG tablet Take 1 tablet (25 mg total) by mouth 2 (two) times daily with a meal. 180 tablet 0   chlorthalidone (HYGROTON) 25 MG tablet Take 1 tablet (25 mg total) by mouth daily. 90 tablet 0   diclofenac (VOLTAREN) 75 MG EC tablet Take 1 tablet (75 mg total) by mouth 2 (two) times daily as needed. 60 tablet 0   LANREOTIDE ACETATE [place] Inject 120 mg into the skin daily.     metFORMIN (GLUCOPHAGE) 500 MG tablet Take 1 tablet (500 mg total) by mouth daily with breakfast. 90 tablet 0   tadalafil (CIALIS) 20 MG tablet Take 0.5-1 tablets (10-20 mg total) by mouth daily as needed for erectile dysfunction. 10 tablet 11   tamsulosin (FLOMAX) 0.4 MG CAPS capsule Take 1 capsule (0.4 mg total) by mouth daily. 90 capsule 3   valsartan (DIOVAN) 320 MG tablet Take 1 tablet (320 mg total) by mouth daily. 90 tablet 0   No current facility-administered medications for this visit.    HISTORY OF PRESENT ILLNESS:   Oncology History Overview Note  Cancer Staging No matching staging information was found for the patient.    Carcinoid tumor of small intestine  06/11/2019 Imaging   US Abdomen 06/11/19  IMPRESSION: 1. There is a solid mass in the right lobe of the liver which based on current measurements may have enlarged since the prior study from 2015. Images from the previous MR at that time cannot be retrieved. Given this circumstance, it may be prudent to correlate with pre and serial post-contrast MR or CT of the liver to further evaluate. There is underlying diffuse increase in liver echogenicity, a finding indicative of  hepatic steatosis.   2. Multiple loops of fluid-filled bowel. Question a degree of ileus or enteritis.   3. Study otherwise unremarkable. Note that much of the common bile duct is obscured by gas.    06/11/2019 Imaging   CT AP W Contrast 06/11/19   IMPRESSION: 1. High-grade small bowel obstruction secondary to desmoplastic response in the central right abdominal mesentery centered on a 4 cm calcified irregular/spiculated soft tissue lesion. Abrupt small bowel transition zone is identified immediately lead adjacent to this mesenteric lesion and multiple adjacent bowel loops are tethered into this region with mesenteric edema/congestion. The loop of small bowel immediately proximal to the transition zone shows mild circumferential wall thickening but no pneumatosis. 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 2. Small lymph nodes in the abnormal right mesentery suggest additional metastatic involvement. 3. Heterogeneous liver parenchyma, likely secondary to geographic fatty deposition. There is a focal 14 mm low-density lesion in the right liver. The patient had an MRI in the [facility] system on 08/09/2013 to evaluate a right liver lesion, but those images are not available. Follow-up MRI without and with contrast recommended to exclude metastatic disease. 4. Tiny sclerotic foci  in the T12 vertebral body in both femoral heads are likely benign, but close attention on follow-up recommended. 5.  Aortic Atherosclerois (ICD10-170.0)      06/12/2019 Surgery    EXPLORATORY LAPAROTOMY WITH SMALL BOWEL RESECTION by Dr. Daphine Deutscher  06/12/19    06/12/2019 Initial Biopsy   FINAL MICROSCOPIC DIAGNOSIS: 06/12/19 -  Well-differentiated neuroendocrine tumor, 5.0 cm  -  Tumor invades adjacent loops of small bowel  -  Perineural invasion and extensive lymphovascular space invasion  -  Metastatic neuroendocrine  tumor involving fourteen of thirty-two  lymph nodes (14/32)  -  Margins uninvolved by neoplasm  -  See oncology table and comment below    06/13/2019 Initial Diagnosis   Carcinoid tumor of intestine producing obstruction--resected Nov 2020   07/16/2019 PET scan   IMPRESSION: 1. Interval resection small bowel and mesenteric mass with no evidence residual mesenteric or small bowel neuroendocrine tumor. 2. Large lesion in RIGHT hepatic lobe with intense radiotracer activity consistent with well differentiated neuroendocrine tumor metastasis. Smaller lesion in the anterior LEFT hepatic lobe is concerning for second hepatic metastasis. 3. Pulmonary nodule measuring 6 mm in LEFT upper lobe is not have radiotracer activity. Smaller RIGHT upper lobe nodule. Recommend close attention on follow-up.   08/01/2019 - 05/07/2020 Chemotherapy   Lanreotide injection monthly starting 08/01/19. Stopped after 05/07/20 due to disease progression     08/04/2019 Imaging   MRI abdomen  IMPRESSION: 1. The previously noted lesion in the right lobe of the liver has grown slightly compared to 2015. In addition, today's study demonstrates 2 smaller lesions with similar imaging characteristics. Given the activity on the recent PET Dotatate scan, these lesions are all compatible with small neuroendocrine metastatic lesions. 2. Hepatic steatosis.   02/10/2020 Imaging   MRI abdomen  IMPRESSION: 1. The dominant right hepatic lobe mass has mildly increased in size compared to the prior exam. This currently measures 5.4 by 4.5 cm, previously 4.9 by 4.2 cm. 2. Nine additional small T2 hyperintense foci in the liver are stable, and while the small size makes these less specific, there likely small metastatic lesions. Today's exam has the benefit of less motion artifact compared to the 06/03/2020 MRI, and I was able to pick at each of these tiny lesions on the prior exam in retrospect. It is conceivable that 1 or more  of these tiny lesions could represent small hemangiomas, although I do not observe classic delayed enhancement pattern. 3. Diffuse hepatic steatosis.   05/31/2020 PET scan   DOTATATE PET  IMPRESSION: 1. Progression of well differentiated neuroendocrine tumor hepatic metastasis with increase in size and radiotracer activity of dominant lesion in the LEFT hepatic lobe and multiple small lesions as described above. 2. New small focus of radiotracer activity within the head of the pancreas. 3. Increased hepatic steatosis.      Chemotherapy   Lutathera treatments on 08/11/20, 10/06/20, 12/01/20, 01/26/21      04/28/2021 PET scan   NETSPOT GA 68 DOTATATE  IMPRESSION: 1. Interval decrease in radiotracer activity of neuroendocrine tumor hepatic metastasis. Dominant lesion the RIGHT hepatic lobe is mildly in size. Several small peripheral lesions now have no radiotracer activity. Lesions are conspicuous in liver on noncontrast exam due to hepatic steatosis. 2. Small lesion in the head of the pancreas ahs decreased radiotracer activity. Lesion difficult to define on CT portion exam. 3. No evidence new metastatic neuroendocrine tumor. 4. Post RIGHT hemicolectomy anatomy without complication 5. New bilateral symmetric gynecomastia.  REVIEW OF SYSTEMS:   Constitutional: Denies fevers, chills or abnormal weight loss Eyes: Denies blurriness of vision Ears, nose, mouth, throat, and face: Denies mucositis or sore throat Respiratory: Denies cough, dyspnea or wheezes Cardiovascular: Denies palpitation, chest discomfort or lower extremity swelling Gastrointestinal:  Denies nausea, heartburn or change in bowel habits Skin: Denies abnormal skin rashes Lymphatics: Denies new lymphadenopathy or easy bruising Neurological:Denies numbness, tingling or new weaknesses Behavioral/Psych: Mood is stable, no new changes  All other systems were reviewed with the patient and are negative.   VITALS:   There were no vitals taken for this visit.  Wt Readings from Last 3 Encounters:  07/04/23 249 lb 8 oz (113.2 kg)  07/03/23 239 lb (108.4 kg)  06/18/23 244 lb 3.2 oz (110.8 kg)    There is no height or weight on file to calculate BMI.  Performance status (ECOG): {CHL ONC [id number]  PHYSICAL EXAM:   GENERAL:alert, no distress and comfortable SKIN: skin color, texture, turgor are normal, no rashes or significant lesions EYES: normal, Conjunctiva are pink and non-injected, sclera clear OROPHARYNX:no exudate, no erythema and lips, buccal mucosa, and tongue normal  NECK: supple, thyroid normal size, non-tender, without nodularity LYMPH:  no palpable lymphadenopathy in the cervical, axillary or inguinal LUNGS: clear to auscultation and percussion with normal breathing effort HEART: regular rate & rhythm and no murmurs and no lower extremity edema ABDOMEN:abdomen soft, non-tender and normal bowel sounds Musculoskeletal:no cyanosis of digits and no clubbing  NEURO: alert & oriented x 3 with fluent speech, no focal motor/sensory deficits  LABORATORY DATA:  I have reviewed the data as listed    Component Value Date/Time   NA 140 07/04/2023 1011   NA 138 11/14/2019 1428   K 4.3 07/04/2023 1011   CL 102 07/04/2023 1011   CO2 32 07/04/2023 1011   GLUCOSE 172 (H) 07/04/2023 1011   BUN 23 (H) 07/04/2023 1011   BUN 17 11/14/2019 1428   CREATININE 1.59 (H) 07/04/2023 1011   CALCIUM 9.5 07/04/2023 1011   PROT 7.5 07/04/2023 1011   ALBUMIN 4.3 07/04/2023 1011   AST 12 (L) 07/04/2023 1011   ALT 15 07/04/2023 1011   ALKPHOS 79 07/04/2023 1011   BILITOT 0.6 07/04/2023 1011   GFRNONAA 51 (L) 07/04/2023 1011   GFRAA 57 (L) 02/06/2020 1050     Lab Results  Component Value Date   WBC 7.4 07/04/2023   NEUTROABS 5.4 07/04/2023   HGB 12.6 (L) 07/04/2023   HCT 38.5 (L) 07/04/2023   MCV 92.5 07/04/2023   PLT 380 07/04/2023

## 2023-08-26 NOTE — Assessment & Plan Note (Deleted)
 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 Pathology confirmed well differentiated neuroendocrine tumor, with metastasis to 14 of 32 LNs. Margins clear.  -07/16/19 PET shows two right hepatic lesion which are intensely hypermetabolic consistent with metastatic NET. There is also a smaller less avid left hepatic lesion that is concerning for metastasis which was not seen on CT from 06/11/19. No other evidence of mets -His MRI abdomen from 08/04/19 shows 3 liver lesions (0.8-4.4cm) compatible with neuroendocrine metastatic lesions.  -He was seen by Dr. Donell Beers and she did not recommend surgery due to the multiple liver mets.  -He was on monthly Lanreotide injections since 08/01/19. He was found to have progression on 05/31/20 DOTATATE PET scan, and received 4 doses of Lutathera 08/17/20 - 03/17/21. -restaging DOTATATE PET on 04/28/21 showed positive response to treatment. We discontinued his lanreotide injections after 05/13/21. -surveillance CT CAP on 04/18/22 showed stable disease -Due to persistent diarrhea, he restarted lanreotide injection in September 2023,  diarrhea has resolved.  Will continue monthly. -Restaging CT scan in April 2024 showed overall stable disease, and improved liver metastasis.  -Repeated CT scan from June 24, 2023 was done without IV contrast, his liver metastasis felt to be improved, however he has developed multiple pelvic adenopathy.  I will obtain a dotatate PET scan for further evaluation.  Given his recently elevated PSA, metastatic prostate cancer to pelvic lymph nodes is also a possibility. -the patient has seen urology since his most recent visit. He did have prostate biopsy done 07/09/2023 with multiple samples taken. He did have chronic inflammatory changes and atrophy without evidence of malignancy.

## 2023-08-27 ENCOUNTER — Telehealth: Payer: Self-pay

## 2023-08-27 ENCOUNTER — Inpatient Hospital Stay: Payer: No Typology Code available for payment source

## 2023-08-27 ENCOUNTER — Inpatient Hospital Stay: Payer: No Typology Code available for payment source | Attending: Hematology

## 2023-08-27 ENCOUNTER — Inpatient Hospital Stay: Payer: No Typology Code available for payment source | Admitting: Nurse Practitioner

## 2023-08-27 DIAGNOSIS — D3A011 Benign carcinoid tumor of the jejunum: Secondary | ICD-10-CM

## 2023-08-27 NOTE — Telephone Encounter (Signed)
Called patient due to not showing up for his 1030 lab appointment. I left a message for the patient to call us back. Will no show patient to all appointments.

## 2023-09-03 ENCOUNTER — Encounter: Payer: Self-pay | Admitting: Oncology

## 2023-09-07 ENCOUNTER — Other Ambulatory Visit: Payer: Self-pay

## 2023-09-07 DIAGNOSIS — R972 Elevated prostate specific antigen [PSA]: Secondary | ICD-10-CM

## 2023-09-11 ENCOUNTER — Ambulatory Visit (HOSPITAL_COMMUNITY): Payer: No Typology Code available for payment source

## 2023-09-11 ENCOUNTER — Ambulatory Visit (HOSPITAL_COMMUNITY)
Admission: RE | Admit: 2023-09-11 | Discharge: 2023-09-11 | Disposition: A | Discharge status: Home / Self Care | Payer: No Typology Code available for payment source | Source: Ambulatory Visit | Attending: Urology | Admitting: Urology

## 2023-09-11 DIAGNOSIS — R972 Elevated prostate specific antigen [PSA]: Secondary | ICD-10-CM | POA: Insufficient documentation

## 2023-09-11 MED ORDER — GADOBUTROL 1 MMOL/ML IV SOLN
10.0000 mL | Freq: Once | INTRAVENOUS | Status: AC | PRN
  Administered 2023-09-11: 10 mL via INTRAVENOUS

## 2023-09-14 ENCOUNTER — Telehealth: Payer: Self-pay

## 2023-09-14 NOTE — Telephone Encounter (Signed)
Spoke with patient, scheduled return appt at his convenience and advised to come the week prior to get PSA drawn.

## 2023-09-14 NOTE — Telephone Encounter (Signed)
-----   Message from Dwayne Sawyer sent at 09/13/2023  2:12 PM EST ----- Regarding: follow up He has had his prostate MRI I need to see him back soon and he needs to get repeat PSA few days prior.  Order in EPIC. Please call him and let him know.  I dont see any appt.  thanks

## 2023-09-17 ENCOUNTER — Other Ambulatory Visit: Payer: Self-pay

## 2023-09-17 ENCOUNTER — Other Ambulatory Visit: Payer: Self-pay | Admitting: Family Medicine

## 2023-09-17 DIAGNOSIS — M1711 Unilateral primary osteoarthritis, right knee: Secondary | ICD-10-CM

## 2023-09-17 DIAGNOSIS — E782 Mixed hyperlipidemia: Secondary | ICD-10-CM

## 2023-09-17 DIAGNOSIS — I10 Essential (primary) hypertension: Secondary | ICD-10-CM

## 2023-09-17 DIAGNOSIS — R7303 Prediabetes: Secondary | ICD-10-CM

## 2023-09-17 DIAGNOSIS — I1A0 Resistant hypertension: Secondary | ICD-10-CM

## 2023-09-17 MED ORDER — AMLODIPINE BESYLATE 10 MG PO TABS: 10.0000 mg | ORAL_TABLET | Freq: Every day | ORAL | 0 refills | Status: DC

## 2023-09-17 MED ORDER — VALSARTAN 320 MG PO TABS: 320.0000 mg | ORAL_TABLET | Freq: Every day | ORAL | 0 refills | Status: DC

## 2023-09-17 MED ORDER — CARVEDILOL 25 MG PO TABS: 25.0000 mg | ORAL_TABLET | Freq: Two times a day (BID) | ORAL | 0 refills | Status: DC

## 2023-09-17 MED ORDER — DICLOFENAC SODIUM 75 MG PO TBEC: 75.0000 mg | DELAYED_RELEASE_TABLET | Freq: Two times a day (BID) | ORAL | 0 refills | Status: DC | PRN

## 2023-09-17 MED ORDER — CHLORTHALIDONE 25 MG PO TABS: 25.0000 mg | ORAL_TABLET | Freq: Every day | ORAL | 0 refills | Status: DC

## 2023-09-17 MED ORDER — METFORMIN HCL 500 MG PO TABS: 500.0000 mg | ORAL_TABLET | Freq: Every day | ORAL | 0 refills | Status: DC

## 2023-09-17 MED ORDER — ATORVASTATIN CALCIUM 40 MG PO TABS: 40.0000 mg | ORAL_TABLET | Freq: Every day | ORAL | 0 refills | Status: DC

## 2023-09-18 ENCOUNTER — Ambulatory Visit: Payer: Self-pay | Admitting: Family Medicine

## 2023-09-23 NOTE — Progress Notes (Deleted)
 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 Pathology confirmed well differentiated neuroendocrine tumor, with metastasis to 14 of 32 LNs. Margins clear.  -07/16/19 PET shows two right hepatic lesion which are intensely hypermetabolic consistent with metastatic NET. There is also a smaller less avid left hepatic lesion that is concerning for metastasis which was not seen on CT from 06/11/19. No other evidence of mets -His MRI abdomen from 08/04/19 shows 3 liver lesions (0.8-4.4cm) compatible with neuroendocrine metastatic lesions.  -He was seen by Dr. Donell Beers and she did not recommend surgery due to the multiple liver mets.  -He was on monthly Lanreotide injections since 08/01/19. He was found to have progression on 05/31/20 DOTATATE PET scan, and received 4 doses of Lutathera 08/17/20 - 03/17/21. -restaging DOTATATE PET on 04/28/21 showed positive response to treatment. We discontinued his lanreotide injections after 05/13/21. -surveillance CT CAP on 04/18/22 showed stable disease -Due to persistent diarrhea, he restarted lanreotide injection in September 2023,  diarrhea has resolved.  Will continue monthly. -Restaging CT scan in April 2024 showed overall stable disease, and improved liver metastasis.   -Repeated CT scan from June 24, 2023 was done without IV contrast, his liver metastasis felt to be improved, however he has developed multiple pelvic adenopathy.  I will obtain a dotatate PET scan for further evaluation.  Given his recently elevated PSA, metastatic prostate cancer to pelvic lymph nodes is also a possibility. -the patient has seen urology since his most recent visit. He did have prostate biopsy done 07/09/2023 with multiple samples taken. He did have chronic inflammatory changes and atrophy without evidence of malignancy.        The patient understands the plans discussed today and is in agreement with them.  He knows to contact our office if he develops concerns prior to his next appointment.  I provided *** minutes of face-to-face time during this encounter and > 50% was spent counseling as documented under my assessment and plan.    Carlean Jews, NP  [facility] - A DEPT OF [facility] [address] [place] [address] Dept: [phone] Dept Fax: [phone]   No orders of the defined types were placed in this encounter.     CHIEF COMPLAINT:  CC: carcinoid tumor of jejunum   Current Treatment:  lanreotide injection monthly   INTERVAL HISTORY:  Dyland is here today for repeat clinical assessment.  The patient was last seen by Dr. Mosetta Putt on 07/04/2023.  Due to elevated PSA, the patient's urologist moved forward with prostate biopsy. This was done on 07/09/2023. Multiple samples were taken. They showed chronic inflammatory changes and atrophy without evidence of malignancy. He denies fevers or chills. He denies pain. His appetite is good. His weight {Weight change:10426}.  I have reviewed the past medical history, past surgical history, social  history and family history with the patient and they are unchanged from previous note.  ALLERGIES:  has no known allergies.  MEDICATIONS:  Current  Outpatient Medications  Medication Sig Dispense Refill   amLODipine (NORVASC) 10 MG tablet Take 1 tablet (10 mg total) by mouth daily. 90 tablet 0   aspirin EC 81 MG tablet Take 1 tablet (81 mg total) by mouth 2 (two) times daily. To be taken after surgery 84 tablet 0   atorvastatin (LIPITOR) 40 MG tablet Take 1 tablet (40 mg total) by mouth daily. 90 tablet 0   carvedilol (COREG) 25 MG tablet Take 1 tablet (25 mg total) by mouth 2 (two) times daily with a meal. 180 tablet 0   chlorthalidone (HYGROTON) 25 MG tablet Take 1 tablet (25 mg total) by mouth daily. 90 tablet 0   diclofenac (VOLTAREN) 75 MG EC tablet Take 1 tablet (75 mg total) by mouth 2 (two) times daily as needed. 60 tablet 0   LANREOTIDE ACETATE [place] Inject 120 mg into the skin daily.     metFORMIN (GLUCOPHAGE) 500 MG tablet Take 1 tablet (500 mg total) by mouth daily with breakfast. 90 tablet 0   tadalafil (CIALIS) 20 MG tablet Take 0.5-1 tablets (10-20 mg total) by mouth daily as needed for erectile dysfunction. 10 tablet 11   tamsulosin (FLOMAX) 0.4 MG CAPS capsule Take 1 capsule (0.4 mg total) by mouth daily. 90 capsule 3   valsartan (DIOVAN) 320 MG tablet Take 1 tablet (320 mg total) by mouth daily. 90 tablet 0   No current facility-administered medications for this visit.    HISTORY OF PRESENT ILLNESS:   Oncology History Overview Note  Cancer Staging No matching staging information was found for the patient.    Carcinoid tumor of small intestine  06/11/2019 Imaging   US Abdomen 06/11/19  IMPRESSION: 1. There is a solid mass in the right lobe of the liver which based on current measurements may have enlarged since the prior study from 2015. Images from the previous MR at that time cannot be retrieved. Given this circumstance, it may be prudent to correlate with pre and serial post-contrast MR or CT of the liver to further evaluate. There is underlying diffuse increase in liver echogenicity, a finding indicative of  hepatic steatosis.   2. Multiple loops of fluid-filled bowel. Question a degree of ileus or enteritis.   3. Study otherwise unremarkable. Note that much of the common bile duct is obscured by gas.    06/11/2019 Imaging   CT AP W Contrast 06/11/19   IMPRESSION: 1. High-grade small bowel obstruction secondary to desmoplastic response in the central right abdominal mesentery centered on a 4 cm calcified irregular/spiculated soft tissue lesion. Abrupt small bowel transition zone is identified immediately lead adjacent to this mesenteric lesion and multiple adjacent bowel loops are tethered into this region with mesenteric edema/congestion. The loop of small bowel immediately proximal to the transition zone shows mild circumferential wall thickening but no pneumatosis. 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 2. Small lymph nodes in the abnormal right mesentery suggest additional metastatic involvement. 3. Heterogeneous liver parenchyma, likely secondary to geographic fatty deposition. There is a focal 14 mm low-density lesion in the right liver. The patient had an MRI in the [facility] on 08/09/2013 to evaluate a right liver lesion, but those images are not available. Follow-up MRI without and with contrast recommended to exclude metastatic disease. 4. Tiny sclerotic  foci in the T12 vertebral body in both femoral heads are likely benign, but close attention on follow-up recommended. 5.  Aortic Atherosclerois (ICD10-170.0)      06/12/2019 Surgery    EXPLORATORY LAPAROTOMY WITH SMALL BOWEL RESECTION by Dr. Daphine Deutscher  06/12/19    06/12/2019 Initial Biopsy   FINAL MICROSCOPIC DIAGNOSIS: 06/12/19 -  Well-differentiated neuroendocrine tumor, 5.0 cm  -  Tumor invades adjacent loops of small bowel  -  Perineural invasion and extensive lymphovascular space invasion  -  Metastatic neuroendocrine  tumor involving fourteen of thirty-two  lymph nodes (14/32)  -  Margins uninvolved by neoplasm  -  See oncology table and comment below    06/13/2019 Initial Diagnosis   Carcinoid tumor of intestine producing obstruction--resected Nov 2020   07/16/2019 PET scan   IMPRESSION: 1. Interval resection small bowel and mesenteric mass with no evidence residual mesenteric or small bowel neuroendocrine tumor. 2. Large lesion in RIGHT hepatic lobe with intense radiotracer activity consistent with well differentiated neuroendocrine tumor metastasis. Smaller lesion in the anterior LEFT hepatic lobe is concerning for second hepatic metastasis. 3. Pulmonary nodule measuring 6 mm in LEFT upper lobe is not have radiotracer activity. Smaller RIGHT upper lobe nodule. Recommend close attention on follow-up.   08/01/2019 - 05/07/2020 Chemotherapy   Lanreotide injection monthly starting 08/01/19. Stopped after 05/07/20 due to disease progression     08/04/2019 Imaging   MRI abdomen  IMPRESSION: 1. The previously noted lesion in the right lobe of the liver has grown slightly compared to 2015. In addition, today's study demonstrates 2 smaller lesions with similar imaging characteristics. Given the activity on the recent PET Dotatate scan, these lesions are all compatible with small neuroendocrine metastatic lesions. 2. Hepatic steatosis.   02/10/2020 Imaging   MRI abdomen  IMPRESSION: 1. The dominant right hepatic lobe mass has mildly increased in size compared to the prior exam. This currently measures 5.4 by 4.5 cm, previously 4.9 by 4.2 cm. 2. Nine additional small T2 hyperintense foci in the liver are stable, and while the small size makes these less specific, there likely small metastatic lesions. Today's exam has the benefit of less motion artifact compared to the 06/03/2020 MRI, and I was able to pick at each of these tiny lesions on the prior exam in retrospect. It is conceivable that 1 or more  of these tiny lesions could represent small hemangiomas, although I do not observe classic delayed enhancement pattern. 3. Diffuse hepatic steatosis.   05/31/2020 PET scan   DOTATATE PET  IMPRESSION: 1. Progression of well differentiated neuroendocrine tumor hepatic metastasis with increase in size and radiotracer activity of dominant lesion in the LEFT hepatic lobe and multiple small lesions as described above. 2. New small focus of radiotracer activity within the head of the pancreas. 3. Increased hepatic steatosis.      Chemotherapy   Lutathera treatments on 08/11/20, 10/06/20, 12/01/20, 01/26/21      04/28/2021 PET scan   NETSPOT GA 68 DOTATATE  IMPRESSION: 1. Interval decrease in radiotracer activity of neuroendocrine tumor hepatic metastasis. Dominant lesion the RIGHT hepatic lobe is mildly in size. Several small peripheral lesions now have no radiotracer activity. Lesions are conspicuous in liver on noncontrast exam due to hepatic steatosis. 2. Small lesion in the head of the pancreas ahs decreased radiotracer activity. Lesion difficult to define on CT portion exam. 3. No evidence new metastatic neuroendocrine tumor. 4. Post RIGHT hemicolectomy anatomy without complication 5. New bilateral symmetric gynecomastia.  REVIEW OF SYSTEMS:   Constitutional: Denies fevers, chills or abnormal weight loss Eyes: Denies blurriness of vision Ears, nose, mouth, throat, and face: Denies mucositis or sore throat Respiratory: Denies cough, dyspnea or wheezes Cardiovascular: Denies palpitation, chest discomfort or lower extremity swelling Gastrointestinal:  Denies nausea, heartburn or change in bowel habits Skin: Denies abnormal skin rashes Lymphatics: Denies new lymphadenopathy or easy bruising Neurological:Denies numbness, tingling or new weaknesses Behavioral/Psych: Mood is stable, no new changes  All other systems were reviewed with the patient and are negative.   VITALS:   There were no vitals taken for this visit.  Wt Readings from Last 3 Encounters:  07/04/23 249 lb 8 oz (113.2 kg)  07/03/23 239 lb (108.4 kg)  06/18/23 244 lb 3.2 oz (110.8 kg)    There is no height or weight on file to calculate BMI.  Performance status (ECOG): {CHL ONC [id number]  PHYSICAL EXAM:   GENERAL:alert, no distress and comfortable SKIN: skin color, texture, turgor are normal, no rashes or significant lesions EYES: normal, Conjunctiva are pink and non-injected, sclera clear OROPHARYNX:no exudate, no erythema and lips, buccal mucosa, and tongue normal  NECK: supple, thyroid normal size, non-tender, without nodularity LYMPH:  no palpable lymphadenopathy in the cervical, axillary or inguinal LUNGS: clear to auscultation and percussion with normal breathing effort HEART: regular rate & rhythm and no murmurs and no lower extremity edema ABDOMEN:abdomen soft, non-tender and normal bowel sounds Musculoskeletal:no cyanosis of digits and no clubbing  NEURO: alert & oriented x 3 with fluent speech, no focal motor/sensory deficits  LABORATORY DATA:  I have reviewed the data as listed    Component Value Date/Time   NA 140 07/04/2023 1011   NA 138 11/14/2019 1428   K 4.3 07/04/2023 1011   CL 102 07/04/2023 1011   CO2 32 07/04/2023 1011   GLUCOSE 172 (H) 07/04/2023 1011   BUN 23 (H) 07/04/2023 1011   BUN 17 11/14/2019 1428   CREATININE 1.59 (H) 07/04/2023 1011   CALCIUM 9.5 07/04/2023 1011   PROT 7.5 07/04/2023 1011   ALBUMIN 4.3 07/04/2023 1011   AST 12 (L) 07/04/2023 1011   ALT 15 07/04/2023 1011   ALKPHOS 79 07/04/2023 1011   BILITOT 0.6 07/04/2023 1011   GFRNONAA 51 (L) 07/04/2023 1011   GFRAA 57 (L) 02/06/2020 1050     Lab Results  Component Value Date   WBC 7.4 07/04/2023   NEUTROABS 5.4 07/04/2023   HGB 12.6 (L) 07/04/2023   HCT 38.5 (L) 07/04/2023   MCV 92.5 07/04/2023   PLT 380 07/04/2023

## 2023-09-23 NOTE — Assessment & Plan Note (Deleted)
 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 Pathology confirmed well differentiated neuroendocrine tumor, with metastasis to 14 of 32 LNs. Margins clear.  -07/16/19 PET shows two right hepatic lesion which are intensely hypermetabolic consistent with metastatic NET. There is also a smaller less avid left hepatic lesion that is concerning for metastasis which was not seen on CT from 06/11/19. No other evidence of mets -His MRI abdomen from 08/04/19 shows 3 liver lesions (0.8-4.4cm) compatible with neuroendocrine metastatic lesions.  -He was seen by Dr. Donell Beers and she did not recommend surgery due to the multiple liver mets.  -He was on monthly Lanreotide injections since 08/01/19. He was found to have progression on 05/31/20 DOTATATE PET scan, and received 4 doses of Lutathera 08/17/20 - 03/17/21. -restaging DOTATATE PET on 04/28/21 showed positive response to treatment. We discontinued his lanreotide injections after 05/13/21. -surveillance CT CAP on 04/18/22 showed stable disease -Due to persistent diarrhea, he restarted lanreotide injection in September 2023,  diarrhea has resolved.  Will continue monthly. -Restaging CT scan in April 2024 showed overall stable disease, and improved liver metastasis.  -Repeated CT scan from June 24, 2023 was done without IV contrast, his liver metastasis felt to be improved, however he has developed multiple pelvic adenopathy.  I will obtain a dotatate PET scan for further evaluation.  Given his recently elevated PSA, metastatic prostate cancer to pelvic lymph nodes is also a possibility. -the patient has seen urology since his most recent visit. He did have prostate biopsy done 07/09/2023 with multiple samples taken. He did have chronic inflammatory changes and atrophy without evidence of malignancy.

## 2023-09-25 ENCOUNTER — Inpatient Hospital Stay: Payer: No Typology Code available for payment source

## 2023-09-25 ENCOUNTER — Inpatient Hospital Stay: Payer: No Typology Code available for payment source | Attending: Hematology

## 2023-09-25 ENCOUNTER — Inpatient Hospital Stay: Payer: No Typology Code available for payment source | Admitting: Hematology

## 2023-09-25 ENCOUNTER — Telehealth: Payer: Self-pay

## 2023-09-25 DIAGNOSIS — D3A011 Benign carcinoid tumor of the jejunum: Secondary | ICD-10-CM

## 2023-09-25 NOTE — Telephone Encounter (Signed)
 Called patient due to not showing up for his lab appointment. Patient stated he forgot his app and asked if we could reschedule his appointments. Will no show patient for todays app.

## 2023-10-01 ENCOUNTER — Ambulatory Visit: Payer: Self-pay | Admitting: Family Medicine

## 2023-10-02 ENCOUNTER — Ambulatory Visit: Payer: No Typology Code available for payment source | Admitting: Urology

## 2023-10-15 ENCOUNTER — Telehealth: Payer: Self-pay | Admitting: Family Medicine

## 2023-10-15 NOTE — Telephone Encounter (Signed)
 10/01/2023 no show 02/20/2023 no show  Final warning sent via mail and mychart

## 2023-12-26 ENCOUNTER — Encounter: Payer: Self-pay | Admitting: Family Medicine

## 2023-12-26 DIAGNOSIS — I1A0 Resistant hypertension: Secondary | ICD-10-CM

## 2023-12-26 DIAGNOSIS — R7303 Prediabetes: Secondary | ICD-10-CM

## 2023-12-26 DIAGNOSIS — M1711 Unilateral primary osteoarthritis, right knee: Secondary | ICD-10-CM

## 2023-12-26 DIAGNOSIS — I10 Essential (primary) hypertension: Secondary | ICD-10-CM

## 2023-12-26 MED ORDER — VALSARTAN 320 MG PO TABS: 320.0000 mg | ORAL_TABLET | Freq: Every day | ORAL | 0 refills | Status: DC

## 2023-12-26 MED ORDER — DICLOFENAC SODIUM 75 MG PO TBEC
75.0000 mg | DELAYED_RELEASE_TABLET | Freq: Two times a day (BID) | ORAL | 0 refills | Status: DC | PRN
Start: 2023-12-26 — End: 2024-03-25

## 2023-12-26 MED ORDER — AMLODIPINE BESYLATE 10 MG PO TABS: 10.0000 mg | ORAL_TABLET | Freq: Every day | ORAL | 0 refills | Status: DC

## 2023-12-26 MED ORDER — CHLORTHALIDONE 25 MG PO TABS: 25.0000 mg | ORAL_TABLET | Freq: Every day | ORAL | 0 refills | Status: DC

## 2023-12-26 MED ORDER — METFORMIN HCL 500 MG PO TABS: 500.0000 mg | ORAL_TABLET | Freq: Every day | ORAL | 0 refills | Status: DC

## 2023-12-26 NOTE — Telephone Encounter (Signed)
 FOV  01/22/24

## 2024-01-01 ENCOUNTER — Telehealth: Payer: Self-pay | Admitting: Hematology

## 2024-01-01 ENCOUNTER — Other Ambulatory Visit: Payer: Self-pay | Admitting: Nurse Practitioner

## 2024-01-01 DIAGNOSIS — D3A011 Benign carcinoid tumor of the jejunum: Secondary | ICD-10-CM

## 2024-01-01 NOTE — Progress Notes (Signed)
 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 Pathology confirmed well differentiated neuroendocrine tumor, with metastasis to 14 of 32 LNs. Margins clear.  -07/16/19 PET shows two right hepatic lesion which are intensely hypermetabolic consistent with metastatic NET. There is also a smaller less avid left hepatic lesion that is concerning for metastasis which was not seen on CT from 06/11/19. No other evidence of mets -His MRI abdomen from 08/04/19 shows 3 liver lesions (0.8-4.4cm) compatible with neuroendocrine metastatic lesions.  -He was seen by Dr. Aron and she did not recommend surgery due to the multiple liver mets.  -He was on monthly Lanreotide injections since 08/01/19. He was found to have progression on 05/31/20 DOTATATE PET scan, and received 4 doses of Lutathera  08/17/20 - 03/17/21. -restaging DOTATATE PET on 04/28/21 showed positive response to treatment. We discontinued his lanreotide injections after 05/13/21. -surveillance CT CAP on 04/18/22 showed stable disease -Due to persistent diarrhea, he restarted lanreotide injection in September 2023,  diarrhea has resolved.  Will continue monthly. -Restaging CT scan in April 2024 showed overall stable disease, and improved liver metastasis.   -Repeated CT scan from June 24, 2023 was done without IV contrast, his liver metastasis felt to be improved, however he has developed multiple pelvic adenopathy.  A dotatate PET scan was ordered for further evaluation.  Given his recently elevated PSA, metastatic prostate cancer to pelvic lymph nodes is also a possibility.   Elevated PSA Has had workup through his urologist, including multiple prostate biopsies.  Negative for prostate cancer.  Will continue to be monitored through urologist.  Plan Labs reviewed. - CBC and CMP are unremarkable. - Chromogranin A equals 86.9. Proceed with lanreotide injection today. Due for dotatate PET scan for disease surveillance and monitoring of persistent liver lesion.  Will order as part of today's visit. Continue monthly labs, follow-up, and lanreotide injection.  The patient understands the plans discussed today and is in agreement with them.  He knows to contact our office if he develops concerns prior to his next appointment.  I provided 25 minutes of face-to-face time during this encounter and > 50% was spent counseling as documented under my assessment and plan.    Powell FORBES Lessen, NP  [facility] - A DEPT OF [facility] [address] [place] [address] Dept: [phone] Dept Fax: [phone]   Orders Placed This Encounter  Procedures   NM PET DOTATATE SKULL BASE TO MID THIGH    Standing Status:   Future    Expected Date:   01/28/2024    Expiration Date:   01/13/2025    If indicated for the ordered procedure, I authorize the administration of a radiopharmaceutical per Radiology protocol:   Yes    Preferred imaging location?:   [facility]      CHIEF COMPLAINT:  CC: Carcinoid tumor of the jejunum  Current Treatment: Lanreotide injection every month  INTERVAL HISTORY:  Dwayne Sawyer is here today for repeat clinical assessment.  He was last seen on 07/04/2023 by Dr.  Lanny.  Has not been to cancer center in several months for follow-up for lanreotide injections due to extensive workup for elevated PSA.  Over the past several weeks, he has had increased diarrhea along with some itching.  He denies abdominal pain.  Denies nausea and vomiting.  His appetite is satisfactory.  Has no new symptoms or concerns.  He denies fevers or chills. He denies pain. His weight has increased 9 pounds over last 6 months.  I have reviewed the past medical history, past surgical history, social history and family history with the patient and they are unchanged from previous note.  ALLERGIES:  has no known allergies.  MEDICATIONS:  Current Outpatient Medications  Medication Sig Dispense Refill   amLODipine  (NORVASC ) 10 MG tablet Take 1 tablet (10 mg total) by mouth daily. 90 tablet 0   aspirin  EC 81 MG tablet Take 1 tablet (81 mg total) by mouth 2 (two) times daily. To be taken after surgery 84 tablet 0   atorvastatin  (LIPITOR) 40 MG tablet Take 1 tablet (40 mg total) by mouth daily. 90 tablet 0   carvedilol  (COREG ) 25 MG tablet Take 1 tablet (25 mg total) by mouth 2 (two) times daily with a meal. 180 tablet 0   chlorthalidone  (HYGROTON ) 25 MG tablet Take 1 tablet (25 mg total) by mouth daily. 90 tablet 0   diclofenac  (VOLTAREN ) 75 MG EC tablet Take 1 tablet (75 mg total) by mouth 2 (two) times daily as needed. 60 tablet 0   LANREOTIDE ACETATE  [place] Inject 120 mg into the skin daily.     metFORMIN  (GLUCOPHAGE ) 500 MG tablet Take 1 tablet (500 mg total) by mouth daily with breakfast. 90 tablet 0   tadalafil  (CIALIS ) 20 MG tablet Take 0.5-1 tablets (10-20 mg total) by mouth daily as needed for erectile dysfunction. 10 tablet 11   tamsulosin  (FLOMAX ) 0.4 MG CAPS capsule Take 1 capsule (0.4 mg total) by mouth daily. 90 capsule 3   valsartan  (DIOVAN ) 320 MG tablet Take 1 tablet (320 mg total) by mouth daily. 90 tablet 0   No current facility-administered medications for this visit.     HISTORY OF PRESENT ILLNESS:   Oncology History Overview Note  Cancer Staging No matching staging information was found for the patient.    Carcinoid tumor of small intestine  06/11/2019 Imaging   US  Abdomen 06/11/19  IMPRESSION: 1. There is a solid mass in the right lobe of the liver which based on current measurements may have enlarged since the prior study from 2015. Images from the previous MR at that time cannot be retrieved. Given this circumstance, it may be prudent to correlate with pre and serial post-contrast MR or CT of the liver to further evaluate. There is underlying diffuse increase in liver echogenicity, a finding indicative of hepatic steatosis.   2. Multiple loops of fluid-filled bowel. Question a degree of ileus or enteritis.   3. Study otherwise unremarkable. Note that much of the common bile duct is obscured by gas.    06/11/2019 Imaging   CT AP W Contrast 06/11/19   IMPRESSION: 1. High-grade small bowel obstruction secondary to desmoplastic response in the central right abdominal mesentery centered on a 4 cm calcified irregular/spiculated soft tissue lesion. Abrupt small bowel transition zone is identified immediately lead  adjacent to this mesenteric lesion and multiple adjacent bowel loops are tethered into this region with mesenteric edema/congestion. The loop of small bowel immediately proximal to the transition zone shows mild circumferential wall thickening but no pneumatosis. 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 2. Small lymph nodes in the abnormal right mesentery suggest additional metastatic involvement. 3. Heterogeneous liver parenchyma, likely secondary to geographic fatty deposition. There is a focal 14 mm low-density lesion in the right liver. The patient had an MRI in the [facility] system on 08/09/2013 to evaluate a right liver lesion, but  those images are not available. Follow-up MRI without and with contrast recommended to exclude metastatic disease. 4. Tiny sclerotic foci in the T12 vertebral body in both femoral heads are likely benign, but close attention on follow-up recommended. 5.  Aortic Atherosclerois (ICD10-170.0)      06/12/2019 Surgery    EXPLORATORY LAPAROTOMY WITH SMALL BOWEL RESECTION by Dr. Gladis  06/12/19    06/12/2019 Initial Biopsy   FINAL MICROSCOPIC DIAGNOSIS: 06/12/19 -  Well-differentiated neuroendocrine tumor, 5.0 cm  -  Tumor invades adjacent loops of small bowel  -  Perineural invasion and extensive lymphovascular space invasion  -  Metastatic neuroendocrine tumor involving fourteen of thirty-two  lymph nodes (14/32)  -  Margins uninvolved by neoplasm  -  See oncology table and comment below    06/13/2019 Initial Diagnosis   Carcinoid tumor of intestine producing obstruction--resected Nov 2020   07/16/2019 PET scan   IMPRESSION: 1. Interval resection small bowel and mesenteric mass with no evidence residual mesenteric or small bowel neuroendocrine tumor. 2. Large lesion in RIGHT hepatic lobe with intense radiotracer activity consistent with well differentiated neuroendocrine tumor metastasis. Smaller lesion in the anterior LEFT hepatic lobe is concerning for second hepatic metastasis. 3. Pulmonary nodule measuring 6 mm in LEFT upper lobe is not have radiotracer activity. Smaller RIGHT upper lobe nodule. Recommend close attention on follow-up.   08/01/2019 - 05/07/2020 Chemotherapy   Lanreotide injection monthly starting 08/01/19. Stopped after 05/07/20 due to disease progression     08/04/2019 Imaging   MRI abdomen  IMPRESSION: 1. The previously noted lesion in the right lobe of the liver has grown slightly compared to 2015. In addition, today's study demonstrates 2 smaller lesions with similar imaging characteristics. Given the activity on the recent PET Dotatate scan, these  lesions are all compatible with small neuroendocrine metastatic lesions. 2. Hepatic steatosis.   02/10/2020 Imaging   MRI abdomen  IMPRESSION: 1. The dominant right hepatic lobe mass has mildly increased in size compared to the prior exam. This currently measures 5.4 by 4.5 cm, previously 4.9 by 4.2 cm. 2. Nine additional small T2 hyperintense foci in the liver are stable, and while the small size makes these less specific, there likely small metastatic lesions. Today's exam has the benefit of less motion artifact compared to the 06/03/2020 MRI, and I was able to pick at each of these tiny lesions on the prior exam in retrospect. It is conceivable that 1 or more of these tiny lesions could represent small hemangiomas, although I do not observe classic delayed enhancement pattern. 3. Diffuse hepatic steatosis.   05/31/2020 PET scan   DOTATATE PET  IMPRESSION: 1. Progression of well differentiated neuroendocrine tumor hepatic metastasis with increase in size and radiotracer activity of dominant lesion in the LEFT hepatic lobe and multiple small lesions as described above. 2. New small focus of radiotracer activity within the  head of the pancreas. 3. Increased hepatic steatosis.      Chemotherapy   Lutathera  treatments on 08/11/20, 10/06/20, 12/01/20, 01/26/21      04/28/2021 PET scan   NETSPOT  GA 68 DOTATATE  IMPRESSION: 1. Interval decrease in radiotracer activity of neuroendocrine tumor hepatic metastasis. Dominant lesion the RIGHT hepatic lobe is mildly in size. Several small peripheral lesions now have no radiotracer activity. Lesions are conspicuous in liver on noncontrast exam due to hepatic steatosis. 2. Small lesion in the head of the pancreas ahs decreased radiotracer activity. Lesion difficult to define on CT portion exam. 3. No evidence new metastatic neuroendocrine tumor. 4. Post RIGHT hemicolectomy anatomy without complication 5. New bilateral symmetric  gynecomastia.       REVIEW OF SYSTEMS:   Constitutional: Denies fevers, chills or abnormal weight loss Eyes: Denies blurriness of vision Ears, nose, mouth, throat, and face: Denies mucositis or sore throat Respiratory: Denies cough, dyspnea or wheezes Cardiovascular: Denies palpitation, chest discomfort or lower extremity swelling Gastrointestinal:  Denies nausea, vomiting, or heartburn. He has noted increased diarrhea and abdominal cramping.  Skin: Denies abnormal skin rashes Lymphatics: Denies new lymphadenopathy or easy bruising Neurological:Denies numbness, tingling or new weaknesses Behavioral/Psych: Mood is stable, no new changes  All other systems were reviewed with the patient and are negative.   VITALS:   Today's Vitals   01/02/24 0917 01/02/24 0937  BP: 136/88   Pulse: 72   Resp: 17   Temp: 97.8 F (36.6 C)   SpO2: 98%   Weight: 258 lb 14.4 oz (117.4 kg)   PainSc:  0-No pain   Body mass index is 38.23 kg/m.   Wt Readings from Last 3 Encounters:  01/02/24 258 lb 14.4 oz (117.4 kg)  07/04/23 249 lb 8 oz (113.2 kg)  07/03/23 239 lb (108.4 kg)    Body mass index is 38.23 kg/m.  Performance status (ECOG): 1 - Symptomatic but completely ambulatory  PHYSICAL EXAM:   GENERAL:alert, no distress and comfortable SKIN: skin color, texture, turgor are normal, no rashes or significant lesions EYES: normal, Conjunctiva are pink and non-injected, sclera clear OROPHARYNX:no exudate, no erythema and lips, buccal mucosa, and tongue normal  NECK: supple, thyroid normal size, non-tender, without nodularity LYMPH:  no palpable lymphadenopathy in the cervical, axillary or inguinal LUNGS: clear to auscultation and percussion with normal breathing effort HEART: regular rate & rhythm and no murmurs and no lower extremity edema ABDOMEN:abdomen soft, non-tender and normal bowel sounds Musculoskeletal:no cyanosis of digits and no clubbing  NEURO: alert & oriented x 3 with  fluent speech, no focal motor/sensory deficits  LABORATORY DATA:  I have reviewed the data as listed    Component Value Date/Time   NA 138 01/02/2024 0859   NA 138 11/14/2019 1428   K 4.3 01/02/2024 0859   CL 100 01/02/2024 0859   CO2 31 01/02/2024 0859   GLUCOSE 146 (H) 01/02/2024 0859   BUN 31 (H) 01/02/2024 0859   BUN 17 11/14/2019 1428   CREATININE 1.59 (H) 01/02/2024 0859   CALCIUM  9.7 01/02/2024 0859   PROT 7.6 01/02/2024 0859   ALBUMIN 4.4 01/02/2024 0859   AST 11 (L) 01/02/2024 0859   ALT 16 01/02/2024 0859   ALKPHOS 82 01/02/2024 0859   BILITOT 0.5 01/02/2024 0859   GFRNONAA 51 (L) 01/02/2024 0859   GFRAA 57 (L) 02/06/2020 1050    Lab Results  Component Value Date   WBC 7.8 01/02/2024   NEUTROABS 5.0 01/02/2024   HGB 14.0  01/02/2024   HCT 41.3 01/02/2024   MCV 86.9 01/02/2024   PLT 305 01/02/2024

## 2024-01-01 NOTE — Assessment & Plan Note (Addendum)
 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 Pathology confirmed well differentiated neuroendocrine tumor, with metastasis to 14 of 32 LNs. Margins clear.  -07/16/19 PET shows two right hepatic lesion which are intensely hypermetabolic consistent with metastatic NET. There is also a smaller less avid left hepatic lesion that is concerning for metastasis which was not seen on CT from 06/11/19. No other evidence of mets -His MRI abdomen from 08/04/19 shows 3 liver lesions (0.8-4.4cm) compatible with neuroendocrine metastatic lesions.  -He was seen by Dr. Aron and she did not recommend surgery due to the multiple liver mets.  -He was on monthly Lanreotide injections since 08/01/19. He was found to have progression on 05/31/20 DOTATATE PET scan, and received 4 doses of Lutathera  08/17/20 - 03/17/21. -restaging DOTATATE PET on 04/28/21 showed positive response to treatment. We discontinued his lanreotide injections after 05/13/21. -surveillance CT CAP on 04/18/22 showed stable disease -Due to persistent diarrhea, he restarted lanreotide injection in September 2023,  diarrhea has resolved.  Will continue monthly. -Restaging CT scan in April 2024 showed overall stable disease, and improved liver metastasis.  -Repeated CT scan from June 24, 2023 was done without IV contrast, his liver metastasis felt to be improved, however he has developed multiple pelvic adenopathy.  A dotatate PET scan was ordered for further evaluation.   - Elevated PSA has been thoroughly investigated by patient's urologist.  He has had MRI of the prostate along with multiple prostate biopsies.  All resulted in normal/benign results.  He will continue to be managed through his urologist. -Plan to continue with labs follow-up, and lanreotide injections monthly.  Dotatate PET ordered as part of today's visit.  Will  adjust treatment as indicated based on PET scan results.

## 2024-01-02 ENCOUNTER — Inpatient Hospital Stay (HOSPITAL_BASED_OUTPATIENT_CLINIC_OR_DEPARTMENT_OTHER): Admitting: Nurse Practitioner

## 2024-01-02 ENCOUNTER — Inpatient Hospital Stay

## 2024-01-02 ENCOUNTER — Inpatient Hospital Stay: Attending: Hematology

## 2024-01-02 VITALS — BP 156/86 | HR 65 | Temp 98.6°F | Resp 18

## 2024-01-02 VITALS — BP 136/88 | HR 72 | Temp 97.8°F | Resp 17 | Wt 258.9 lb

## 2024-01-02 DIAGNOSIS — K769 Liver disease, unspecified: Secondary | ICD-10-CM

## 2024-01-02 DIAGNOSIS — C7B8 Other secondary neuroendocrine tumors: Secondary | ICD-10-CM | POA: Diagnosis not present

## 2024-01-02 DIAGNOSIS — C7A8 Other malignant neuroendocrine tumors: Secondary | ICD-10-CM | POA: Diagnosis present

## 2024-01-02 DIAGNOSIS — D3A011 Benign carcinoid tumor of the jejunum: Secondary | ICD-10-CM | POA: Diagnosis not present

## 2024-01-02 LAB — CBC WITH DIFFERENTIAL (CANCER CENTER ONLY)
Abs Immature Granulocytes: 0.02 10*3/uL (ref 0.00–0.07)
Basophils Absolute: 0.1 10*3/uL (ref 0.0–0.1)
Basophils Relative: 1 %
Eosinophils Absolute: 0.3 10*3/uL (ref 0.0–0.5)
Eosinophils Relative: 4 %
HCT: 41.3 % (ref 39.0–52.0)
Hemoglobin: 14 g/dL (ref 13.0–17.0)
Immature Granulocytes: 0 %
Lymphocytes Relative: 21 %
Lymphs Abs: 1.6 10*3/uL (ref 0.7–4.0)
MCH: 29.5 pg (ref 26.0–34.0)
MCHC: 33.9 g/dL (ref 30.0–36.0)
MCV: 86.9 fL (ref 80.0–100.0)
Monocytes Absolute: 0.8 10*3/uL (ref 0.1–1.0)
Monocytes Relative: 10 %
Neutro Abs: 5 10*3/uL (ref 1.7–7.7)
Neutrophils Relative %: 64 %
Platelet Count: 305 10*3/uL (ref 150–400)
RBC: 4.75 MIL/uL (ref 4.22–5.81)
RDW: 13.1 % (ref 11.5–15.5)
WBC Count: 7.8 10*3/uL (ref 4.0–10.5)
nRBC: 0 % (ref 0.0–0.2)

## 2024-01-02 LAB — CMP (CANCER CENTER ONLY)
ALT: 16 U/L (ref 0–44)
AST: 11 U/L — ABNORMAL LOW (ref 15–41)
Albumin: 4.4 g/dL (ref 3.5–5.0)
Alkaline Phosphatase: 82 U/L (ref 38–126)
Anion gap: 7 (ref 5–15)
BUN: 31 mg/dL — ABNORMAL HIGH (ref 6–20)
CO2: 31 mmol/L (ref 22–32)
Calcium: 9.7 mg/dL (ref 8.9–10.3)
Chloride: 100 mmol/L (ref 98–111)
Creatinine: 1.59 mg/dL — ABNORMAL HIGH (ref 0.61–1.24)
GFR, Estimated: 51 mL/min — ABNORMAL LOW (ref >60)
Glucose, Bld: 146 mg/dL — ABNORMAL HIGH (ref 70–99)
Potassium: 4.3 mmol/L (ref 3.5–5.1)
Sodium: 138 mmol/L (ref 135–145)
Total Bilirubin: 0.5 mg/dL (ref 0.0–1.2)
Total Protein: 7.6 g/dL (ref 6.5–8.1)

## 2024-01-02 MED ORDER — LANREOTIDE ACETATE 120 MG/0.5ML ~~LOC~~ SOLN
120.0000 mg | Freq: Once | SUBCUTANEOUS | Status: AC
  Administered 2024-01-02: 120 mg via SUBCUTANEOUS
  Filled 2024-01-02: qty 120

## 2024-01-09 ENCOUNTER — Encounter: Payer: Self-pay | Admitting: Nurse Practitioner

## 2024-01-14 ENCOUNTER — Encounter: Payer: Self-pay | Admitting: Oncology

## 2024-01-14 ENCOUNTER — Encounter: Payer: Self-pay | Admitting: Nurse Practitioner

## 2024-01-17 ENCOUNTER — Encounter: Payer: Self-pay | Admitting: Nurse Practitioner

## 2024-01-21 ENCOUNTER — Telehealth: Payer: Self-pay | Admitting: Nurse Practitioner

## 2024-01-21 NOTE — Telephone Encounter (Signed)
 Scheduled appointments per 6/11 los. Talked with the patient and he is aware of all made appointments.

## 2024-01-22 ENCOUNTER — Ambulatory Visit: Admitting: Family Medicine

## 2024-01-22 ENCOUNTER — Encounter: Payer: Self-pay | Admitting: Family Medicine

## 2024-01-22 ENCOUNTER — Ambulatory Visit: Payer: Self-pay | Admitting: Family Medicine

## 2024-01-22 VITALS — BP 118/76 | HR 71 | Temp 97.1°F | Ht 69.0 in | Wt 258.4 lb

## 2024-01-22 DIAGNOSIS — D3A011 Benign carcinoid tumor of the jejunum: Secondary | ICD-10-CM

## 2024-01-22 DIAGNOSIS — Z7984 Long term (current) use of oral hypoglycemic drugs: Secondary | ICD-10-CM

## 2024-01-22 DIAGNOSIS — E119 Type 2 diabetes mellitus without complications: Secondary | ICD-10-CM | POA: Diagnosis not present

## 2024-01-22 DIAGNOSIS — E785 Hyperlipidemia, unspecified: Secondary | ICD-10-CM

## 2024-01-22 DIAGNOSIS — I1A0 Resistant hypertension: Secondary | ICD-10-CM

## 2024-01-22 DIAGNOSIS — Z1211 Encounter for screening for malignant neoplasm of colon: Secondary | ICD-10-CM

## 2024-01-22 DIAGNOSIS — F640 Transsexualism: Secondary | ICD-10-CM

## 2024-01-22 DIAGNOSIS — Z0000 Encounter for general adult medical examination without abnormal findings: Secondary | ICD-10-CM | POA: Diagnosis not present

## 2024-01-22 LAB — URINALYSIS, ROUTINE W REFLEX MICROSCOPIC
Bilirubin Urine: NEGATIVE
Hgb urine dipstick: NEGATIVE
Ketones, ur: NEGATIVE
Leukocytes,Ua: NEGATIVE
Nitrite: NEGATIVE
Specific Gravity, Urine: 1.025 (ref 1.000–1.030)
Total Protein, Urine: NEGATIVE
Urine Glucose: NEGATIVE
Urobilinogen, UA: 0.2 (ref 0.0–1.0)
pH: 5.5 (ref 5.0–8.0)

## 2024-01-22 LAB — BASIC METABOLIC PANEL WITH GFR
BUN: 30 mg/dL — ABNORMAL HIGH (ref 6–23)
CO2: 30 meq/L (ref 19–32)
Calcium: 9.8 mg/dL (ref 8.4–10.5)
Chloride: 99 meq/L (ref 96–112)
Creatinine, Ser: 1.56 mg/dL — ABNORMAL HIGH (ref 0.40–1.50)
GFR: 49.7 mL/min — ABNORMAL LOW (ref >60.00)
Glucose, Bld: 116 mg/dL — ABNORMAL HIGH (ref 70–99)
Potassium: 3.6 meq/L (ref 3.5–5.1)
Sodium: 138 meq/L (ref 135–145)

## 2024-01-22 LAB — MICROALBUMIN / CREATININE URINE RATIO
Creatinine,U: 133.9 mg/dL
Microalb Creat Ratio: 6.7 mg/g (ref 0.0–30.0)
Microalb, Ur: 0.9 mg/dL (ref 0.0–1.9)

## 2024-01-22 LAB — LIPID PANEL
Cholesterol: 186 mg/dL (ref 0–200)
HDL: 36.8 mg/dL — ABNORMAL LOW (ref >39.00)
LDL Cholesterol: 99 mg/dL (ref 0–99)
NonHDL: 149.26
Total CHOL/HDL Ratio: 5
Triglycerides: 250 mg/dL — ABNORMAL HIGH (ref 0.0–149.0)
VLDL: 50 mg/dL — ABNORMAL HIGH (ref 0.0–40.0)

## 2024-01-22 LAB — HEMOGLOBIN A1C: Hgb A1c MFr Bld: 7.4 % — ABNORMAL HIGH (ref 4.6–6.5)

## 2024-01-22 MED ORDER — VALSARTAN 320 MG PO TABS
320.0000 mg | ORAL_TABLET | Freq: Every day | ORAL | 3 refills | Status: DC
Start: 2024-01-22 — End: 2024-03-25

## 2024-01-22 MED ORDER — METFORMIN HCL 500 MG PO TABS
500.0000 mg | ORAL_TABLET | Freq: Every day | ORAL | 3 refills | Status: DC
Start: 2024-01-22 — End: 2024-01-28

## 2024-01-22 MED ORDER — OZEMPIC (0.25 OR 0.5 MG/DOSE) 2 MG/3ML ~~LOC~~ SOPN: PEN_INJECTOR | SUBCUTANEOUS | 0 refills | Status: DC

## 2024-01-22 MED ORDER — AMLODIPINE BESYLATE 10 MG PO TABS: 10.0000 mg | ORAL_TABLET | Freq: Every day | ORAL | 3 refills | Status: DC

## 2024-01-22 MED ORDER — CHLORTHALIDONE 25 MG PO TABS
25.0000 mg | ORAL_TABLET | Freq: Every day | ORAL | 3 refills | Status: DC
Start: 2024-01-22 — End: 2024-03-25

## 2024-01-22 NOTE — Assessment & Plan Note (Signed)
 Overall health is fair. Recommend regular exercise and discussed non-weight bearing approaches.. Discussed recommended screenings and immunizations.

## 2024-01-22 NOTE — Assessment & Plan Note (Signed)
 Blood pressure is at goal today. Continue amlodipine  10 mg daily, carvedilol  25 mg bid, valsartan  320 mg daily and chlorthalidone  25 mg daily.

## 2024-01-22 NOTE — Assessment & Plan Note (Signed)
I will check lipids today. Continue atorvastatin 40 mg daily.

## 2024-01-22 NOTE — Assessment & Plan Note (Signed)
 Remains in remission. Continue lantreotide for symptom management.

## 2024-01-22 NOTE — Progress Notes (Signed)
 [facility] [address] [place] [address] Dept: [phone] Dept Fax: [phone]  Annual Physical Visit  Subjective:    Patient ID: Dwayne Sawyer, male    DOB: June 07, 1971, 53 y.o..   MRN: [id number]  Chief Complaint  Patient presents with   Annual Exam    CPE/labs.  C/o weight loss.    History of Present Illness:  Patient is in today for an annual physical/preventative visit.  Dwayne Sawyer  has a history of resistant hypertension. He is currently managed on amlodipine  10 mg daily, carvedilol  25 mg bid, valsartan  320 mg daily and chlorthalidone  25 mg daily.    Dwayne Sawyer has a history of hyperlipidemia. He is  managed on atorvastatin  40 mg daily.   Dwayne Sawyer has a history of Type 2 diabetes. He is managed on metformin  500 mg daily.    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 He is currently in remission. He is feeling well overall. He continues lanreotide for management of diarrhea.    Dwayne Sawyer identifies as non-binary gender. He has previously been on estrogen and progesterone, which he received through a web-based medical source. He felt the hormones helped him related to his connection with his feminine side. He had gone off of these medications last year. He has been interested in seeing someone locally related to restarting these. He has no interest in transitioning M2F. He was found to have an elevated PSA level last fall. His evaluation did not find any prostate cancer present. He continues to follow with urology.  Review of Systems  Constitutional:  Negative for chills, diaphoresis, fever, malaise/fatigue and weight loss.  HENT:  Negative for congestion, ear pain, hearing loss, sinus pain, sore throat and tinnitus.   Eyes:  Negative for blurred vision, pain, discharge and redness.  Respiratory:  Negative for cough, shortness of breath and wheezing.    Cardiovascular:  Negative for chest pain and palpitations.  Gastrointestinal:  Positive for diarrhea. Negative for abdominal pain, constipation, heartburn, nausea and vomiting.       History of carcinoid tumor. Diarrhea is managed with lanreotide.  Musculoskeletal:  Positive for joint pain. Negative for back pain and myalgias.       Prior right knee joint replacement. Has arthritis in the left knee. Currently is able to tolerate, so has not pushed for having the joint replaced.  Skin:  Negative for itching and rash.  Psychiatric/Behavioral:  Negative for depression. The patient is not nervous/anxious.    Past Medical History: Patient Active Problem List   Diagnosis Date Noted   Non-binary gender 06/18/2023   Elevated PSA, greater than or equal to 20 ng/ml 06/18/2023   Erectile dysfunction 11/21/2022   Morbid obesity, associated with hypertension, hyperlipidemia, and osteoarthritis (HCC) 09/15/2021   Status post total right knee replacement 09/07/2021   BMI 37.0-37.9, adult 12/10/2020   Obstructive sleep apnea syndrome, severe 11/29/2020   Hyperlipidemia 09/06/2020   Resistant hypertension 09/06/2020   Type 2 diabetes mellitus (HCC) 09/06/2020   Carcinoid tumor of small intestine 06/13/2019   Rheumatoid arthritis involving multiple sites with positive rheumatoid factor (HCC) 10/13/2015   Liver lesion 10/13/2015   Past Surgical History:  Procedure Laterality Date   EYE SURGERY Right    pt says we he was a baby surgery had to be done on one eye because it wasn't as strong as the other   HERNIA REPAIR  1988  inguinal- pt doesn't remember which side   LAPAROTOMY N/A 06/12/2019   Procedure: EXPLORATORY LAPAROTOMY WITH SMALL BOWEL RESECTION;  Surgeon: Gladis Cough, MD;  Location: [facility];  Service: General;  Laterality: N/A;   SHOULDER SURGERY Right 2001   rotator cuff repair   SMALL INTESTINE SURGERY  06/12/2019   part of ex lap procedure. pt states 3 feet of intestine was  removed   TONSILLECTOMY     as a child   TOTAL KNEE ARTHROPLASTY Right 09/07/2021   Procedure: RIGHT TOTAL KNEE ARTHROPLASTY;  Surgeon: Jerri Kay HERO, MD;  Location: [facility];  Service: Orthopedics;  Laterality: Right;   Family History  Problem Relation Age of Onset   Cancer Father        widespread carcinoid cancer   Hypertension Mother    Hyperlipidemia Mother    Outpatient Medications Prior to Visit  Medication Sig Dispense Refill   atorvastatin  (LIPITOR) 40 MG tablet Take 1 tablet (40 mg total) by mouth daily. 90 tablet 0   carvedilol  (COREG ) 25 MG tablet Take 1 tablet (25 mg total) by mouth 2 (two) times daily with a meal. 180 tablet 0   diclofenac  (VOLTAREN ) 75 MG EC tablet Take 1 tablet (75 mg total) by mouth 2 (two) times daily as needed. 60 tablet 0   LANREOTIDE ACETATE  [place] Inject 120 mg into the skin daily.     tadalafil  (CIALIS ) 20 MG tablet Take 0.5-1 tablets (10-20 mg total) by mouth daily as needed for erectile dysfunction. 10 tablet 11   tamsulosin  (FLOMAX ) 0.4 MG CAPS capsule Take 1 capsule (0.4 mg total) by mouth daily. 90 capsule 3   amLODipine  (NORVASC ) 10 MG tablet Take 1 tablet (10 mg total) by mouth daily. 90 tablet 0   aspirin  EC 81 MG tablet Take 1 tablet (81 mg total) by mouth 2 (two) times daily. To be taken after surgery 84 tablet 0   chlorthalidone  (HYGROTON ) 25 MG tablet Take 1 tablet (25 mg total) by mouth daily. 90 tablet 0   metFORMIN  (GLUCOPHAGE ) 500 MG tablet Take 1 tablet (500 mg total) by mouth daily with breakfast. 90 tablet 0   valsartan  (DIOVAN ) 320 MG tablet Take 1 tablet (320 mg total) by mouth daily. 90 tablet 0   No facility-administered medications prior to visit.   No Known Allergies Objective:   Today's Vitals   01/22/24 1304  BP: 118/76  Pulse: 71  Temp: (!) 97.1 F (36.2 C)  TempSrc: Temporal  SpO2: 97%  Weight: 258 lb 6.4 oz (117.2 kg)  Height: 5' 9 (1.753 m)   Body mass index is 38.16 kg/m.   General: Well developed, well  nourished. No acute distress. HEENT: Normocephalic, non-traumatic. PERRL, EOMI. Conjunctiva clear. External ears normal. EAC and TMs normal   bilaterally. Nose clear without congestion or rhinorrhea. Mucous membranes moist. Oropharynx clear. Good dentition. Neck: Supple. No lymphadenopathy. No thyromegaly. Lungs: Clear to auscultation bilaterally. No wheezing, rales or rhonchi. CV: RRR without murmurs or rubs. Pulses 2+ bilaterally. Abdomen: Soft, non-tender. Bowel sounds positive, normal pitch and frequency. No hepatosplenomegaly. No rebound   or guarding. Extremities: Full ROM. No joint swelling or tenderness. No edema noted. Skin: Warm and dry. No rashes. Psych: Alert and oriented. Normal mood and affect.  Health Maintenance Due  Topic Date Due   Diabetic kidney evaluation - Urine ACR  Never done   Hepatitis B Vaccines (1 of 3 - 19+ 3-dose series) Never done   OPHTHALMOLOGY EXAM  11/23/2022  Colonoscopy  07/25/2023   FOOT EXAM  08/23/2023   HEMOGLOBIN A1C  12/16/2023     Assessment & Plan:   Problem List Items Addressed This Visit       Cardiovascular and Mediastinum   Resistant hypertension   Blood pressure is at goal today. Continue amlodipine  10 mg daily, carvedilol  25 mg bid, valsartan  320 mg daily and chlorthalidone  25 mg daily.      Relevant Medications   chlorthalidone  (HYGROTON ) 25 MG tablet   amLODipine  (NORVASC ) 10 MG tablet   valsartan  (DIOVAN ) 320 MG tablet   Other Relevant Orders   Microalbumin / creatinine urine ratio   Basic metabolic panel with GFR     Digestive   Carcinoid tumor of small intestine   Remains in remission. Continue lantreotide for symptom management.        Endocrine   Type 2 diabetes mellitus (HCC)   We will check annual DM labs today. Continue metformin  500 mg daily. Would consider seeing if we can get Dwayne Sawyer on a GLP-1 RA if A1c warrants.      Relevant Medications   metFORMIN  (GLUCOPHAGE ) 500 MG tablet   valsartan   (DIOVAN ) 320 MG tablet   Other Relevant Orders   Microalbumin / creatinine urine ratio   Basic metabolic panel with GFR   Hemoglobin A1c   Urinalysis, Routine w reflex microscopic     Other   Annual physical exam - Primary   Overall health is fair. Recommend regular exercise and discussed non-weight bearing approaches.. Discussed recommended screenings and immunizations.       Hyperlipidemia   I will check lipids today. Continue atorvastatin  40 mg daily      Relevant Medications   chlorthalidone  (HYGROTON ) 25 MG tablet   amLODipine  (NORVASC ) 10 MG tablet   valsartan  (DIOVAN ) 320 MG tablet   Other Relevant Orders   Lipid panel   Non-binary gender   I will refer Dwayne Sawyer to endocrinology to discuss resuming hormone therapy.      Relevant Orders   Ambulatory referral to Endocrinology   Other Visit Diagnoses       Screening for colon cancer       Relevant Orders   Ambulatory referral to Gastroenterology       Return in about 3 months (around 04/23/2024) for Reassessment.   Garnette CHRISTELLA Simpler, MD

## 2024-01-22 NOTE — Assessment & Plan Note (Signed)
 I will refer Mr. Zuhlke to endocrinology to discuss resuming hormone therapy.

## 2024-01-22 NOTE — Assessment & Plan Note (Addendum)
 We will check annual DM labs today. Continue metformin  500 mg daily. Would consider seeing if we can get Mr. Dwayne Sawyer on a GLP-1 RA if A1c warrants.

## 2024-01-23 ENCOUNTER — Encounter: Payer: Self-pay | Admitting: Oncology

## 2024-01-23 ENCOUNTER — Telehealth: Payer: Self-pay

## 2024-01-23 ENCOUNTER — Other Ambulatory Visit (HOSPITAL_COMMUNITY): Payer: Self-pay

## 2024-01-23 DIAGNOSIS — E119 Type 2 diabetes mellitus without complications: Secondary | ICD-10-CM

## 2024-01-23 NOTE — Telephone Encounter (Signed)
 Pharmacy Patient Advocate Encounter   Received notification from CoverMyMeds that prior authorization for Ozempic  (0.25 or 0.5 MG/DOSE) 2MG /3ML pen-injectors  is required/requested.   Insurance verification completed.   The patient is insured through [facility] .   Per test claim: PA required; PA started via CoverMyMeds. KEY [id number] . Waiting for clinical questions to populate.

## 2024-01-24 NOTE — Telephone Encounter (Signed)
 PLEASE BE ADVISED Clinical questions have been answered and PA submitted.TO PLAN. PA currently Pending.

## 2024-01-28 ENCOUNTER — Other Ambulatory Visit (HOSPITAL_COMMUNITY): Payer: Self-pay

## 2024-01-28 MED ORDER — METFORMIN HCL 500 MG PO TABS: 500.0000 mg | ORAL_TABLET | Freq: Two times a day (BID) | ORAL | 3 refills | Status: AC

## 2024-01-28 NOTE — Addendum Note (Signed)
 Addended by: THEDORA GARNETTE HERO on: 01/28/2024 03:07 PM   Modules accepted: Orders

## 2024-01-28 NOTE — Telephone Encounter (Signed)
 Patient notified VIA phone. Dm/cma

## 2024-01-28 NOTE — Telephone Encounter (Signed)
 Pharmacy Patient Advocate Encounter  Received notification from [facility] that Prior Authorization for Ozempic  (0.25 or 0.5 MG/DOSE) 2MG /3ML pen-injectors  has been DENIED.  No reason given; No denial letter received via Fax or CMM. It has been requested and will be uploaded to the media tab once received.  Message from Plan Denied

## 2024-01-28 NOTE — Telephone Encounter (Signed)
 Since they denied the Ozempic  again.  Should he increase the Metformin  to 2 tablets daily instead to 1 daily?  Please review and advise.  Thanks. Dm/cma

## 2024-01-30 ENCOUNTER — Inpatient Hospital Stay: Attending: Hematology

## 2024-01-30 VITALS — BP 124/92 | HR 67 | Resp 17

## 2024-01-30 DIAGNOSIS — C7A011 Malignant carcinoid tumor of the jejunum: Secondary | ICD-10-CM | POA: Diagnosis present

## 2024-01-30 DIAGNOSIS — C7B02 Secondary carcinoid tumors of liver: Secondary | ICD-10-CM | POA: Insufficient documentation

## 2024-01-30 DIAGNOSIS — D3A011 Benign carcinoid tumor of the jejunum: Secondary | ICD-10-CM

## 2024-01-30 MED ORDER — LANREOTIDE ACETATE 120 MG/0.5ML ~~LOC~~ SOLN
120.0000 mg | Freq: Once | SUBCUTANEOUS | Status: AC
  Administered 2024-01-30: 120 mg via SUBCUTANEOUS
  Filled 2024-01-30: qty 120

## 2024-01-31 ENCOUNTER — Encounter (HOSPITAL_COMMUNITY): Payer: Self-pay

## 2024-01-31 ENCOUNTER — Encounter (HOSPITAL_COMMUNITY): Admission: RE | Admit: 2024-01-31 | Source: Ambulatory Visit

## 2024-02-13 ENCOUNTER — Encounter: Payer: Self-pay | Admitting: Family Medicine

## 2024-02-14 NOTE — Telephone Encounter (Addendum)
 Called [facility] and spoke with Olam. I informed her why I was calling. She informed me that if the colonoscopy is over 55 years old then the patient can get scheduled for appointment. She stated that she or someone will give the patient a call to get him scheduled for an appointment.

## 2024-02-18 ENCOUNTER — Encounter (HOSPITAL_COMMUNITY)
Admission: RE | Admit: 2024-02-18 | Discharge: 2024-02-18 | Disposition: A | Discharge status: Home / Self Care | Source: Ambulatory Visit | Attending: Nurse Practitioner | Admitting: Nurse Practitioner

## 2024-02-18 DIAGNOSIS — K769 Liver disease, unspecified: Secondary | ICD-10-CM | POA: Insufficient documentation

## 2024-02-18 MED ORDER — COPPER CU 64 DOTATATE 1 MCI/ML IV SOLN
4.0000 | Freq: Once | INTRAVENOUS | Status: AC
  Administered 2024-02-18: 4.15 via INTRAVENOUS

## 2024-02-26 ENCOUNTER — Other Ambulatory Visit: Payer: Self-pay

## 2024-02-26 DIAGNOSIS — R972 Elevated prostate specific antigen [PSA]: Secondary | ICD-10-CM

## 2024-02-27 ENCOUNTER — Inpatient Hospital Stay: Attending: Hematology

## 2024-02-27 VITALS — BP 130/83 | HR 70 | Resp 17

## 2024-02-27 DIAGNOSIS — C7B02 Secondary carcinoid tumors of liver: Secondary | ICD-10-CM | POA: Diagnosis not present

## 2024-02-27 DIAGNOSIS — C7A011 Malignant carcinoid tumor of the jejunum: Secondary | ICD-10-CM | POA: Diagnosis present

## 2024-02-27 DIAGNOSIS — D3A011 Benign carcinoid tumor of the jejunum: Secondary | ICD-10-CM

## 2024-02-27 MED ORDER — LANREOTIDE ACETATE 120 MG/0.5ML ~~LOC~~ SOLN
120.0000 mg | Freq: Once | SUBCUTANEOUS | Status: AC
  Administered 2024-02-27: 120 mg via SUBCUTANEOUS
  Filled 2024-02-27: qty 120

## 2024-02-28 ENCOUNTER — Other Ambulatory Visit

## 2024-02-28 ENCOUNTER — Other Ambulatory Visit: Payer: Self-pay

## 2024-02-28 DIAGNOSIS — R972 Elevated prostate specific antigen [PSA]: Secondary | ICD-10-CM

## 2024-02-29 ENCOUNTER — Ambulatory Visit: Payer: Self-pay | Admitting: Urology

## 2024-02-29 LAB — PSA: Prostate Specific Ag, Serum: 6.9 ng/mL — ABNORMAL HIGH (ref 0.0–4.0)

## 2024-03-07 ENCOUNTER — Encounter: Payer: Self-pay | Admitting: Urology

## 2024-03-07 ENCOUNTER — Ambulatory Visit (INDEPENDENT_AMBULATORY_CARE_PROVIDER_SITE_OTHER): Admitting: Urology

## 2024-03-07 VITALS — BP 138/93 | HR 68 | Ht 69.0 in | Wt 256.0 lb

## 2024-03-07 DIAGNOSIS — N529 Male erectile dysfunction, unspecified: Secondary | ICD-10-CM

## 2024-03-07 DIAGNOSIS — N138 Other obstructive and reflux uropathy: Secondary | ICD-10-CM

## 2024-03-07 DIAGNOSIS — R972 Elevated prostate specific antigen [PSA]: Secondary | ICD-10-CM | POA: Diagnosis not present

## 2024-03-07 DIAGNOSIS — N401 Enlarged prostate with lower urinary tract symptoms: Secondary | ICD-10-CM | POA: Diagnosis not present

## 2024-03-07 MED ORDER — FINASTERIDE 5 MG PO TABS: 5.0000 mg | ORAL_TABLET | Freq: Every day | ORAL | 11 refills | Status: AC

## 2024-03-07 MED ORDER — TAMSULOSIN HCL 0.4 MG PO CAPS: 0.4000 mg | ORAL_CAPSULE | Freq: Every day | ORAL | 11 refills | Status: AC

## 2024-03-07 MED ORDER — TADALAFIL 20 MG PO TABS: 10.0000 mg | ORAL_TABLET | Freq: Every day | ORAL | 11 refills | Status: AC | PRN

## 2024-03-07 NOTE — Addendum Note (Signed)
 Addended by: OBADIAH ROSELEE RAMAN on: 03/07/2024 04:00 PM   Modules accepted: Orders

## 2024-03-07 NOTE — Progress Notes (Signed)
 Assessment: 1. Elevated PSA - negative biopsy 12/24; negative MRI 2/25   2. BPH with obstruction/lower urinary tract symptoms   3. Organic impotence     Plan: I personally reviewed the patient's chart including provider notes, lab and imaging results. I discussed options for management of his BPH with lower urinary tract symptoms.  He does have a large volume prostate on MRI. He would like to begin combination therapy with tamsulosin  0.4 mg daily and finasteride  5 mg daily.  Use and side effects discussed.  Prescription sent. Continue tadalafil  20 mg as needed for erectile dysfunction. Return to office in 6 months with free and total PSA.  Chief Complaint: Chief Complaint  Patient presents with   Elevated PSA    HPI: Dwayne Sawyer is a 53 y.o. male who presents for continued evaluation of elevated PSA. He was previously seen by Dr. Shona with his initial visit in 12/24. Urologic history: Past medical history is remarkable for hypertension on multiple drugs, hyperlipidemia, type 2 diabetes, and metastatic carcinoid involving the small bowel and liver currently in remission.   Patient also is nonbinary gender previously on estrogen and progesterone which he obtained from an online source.  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  He went off these meds last year.  He was interested in restarting but has no interest in transitioning.   PSA 06/2023 = 29.77   CT abdomen pelvis demonstrates Progressive pelvic adenopathy suspicious for nodal mets Systematic prostate biopsy 06/2023 was negative for tumor.  He was found to have a large gland 70 g volume which is generally firm. MRI of the prostate from 2/25 showed no focal lesion of intermediate or higher suspicion within the prostate. PET scan from 7/25 showed evidence of a neuroendocrine tumor in the right hepatic lobe without evidence of metastatic disease. PSA 8/25: 6.9  He returns today for follow-up.  He continues with  lower urinary tract symptoms including frequency, urgency, nocturia x 2, decreased stream, and intermittent stream.  No dysuria or gross hematuria.  He has used tamsulosin  sporadically with improvement in his symptoms. IPSS = 14/2.  Portions of the above documentation were copied from a prior visit for review purposes only.  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)    type 2- pt was told he was pre-diabetic   Family history of carcinoid tumor    High cholesterol    Hypertension     PSH: Past Surgical History:  Procedure Laterality Date   EYE SURGERY Right    pt says we he was a baby surgery had to be done on one eye because it wasn't as strong as the other   HERNIA REPAIR  1988   inguinal- pt doesn't remember which side   LAPAROTOMY N/A 06/12/2019   Procedure: EXPLORATORY LAPAROTOMY WITH SMALL BOWEL RESECTION;  Surgeon: Gladis Cough, MD;  Location: [facility];  Service: General;  Laterality: N/A;   SHOULDER SURGERY Right 2001   rotator cuff repair   SMALL INTESTINE SURGERY  06/12/2019   part of ex lap procedure. pt states 3 feet of intestine was removed   TONSILLECTOMY     as a child   TOTAL KNEE ARTHROPLASTY Right 09/07/2021   Procedure: RIGHT TOTAL KNEE ARTHROPLASTY;  Surgeon: Jerri Kay HERO, MD;  Location: [facility];  Service: Orthopedics;  Laterality: Right;  SH: Social History   Tobacco Use   Smoking status: Never   Smokeless tobacco: Former    Types: Chew    Quit date: 2020  Vaping Use   Vaping status: Never Used  Substance Use Topics   Alcohol use: Yes    Comment: very occasionally (less than a 6 pack of beer a year)   Drug use: Never    ROS: Constitutional:  Negative for fever, chills, weight loss CV: Negative for chest pain, previous MI, hypertension Respiratory:  Negative for shortness of breath, wheezing, sleep apnea, frequent  cough GI:  Negative for nausea, vomiting, bloody stool, GERD  PE: BP (!) 138/93   Pulse 68   Ht 5' 9 (1.753 m)   Wt 256 lb (116.1 kg)   BMI 37.80 kg/m  GENERAL APPEARANCE:  Well appearing, well developed, well nourished, NAD HEENT:  Atraumatic, normocephalic, oropharynx clear NECK:  Supple without lymphadenopathy or thyromegaly ABDOMEN:  Soft, non-tender, no masses EXTREMITIES:  Moves all extremities well, without clubbing, cyanosis, or edema NEUROLOGIC:  Alert and oriented x 3, normal gait, CN II-XII grossly intact MENTAL STATUS:  appropriate BACK:  Non-tender to palpation, No CVAT SKIN:  Warm, dry, and intact   Results: No sample provided

## 2024-03-25 ENCOUNTER — Other Ambulatory Visit: Payer: Self-pay

## 2024-03-25 ENCOUNTER — Encounter: Payer: Self-pay | Admitting: Family Medicine

## 2024-03-25 DIAGNOSIS — D3A011 Benign carcinoid tumor of the jejunum: Secondary | ICD-10-CM

## 2024-03-25 DIAGNOSIS — I1A0 Resistant hypertension: Secondary | ICD-10-CM

## 2024-03-25 DIAGNOSIS — I10 Essential (primary) hypertension: Secondary | ICD-10-CM

## 2024-03-25 DIAGNOSIS — M1711 Unilateral primary osteoarthritis, right knee: Secondary | ICD-10-CM

## 2024-03-25 MED ORDER — CARVEDILOL 25 MG PO TABS: 25.0000 mg | ORAL_TABLET | Freq: Two times a day (BID) | ORAL | 11 refills | Status: AC

## 2024-03-25 MED ORDER — DICLOFENAC SODIUM 75 MG PO TBEC: 75.0000 mg | DELAYED_RELEASE_TABLET | Freq: Two times a day (BID) | ORAL | 1 refills | Status: DC | PRN

## 2024-03-25 MED ORDER — VALSARTAN 320 MG PO TABS: 320.0000 mg | ORAL_TABLET | Freq: Every day | ORAL | 11 refills | Status: AC

## 2024-03-25 MED ORDER — AMLODIPINE BESYLATE 10 MG PO TABS
10.0000 mg | ORAL_TABLET | Freq: Every day | ORAL | 11 refills | Status: AC
Start: 2024-03-25 — End: ?

## 2024-03-25 MED ORDER — CHLORTHALIDONE 25 MG PO TABS: 25.0000 mg | ORAL_TABLET | Freq: Every day | ORAL | 11 refills | Status: AC

## 2024-03-25 NOTE — Assessment & Plan Note (Signed)
 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 Pathology confirmed well differentiated neuroendocrine tumor, with metastasis to 14 of 32 LNs. Margins clear.  -07/16/19 PET shows two right hepatic lesion which are intensely hypermetabolic consistent with metastatic NET. There is also a smaller less avid left hepatic lesion that is concerning for metastasis which was not seen on CT from 06/11/19. No other evidence of mets -His MRI abdomen from 08/04/19 shows 3 liver lesions (0.8-4.4cm) compatible with neuroendocrine metastatic lesions.  -He was seen by Dr. Aron and she did not recommend surgery due to the multiple liver mets.  -He was on monthly Lanreotide injections since 08/01/19. He was found to have progression on 05/31/20 DOTATATE PET scan, and received 4 doses of Lutathera  08/17/20 - 03/17/21. -restaging DOTATATE PET on 04/28/21 showed positive response to treatment. We discontinued his lanreotide injections after 05/13/21. -surveillance CT CAP on 04/18/22 showed stable disease -Due to persistent diarrhea, he restarted lanreotide injection in September 2023,  diarrhea has resolved.  Will continue monthly. -Restaging CT scan in April 2024 showed overall stable disease, and improved liver metastasis.  -Repeated CT scan from June 24, 2023 was done without IV contrast, his liver metastasis felt to be improved, however he has developed multiple pelvic adenopathy.  Repeated dotatate PET scan in July 2025 showed stable liver mets, no node or other distant mets.  -continue monthly Lanreotide injections

## 2024-03-25 NOTE — Telephone Encounter (Signed)
 Please review refill request and advise.   Thanks. Dm/cma

## 2024-03-26 ENCOUNTER — Other Ambulatory Visit: Payer: Self-pay

## 2024-03-26 ENCOUNTER — Inpatient Hospital Stay (HOSPITAL_BASED_OUTPATIENT_CLINIC_OR_DEPARTMENT_OTHER): Admitting: Hematology

## 2024-03-26 ENCOUNTER — Inpatient Hospital Stay

## 2024-03-26 ENCOUNTER — Inpatient Hospital Stay: Attending: Hematology

## 2024-03-26 ENCOUNTER — Other Ambulatory Visit: Payer: Self-pay | Admitting: *Deleted

## 2024-03-26 VITALS — BP 122/70 | HR 72 | Temp 98.2°F | Resp 16 | Ht 69.0 in | Wt 257.8 lb

## 2024-03-26 DIAGNOSIS — C7A011 Malignant carcinoid tumor of the jejunum: Secondary | ICD-10-CM | POA: Diagnosis present

## 2024-03-26 DIAGNOSIS — D3A098 Benign carcinoid tumors of other sites: Secondary | ICD-10-CM

## 2024-03-26 DIAGNOSIS — C7B02 Secondary carcinoid tumors of liver: Secondary | ICD-10-CM | POA: Insufficient documentation

## 2024-03-26 DIAGNOSIS — D3A011 Benign carcinoid tumor of the jejunum: Secondary | ICD-10-CM

## 2024-03-26 DIAGNOSIS — L989 Disorder of the skin and subcutaneous tissue, unspecified: Secondary | ICD-10-CM

## 2024-03-26 LAB — CBC WITH DIFFERENTIAL (CANCER CENTER ONLY)
Abs Immature Granulocytes: 0.01 K/uL (ref 0.00–0.07)
Basophils Absolute: 0.1 K/uL (ref 0.0–0.1)
Basophils Relative: 1 %
Eosinophils Absolute: 0.5 K/uL (ref 0.0–0.5)
Eosinophils Relative: 7 %
HCT: 42.6 % (ref 39.0–52.0)
Hemoglobin: 14 g/dL (ref 13.0–17.0)
Immature Granulocytes: 0 %
Lymphocytes Relative: 20 %
Lymphs Abs: 1.5 K/uL (ref 0.7–4.0)
MCH: 28.9 pg (ref 26.0–34.0)
MCHC: 32.9 g/dL (ref 30.0–36.0)
MCV: 88 fL (ref 80.0–100.0)
Monocytes Absolute: 0.6 K/uL (ref 0.1–1.0)
Monocytes Relative: 8 %
Neutro Abs: 4.6 K/uL (ref 1.7–7.7)
Neutrophils Relative %: 64 %
Platelet Count: 293 K/uL (ref 150–400)
RBC: 4.84 MIL/uL (ref 4.22–5.81)
RDW: 13.2 % (ref 11.5–15.5)
WBC Count: 7.3 K/uL (ref 4.0–10.5)
nRBC: 0 % (ref 0.0–0.2)

## 2024-03-26 LAB — CMP (CANCER CENTER ONLY)
ALT: 18 U/L (ref 0–44)
AST: 14 U/L — ABNORMAL LOW (ref 15–41)
Albumin: 4.4 g/dL (ref 3.5–5.0)
Alkaline Phosphatase: 73 U/L (ref 38–126)
Anion gap: 9 (ref 5–15)
BUN: 25 mg/dL — ABNORMAL HIGH (ref 6–20)
CO2: 30 mmol/L (ref 22–32)
Calcium: 9.5 mg/dL (ref 8.9–10.3)
Chloride: 102 mmol/L (ref 98–111)
Creatinine: 1.46 mg/dL — ABNORMAL HIGH (ref 0.61–1.24)
GFR, Estimated: 56 mL/min — ABNORMAL LOW (ref >60)
Glucose, Bld: 170 mg/dL — ABNORMAL HIGH (ref 70–99)
Potassium: 3.9 mmol/L (ref 3.5–5.1)
Sodium: 141 mmol/L (ref 135–145)
Total Bilirubin: 0.6 mg/dL (ref 0.0–1.2)
Total Protein: 7.8 g/dL (ref 6.5–8.1)

## 2024-03-26 MED ORDER — LANREOTIDE ACETATE 120 MG/0.5ML ~~LOC~~ SOLN
120.0000 mg | Freq: Once | SUBCUTANEOUS | Status: AC
  Administered 2024-03-26: 120 mg via SUBCUTANEOUS
  Filled 2024-03-26: qty 120

## 2024-03-26 NOTE — Progress Notes (Signed)
 Dwayne Sawyer   [phone]) [phone] Fax:([phone]) [phone]   Clinic Follow up Note   Patient Care Team: Dwayne Garnette HERO, MD as PCP - General (Family Medicine) Dwayne Callander, MD as Consulting Physician (Hematology) Dwayne Kay HERO, MD as Attending Physician (Orthopedic Surgery) Dwayne Riggs, MD as Attending Physician (Cardiology) [facility] as Attending Physician (Radiology) Dwayne Artist RAMAN, MD as Consulting Physician (Sports Medicine) Dwayne Sawyer BROCKS, MD as Referring Physician (Urology)  Date of Service:  03/26/2024  CHIEF COMPLAINT: f/u of small bowel carcinoid tumor  CURRENT THERAPY:  Lanreotide injection every 4 weeks  Oncology History   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 Pathology confirmed well differentiated neuroendocrine tumor, with metastasis to 14 of 32 LNs. Margins clear.  -07/16/19 PET shows two right hepatic lesion which are intensely hypermetabolic consistent with metastatic NET. There is also a smaller less avid left hepatic lesion that is concerning for metastasis which was not seen on CT from 06/11/19. No other evidence of mets -Dwayne Sawyer MRI abdomen from 08/04/19 shows 3 liver lesions (0.8-4.4cm) compatible with neuroendocrine metastatic lesions.  -He was seen by Dr. Aron and she did not recommend surgery due to the multiple liver mets.  -He was on monthly Lanreotide injections since 08/01/19. He was found to have progression on 05/31/20 DOTATATE PET scan, and received 4 doses of Lutathera  08/17/20 - 03/17/21. -restaging DOTATATE PET on 04/28/21 showed positive response to treatment. We discontinued Dwayne Sawyer lanreotide injections after 05/13/21. -surveillance CT CAP on 04/18/22 showed stable disease -Due to persistent diarrhea, he restarted lanreotide injection in September  2023,  diarrhea has resolved.  Will continue monthly. -Restaging CT scan in April 2024 showed overall stable disease, and improved liver metastasis.  -Repeated CT scan from June 24, 2023 was done without IV contrast, Dwayne Sawyer liver metastasis felt to be improved, however he has developed multiple pelvic adenopathy.  Repeated dotatate PET scan in July 2025 showed stable liver mets, no node or other distant mets.  -continue monthly Lanreotide injections   Assessment & Plan Metastatic neuroendocrine tumor with liver involvement The metastatic neuroendocrine tumor in the liver is well-managed. The right hepatic lobe lesion has decreased in size from 6 cm to 4.9 cm, although the SUV has increased slightly. The small lesion in the left hepatic lobe remains unchanged in size and SUV. No new symptoms such as pain, nausea, or bloating are present. Tumor markers are normal. Current treatment with injections is effective. - Continue current injection therapy. - Consider repeat Dwayne Sawyer treatment if liver lesion progresses further. - Repeat PET scan and CT in 6 to 12 months.  Skin lesions  Multiple cutaneous lesions, including a dark spot on the skin and a lesion on the ear, raise concern for possible skin cancer. He has had difficulty obtaining a timely dermatology appointment. - Refer to [facility] for evaluation of cutaneous lesions. - Provide contact information for [facility] to schedule an appointment. - Document cutaneous lesions with photographs in the medical chart.  Plan - I personally reviewed Dwayne Sawyer restaging dotatate PET scan from last months, which overall showed stable disease - He is clinically doing well, will continue lanreotide injection every 4 weeks - Follow-up in 3 months with lab - Dermatology referral per patient's request   SUMMARY OF ONCOLOGIC HISTORY: Oncology History Overview Note  Cancer Staging No  matching staging information was found for the  patient.    Carcinoid tumor of small intestine  06/11/2019 Imaging   US  Abdomen 06/11/19  IMPRESSION: 1. There is a solid mass in the right lobe of the liver which based on current measurements may have enlarged since the prior study from 2015. Images from the previous MR at that time cannot be retrieved. Given this circumstance, it may be prudent to correlate with pre and serial post-contrast MR or CT of the liver to further evaluate. There is underlying diffuse increase in liver echogenicity, a finding indicative of hepatic steatosis.   2. Multiple loops of fluid-filled bowel. Question a degree of ileus or enteritis.   3. Study otherwise unremarkable. Note that much of the common bile duct is obscured by gas.    06/11/2019 Imaging   CT AP W Contrast 06/11/19   IMPRESSION: 1. High-grade small bowel obstruction secondary to desmoplastic response in the central right abdominal mesentery centered on a 4 cm calcified irregular/spiculated soft tissue lesion. Abrupt small bowel transition zone is identified immediately lead adjacent to this mesenteric lesion and multiple adjacent bowel loops are tethered into this region with mesenteric edema/congestion. The loop of small bowel immediately proximal to the transition zone shows mild circumferential wall thickening but no pneumatosis. 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 2. Small lymph nodes in the abnormal right mesentery suggest additional metastatic involvement. 3. Heterogeneous liver parenchyma, likely secondary to geographic fatty deposition. There is a focal 14 mm low-density lesion in the right liver. The patient had an MRI in the [facility] system on 08/09/2013 to evaluate a right liver lesion, but those images are not available. Follow-up MRI without and with contrast recommended to exclude metastatic disease. 4. Tiny sclerotic foci in  the T12 vertebral body in both femoral heads are likely benign, but close attention on follow-up recommended. 5.  Aortic Atherosclerois (ICD10-170.0)      06/12/2019 Surgery    EXPLORATORY LAPAROTOMY WITH SMALL BOWEL RESECTION by Dr. Gladis  06/12/19    06/12/2019 Initial Biopsy   FINAL MICROSCOPIC DIAGNOSIS: 06/12/19 -  Well-differentiated neuroendocrine tumor, 5.0 cm  -  Tumor invades adjacent loops of small bowel  -  Perineural invasion and extensive lymphovascular space invasion  -  Metastatic neuroendocrine tumor involving fourteen of thirty-two  lymph nodes (14/32)  -  Margins uninvolved by neoplasm  -  See oncology table and comment below    06/13/2019 Initial Diagnosis   Carcinoid tumor of intestine producing obstruction--resected Nov 2020   07/16/2019 PET scan   IMPRESSION: 1. Interval resection small bowel and mesenteric mass with no evidence residual mesenteric or small bowel neuroendocrine tumor. 2. Large lesion in RIGHT hepatic lobe with intense radiotracer activity consistent with well differentiated neuroendocrine tumor metastasis. Smaller lesion in the anterior LEFT hepatic lobe is concerning for second hepatic metastasis. 3. Pulmonary nodule measuring 6 mm in LEFT upper lobe is not have radiotracer activity. Smaller RIGHT upper lobe nodule. Recommend close attention on follow-up.   08/01/2019 - 05/07/2020 Chemotherapy   Lanreotide injection monthly starting 08/01/19. Stopped after 05/07/20 due to disease progression     08/04/2019 Imaging   MRI abdomen  IMPRESSION: 1. The previously noted lesion in the right lobe of the liver has grown slightly compared to 2015. In addition, today's study demonstrates 2 smaller lesions with similar imaging characteristics. Given the activity on the recent PET Dotatate scan, these lesions are all compatible with  small neuroendocrine metastatic lesions. 2. Hepatic steatosis.   02/10/2020 Imaging   MRI abdomen   IMPRESSION: 1. The dominant right hepatic lobe mass has mildly increased in size compared to the prior exam. This currently measures 5.4 by 4.5 cm, previously 4.9 by 4.2 cm. 2. Nine additional small T2 hyperintense foci in the liver are stable, and while the small size makes these less specific, there likely small metastatic lesions. Today's exam has the benefit of less motion artifact compared to the 06/03/2020 MRI, and I was able to pick at each of these tiny lesions on the prior exam in retrospect. It is conceivable that 1 or more of these tiny lesions could represent small hemangiomas, although I do not observe classic delayed enhancement pattern. 3. Diffuse hepatic steatosis.   05/31/2020 PET scan   DOTATATE PET  IMPRESSION: 1. Progression of well differentiated neuroendocrine tumor hepatic metastasis with increase in size and radiotracer activity of dominant lesion in the LEFT hepatic lobe and multiple small lesions as described above. 2. New small focus of radiotracer activity within the head of the pancreas. 3. Increased hepatic steatosis.      Chemotherapy   Lutathera  treatments on 08/11/20, 10/06/20, 12/01/20, 01/26/21      04/28/2021 PET scan   NETSPOT  GA 68 DOTATATE  IMPRESSION: 1. Interval decrease in radiotracer activity of neuroendocrine tumor hepatic metastasis. Dominant lesion the RIGHT hepatic lobe is mildly in size. Several small peripheral lesions now have no radiotracer activity. Lesions are conspicuous in liver on noncontrast exam due to hepatic steatosis. 2. Small lesion in the head of the pancreas ahs decreased radiotracer activity. Lesion difficult to define on CT portion exam. 3. No evidence new metastatic neuroendocrine tumor. 4. Post RIGHT hemicolectomy anatomy without complication 5. New bilateral symmetric gynecomastia.      Discussed the use of AI scribe software for clinical note transcription with the patient, who gave verbal consent to  proceed.  History of Present Illness Dwayne Sawyer is a 53 year old male with metastatic neuroendocrine tumor who presents for follow-up.  The largest lesion in the right hepatic lobe has decreased in size from 6 cm to 4.9 cm, but the SUV has increased. The smaller lesion in the left hepatic lobe remains unchanged in size and activity. He has undergone previous treatments, including dotatate therapy, and has had PET scans before and after these treatments. Tumor marker levels are normal, and he has recently submitted a urine test.  Diarrhea is manageable and occurs occasionally when he eats certain foods. No stomach pain, nausea, or bloating.  He is concerned about new dark spots on Dwayne Sawyer skin, which he has observed growing. He has had difficulty securing a timely appointment with a dermatologist, with previous appointments being canceled and rescheduled far in the future.     All other systems were reviewed with the patient and are negative.  MEDICAL HISTORY:  Past Medical History:  Diagnosis Date   Arthritis    Cancer (HCC) 2020   liver   Diabetes mellitus without complication (HCC)    type 2- pt was told he was pre-diabetic   Family history of carcinoid tumor    High cholesterol    Hypertension     SURGICAL HISTORY: Past Surgical History:  Procedure Laterality Date   EYE SURGERY Right    pt says we he was a baby surgery had to be done on one eye because it wasn't as strong as the other   HERNIA REPAIR  1988  inguinal- pt doesn't remember which side   LAPAROTOMY N/A 06/12/2019   Procedure: EXPLORATORY LAPAROTOMY WITH SMALL BOWEL RESECTION;  Surgeon: Gladis Cough, MD;  Location: [facility];  Service: General;  Laterality: N/A;   SHOULDER SURGERY Right 2001   rotator cuff repair   SMALL INTESTINE SURGERY  06/12/2019   part of ex lap procedure. pt states 3 feet of intestine was removed   TONSILLECTOMY     as a child   TOTAL KNEE ARTHROPLASTY Right 09/07/2021   Procedure:  RIGHT TOTAL KNEE ARTHROPLASTY;  Surgeon: Dwayne Kay HERO, MD;  Location: [facility] OR;  Service: Orthopedics;  Laterality: Right;    I have reviewed the social history and family history with the patient and they are unchanged from previous note.  ALLERGIES:  has no known allergies.  MEDICATIONS:  Current Outpatient Medications  Medication Sig Dispense Refill   amLODipine  (NORVASC ) 10 MG tablet Take 1 tablet (10 mg total) by mouth daily. 30 tablet 11   atorvastatin  (LIPITOR) 40 MG tablet Take 1 tablet (40 mg total) by mouth daily. 90 tablet 0   carvedilol  (COREG ) 25 MG tablet Take 1 tablet (25 mg total) by mouth 2 (two) times daily with a meal. 60 tablet 11   chlorthalidone  (HYGROTON ) 25 MG tablet Take 1 tablet (25 mg total) by mouth daily. 30 tablet 11   diclofenac  (VOLTAREN ) 75 MG EC tablet Take 1 tablet (75 mg total) by mouth 2 (two) times daily as needed. 60 tablet 1   finasteride  (PROSCAR ) 5 MG tablet Take 1 tablet (5 mg total) by mouth daily. 30 tablet 11   LANREOTIDE ACETATE   Inject 120 mg into the skin daily.     metFORMIN  (GLUCOPHAGE ) 500 MG tablet Take 1 tablet (500 mg total) by mouth 2 (two) times daily with a meal. 180 tablet 3   tadalafil  (CIALIS ) 20 MG tablet Take 0.5-1 tablets (10-20 mg total) by mouth daily as needed for erectile dysfunction. 10 tablet 11   tamsulosin  (FLOMAX ) 0.4 MG CAPS capsule Take 1 capsule (0.4 mg total) by mouth daily. 30 capsule 11   valsartan  (DIOVAN ) 320 MG tablet Take 1 tablet (320 mg total) by mouth daily. 30 tablet 11   No current facility-administered medications for this visit.    PHYSICAL EXAMINATION: ECOG PERFORMANCE STATUS: 0 - Asymptomatic  Vitals:   03/26/24 0946  BP: 122/70  Pulse: 72  Resp: 16  Temp: 98.2 F (36.8 C)  SpO2: 97%   Wt Readings from Last 3 Encounters:  03/26/24 257 lb 12.8 oz (116.9 kg)  03/07/24 256 lb (116.1 kg)  01/22/24 258 lb 6.4 oz (117.2 kg)     GENERAL:alert, no distress and comfortable SKIN: skin color,  texture, turgor are normal, there are several skin lesions on Dwayne Sawyer forehead and the scalp with hyperpigmentation EYES: normal, Conjunctiva are pink and non-injected, sclera clear NECK: supple, thyroid normal size, non-tender, without nodularity LYMPH:  no palpable lymphadenopathy in the cervical, axillary  LUNGS: clear to auscultation and percussion with normal breathing effort HEART: regular rate & rhythm and no murmurs and no lower extremity edema ABDOMEN:abdomen soft, non-tender and normal bowel sounds Musculoskeletal:no cyanosis of digits and no clubbing  NEURO: alert & oriented x 3 with fluent speech, no focal motor/sensory deficits  Physical Exam    LABORATORY DATA:  I have reviewed the data as listed    Latest Ref Rng & Units 03/26/2024    8:47 AM 01/02/2024    8:59 AM 07/04/2023   10:11 AM  CBC  WBC 4.0 - 10.5 K/uL 7.3  7.8  7.4   Hemoglobin 13.0 - 17.0 g/dL 85.9  85.9  87.3   Hematocrit 39.0 - 52.0 % 42.6  41.3  38.5   Platelets 150 - 400 K/uL 293  305  380         Latest Ref Rng & Units 03/26/2024    8:47 AM 01/22/2024    1:58 PM 01/02/2024    8:59 AM  CMP  Glucose 70 - 99 mg/dL 829  883  853   BUN 6 - 20 mg/dL 25  30  31    Creatinine 0.61 - 1.24 mg/dL 8.53  8.43  8.40   Sodium 135 - 145 mmol/L 141  138  138   Potassium 3.5 - 5.1 mmol/L 3.9  3.6  4.3   Chloride 98 - 111 mmol/L 102  99  100   CO2 22 - 32 mmol/L 30  30  31    Calcium  8.9 - 10.3 mg/dL 9.5  9.8  9.7   Total Protein 6.5 - 8.1 g/dL 7.8   7.6   Total Bilirubin 0.0 - 1.2 mg/dL 0.6   0.5   Alkaline Phos 38 - 126 U/L 73   82   AST 15 - 41 U/L 14   11   ALT 0 - 44 U/L 18   16       RADIOGRAPHIC STUDIES: I have personally reviewed the radiological images as listed and agreed with the findings in the report. No results found.    Orders Placed This Encounter  Procedures   Ambulatory referral to Dermatology    Referral Priority:   Routine    Referral Type:   Consultation    Referral Reason:   Specialty  Services Required    Requested Specialty:   Dermatology    Number of Visits Requested:   1   All questions were answered. The patient knows to call the clinic with any problems, questions or concerns. No barriers to learning was detected. The total time spent in the appointment was 30 minutes, including review of chart and various tests results, discussions about plan of care and coordination of care plan     Onita Mattock, MD 03/26/2024

## 2024-03-27 ENCOUNTER — Encounter: Payer: Self-pay | Admitting: Dermatology

## 2024-03-27 ENCOUNTER — Ambulatory Visit (INDEPENDENT_AMBULATORY_CARE_PROVIDER_SITE_OTHER): Admitting: Dermatology

## 2024-03-27 VITALS — BP 144/98 | HR 71

## 2024-03-27 DIAGNOSIS — L821 Other seborrheic keratosis: Secondary | ICD-10-CM | POA: Diagnosis not present

## 2024-03-27 DIAGNOSIS — L570 Actinic keratosis: Secondary | ICD-10-CM | POA: Diagnosis not present

## 2024-03-27 DIAGNOSIS — W908XXA Exposure to other nonionizing radiation, initial encounter: Secondary | ICD-10-CM

## 2024-03-27 LAB — CHROMOGRANIN A: Chromogranin A (ng/mL): 58.6 ng/mL (ref 0.0–101.8)

## 2024-03-27 NOTE — Patient Instructions (Signed)

## 2024-03-27 NOTE — Progress Notes (Signed)
 New Patient Visit   Subjective  Dwayne Sawyer is a 53 y.o. male who presents for the following: Evaluation of multiple lesions on the scalp and right ear.   Patient was referred by his PCP to a dermatologist who scheduled him 9 months out, and then that appointment was cancelled and they rescheduled for another 9 months. He was then referred to us  from the cancer center.   Patient states that there are some spots on the scalp that are tender. The spot on the ear has fallen off and come back.   Patient is currently being treated with a chemotherapy injection for a small intestine carcinoid.   The following portions of the chart were reviewed this encounter and updated as appropriate: medications, allergies, medical history  Review of Systems:  No other skin or systemic complaints except as noted in HPI or Assessment and Plan.  Objective  Well appearing patient in no apparent distress; mood and affect are within normal limits.  A focused examination was performed of the following areas: Scalp Face Ears  Relevant exam findings are noted in the Assessment and Plan.  Right Superior Crus of Antihelix  Left Forehead, Mid Parietal Scalp Erythematous thin papules/macules with gritty scale.   Assessment & Plan   SEBORRHEIC KERATOSIS- right ear - Stuck-on, waxy, tan-brown papules and/or plaques  - Benign-appearing - Discussed benign etiology and prognosis. - Observe - Call for any changes  ACTINIC KERATOSIS Exam: Erythematous thin papules/macules with gritty scale  Actinic keratoses are precancerous spots that appear secondary to cumulative UV radiation exposure/sun exposure over time. They are chronic with expected duration over 1 year. A portion of actinic keratoses will progress to squamous cell carcinoma of the skin. It is not possible to reliably predict which spots will progress to skin cancer and so treatment is recommended to prevent development of skin cancer.  Recommend  daily broad spectrum sunscreen SPF 30+ to sun-exposed areas, reapply every 2 hours as needed.  Recommend staying in the shade or wearing long sleeves, sun glasses (UVA+UVB protection) and wide brim hats (4-inch brim around the entire circumference of the hat). Call for new or changing lesions.  Treatment Plan:  Prior to procedure, discussed risks of blister formation, small wound, skin dyspigmentation, or rare scar following cryotherapy. Recommend Vaseline ointment to treated areas while healing.  Destruction Procedure Note Destruction method: cryotherapy   Informed consent: discussed and consent obtained   Lesion destroyed using liquid nitrogen: Yes   Outcome: patient tolerated procedure well with no complications   Post-procedure details: wound care instructions given   Locations: left forehead and mid parietal scalp # of Lesions Treated: 2   SEBORRHEIC KERATOSIS Right Superior Crus of Antihelix AK (ACTINIC KERATOSIS) (2) Left Forehead, Mid Parietal Scalp Destruction of lesion - Left Forehead, Mid Parietal Scalp Complexity: simple   Destruction method: cryotherapy   Informed consent: discussed and consent obtained   Timeout:  patient name, date of birth, surgical site, and procedure verified Lesion destroyed using liquid nitrogen: Yes   Region frozen until ice ball extended beyond lesion: Yes   Outcome: patient tolerated procedure well with no complications   Post-procedure details: wound care instructions given     Return in about 2 months (around 05/27/2024) for [place] f/u.  LILLETTE Rollene Gobble, RN, am acting as scribe for RUFUS CHRISTELLA HOLY, MD .   Documentation: I have reviewed the above documentation for accuracy and completeness, and I agree with the above.  RUFUS CHRISTELLA HOLY, MD

## 2024-03-28 LAB — 5 HIAA, QUANTITATIVE, URINE, 24 HOUR
5-HIAA, Ur: 6.1 mg/L
5-HIAA,Quant.,24 Hr Urine: 6.1 mg/(24.h) (ref 0.0–14.9)
Total Volume: 1000

## 2024-04-23 ENCOUNTER — Inpatient Hospital Stay: Attending: Hematology

## 2024-04-23 ENCOUNTER — Ambulatory Visit: Admitting: Family Medicine

## 2024-04-23 VITALS — BP 124/92 | HR 71 | Temp 99.0°F | Resp 18

## 2024-04-23 DIAGNOSIS — C7A011 Malignant carcinoid tumor of the jejunum: Secondary | ICD-10-CM | POA: Insufficient documentation

## 2024-04-23 DIAGNOSIS — R197 Diarrhea, unspecified: Secondary | ICD-10-CM | POA: Insufficient documentation

## 2024-04-23 DIAGNOSIS — C7B02 Secondary carcinoid tumors of liver: Secondary | ICD-10-CM | POA: Diagnosis not present

## 2024-04-23 DIAGNOSIS — C7B01 Secondary carcinoid tumors of distant lymph nodes: Secondary | ICD-10-CM | POA: Diagnosis not present

## 2024-04-23 DIAGNOSIS — D3A011 Benign carcinoid tumor of the jejunum: Secondary | ICD-10-CM

## 2024-04-23 MED ORDER — LANREOTIDE ACETATE 120 MG/0.5ML ~~LOC~~ SOLN
120.0000 mg | Freq: Once | SUBCUTANEOUS | Status: AC
  Administered 2024-04-23: 120 mg via SUBCUTANEOUS
  Filled 2024-04-23: qty 120

## 2024-05-02 ENCOUNTER — Ambulatory Visit: Admitting: Family Medicine

## 2024-05-20 ENCOUNTER — Telehealth: Payer: Self-pay | Admitting: Family Medicine

## 2024-05-20 NOTE — Telephone Encounter (Signed)
 10/01/2023 and 05/02/2024 no shows  Final warning sent via mail and mychart

## 2024-05-21 ENCOUNTER — Inpatient Hospital Stay

## 2024-05-29 ENCOUNTER — Ambulatory Visit: Admitting: Dermatology

## 2024-05-30 ENCOUNTER — Ambulatory Visit: Admitting: Family Medicine

## 2024-06-17 NOTE — Assessment & Plan Note (Signed)
 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 Pathology confirmed well differentiated neuroendocrine tumor, with metastasis to 14 of 32 LNs. Margins clear.  -07/16/19 PET shows two right hepatic lesion which are intensely hypermetabolic consistent with metastatic NET. There is also a smaller less avid left hepatic lesion that is concerning for metastasis which was not seen on CT from 06/11/19. No other evidence of mets -His MRI abdomen from 08/04/19 shows 3 liver lesions (0.8-4.4cm) compatible with neuroendocrine metastatic lesions.  -He was seen by Dr. Aron and she did not recommend surgery due to the multiple liver mets.  -He was on monthly Lanreotide injections since 08/01/19. He was found to have progression on 05/31/20 DOTATATE PET scan, and received 4 doses of Lutathera  08/17/20 - 03/17/21. -restaging DOTATATE PET on 04/28/21 showed positive response to treatment. We discontinued his lanreotide injections after 05/13/21. -surveillance CT CAP on 04/18/22 showed stable disease -Due to persistent diarrhea, he restarted lanreotide injection in September 2023,  diarrhea has resolved.  Will continue monthly. -Restaging CT scan in April 2024 showed overall stable disease, and improved liver metastasis.  -Repeated CT scan from June 24, 2023 was done without IV contrast, his liver metastasis felt to be improved, however he has developed multiple pelvic adenopathy.  Repeated dotatate PET scan in July 2025 showed stable liver mets, no node or other distant mets.  -continue monthly Lanreotide injections

## 2024-06-18 ENCOUNTER — Inpatient Hospital Stay

## 2024-06-18 ENCOUNTER — Inpatient Hospital Stay (HOSPITAL_BASED_OUTPATIENT_CLINIC_OR_DEPARTMENT_OTHER): Admitting: Hematology

## 2024-06-18 ENCOUNTER — Inpatient Hospital Stay: Attending: Hematology

## 2024-06-18 VITALS — BP 127/84 | HR 67 | Temp 97.7°F | Resp 17 | Wt 253.3 lb

## 2024-06-18 DIAGNOSIS — C7B02 Secondary carcinoid tumors of liver: Secondary | ICD-10-CM | POA: Diagnosis not present

## 2024-06-18 DIAGNOSIS — C7A011 Malignant carcinoid tumor of the jejunum: Secondary | ICD-10-CM | POA: Diagnosis present

## 2024-06-18 DIAGNOSIS — D3A011 Benign carcinoid tumor of the jejunum: Secondary | ICD-10-CM

## 2024-06-18 LAB — CBC WITH DIFFERENTIAL (CANCER CENTER ONLY)
Abs Immature Granulocytes: 0.01 K/uL (ref 0.00–0.07)
Basophils Absolute: 0.1 K/uL (ref 0.0–0.1)
Basophils Relative: 1 %
Eosinophils Absolute: 0.3 K/uL (ref 0.0–0.5)
Eosinophils Relative: 4 %
HCT: 39.8 % (ref 39.0–52.0)
Hemoglobin: 13.2 g/dL (ref 13.0–17.0)
Immature Granulocytes: 0 %
Lymphocytes Relative: 16 %
Lymphs Abs: 1.2 K/uL (ref 0.7–4.0)
MCH: 29.3 pg (ref 26.0–34.0)
MCHC: 33.2 g/dL (ref 30.0–36.0)
MCV: 88.2 fL (ref 80.0–100.0)
Monocytes Absolute: 0.6 K/uL (ref 0.1–1.0)
Monocytes Relative: 8 %
Neutro Abs: 5.3 K/uL (ref 1.7–7.7)
Neutrophils Relative %: 71 %
Platelet Count: 325 K/uL (ref 150–400)
RBC: 4.51 MIL/uL (ref 4.22–5.81)
RDW: 13.8 % (ref 11.5–15.5)
WBC Count: 7.5 K/uL (ref 4.0–10.5)
nRBC: 0 % (ref 0.0–0.2)

## 2024-06-18 LAB — CMP (CANCER CENTER ONLY)
ALT: 33 U/L (ref 0–44)
AST: 29 U/L (ref 15–41)
Albumin: 4.7 g/dL (ref 3.5–5.0)
Alkaline Phosphatase: 91 U/L (ref 38–126)
Anion gap: 12 (ref 5–15)
BUN: 32 mg/dL — ABNORMAL HIGH (ref 6–20)
CO2: 26 mmol/L (ref 22–32)
Calcium: 9.8 mg/dL (ref 8.9–10.3)
Chloride: 102 mmol/L (ref 98–111)
Creatinine: 1.99 mg/dL — ABNORMAL HIGH (ref 0.61–1.24)
GFR, Estimated: 39 mL/min — ABNORMAL LOW (ref >60)
Glucose, Bld: 232 mg/dL — ABNORMAL HIGH (ref 70–99)
Potassium: 3.8 mmol/L (ref 3.5–5.1)
Sodium: 140 mmol/L (ref 135–145)
Total Bilirubin: 0.5 mg/dL (ref 0.0–1.2)
Total Protein: 7.8 g/dL (ref 6.5–8.1)

## 2024-06-18 MED ORDER — LANREOTIDE ACETATE 120 MG/0.5ML ~~LOC~~ SOLN
120.0000 mg | Freq: Once | SUBCUTANEOUS | Status: AC
  Administered 2024-06-18: 120 mg via SUBCUTANEOUS
  Filled 2024-06-18: qty 0.5

## 2024-06-18 NOTE — Progress Notes (Signed)
 [facility]   Telephone:([phone] Fax:(336) [phone]   Clinic Follow up Note   Patient Care Team: Thedora Garnette HERO, MD as PCP - General (Family Medicine) Lanny Callander, MD as Consulting Physician (Hematology) Jerri Kay HERO, MD as Attending Physician (Orthopedic Surgery) Raford Riggs, MD as Attending Physician (Cardiology) [facility] as Attending Physician (Radiology) Joane Artist RAMAN, MD as Consulting Physician (Sports Medicine) Shona Layman BROCKS, MD as Referring Physician (Urology)  Date of Service:  06/18/2024  CHIEF COMPLAINT: f/u of metastatic neuroendocrine tumor  CURRENT THERAPY:  Lanreotide injection every 4 weeks  Oncology History   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 Pathology confirmed well differentiated neuroendocrine tumor, with metastasis to 14 of 32 LNs. Margins clear.  -07/16/19 PET shows two right hepatic lesion which are intensely hypermetabolic consistent with metastatic NET. There is also a smaller less avid left hepatic lesion that is concerning for metastasis which was not seen on CT from 06/11/19. No other evidence of mets -His MRI abdomen from 08/04/19 shows 3 liver lesions (0.8-4.4cm) compatible with neuroendocrine metastatic lesions.  -He was seen by Dr. Aron and she did not recommend surgery due to the multiple liver mets.  -He was on monthly Lanreotide injections since 08/01/19. He was found to have progression on 05/31/20 DOTATATE PET scan, and received 4 doses of Lutathera  08/17/20 - 03/17/21. -restaging DOTATATE PET on 04/28/21 showed positive response to treatment. We discontinued his lanreotide injections after 05/13/21. -surveillance CT CAP on 04/18/22 showed stable disease -Due to persistent diarrhea, he restarted lanreotide injection in  September 2023,  diarrhea has resolved.  Will continue monthly. -Restaging CT scan in April 2024 showed overall stable disease, and improved liver metastasis.  -Repeated CT scan from June 24, 2023 was done without IV contrast, his liver metastasis felt to be improved, however he has developed multiple pelvic adenopathy.  Repeated dotatate PET scan in July 2025 showed stable liver mets, no node or other distant mets.  -continue monthly Lanreotide injections   Assessment & Plan Metastatic neuroendocrine tumor of the jejunum The neuroendocrine tumor of the jejunum is well-managed with no new symptoms such as abdominal pain, bloating, or diarrhea. Blood counts are normal, and previous urine tests of 5-HIAA have been normal for the past five years. The last scan in July showed stability. - Scheduled monthly injections every four weeks. - Scheduled follow-up appointments every three months. - Ordered scan on next visit      SUMMARY OF ONCOLOGIC HISTORY: Oncology History Overview Note  Cancer Staging No matching staging information was found for the patient.    Carcinoid tumor of small intestine (HCC)  06/11/2019 Imaging   US  Abdomen 06/11/19  IMPRESSION: 1. There is a solid mass in the right lobe of the liver which based on current measurements may have enlarged since the prior study from 2015. Images from the previous MR at that time cannot be retrieved. Given this circumstance, it may be prudent to correlate with pre and serial post-contrast MR or CT of the liver to further evaluate. There is underlying diffuse increase in liver echogenicity, a finding indicative of hepatic steatosis.   2. Multiple loops of fluid-filled bowel. Question a degree of ileus or enteritis.   3. Study otherwise unremarkable. Note that much of the common bile duct is obscured by gas.  06/11/2019 Imaging   CT AP W Contrast 06/11/19   IMPRESSION: 1. High-grade small bowel obstruction secondary to  desmoplastic response in the central right abdominal mesentery centered on a 4 cm calcified irregular/spiculated soft tissue lesion. Abrupt small bowel transition zone is identified immediately lead adjacent to this mesenteric lesion and multiple adjacent bowel loops are tethered into this region with mesenteric edema/congestion. The loop of small bowel immediately proximal to the transition zone shows mild circumferential wall thickening but no pneumatosis. 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 2. Small lymph nodes in the abnormal right mesentery suggest additional metastatic involvement. 3. Heterogeneous liver parenchyma, likely secondary to geographic fatty deposition. There is a focal 14 mm low-density lesion in the right liver. The patient had an MRI in the [facility] system on 08/09/2013 to evaluate a right liver lesion, but those images are not available. Follow-up MRI without and with contrast recommended to exclude metastatic disease. 4. Tiny sclerotic foci in the T12 vertebral body in both femoral heads are likely benign, but close attention on follow-up recommended. 5.  Aortic Atherosclerois (ICD10-170.0)      06/12/2019 Surgery    EXPLORATORY LAPAROTOMY WITH SMALL BOWEL RESECTION by Dr. Gladis  06/12/19    06/12/2019 Initial Biopsy   FINAL MICROSCOPIC DIAGNOSIS: 06/12/19 -  Well-differentiated neuroendocrine tumor, 5.0 cm  -  Tumor invades adjacent loops of small bowel  -  Perineural invasion and extensive lymphovascular space invasion  -  Metastatic neuroendocrine tumor involving fourteen of thirty-two  lymph nodes (14/32)  -  Margins uninvolved by neoplasm  -  See oncology table and comment below    06/13/2019 Initial Diagnosis   Carcinoid tumor of intestine producing obstruction--resected Nov 2020   07/16/2019 PET scan   IMPRESSION: 1. Interval resection small bowel and  mesenteric mass with no evidence residual mesenteric or small bowel neuroendocrine tumor. 2. Large lesion in RIGHT hepatic lobe with intense radiotracer activity consistent with well differentiated neuroendocrine tumor metastasis. Smaller lesion in the anterior LEFT hepatic lobe is concerning for second hepatic metastasis. 3. Pulmonary nodule measuring 6 mm in LEFT upper lobe is not have radiotracer activity. Smaller RIGHT upper lobe nodule. Recommend close attention on follow-up.   08/01/2019 - 05/07/2020 Chemotherapy   Lanreotide injection monthly starting 08/01/19. Stopped after 05/07/20 due to disease progression     08/04/2019 Imaging   MRI abdomen  IMPRESSION: 1. The previously noted lesion in the right lobe of the liver has grown slightly compared to 2015. In addition, today's study demonstrates 2 smaller lesions with similar imaging characteristics. Given the activity on the recent PET Dotatate scan, these lesions are all compatible with small neuroendocrine metastatic lesions. 2. Hepatic steatosis.   02/10/2020 Imaging   MRI abdomen  IMPRESSION: 1. The dominant right hepatic lobe mass has mildly increased in size compared to the prior exam. This currently measures 5.4 by 4.5 cm, previously 4.9 by 4.2 cm. 2. Nine additional small T2 hyperintense foci in the liver are stable, and while the small size makes these less specific, there likely small metastatic lesions. Today's exam has the benefit of less motion artifact compared to the 06/03/2020 MRI, and I was able to pick at each of these tiny lesions on the prior exam in retrospect. It is conceivable that 1 or more of these tiny lesions could represent small hemangiomas, although I do not observe classic delayed enhancement pattern. 3. Diffuse hepatic steatosis.   05/31/2020 PET scan  DOTATATE PET  IMPRESSION: 1. Progression of well differentiated neuroendocrine tumor hepatic metastasis with increase in size and radiotracer  activity of dominant lesion in the LEFT hepatic lobe and multiple small lesions as described above. 2. New small focus of radiotracer activity within the head of the pancreas. 3. Increased hepatic steatosis.      Chemotherapy   Lutathera  treatments on 08/11/20, 10/06/20, 12/01/20, 01/26/21      04/28/2021 PET scan   NETSPOT  GA 68 DOTATATE  IMPRESSION: 1. Interval decrease in radiotracer activity of neuroendocrine tumor hepatic metastasis. Dominant lesion the RIGHT hepatic lobe is mildly in size. Several small peripheral lesions now have no radiotracer activity. Lesions are conspicuous in liver on noncontrast exam due to hepatic steatosis. 2. Small lesion in the head of the pancreas ahs decreased radiotracer activity. Lesion difficult to define on CT portion exam. 3. No evidence new metastatic neuroendocrine tumor. 4. Post RIGHT hemicolectomy anatomy without complication 5. New bilateral symmetric gynecomastia.      Discussed the use of AI scribe software for clinical note transcription with the patient, who gave verbal consent to proceed.  History of Present Illness Dwayne Sawyer is a 53 year old male with a neuroendocrine tumor who presents for follow-up.  He reports no new symptoms since his visit three months ago, including no abdominal pain, bloating, diarrhea, cough, or chest discomfort. His appetite is normal.  He is not taking oral medications for his tumor. He receives an injection every four weeks, with the next dose due today, and has had no adverse effects.  His last scan was in July.     All other systems were reviewed with the patient and are negative.  MEDICAL HISTORY:  Past Medical History:  Diagnosis Date   Arthritis    Cancer (HCC) 2020   liver   Diabetes mellitus without complication (HCC)    type 2- pt was told he was pre-diabetic   Family history of carcinoid tumor    High cholesterol    Hypertension     SURGICAL HISTORY: Past Surgical History:   Procedure Laterality Date   EYE SURGERY Right    pt says we he was a baby surgery had to be done on one eye because it wasn't as strong as the other   HERNIA REPAIR  1988   inguinal- pt doesn't remember which side   LAPAROTOMY N/A 06/12/2019   Procedure: EXPLORATORY LAPAROTOMY WITH SMALL BOWEL RESECTION;  Surgeon: Gladis Cough, MD;  Location: [facility];  Service: General;  Laterality: N/A;   SHOULDER SURGERY Right 2001   rotator cuff repair   SMALL INTESTINE SURGERY  06/12/2019   part of ex lap procedure. pt states 3 feet of intestine was removed   TONSILLECTOMY     as a child   TOTAL KNEE ARTHROPLASTY Right 09/07/2021   Procedure: RIGHT TOTAL KNEE ARTHROPLASTY;  Surgeon: Jerri Kay HERO, MD;  Location: [facility] OR;  Service: Orthopedics;  Laterality: Right;    I have reviewed the social history and family history with the patient and they are unchanged from previous note.  ALLERGIES:  has no known allergies.  MEDICATIONS:  Current Outpatient Medications  Medication Sig Dispense Refill   amLODipine  (NORVASC ) 10 MG tablet Take 1 tablet (10 mg total) by mouth daily. 30 tablet 11   atorvastatin  (LIPITOR) 40 MG tablet Take 1 tablet (40 mg total) by mouth daily. 90 tablet 0   carvedilol  (COREG ) 25 MG tablet Take 1 tablet (25 mg total) by mouth 2 (  two) times daily with a meal. 60 tablet 11   chlorthalidone  (HYGROTON ) 25 MG tablet Take 1 tablet (25 mg total) by mouth daily. 30 tablet 11   diclofenac  (VOLTAREN ) 75 MG EC tablet Take 1 tablet (75 mg total) by mouth 2 (two) times daily as needed. 60 tablet 1   finasteride  (PROSCAR ) 5 MG tablet Take 1 tablet (5 mg total) by mouth daily. 30 tablet 11   LANREOTIDE ACETATE  [place] Inject 120 mg into the skin daily.     metFORMIN  (GLUCOPHAGE ) 500 MG tablet Take 1 tablet (500 mg total) by mouth 2 (two) times daily with a meal. 180 tablet 3   tadalafil  (CIALIS ) 20 MG tablet Take 0.5-1 tablets (10-20 mg total) by mouth daily as needed for erectile dysfunction.  10 tablet 11   tamsulosin  (FLOMAX ) 0.4 MG CAPS capsule Take 1 capsule (0.4 mg total) by mouth daily. 30 capsule 11   valsartan  (DIOVAN ) 320 MG tablet Take 1 tablet (320 mg total) by mouth daily. 30 tablet 11   No current facility-administered medications for this visit.    PHYSICAL EXAMINATION: ECOG PERFORMANCE STATUS: 0 - Asymptomatic  Vitals:   06/18/24 1046  BP: 127/84  Pulse: 67  Resp: 17  Temp: 97.7 F (36.5 C)  SpO2: 100%   Wt Readings from Last 3 Encounters:  06/18/24 253 lb 4.8 oz (114.9 kg)  03/26/24 257 lb 12.8 oz (116.9 kg)  03/07/24 256 lb (116.1 kg)     GENERAL:alert, no distress and comfortable SKIN: skin color, texture, turgor are normal, no rashes or significant lesions EYES: normal, Conjunctiva are pink and non-injected, sclera clear Musculoskeletal:no cyanosis of digits and no clubbing  NEURO: alert & oriented x 3 with fluent speech, no focal motor/sensory deficits  Physical Exam    LABORATORY DATA:  I have reviewed the data as listed    Latest Ref Rng & Units 06/18/2024   10:36 AM 03/26/2024    8:47 AM 01/02/2024    8:59 AM  CBC  WBC 4.0 - 10.5 K/uL 7.5  7.3  7.8   Hemoglobin 13.0 - 17.0 g/dL 86.7  85.9  85.9   Hematocrit 39.0 - 52.0 % 39.8  42.6  41.3   Platelets 150 - 400 K/uL 325  293  305         Latest Ref Rng & Units 06/18/2024   10:36 AM 03/26/2024    8:47 AM 01/22/2024    1:58 PM  CMP  Glucose 70 - 99 mg/dL 767  829  883   BUN 6 - 20 mg/dL 32  25  30   Creatinine 0.61 - 1.24 mg/dL 8.00  8.53  8.43   Sodium 135 - 145 mmol/L 140  141  138   Potassium 3.5 - 5.1 mmol/L 3.8  3.9  3.6   Chloride 98 - 111 mmol/L 102  102  99   CO2 22 - 32 mmol/L 26  30  30    Calcium  8.9 - 10.3 mg/dL 9.8  9.5  9.8   Total Protein 6.5 - 8.1 g/dL 7.8  7.8    Total Bilirubin 0.0 - 1.2 mg/dL 0.5  0.6    Alkaline Phos 38 - 126 U/L 91  73    AST 15 - 41 U/L 29  14    ALT 0 - 44 U/L 33  18        RADIOGRAPHIC STUDIES: I have personally reviewed the  radiological images as listed and agreed with the findings in the  report. No results found.    No orders of the defined types were placed in this encounter.  All questions were answered. The patient knows to call the clinic with any problems, questions or concerns. No barriers to learning was detected. The total time spent in the appointment was 15 minutes, including review of chart and various tests results, discussions about plan of care and coordination of care plan     Onita Mattock, MD 06/18/2024

## 2024-06-23 ENCOUNTER — Other Ambulatory Visit: Payer: Self-pay | Admitting: Family Medicine

## 2024-06-23 DIAGNOSIS — E782 Mixed hyperlipidemia: Secondary | ICD-10-CM

## 2024-06-23 LAB — CHROMOGRANIN A: Chromogranin A (ng/mL): 66.3 ng/mL (ref 0.0–101.8)

## 2024-07-02 ENCOUNTER — Encounter: Payer: Self-pay | Admitting: Family Medicine

## 2024-07-02 ENCOUNTER — Encounter: Payer: Self-pay | Admitting: Oncology

## 2024-07-02 ENCOUNTER — Ambulatory Visit (INDEPENDENT_AMBULATORY_CARE_PROVIDER_SITE_OTHER): Admitting: Family Medicine

## 2024-07-02 VITALS — BP 124/74 | HR 74 | Temp 98.1°F | Ht 69.0 in | Wt 248.2 lb

## 2024-07-02 DIAGNOSIS — E119 Type 2 diabetes mellitus without complications: Secondary | ICD-10-CM | POA: Diagnosis not present

## 2024-07-02 DIAGNOSIS — B353 Tinea pedis: Secondary | ICD-10-CM | POA: Diagnosis not present

## 2024-07-02 DIAGNOSIS — M1711 Unilateral primary osteoarthritis, right knee: Secondary | ICD-10-CM | POA: Diagnosis not present

## 2024-07-02 DIAGNOSIS — Z7984 Long term (current) use of oral hypoglycemic drugs: Secondary | ICD-10-CM

## 2024-07-02 DIAGNOSIS — E782 Mixed hyperlipidemia: Secondary | ICD-10-CM | POA: Diagnosis not present

## 2024-07-02 DIAGNOSIS — E1122 Type 2 diabetes mellitus with diabetic chronic kidney disease: Secondary | ICD-10-CM

## 2024-07-02 DIAGNOSIS — N1832 Chronic kidney disease, stage 3b: Secondary | ICD-10-CM | POA: Insufficient documentation

## 2024-07-02 DIAGNOSIS — I1A0 Resistant hypertension: Secondary | ICD-10-CM | POA: Diagnosis not present

## 2024-07-02 MED ORDER — DICLOFENAC SODIUM 75 MG PO TBEC: 75.0000 mg | DELAYED_RELEASE_TABLET | Freq: Two times a day (BID) | ORAL | 1 refills | Status: DC | PRN

## 2024-07-02 MED ORDER — OZEMPIC (0.25 OR 0.5 MG/DOSE) 2 MG/3ML ~~LOC~~ SOPN: 0.2500 mg | PEN_INJECTOR | SUBCUTANEOUS | 2 refills | Status: DC

## 2024-07-02 NOTE — Assessment & Plan Note (Signed)
 Blood pressure is at goal today. Continue amlodipine  10 mg daily, carvedilol  25 mg bid, valsartan  320 mg daily and chlorthalidone  25 mg daily.

## 2024-07-02 NOTE — Assessment & Plan Note (Addendum)
 LDL cholesterol is at goal. Continue atorvastatin  40 mg daily.

## 2024-07-02 NOTE — Assessment & Plan Note (Addendum)
 Continue metformin  500 mg daily. I will try re-prescribing semaglutide  (Ozempic ) 0.25 mg weekly. Plan to step up to 0.5 mg weekly after first month.

## 2024-07-02 NOTE — Assessment & Plan Note (Signed)
 Discussed concern over renal labs. Recommend focus on blood pressure and glucose control, adequate hydration, and avoidance of nephrotoxic medications. Currently on an ARB.

## 2024-07-02 NOTE — Assessment & Plan Note (Signed)
 Knee pain has been exacerbated by new job. I recommend use of Tylenol  Arthritis 650 mg qid. Discussed avoiding NSAIDs due to chronic kidney disease.

## 2024-07-02 NOTE — Progress Notes (Signed)
 [facility] [address] [place] [address] Dept: [phone] Dept Fax: [phone]  Chronic Care Office Visit  Subjective:    Patient ID: Dwayne Sawyer, male    DOB: November 10, 1970, 53 y.o..   MRN: [id number]  Chief Complaint  Patient presents with   Hypertension    3 month f/u HTN.  No concerns.  Declines flu shot.    History of Present Illness:  Patient is in today for reassessment of chronic medical conditions.   Mr. Sailors has a history of resistant hypertension. They are currently managed on amlodipine  10 mg daily, carvedilol  25 mg bid, valsartan  320 mg daily and chlorthalidone  25 mg daily.    Mr. Costabile has a history of hyperlipidemia. He is  managed on atorvastatin  40 mg daily.   Mr. Brizzi has a history of Type 2 diabetes. He is managed on metformin  500 mg daily. We had attempted this summer to prescribe Ozempic , but his insurance denied coverage. He notes he now does have insurance that should cover this. He has been more active and has been able to lose weight on his own. He is hopeful that Ozempic  will assist further int his, while also helping control his diabetes better.   Mr. Lofstrom has a history of metastatic carcinoid tumors involving the small intestine and liver. He is currently in remission. He is feeling well overall. He continues lanreotide for management of diarrhea.   Past Medical History: Patient Active Problem List   Diagnosis Date Noted   Annual physical exam 01/22/2024   Non-binary gender 06/18/2023   Elevated PSA, greater than or equal to 20 ng/ml 06/18/2023   Erectile dysfunction 11/21/2022   Morbid obesity, associated with hypertension, hyperlipidemia, and osteoarthritis (HCC) 09/15/2021   Status post total right knee replacement 09/07/2021   BMI 37.0-37.9, adult 12/10/2020   Obstructive sleep apnea syndrome, severe 11/29/2020   Hyperlipidemia 09/06/2020   Resistant hypertension  09/06/2020   Type 2 diabetes mellitus (HCC) 09/06/2020   Carcinoid tumor of small intestine (HCC) 06/13/2019   Rheumatoid arthritis involving multiple sites with positive rheumatoid factor (HCC) 10/13/2015   Liver lesion 10/13/2015   Past Surgical History:  Procedure Laterality Date   EYE SURGERY Right    pt says we he was a baby surgery had to be done on one eye because it wasn't as strong as the other   HERNIA REPAIR  1988   inguinal- pt doesn't remember which side   LAPAROTOMY N/A 06/12/2019   Procedure: EXPLORATORY LAPAROTOMY WITH SMALL BOWEL RESECTION;  Surgeon: Gladis Cough, MD;  Location: [facility];  Service: General;  Laterality: N/A;   SHOULDER SURGERY Right 2001   rotator cuff repair   SMALL INTESTINE SURGERY  06/12/2019   part of ex lap procedure. pt states 3 feet of intestine was removed   TONSILLECTOMY     as a child   TOTAL KNEE ARTHROPLASTY Right 09/07/2021   Procedure: RIGHT TOTAL KNEE ARTHROPLASTY;  Surgeon: Jerri Kay HERO, MD;  Location: [facility] OR;  Service: Orthopedics;  Laterality: Right;   Family History  Problem Relation Age of Onset   Cancer Father        widespread carcinoid cancer   Hypertension Mother    Hyperlipidemia Mother    Outpatient Medications Prior to Visit  Medication Sig Dispense Refill   amLODipine  (NORVASC ) 10 MG tablet Take 1 tablet (10 mg total) by mouth daily. 30 tablet 11   atorvastatin  (LIPITOR) 40 MG tablet TAKE ONE TABLET  BY MOUTH ONE TIME DAILY 90 tablet 3   carvedilol  (COREG ) 25 MG tablet Take 1 tablet (25 mg total) by mouth 2 (two) times daily with a meal. 60 tablet 11   chlorthalidone  (HYGROTON ) 25 MG tablet Take 1 tablet (25 mg total) by mouth daily. 30 tablet 11   diclofenac  (VOLTAREN ) 75 MG EC tablet Take 1 tablet (75 mg total) by mouth 2 (two) times daily as needed. 60 tablet 1   finasteride  (PROSCAR ) 5 MG tablet Take 1 tablet (5 mg total) by mouth daily. 30 tablet 11   LANREOTIDE ACETATE  [place] Inject 120 mg into the skin daily.      metFORMIN  (GLUCOPHAGE ) 500 MG tablet Take 1 tablet (500 mg total) by mouth 2 (two) times daily with a meal. 180 tablet 3   tadalafil  (CIALIS ) 20 MG tablet Take 0.5-1 tablets (10-20 mg total) by mouth daily as needed for erectile dysfunction. 10 tablet 11   tamsulosin  (FLOMAX ) 0.4 MG CAPS capsule Take 1 capsule (0.4 mg total) by mouth daily. 30 capsule 11   valsartan  (DIOVAN ) 320 MG tablet Take 1 tablet (320 mg total) by mouth daily. 30 tablet 11   No facility-administered medications prior to visit.   No Known Allergies   Objective:   Today's Vitals   07/02/24 1530  BP: 124/74  Pulse: 74  Temp: 98.1 F (36.7 C)  TempSrc: Temporal  SpO2: 96%  Weight: 248 lb 3.2 oz (112.6 kg)  Height: 5' 9 (1.753 m)   Body mass index is 36.65 kg/m.   General: Well developed, well nourished. No acute distress. Feet- Skin intact. Mild maceration between toes of left foot. A few nails show some subungual bruising related to   wearing steel-toed shoes. Dorsalis pedis and posterior tibial artery pulses are normal. 5.07 monofilament testing   normal. Psych: Alert and oriented. Normal mood and affect.  Health Maintenance Due  Topic Date Due   Hepatitis B Vaccines 19-59 Average Risk (1 of 3 - 19+ 3-dose series) Never done   OPHTHALMOLOGY EXAM  11/23/2022   Colonoscopy  07/25/2023   FOOT EXAM  08/23/2023   Influenza Vaccine  Never done   Lab Results    Lab Results  Component Value Date   HGBA1C 7.4 (H) 01/22/2024   Lipid Panel:  Lab Results  Component Value Date   CHOL 186 01/22/2024   HDL 36.80 (L) 01/22/2024   LDLCALC 99 01/22/2024   TRIG 250.0 (H) 01/22/2024      Latest Ref Rng & Units 06/18/2024   10:36 AM 03/26/2024    8:47 AM 01/22/2024    1:58 PM  CMP  Glucose 70 - 99 mg/dL 767  829  883   BUN 6 - 20 mg/dL 32  25  30   Creatinine 0.61 - 1.24 mg/dL 8.00  8.53  8.43   Sodium 135 - 145 mmol/L 140  141  138   Potassium 3.5 - 5.1 mmol/L 3.8  3.9  3.6   Chloride 98 - 111 mmol/L  102  102  99   CO2 22 - 32 mmol/L 26  30  30    Calcium  8.9 - 10.3 mg/dL 9.8  9.5  9.8   Total Protein 6.5 - 8.1 g/dL 7.8  7.8    Total Bilirubin 0.0 - 1.2 mg/dL 0.5  0.6    Alkaline Phos 38 - 126 U/L 91  73    AST 15 - 41 U/L 29  14    ALT 0 - 44 U/L  33  18      Assessment & Plan:   Problem List Items Addressed This Visit       Cardiovascular and Mediastinum   Resistant hypertension - Primary   Blood pressure is at goal today. Continue amlodipine  10 mg daily, carvedilol  25 mg bid, valsartan  320 mg daily and chlorthalidone  25 mg daily.        Endocrine   Type 2 diabetes mellitus with stage 3b chronic kidney disease (HCC)   Continue metformin  500 mg daily. I will try re-prescribing semaglutide  (Ozempic ) 0.25 mg weekly. Plan to step up to 0.5 mg weekly after first month.      Relevant Medications   Semaglutide ,0.25 or 0.5MG /DOS, (OZEMPIC , 0.25 OR 0.5 MG/DOSE,) 2 MG/3ML SOPN     Musculoskeletal and Integument   Primary osteoarthritis of right knee   Knee pain has been exacerbated by new job. I recommend use of Tylenol  Arthritis 650 mg qid. Discussed avoiding NSAIDs due to chronic kidney disease.        Genitourinary   CKD stage 3b, GFR 30-44 ml/min (HCC)   Discussed concern over renal labs. Recommend focus on blood pressure and glucose control, adequate hydration, and avoidance of nephrotoxic medications. Currently on an ARB.         Other   Hyperlipidemia   LDL cholesterol is at goal. Continue atorvastatin  40 mg daily      Other Visit Diagnoses       Long term current use of oral hypoglycemic drug         Tinea pedis of left foot       Recommend use of an OTC antifungal cream.       Return in about 2 months (around 09/02/2024) for Reassessment.   Garnette CHRISTELLA Simpler, MD  I,Emily Lagle,acting as a scribe for Garnette CHRISTELLA Simpler, MD.,have documented all relevant documentation on the behalf of Garnette CHRISTELLA Simpler, MD.  I, Garnette CHRISTELLA Simpler, MD, have reviewed all documentation  for this visit. The documentation on 07/02/2024 for the exam, diagnosis, procedures, and orders are all accurate and complete.

## 2024-07-03 ENCOUNTER — Ambulatory Visit: Payer: Self-pay | Admitting: Family Medicine

## 2024-07-03 LAB — HEMOGLOBIN A1C: Hgb A1c MFr Bld: 8.3 % — ABNORMAL HIGH (ref 4.6–6.5)

## 2024-07-03 LAB — GLUCOSE, RANDOM: Glucose, Bld: 122 mg/dL — ABNORMAL HIGH (ref 70–99)

## 2024-07-07 ENCOUNTER — Telehealth: Payer: Self-pay

## 2024-07-07 ENCOUNTER — Encounter: Payer: Self-pay | Admitting: Family Medicine

## 2024-07-07 ENCOUNTER — Other Ambulatory Visit (HOSPITAL_COMMUNITY): Payer: Self-pay

## 2024-07-07 ENCOUNTER — Encounter: Payer: Self-pay | Admitting: Oncology

## 2024-07-07 NOTE — Telephone Encounter (Signed)
 Pt requested via pt advice request for p/a for Rx OZEMPIC , 0.25 OR 0.5 MG/DOSE,) 2 MG/3ML via primary Ins     Pharmacy Patient Advocate Encounter   Received notification from Patient Advice Request messages that prior authorization for OZEMPIC , 0.25 OR 0.5 MG/DOSE,) 2 MG/3ML  is required/requested.   Insurance verification completed.   The patient is insured through [facility].   Per test claim: PA required; PA submitted to above mentioned insurance via Prompt PA Key/confirmation #/EOC [id number] Status is pending

## 2024-07-07 NOTE — Telephone Encounter (Signed)
 Pharmacy Patient Advocate Encounter   Received notification from Physician's Office that prior authorization for Ozempic  (0.25 or 0.5 MG/DOSE) 2MG /3ML pen-injectors  is required/requested.   Insurance verification completed.   The patient is insured through [facility] MEDICAID.   Per test claim: PA required; PA submitted to above mentioned insurance via Latent Key/confirmation #/EOC [id number] Status is pending

## 2024-07-07 NOTE — Telephone Encounter (Signed)
 Can you please and thank you do the PA for the medication? Thanks .Dm/cma

## 2024-07-07 NOTE — Telephone Encounter (Signed)
 Pharmacy Patient Advocate Encounter  Received notification from [facility] MEDICAID that Prior Authorization for Ozempic  2 mg/3ml pen injectors has been APPROVED from 07/07/2024 to 07/07/2025. Ran test claim, Copay is $0. This test claim was processed through [facility]- copay amounts may vary at other pharmacies due to pharmacy/plan contracts, or as the patient moves through the different stages of their insurance plan.   PA #/Case ID/Reference #: [id number]

## 2024-07-08 ENCOUNTER — Other Ambulatory Visit (HOSPITAL_COMMUNITY): Payer: Self-pay

## 2024-07-08 ENCOUNTER — Encounter: Payer: Self-pay | Admitting: Oncology

## 2024-07-08 NOTE — Telephone Encounter (Signed)
 Pharmacy Patient Advocate Encounter  Received notification from [employer] that Prior Authorization for  OZEMPIC , 0.25 OR 0.5 MG/DOSE,) 2 MG/3ML  has been APPROED. Ran test claim, Copay is $0.00. This test claim was processed through [facility]- copay amounts may vary at other pharmacies due to pharmacy/plan contracts, or as the patient moves through the different stages of their insurance plan.

## 2024-07-22 ENCOUNTER — Telehealth: Payer: Self-pay

## 2024-07-22 NOTE — Telephone Encounter (Signed)
 LVM for pt stating that our Revenue Cycle Team has been informed by pt's insurance [employer] for 2026 fiscal year that Lanre[facility] and [facility] are out of network.  Requested for pt to give Dr Demetra office a call regarding this matter so a referral can be sent to an oncologist and hospital w/in the pt's insurance network so there will be no delay in pt's care.  Awaiting return call from pt.  Pt returned call and said the this happened last year with insurance as well with Dr Lanny being out of network.  Pt stated that all his other [facility] providers are w/in network with [employer] and does not understand why Dr Lanny is out of network.  Pt stated 2025 he was getting Lanreotide Aacetate from the Mfg with their pt assistance program and wants to know if that has expired as well.  Stated this nurse will check with our IV Chemo Pharmacist to see if he needs to renew his Mfg assistance.  Stated this nurse will let the pt know once I hear back from them.  Pt verbalized understanding and had no further questions or concerns.

## 2024-07-22 NOTE — Telephone Encounter (Signed)
 Returned call to pt after speaking with Vernell Bruns CPHT, Alcus Gaskins, and Wells Pouch via Secure Chat regarding pt's Lanreotide injection and care at [facility].  Left LVM stating Per Vernell, the pt does not qualify for Mfg assistance drug d/t pt's insurance [employer] states that [facility] is out of network.  Stated that since [facility] is out of network with [employer], we will have to cancel the pt's appts here at [facility].  Instructed pt to contact [employer] to find out what oncology provider and hospital is within their network.  Instructed pt to let Dr Demetra office know what oncologist and he would like the referral to be sent so there is no further delay in the pt's care.  Alcus Gaskins and Wells Pouch confirmed that CHARON is stating that [facility] is out of network for [place].  Awaiting a return call from the pt.

## 2024-07-25 ENCOUNTER — Inpatient Hospital Stay

## 2024-08-04 ENCOUNTER — Encounter: Payer: Self-pay | Admitting: Family Medicine

## 2024-08-04 DIAGNOSIS — E119 Type 2 diabetes mellitus without complications: Secondary | ICD-10-CM

## 2024-08-04 NOTE — Telephone Encounter (Signed)
 Please review patient message and adivse.  Thanks.  Dm/cma

## 2024-08-05 MED ORDER — OZEMPIC (0.25 OR 0.5 MG/DOSE) 2 MG/3ML ~~LOC~~ SOPN: 0.5000 mg | PEN_INJECTOR | SUBCUTANEOUS | 2 refills | Status: AC

## 2024-08-05 MED ORDER — OZEMPIC (0.25 OR 0.5 MG/DOSE) 2 MG/3ML ~~LOC~~ SOPN: 0.5000 mg | PEN_INJECTOR | SUBCUTANEOUS | 2 refills | Status: DC

## 2024-08-05 NOTE — Addendum Note (Signed)
 Addended by: THEDORA GARNETTE HERO on: 08/05/2024 08:26 AM   Modules accepted: Orders

## 2024-08-05 NOTE — Addendum Note (Signed)
 Addended by: KYM KARNA CROME on: 08/05/2024 10:09 AM   Modules accepted: Orders

## 2024-08-29 ENCOUNTER — Inpatient Hospital Stay

## 2024-09-02 ENCOUNTER — Ambulatory Visit: Admitting: Family Medicine

## 2024-09-08 ENCOUNTER — Ambulatory Visit: Admitting: Urology

## 2024-09-25 ENCOUNTER — Inpatient Hospital Stay

## 2024-09-25 ENCOUNTER — Inpatient Hospital Stay: Admitting: Hematology

## 2024-09-25 ENCOUNTER — Ambulatory Visit: Admitting: Urology
